# Patient Record
Sex: Female | Born: 1935 | Race: White | Hispanic: No | Smoking: Former smoker
Health system: Southern US, Community
[De-identification: ages and names within clinical notes are randomized; demographics above are authoritative.]

## PROBLEM LIST (undated history)

## (undated) DIAGNOSIS — I639 Cerebral infarction, unspecified: Secondary | ICD-10-CM

## (undated) DIAGNOSIS — I739 Peripheral vascular disease, unspecified: Secondary | ICD-10-CM

## (undated) DIAGNOSIS — I10 Essential (primary) hypertension: Secondary | ICD-10-CM

## (undated) DIAGNOSIS — E039 Hypothyroidism, unspecified: Secondary | ICD-10-CM

## (undated) DIAGNOSIS — H269 Unspecified cataract: Secondary | ICD-10-CM

## (undated) DIAGNOSIS — H547 Unspecified visual loss: Secondary | ICD-10-CM

## (undated) DIAGNOSIS — R0600 Dyspnea, unspecified: Secondary | ICD-10-CM

## (undated) DIAGNOSIS — H532 Diplopia: Secondary | ICD-10-CM

## (undated) DIAGNOSIS — J449 Chronic obstructive pulmonary disease, unspecified: Secondary | ICD-10-CM

## (undated) DIAGNOSIS — E785 Hyperlipidemia, unspecified: Secondary | ICD-10-CM

## (undated) DIAGNOSIS — R29810 Facial weakness: Secondary | ICD-10-CM

## (undated) DIAGNOSIS — Z8619 Personal history of other infectious and parasitic diseases: Secondary | ICD-10-CM

## (undated) HISTORY — DX: Unspecified visual loss: H54.7

## (undated) HISTORY — DX: Diplopia: H53.2

## (undated) HISTORY — DX: Hypothyroidism, unspecified: E03.9

## (undated) HISTORY — DX: Unspecified cataract: H26.9

## (undated) HISTORY — PX: APPENDECTOMY: SHX54

## (undated) HISTORY — DX: Facial weakness: R29.810

## (undated) HISTORY — PX: OTHER SURGICAL HISTORY: SHX169

## (undated) HISTORY — DX: Hyperlipidemia, unspecified: E78.5

## (undated) HISTORY — PX: BREAST LUMPECTOMY: SHX2

## (undated) HISTORY — DX: Essential (primary) hypertension: I10

## (undated) HISTORY — DX: Cerebral infarction, unspecified: I63.9

## (undated) HISTORY — PX: ABDOMINAL EXPLORATION SURGERY: SHX538

## (undated) HISTORY — DX: Personal history of other infectious and parasitic diseases: Z86.19

## (undated) HISTORY — PX: DILATION AND CURETTAGE OF UTERUS: SHX78

## (undated) HISTORY — PX: LAMINECTOMY AND MICRODISCECTOMY SPINE: SHX1914

## (undated) HISTORY — PX: BACK SURGERY: SHX140

---

## 2000-08-25 ENCOUNTER — Other Ambulatory Visit: Admission: RE | Admit: 2000-08-25 | Discharge: 2000-08-25 | Payer: Self-pay | Admitting: Family Medicine

## 2001-10-31 ENCOUNTER — Other Ambulatory Visit: Admission: RE | Admit: 2001-10-31 | Discharge: 2001-10-31 | Payer: Self-pay | Admitting: Family Medicine

## 2003-04-22 ENCOUNTER — Encounter: Payer: Self-pay | Admitting: Family Medicine

## 2003-04-22 LAB — CONVERTED CEMR LAB: Pap Smear: NORMAL

## 2003-05-14 ENCOUNTER — Other Ambulatory Visit: Admission: RE | Admit: 2003-05-14 | Discharge: 2003-05-14 | Payer: Self-pay | Admitting: Family Medicine

## 2003-12-09 ENCOUNTER — Ambulatory Visit: Payer: Self-pay | Admitting: Family Medicine

## 2004-05-22 ENCOUNTER — Encounter: Payer: Self-pay | Admitting: Family Medicine

## 2004-05-22 LAB — CONVERTED CEMR LAB: Pap Smear: NORMAL

## 2004-05-24 ENCOUNTER — Ambulatory Visit: Payer: Self-pay | Admitting: Family Medicine

## 2004-05-26 ENCOUNTER — Ambulatory Visit: Payer: Self-pay | Admitting: Family Medicine

## 2004-05-26 ENCOUNTER — Other Ambulatory Visit: Admission: RE | Admit: 2004-05-26 | Discharge: 2004-05-26 | Payer: Self-pay | Admitting: Family Medicine

## 2004-11-21 LAB — FECAL OCCULT BLOOD, GUAIAC: Fecal Occult Blood: NEGATIVE

## 2004-11-25 ENCOUNTER — Ambulatory Visit: Payer: Self-pay | Admitting: Family Medicine

## 2004-12-13 ENCOUNTER — Ambulatory Visit: Payer: Self-pay | Admitting: Family Medicine

## 2004-12-16 ENCOUNTER — Ambulatory Visit: Payer: Self-pay | Admitting: Family Medicine

## 2005-05-22 ENCOUNTER — Encounter: Payer: Self-pay | Admitting: Family Medicine

## 2005-05-22 LAB — CONVERTED CEMR LAB: Pap Smear: NORMAL

## 2005-05-23 ENCOUNTER — Ambulatory Visit: Payer: Self-pay | Admitting: Family Medicine

## 2005-05-25 ENCOUNTER — Other Ambulatory Visit: Admission: RE | Admit: 2005-05-25 | Discharge: 2005-05-25 | Payer: Self-pay | Admitting: Family Medicine

## 2005-05-25 ENCOUNTER — Encounter: Payer: Self-pay | Admitting: Family Medicine

## 2005-05-25 ENCOUNTER — Ambulatory Visit: Payer: Self-pay | Admitting: Family Medicine

## 2005-10-04 ENCOUNTER — Ambulatory Visit: Payer: Self-pay | Admitting: Family Medicine

## 2005-12-21 ENCOUNTER — Ambulatory Visit: Payer: Self-pay | Admitting: Family Medicine

## 2006-05-23 ENCOUNTER — Encounter: Payer: Self-pay | Admitting: Family Medicine

## 2006-05-29 ENCOUNTER — Ambulatory Visit: Payer: Self-pay | Admitting: Family Medicine

## 2006-05-29 LAB — CONVERTED CEMR LAB
Albumin: 3.8 g/dL (ref 3.5–5.2)
Alkaline Phosphatase: 80 units/L (ref 39–117)
BUN: 9 mg/dL (ref 6–23)
Basophils Absolute: 0 10*3/uL (ref 0.0–0.1)
Cholesterol: 243 mg/dL (ref 0–200)
Creatinine, Ser: 0.8 mg/dL (ref 0.4–1.2)
Direct LDL: 134.9 mg/dL
Eosinophils Absolute: 0.2 10*3/uL (ref 0.0–0.6)
Free T4: 0.5 ng/dL — ABNORMAL LOW (ref 0.6–1.6)
GFR calc Af Amer: 91 mL/min
GFR calc non Af Amer: 75 mL/min
Hemoglobin: 15.9 g/dL — ABNORMAL HIGH (ref 12.0–15.0)
MCHC: 33.8 g/dL (ref 30.0–36.0)
MCV: 97.7 fL (ref 78.0–100.0)
Monocytes Absolute: 0.5 10*3/uL (ref 0.2–0.7)
Monocytes Relative: 9.4 % (ref 3.0–11.0)
Neutrophils Relative %: 58.1 % (ref 43.0–77.0)
Potassium: 4.4 meq/L (ref 3.5–5.1)
RDW: 12.3 % (ref 11.5–14.6)
Sodium: 141 meq/L (ref 135–145)
TSH: 14.35 microintl units/mL — ABNORMAL HIGH (ref 0.35–5.50)
Total Bilirubin: 0.8 mg/dL (ref 0.3–1.2)
Total CHOL/HDL Ratio: 3.1
Triglycerides: 111 mg/dL (ref 0–149)

## 2006-05-31 ENCOUNTER — Other Ambulatory Visit: Admission: RE | Admit: 2006-05-31 | Discharge: 2006-05-31 | Payer: Self-pay | Admitting: Family Medicine

## 2006-05-31 ENCOUNTER — Encounter: Payer: Self-pay | Admitting: Family Medicine

## 2006-05-31 ENCOUNTER — Ambulatory Visit: Payer: Self-pay | Admitting: Family Medicine

## 2006-07-18 ENCOUNTER — Ambulatory Visit: Payer: Self-pay | Admitting: Family Medicine

## 2006-07-18 DIAGNOSIS — I10 Essential (primary) hypertension: Secondary | ICD-10-CM | POA: Insufficient documentation

## 2006-08-22 ENCOUNTER — Encounter: Payer: Self-pay | Admitting: Family Medicine

## 2006-08-23 DIAGNOSIS — E785 Hyperlipidemia, unspecified: Secondary | ICD-10-CM

## 2006-08-23 DIAGNOSIS — E039 Hypothyroidism, unspecified: Secondary | ICD-10-CM | POA: Insufficient documentation

## 2006-08-23 DIAGNOSIS — E782 Mixed hyperlipidemia: Secondary | ICD-10-CM | POA: Insufficient documentation

## 2006-08-23 DIAGNOSIS — F17200 Nicotine dependence, unspecified, uncomplicated: Secondary | ICD-10-CM

## 2006-08-28 ENCOUNTER — Ambulatory Visit: Payer: Self-pay | Admitting: Family Medicine

## 2006-08-28 LAB — CONVERTED CEMR LAB
ALT: 15 units/L (ref 0–35)
Bilirubin, Direct: 0.1 mg/dL (ref 0.0–0.3)
CO2: 31 meq/L (ref 19–32)
Calcium: 9 mg/dL (ref 8.4–10.5)
Free T4: 1 ng/dL (ref 0.6–1.6)
GFR calc Af Amer: 80 mL/min
GFR calc non Af Amer: 66 mL/min
Glucose, Bld: 99 mg/dL (ref 70–99)
Potassium: 4.6 meq/L (ref 3.5–5.1)
Sodium: 143 meq/L (ref 135–145)
Total Protein: 6.6 g/dL (ref 6.0–8.3)

## 2006-08-30 ENCOUNTER — Ambulatory Visit: Payer: Self-pay | Admitting: Family Medicine

## 2006-11-30 ENCOUNTER — Ambulatory Visit: Payer: Self-pay | Admitting: Family Medicine

## 2007-06-04 ENCOUNTER — Encounter: Payer: Self-pay | Admitting: Family Medicine

## 2007-06-04 LAB — CONVERTED CEMR LAB
Albumin: 3.9 g/dL (ref 3.5–5.2)
BUN: 8 mg/dL (ref 6–23)
Calcium: 9.2 mg/dL (ref 8.4–10.5)
Cholesterol: 234 mg/dL (ref 0–200)
Direct LDL: 122.2 mg/dL
Eosinophils Absolute: 0.1 10*3/uL (ref 0.0–0.7)
Eosinophils Relative: 1.9 % (ref 0.0–5.0)
Free T4: 1.1 ng/dL (ref 0.6–1.6)
GFR calc Af Amer: 91 mL/min
GFR calc non Af Amer: 75 mL/min
Glucose, Bld: 79 mg/dL (ref 70–99)
HCT: 47.7 % — ABNORMAL HIGH (ref 36.0–46.0)
Hemoglobin: 15.8 g/dL — ABNORMAL HIGH (ref 12.0–15.0)
MCV: 95.9 fL (ref 78.0–100.0)
Monocytes Absolute: 0.4 10*3/uL (ref 0.1–1.0)
Monocytes Relative: 9.5 % (ref 3.0–12.0)
Neutro Abs: 2.4 10*3/uL (ref 1.4–7.7)
RDW: 12.3 % (ref 11.5–14.6)
TSH: 2.92 microintl units/mL (ref 0.35–5.50)
Total CHOL/HDL Ratio: 2.8
Total Protein: 6.7 g/dL (ref 6.0–8.3)
Triglycerides: 80 mg/dL (ref 0–149)

## 2007-06-07 ENCOUNTER — Ambulatory Visit: Payer: Self-pay | Admitting: Family Medicine

## 2007-06-07 DIAGNOSIS — G459 Transient cerebral ischemic attack, unspecified: Secondary | ICD-10-CM | POA: Insufficient documentation

## 2007-06-14 ENCOUNTER — Encounter: Payer: Self-pay | Admitting: Family Medicine

## 2007-06-14 ENCOUNTER — Ambulatory Visit: Payer: Self-pay | Admitting: Family Medicine

## 2007-06-14 ENCOUNTER — Other Ambulatory Visit: Admission: RE | Admit: 2007-06-14 | Discharge: 2007-06-14 | Payer: Self-pay | Admitting: Family Medicine

## 2007-06-14 LAB — CONVERTED CEMR LAB: Pap Smear: NORMAL

## 2007-06-19 ENCOUNTER — Encounter: Payer: Self-pay | Admitting: Family Medicine

## 2007-06-19 ENCOUNTER — Ambulatory Visit: Payer: Self-pay

## 2007-06-19 HISTORY — PX: DOPPLER ECHOCARDIOGRAPHY: SHX263

## 2007-06-19 HISTORY — PX: OTHER SURGICAL HISTORY: SHX169

## 2007-06-20 ENCOUNTER — Encounter (INDEPENDENT_AMBULATORY_CARE_PROVIDER_SITE_OTHER): Payer: Self-pay | Admitting: *Deleted

## 2007-07-18 ENCOUNTER — Ambulatory Visit: Payer: Self-pay | Admitting: Family Medicine

## 2007-09-19 ENCOUNTER — Ambulatory Visit: Payer: Self-pay | Admitting: Family Medicine

## 2008-09-23 ENCOUNTER — Ambulatory Visit: Payer: Self-pay | Admitting: Family Medicine

## 2008-09-23 LAB — CONVERTED CEMR LAB
ALT: 15 units/L (ref 0–35)
BUN: 13 mg/dL (ref 6–23)
CO2: 30 meq/L (ref 19–32)
Chloride: 107 meq/L (ref 96–112)
Eosinophils Absolute: 0.1 10*3/uL (ref 0.0–0.7)
Eosinophils Relative: 2.4 % (ref 0.0–5.0)
Free T4: 1.1 ng/dL (ref 0.6–1.6)
Glucose, Bld: 95 mg/dL (ref 70–99)
HCT: 48.9 % — ABNORMAL HIGH (ref 36.0–46.0)
Lymphs Abs: 1.3 10*3/uL (ref 0.7–4.0)
MCHC: 34.3 g/dL (ref 30.0–36.0)
MCV: 97.3 fL (ref 78.0–100.0)
Monocytes Absolute: 0.4 10*3/uL (ref 0.1–1.0)
Platelets: 175 10*3/uL (ref 150.0–400.0)
Potassium: 4.9 meq/L (ref 3.5–5.1)
RDW: 12.5 % (ref 11.5–14.6)
TSH: 2.46 microintl units/mL (ref 0.35–5.50)
Total Bilirubin: 0.8 mg/dL (ref 0.3–1.2)
WBC: 5.1 10*3/uL (ref 4.5–10.5)

## 2008-09-30 ENCOUNTER — Encounter: Payer: Self-pay | Admitting: Family Medicine

## 2008-09-30 ENCOUNTER — Other Ambulatory Visit: Admission: RE | Admit: 2008-09-30 | Discharge: 2008-09-30 | Payer: Self-pay | Admitting: Family Medicine

## 2008-09-30 ENCOUNTER — Ambulatory Visit: Payer: Self-pay | Admitting: Family Medicine

## 2008-09-30 LAB — CONVERTED CEMR LAB: Pap Smear: NORMAL

## 2008-09-30 LAB — HM PAP SMEAR

## 2008-10-02 ENCOUNTER — Encounter (INDEPENDENT_AMBULATORY_CARE_PROVIDER_SITE_OTHER): Payer: Self-pay | Admitting: *Deleted

## 2008-10-17 ENCOUNTER — Telehealth (INDEPENDENT_AMBULATORY_CARE_PROVIDER_SITE_OTHER): Payer: Self-pay | Admitting: Internal Medicine

## 2008-10-20 ENCOUNTER — Ambulatory Visit: Payer: Self-pay | Admitting: Family Medicine

## 2008-12-09 ENCOUNTER — Encounter: Payer: Self-pay | Admitting: Family Medicine

## 2008-12-16 ENCOUNTER — Ambulatory Visit: Payer: Self-pay | Admitting: Family Medicine

## 2008-12-16 DIAGNOSIS — J020 Streptococcal pharyngitis: Secondary | ICD-10-CM | POA: Insufficient documentation

## 2009-01-26 ENCOUNTER — Ambulatory Visit: Payer: Self-pay | Admitting: Family Medicine

## 2009-02-23 ENCOUNTER — Ambulatory Visit: Payer: Self-pay | Admitting: Family Medicine

## 2009-02-23 LAB — CONVERTED CEMR LAB: BUN: 7 mg/dL (ref 6–23)

## 2009-03-17 ENCOUNTER — Ambulatory Visit: Payer: Self-pay | Admitting: Family Medicine

## 2009-04-28 ENCOUNTER — Ambulatory Visit: Payer: Self-pay | Admitting: Family Medicine

## 2009-04-28 LAB — CONVERTED CEMR LAB
BUN: 12 mg/dL (ref 6–23)
Calcium: 9.2 mg/dL (ref 8.4–10.5)
Creatinine, Ser: 0.8 mg/dL (ref 0.4–1.2)
GFR calc non Af Amer: 74.74 mL/min (ref >60)

## 2009-06-25 ENCOUNTER — Telehealth: Payer: Self-pay | Admitting: Family Medicine

## 2009-09-23 ENCOUNTER — Encounter (INDEPENDENT_AMBULATORY_CARE_PROVIDER_SITE_OTHER): Payer: Self-pay | Admitting: *Deleted

## 2009-09-24 ENCOUNTER — Encounter (INDEPENDENT_AMBULATORY_CARE_PROVIDER_SITE_OTHER): Payer: Self-pay | Admitting: *Deleted

## 2009-12-15 ENCOUNTER — Encounter: Payer: Self-pay | Admitting: Family Medicine

## 2010-02-21 DIAGNOSIS — Z8619 Personal history of other infectious and parasitic diseases: Secondary | ICD-10-CM

## 2010-02-21 HISTORY — DX: Personal history of other infectious and parasitic diseases: Z86.19

## 2010-03-19 ENCOUNTER — Ambulatory Visit
Admission: RE | Admit: 2010-03-19 | Discharge: 2010-03-19 | Payer: Self-pay | Source: Home / Self Care | Attending: Family Medicine | Admitting: Family Medicine

## 2010-03-19 DIAGNOSIS — B029 Zoster without complications: Secondary | ICD-10-CM | POA: Insufficient documentation

## 2010-03-22 ENCOUNTER — Telehealth: Payer: Self-pay | Admitting: Family Medicine

## 2010-03-25 NOTE — Letter (Signed)
Summary: Nadara Eaton letter  [place] at [facility]  [address] [place], [place] [address]   Phone: [phone]  Fax: [phone]       09/23/2009 MRN: [id number]  [facility] [address] [place], [place]  [address]  Dear Ms. Woodell,  [facility] [place], and [place] announce the retirement of Arta Silence, M.D., from full-time practice at the [facility] office effective August 20, 2009 and his plans of returning part-time.  It is important to Dr. Hetty Ely and to our practice that you understand that [facility] has seven physicians in our office for your health care needs.  We will continue to offer the same exceptional care that you have today.    Dr. Hetty Ely has spoken to many of you about his plans for retirement and returning part-time in the fall.   We will continue to work with you through the transition to schedule appointments for you in the office and meet the high standards that [place] is committed to.   Again, it is with great pleasure that we share the news that Dr. Hetty Ely will return to [facility] at [facility] in October of 2011 with a reduced schedule.    If you have any questions, or would like to request an appointment with one of our physicians, please call us at [phone] and press the option for Scheduling an appointment.  We take pleasure in providing you with excellent patient care and look forward to seeing you at your next office visit.  Our [facility] Physicians are:  Tillman Abide, M.D. Laurita Quint, M.D. Roxy Manns, M.D. Kerby Nora, M.D. Hannah Beat, M.D. Ruthe Mannan, M.D. We proudly welcomed Raechel Ache, M.D. and Eustaquio Boyden, M.D. to the practice in July/August 2011.  Sincerely,   [facility]

## 2010-03-25 NOTE — Miscellaneous (Signed)
Summary: Flu vaccine  Clinical Lists Changes  Observations: Added new observation of FLU VAX: Historical (11/27/2009 16:40)      Influenza Immunization History:    Influenza # 1:  Historical (11/27/2009) Received form from [facility], [address] [place], [place]

## 2010-03-25 NOTE — Progress Notes (Signed)
Summary: Rx Metoprolol  Phone Note Refill Request Call back at [phone] Message from:  [facility] on Jun 25, 2009 11:26 AM  Received refill request for Metoprolol tar 100mg , take one every day. Does not match med sheet. Med sheet shows 1 every am and 1/2 every pm. Office note on 04/28/09 shows 1/2 every am and 1 at night. Please advise.   Method Requested: Electronic Initial call taken by: Sydell Axon LPN,  Jun 26, 4538 11:30 AM  Follow-up for Phone Call        May refill for one year. She should take the 1/2 the same time she is taking her Enalapril and the whole the opposite time to that for two times a day. Follow-up by: Shaune Leeks MD,  Jun 25, 2009 1:31 PM  Additional Follow-up for Phone Call Additional follow up Details #1::        Pt is taking one half in the morning along with her enalapril and one whole one at night.  Med list changed. Additional Follow-up by: Lowella Petties CMA,  Jun 25, 2009 3:57 PM    New/Updated Medications: METOPROLOL TARTRATE 100 MG  TABS (METOPROLOL TARTRATE) Take 1 tab  by mouth every am and 1/2 at night METOPROLOL TARTRATE 100 MG  TABS (METOPROLOL TARTRATE) take one half by mouth in the morning and one whole tablet at night  Prior Medications: METOPROLOL TARTRATE 100 MG  TABS (METOPROLOL TARTRATE) take one half by mouth in the morning and one whole tablet at night SYNTHROID 50 MCG TABS (LEVOTHYROXINE SODIUM) 1 tablet daily by mouth ADULT ASPIRIN EC LOW STRENGTH 81 MG  TBEC (ASPIRIN) 2 daily by mouth ENALAPRIL MALEATE 20 MG TABS (ENALAPRIL MALEATE) Take one by mouth in AM HYDROCHLOROTHIAZIDE 12.5 MG CAPS (HYDROCHLOROTHIAZIDE) Take one tab by mouth in AM Current Allergies: ! CODEINE ! ADULT ASPIRIN LOW STRENGTH ! HYDROCHLOROTHIAZIDE

## 2010-03-25 NOTE — Assessment & Plan Note (Signed)
Summary: ROA  CYD   Vital Signs:  Patient profile:   76 year old female Weight:      114 pounds Temp:     97.9 degrees F oral Pulse rate:   80 / minute Pulse rhythm:   regular BP sitting:   128 / 80  (left arm) Cuff size:   regular  Vitals Entered By: Sydell Axon LPN (April 28, 7844 9:47 AM) CC: Follow-up on BP   History of Present Illness: Pt here for followup of BP, doing well and tolerating her meds w/o problem. She needs some refills.   No complaints today.  Problems Prior to Update: 1)  Strep Throat  (ICD-034.0) 2)  Transient Ischemic Attack  (ICD-435.9) 3)  Nicotine Addiction  (ICD-305.1) 4)  Hypothyroidism  (ICD-244.9) 5)  Hyperlipidemia  (ICD-272.4) 6)  Hypertension, Benign Essential  (ICD-401.1)  Medications Prior to Update: 1)  Metoprolol Tartrate 100 Mg  Tabs (Metoprolol Tartrate) .... Take 1/2 Tab  By Mouth Every Am and 1 At Night 2)  Synthroid 50 Mcg Tabs (Levothyroxine Sodium) .Marland Kitchen.. 1 Tablet Daily By Mouth 3)  Adult Aspirin Ec Low Strength 81 Mg  Tbec (Aspirin) .... 2 Daily By Mouth 4)  Enalapril Maleate 20 Mg Tabs (Enalapril Maleate) .... Take One By Mouth in Am 5)  Hydrochlorothiazide 12.5 Mg Caps (Hydrochlorothiazide) .... One Tab By Mouth in Am  Allergies: 1)  ! Codeine 2)  ! Adult Aspirin Low Strength 3)  ! Hydrochlorothiazide  Physical Exam  General:  Well-developed,well-nourished,in no acute distress; alert,appropriate and cooperative throughout examination Head:  Normocephalic and atraumatic without obvious abnormalities. No apparent alopecia or balding. Eyes:  Conjunctiva clear bilaterally.  Ears:  External ear exam shows no significant lesions or deformities.  Otoscopic examination reveals clear canals, tympanic membranes are intact bilaterally without bulging, retraction, inflammation or discharge. Hearing is grossly normal bilaterally. Nose:  External nasal examination shows no deformity or inflammation. Nasal mucosa are pink and moist without  lesions or exudates. Mouth:  Oral mucosa and oropharynx without lesions or exudates.  False teeth in place. Neck:  No deformities, masses, or tenderness noted. Lungs:  Normal respiratory effort, chest expands symmetrically. Lungs are clear to auscultation, no crackles or wheezes. Heart:  Normal rate and regular rhythm. S1 and S2 normal without gallop, murmur, click, rub or other extra sounds.   Impression & Recommendations:  Problem # 1:  HYPERTENSION, BENIGN ESSENTIAL (ICD-401.1) Assessment Improved Basck to good control. Cont meds as ordered. Bmet today. Her updated medication list for this problem includes:    Metoprolol Tartrate 100 Mg Tabs (Metoprolol tartrate) .Marland Kitchen... Take 1 tab  by mouth every am and 1/2 at night    Enalapril Maleate 20 Mg Tabs (Enalapril maleate) .Marland Kitchen... Take one by mouth in am    Hydrochlorothiazide 12.5 Mg Caps (Hydrochlorothiazide) .Marland Kitchen... Take one tab by mouth in am  Orders: TLB-BMP (Basic Metabolic Panel-BMET) (80048-METABOL)  BP today: 128/80 Prior BP: 148/88 (03/17/2009)  Labs Reviewed: K+: 4.6 (02/23/2009) Creat: : 0.9 (02/23/2009)   Chol: 205 (09/23/2008)   HDL: 73.00 (09/23/2008)   LDL: DEL (06/04/2007)   TG: 97.0 (09/23/2008)  Problem # 2:  HYPOTHYROIDISM (ICD-244.9) Assessment: Unchanged  Cont meds as prescribed. Her updated medication list for this problem includes:    Synthroid 50 Mcg Tabs (Levothyroxine sodium) .Marland Kitchen... 1 tablet daily by mouth  Labs Reviewed: TSH: 2.46 (09/23/2008)    Chol: 205 (09/23/2008)   HDL: 73.00 (09/23/2008)   LDL: DEL (06/04/2007)   TG: 97.0 (  09/23/2008)  Complete Medication List: 1)  Metoprolol Tartrate 100 Mg Tabs (Metoprolol tartrate) .... Take 1 tab  by mouth every am and 1/2 at night 2)  Synthroid 50 Mcg Tabs (Levothyroxine sodium) .Marland Kitchen.. 1 tablet daily by mouth 3)  Adult Aspirin Ec Low Strength 81 Mg Tbec (Aspirin) .... 2 daily by mouth 4)  Enalapril Maleate 20 Mg Tabs (Enalapril maleate) .... Take one by mouth in  am 5)  Hydrochlorothiazide 12.5 Mg Caps (Hydrochlorothiazide) .... Take one tab by mouth in am  Patient Instructions: 1)  RTC 10/11 for Comp Exam. 2)  Will reoprt labs via phone tree. Prescriptions: HYDROCHLOROTHIAZIDE 12.5 MG CAPS (HYDROCHLOROTHIAZIDE) Take one tab by mouth in AM  #90 x 4   Entered and Authorized by:   Shaune Leeks MD   Signed by:   Shaune Leeks MD on 04/28/2009   Method used:   Electronically to        [facility] (retail)       [address]       [place], [place]  [address]       Ph: [phone]       Fax: [phone]   RxID:   [id number] ENALAPRIL MALEATE 20 MG TABS (ENALAPRIL MALEATE) Take one by mouth in AM  #90 x 4   Entered and Authorized by:   Shaune Leeks MD   Signed by:   Shaune Leeks MD on 04/28/2009   Method used:   Electronically to        [facility] (retail)       [address]       [place], [place]  [address]       Ph: [phone]       Fax: [phone]   RxID:   [id number] SYNTHROID 50 MCG TABS (LEVOTHYROXINE SODIUM) 1 tablet daily by mouth  #90 Each x 4   Entered and Authorized by:   Shaune Leeks MD   Signed by:   Shaune Leeks MD on 04/28/2009   Method used:   Electronically to        [facility] (retail)       [address]       [place], [place]  [address]       Ph: [phone]       Fax: [phone]   RxID:   [id number]   Current Allergies (reviewed today): ! CODEINE ! ADULT ASPIRIN LOW STRENGTH ! HYDROCHLOROTHIAZIDE

## 2010-03-25 NOTE — Assessment & Plan Note (Signed)
Summary: ELBOW,WRIST PAIN/CLE   Vital Signs:  Patient profile:   76 year old female Height:      64 inches Weight:      111.50 pounds BMI:     19.21 Temp:     98.7 degrees F oral Pulse rate:   76 / minute Pulse rhythm:   regular BP sitting:   146 / 92  (left arm) Cuff size:   regular  Vitals Entered By: Delilah Shan CMA Sharron Petruska Dull) (March 19, 2010 9:42 AM) CC: Elbow,wrist pain.  Patient says she has taken Aleve for the pain and has taken one Benadryl for the past 2 nights.  Hasn't been able to sleep because of the pain and discomfort.   History of Present Illness: Rash on R arm, itching.  In dermatomal distribution.  Painful at night.  Present for a few days.  No other new symptoms.   Allergies: 1)  ! Codeine 2)  ! Adult Aspirin Low Strength 3)  ! Hydrochlorothiazide  Past History:  Past Medical History: Hyperlipidemia Hypertension Hypothyroidism Shingles 2012  Review of Systems       See HPI.  Otherwise negative.    Physical Exam  General:  no apparent distress  normocephalic atraumatic mucous membranes moist R arm with erythematous rash and vesicles in dermatomal distribution down to 4th and 5 digits on R hand.  NV intact o/w.    Impression & Recommendations:  Problem # 1:  SHINGLES (ICD-053.9) Start valtrex since she is still having new lesions erupt recently and start vicodin for pain- start wiht 1/2 tab and sedation caution given.  D/w patient about shingles and vaccination.  She understood.  Orders: Prescription Created Electronically [phone])  Complete Medication List: 1)  Metoprolol Tartrate 100 Mg Tabs (Metoprolol tartrate) .... Take one half by mouth in the morning and one whole tablet at night 2)  Synthroid 50 Mcg Tabs (Levothyroxine sodium) .Marland Kitchen.. 1 tablet daily by mouth 3)  Adult Aspirin Ec Low Strength 81 Mg Tbec (Aspirin) .... 2 daily by mouth 4)  Enalapril Maleate 20 Mg Tabs (Enalapril maleate) .... Take one by mouth in am 5)  Hydrochlorothiazide  12.5 Mg Caps (Hydrochlorothiazide) .... Take one tab by mouth in am 6)  Aleve 220 Mg Tabs (Naproxen sodium) .... As needed 7)  Benadryl 25 Mg Tabs (Diphenhydramine hcl) .... As needed 8)  Valtrex 1 Gm Tabs (Valacyclovir hcl) .Marland Kitchen.. 1 by mouth three times a day 9)  Vicodin 5-500 Mg Tabs (Hydrocodone-acetaminophen) .... 1/2 to 1 tab by mouth three times a day as needed for pain  Patient Instructions: 1)  Check with your insurance to see if they will cover the shingles shot.  Get the shot when you are feeling better. 2)  Start the valtrex today and take it three times a day.  3)  Use the vicodin for pain.  4)  Take claritin 10mg  a day for itching.  5)  Let me know if you aren't improving.  Prescriptions: VALTREX 1 GM TABS (VALACYCLOVIR HCL) 1 by mouth three times a day  #21 x 0   Entered and Authorized by:   Crawford Givens MD   Signed by:   Crawford Givens MD on 03/19/2010   Method used:   Electronically to        [facility] (retail)       [address]       [place], [place]  [address]  Ph: [phone]       Fax: [phone]   RxID:   [id number] VICODIN 5-500 MG TABS (HYDROCODONE-ACETAMINOPHEN) 1/2 to 1 tab by mouth three times a day as needed for pain  #30 x 0   Entered and Authorized by:   Crawford Givens MD   Signed by:   Crawford Givens MD on 03/19/2010   Method used:   Print then Give to Patient   RxID:   [id number]    Orders Added: 1)  Prescription Created Electronically [G8553] 2)  Est. Patient Level III [[address]]    Current Allergies (reviewed today): ! CODEINE ! ADULT ASPIRIN LOW STRENGTH ! HYDROCHLOROTHIAZIDE

## 2010-03-25 NOTE — Letter (Signed)
Summary: Jill Franco letter  [place] at [facility]  [address] [place], [place] [address]   Phone: [phone]  Fax: [phone]       09/24/2009 MRN: [id number]  [facility] [address] [place], [place]  [address]  Dear Ms. Mcnutt,  [facility] [place], and [place] announce the retirement of Arta Silence, M.D., from full-time practice at the [facility] office effective August 20, 2009 and his plans of returning part-time.  It is important to Dr. Hetty Ely and to our practice that you understand that [facility] has seven physicians in our office for your health care needs.  We will continue to offer the same exceptional care that you have today.    Dr. Hetty Ely has spoken to many of you about his plans for retirement and returning part-time in the fall.   We will continue to work with you through the transition to schedule appointments for you in the office and meet the high standards that [place] is committed to.   Again, it is with great pleasure that we share the news that Dr. Hetty Ely will return to [facility] in October of 2011 with a reduced schedule.    If you have any questions, or would like to request an appointment with one of our physicians, please call us at [phone] and press the option for Scheduling an appointment.  We take pleasure in providing you with excellent patient care and look forward to seeing you at your next office visit.  Our [facility] Physicians are:  Tillman Abide, M.D. Laurita Quint, M.D. Roxy Manns, M.D. Kerby Nora, M.D. Hannah Beat, M.D. Ruthe Mannan, M.D. We proudly welcomed Raechel Ache, M.D. and Eustaquio Boyden, M.D. to the practice in July/August 2011.  Sincerely,  [place] [facility]

## 2010-03-25 NOTE — Assessment & Plan Note (Signed)
Summary: 6 week follow-up/RESCH from 03/09/09   Vital Signs:  Patient profile:   76 year old female Weight:      113.50 pounds Temp:     98.3 degrees F oral Pulse rate:   60 / minute Pulse rhythm:   regular BP sitting:   148 / 88  (left arm) Cuff size:   regular  Vitals Entered By: Sydell Axon LPN (March 17, 2009 8:44 AM) CC: 6 Week follow-up   History of Present Illness: Here for BP check after adding Enalapril to her regimen. Pressure much better but still slightly elevated. She feels well and is tolerating the medicine well.  Problems Prior to Update: 1)  Strep Throat  (ICD-034.0) 2)  Transient Ischemic Attack  (ICD-435.9) 3)  Nicotine Addiction  (ICD-305.1) 4)  Hypothyroidism  (ICD-244.9) 5)  Hyperlipidemia  (ICD-272.4) 6)  Hypertension, Benign Essential  (ICD-401.1)  Medications Prior to Update: 1)  Metoprolol Tartrate 100 Mg  Tabs (Metoprolol Tartrate) .... Take 1 Tab  By Mouth Every Am and 1/2 At Night 2)  Synthroid 50 Mcg Tabs (Levothyroxine Sodium) .Marland Kitchen.. 1 Tablet Daily By Mouth 3)  Adult Aspirin Ec Low Strength 81 Mg  Tbec (Aspirin) .... 2 Daily By Mouth 4)  Enalapril Maleate 20 Mg Tabs (Enalapril Maleate) .... 1/2 Tab By Mouth For One Week and Then Increase To Whole Pill.  Allergies: 1)  ! Codeine 2)  ! Adult Aspirin Low Strength 3)  ! Hydrochlorothiazide  Physical Exam  General:  Well-developed,well-nourished,in no acute distress; alert,appropriate and cooperative throughout examination Head:  Normocephalic and atraumatic without obvious abnormalities. No apparent alopecia or balding. Eyes:  Conjunctiva clear bilaterally.  Ears:  External ear exam shows no significant lesions or deformities.  Otoscopic examination reveals clear canals, tympanic membranes are intact bilaterally without bulging, retraction, inflammation or discharge. Hearing is grossly normal bilaterally. Nose:  External nasal examination shows no deformity or inflammation. Nasal mucosa are pink  and moist without lesions or exudates. Mouth:  Oral mucosa and oropharynx without lesions or exudates.  False teeth in place. Neck:  No deformities, masses, or tenderness noted. Lungs:  Normal respiratory effort, chest expands symmetrically. Lungs are clear to auscultation, no crackles or wheezes. Heart:  Normal rate and regular rhythm. S1 and S2 normal without gallop, murmur, click, rub or other extra sounds.   Impression & Recommendations:  Problem # 1:  HYPERTENSION, BENIGN ESSENTIAL (ICD-401.1) Assessment Improved  But still elevated. Add HCTZ 12.5 in AM Take Enalaoptril in AM Take Metoprolol 1/2 in AM whole at night. Her updated medication list for this problem includes:    Metoprolol Tartrate 100 Mg Tabs (Metoprolol tartrate) .Marland Kitchen... Take 1/2 tab  by mouth every am and 1 at night    Enalapril Maleate 20 Mg Tabs (Enalapril maleate) .Marland Kitchen... Take one by mouth in am    Hydrochlorothiazide 12.5 Mg Caps (Hydrochlorothiazide) ..... One tab by mouth in am  BP today: 148/88 Prior BP: 146/82 (01/26/2009)  Labs Reviewed: K+: 4.6 (02/23/2009) Creat: : 0.9 (02/23/2009)   Chol: 205 (09/23/2008)   HDL: 73.00 (09/23/2008)   LDL: DEL (06/04/2007)   TG: 97.0 (09/23/2008)  Complete Medication List: 1)  Metoprolol Tartrate 100 Mg Tabs (Metoprolol tartrate) .... Take 1/2 tab  by mouth every am and 1 at night 2)  Synthroid 50 Mcg Tabs (Levothyroxine sodium) .Marland Kitchen.. 1 tablet daily by mouth 3)  Adult Aspirin Ec Low Strength 81 Mg Tbec (Aspirin) .... 2 daily by mouth 4)  Enalapril Maleate 20 Mg Tabs (  Enalapril maleate) .... Take one by mouth in am 5)  Hydrochlorothiazide 12.5 Mg Caps (Hydrochlorothiazide) .... One tab by mouth in am  Patient Instructions: 1)  RTC 6 weeks for BP check. 2)  Extend HCTZ next time if BP controlled. 3)  Will need BMET. Prescriptions: HYDROCHLOROTHIAZIDE 12.5 MG CAPS (HYDROCHLOROTHIAZIDE) one tab by mouth in AM  #30 x 1   Entered and Authorized by:   Shaune Leeks MD    Signed by:   Shaune Leeks MD on 03/17/2009   Method used:   Electronically to        [facility] (retail)       [address]       [place], [place]  [address]       Ph: [phone]       Fax: [phone]   RxID:   [id number]   Current Allergies (reviewed today): ! CODEINE ! ADULT ASPIRIN LOW STRENGTH ! HYDROCHLOROTHIAZIDE

## 2010-03-26 ENCOUNTER — Telehealth: Payer: Self-pay | Admitting: Family Medicine

## 2010-03-26 ENCOUNTER — Encounter: Payer: Self-pay | Admitting: Family Medicine

## 2010-03-26 ENCOUNTER — Ambulatory Visit (INDEPENDENT_AMBULATORY_CARE_PROVIDER_SITE_OTHER): Payer: Medicare Other | Admitting: Family Medicine

## 2010-03-26 DIAGNOSIS — B029 Zoster without complications: Secondary | ICD-10-CM

## 2010-03-31 NOTE — Progress Notes (Signed)
Summary: pt wants to change doctors  Phone Note Call from Patient Call back at Home Phone [phone]   Caller: Patient Summary of Call: Pt is asking to change from Dr. Hetty Ely to Dr. Para March.  Please advise. Initial call taken by: Lowella Petties CMA, AAMA,  March 22, 2010 11:42 AM  Follow-up for Phone Call        I am okay with it if [place] agrees.   Follow-up by: Crawford Givens MD,  March 22, 2010 1:29 PM  Additional Follow-up for Phone Call Additional follow up Details #1::        Fine with me. Additional Follow-up by: Shaune Leeks MD,  March 23, 2010 12:59 PM    Additional Follow-up for Phone Call Additional follow up Details #2::    Advised pt, appt made with Dr. Para March Follow-up by: Lowella Petties CMA, AAMA,  March 23, 2010 3:02 PM

## 2010-03-31 NOTE — Assessment & Plan Note (Signed)
Summary: Check arm.   Vital Signs:  Patient profile:   76 year old female Height:      64 inches Weight:      110.25 pounds BMI:     18.99 Temp:     97.9 degrees F oral Pulse rate:   76 / minute Pulse rhythm:   regular BP sitting:   114 / 74  (left arm) Cuff size:   regular  Vitals Entered By: Delilah Shan CMA Duncan Dull) (March 26, 2010 11:51 AM) CC: Check arm for infection.   History of Present Illness: Fu shingles:  Still getting ew lesions on R hand- 5th digit.  Some lesions on hand and arm are scabbing over. Taking 1/2 of a vicodin 6x/day with some relief.  Finished the valtrex.  No other c/o.   Allergies: 1)  ! Codeine 2)  ! Adult Aspirin Low Strength 3)  ! Hydrochlorothiazide  Review of Systems       See HPI.  Otherwise negative.    Physical Exam  General:  no apparent distress but uncomfortable due to R arm R arm with erythematous rash and vesicles in dermatomal distribution down to 4th and 5 digits on R hand.  NV intact o/w.  Smaller red lesions have erupted on the 5th digit.  Other larger lesions are crusting over, white/red/dark red crusts noted- appear to have various ages/stages of healing.  No purulent discharge.  Skin is very sensitive.    Impression & Recommendations:  Problem # 1:  SHINGLES (ICD-053.9) New lesions occuring, so will restart valtrex.  She can increase to 1 tab three times a day of the vicodin.  This may help her with pain control for a longer period.  She isn't drowsy from the medicine.  I gave her an rx to check with pharmacy about the zostavax- she'll check with them about the PA.  Call back as needed.  She may need long term pain control, hopefully w/o opiates.   Complete Medication List: 1)  Metoprolol Tartrate 100 Mg Tabs (Metoprolol tartrate) .... Take one half by mouth in the morning and one whole tablet at night 2)  Synthroid 50 Mcg Tabs (Levothyroxine sodium) .Marland Kitchen.. 1 tablet daily by mouth 3)  Adult Aspirin Ec Low Strength 81 Mg Tbec  (Aspirin) .... 2 daily by mouth 4)  Enalapril Maleate 20 Mg Tabs (Enalapril maleate) .... Take one by mouth in am 5)  Hydrochlorothiazide 12.5 Mg Caps (Hydrochlorothiazide) .... Take one tab by mouth in am 6)  Aleve 220 Mg Tabs (Naproxen sodium) .... As needed 7)  Benadryl 25 Mg Tabs (Diphenhydramine hcl) .... As needed 8)  Vicodin 5-500 Mg Tabs (Hydrocodone-acetaminophen) .... 1/2 to 1 tab by mouth three times a day as needed for pain 9)  Valtrex 1 Gm Tabs (Valacyclovir hcl) .Marland Kitchen.. 1 by mouth three times a day  Patient Instructions: 1)  Keep taking the pain medicine and restart the valtrex.  Let me know if you need more pain medicine.  You can take a whole pain pill three times a day if needed.  See if the pharmacy can help you with the shingles shot. Prescriptions: VICODIN 5-500 MG TABS (HYDROCODONE-ACETAMINOPHEN) 1/2 to 1 tab by mouth three times a day as needed for pain  #30 x 0   Entered and Authorized by:   Crawford Givens MD   Signed by:   Crawford Givens MD on 03/26/2010   Method used:   Print then Give to Patient   RxID:  [id number] VALTREX 1 GM TABS (VALACYCLOVIR HCL) 1 by mouth three times a day  #21 x 0   Entered and Authorized by:   Crawford Givens MD   Signed by:   Crawford Givens MD on 03/26/2010   Method used:   Print then Give to Patient   RxID:   [id number]    Orders Added: 1)  Est. Patient Level III [84696]    Current Allergies (reviewed today): ! CODEINE ! ADULT ASPIRIN LOW STRENGTH ! HYDROCHLOROTHIAZIDE

## 2010-03-31 NOTE — Progress Notes (Signed)
Summary: regarding valtrex  Phone Note Call from Patient Call back at Home Phone [phone]   Caller: Patient Call For: Dr. Para March Summary of Call: Pt states she has finished valtrex for her shingles and she is asking if she should get a refill.  She says this has helped.  Uses [facility]. Initial call taken by: Lowella Petties CMA, AAMA,  March 26, 2010 9:08 AM  Follow-up for Phone Call        I called the patient.  It is unclear based on her symptoms if the lesions are secondarily infected.  I need to check this.  Please call her to get her on the schedule for this AM.   Follow-up by: Crawford Givens MD,  March 26, 2010 11:00 AM  Additional Follow-up for Phone Call Additional follow up Details #1::        Done. Additional Follow-up by: Delilah Shan CMA (AAMA),  March 26, 2010 11:03 AM

## 2010-04-05 ENCOUNTER — Telehealth: Payer: Self-pay | Admitting: Family Medicine

## 2010-04-13 ENCOUNTER — Encounter: Payer: Self-pay | Admitting: Family Medicine

## 2010-04-13 DIAGNOSIS — I10 Essential (primary) hypertension: Secondary | ICD-10-CM

## 2010-04-13 DIAGNOSIS — Z8619 Personal history of other infectious and parasitic diseases: Secondary | ICD-10-CM

## 2010-04-13 DIAGNOSIS — E039 Hypothyroidism, unspecified: Secondary | ICD-10-CM

## 2010-04-13 DIAGNOSIS — E785 Hyperlipidemia, unspecified: Secondary | ICD-10-CM

## 2010-04-14 NOTE — Progress Notes (Signed)
Summary: refill request for vicodin  Phone Note Refill Request Call back at Home Phone [phone] Message from:  Patient  Refills Requested: Medication #1:  VICODIN 5-500 MG TABS 1/2 to 1 tab by mouth three times a day as needed for pain Pt is asking for refill to be sent to [facility].  She is also asking for something for the itching from the shingles she has on her right hand and arm.  She says benedryl is not helping and she is itching badly.  Initial call taken by: Lowella Petties CMA, AAMA,  April 05, 2010 10:17 AM  Follow-up for Phone Call        please call in both rxs. thanks. don't take the hydroxyzine and benadryl together.  sedation caution on the meds.  Follow-up by: Crawford Givens MD,  April 05, 2010 12:17 PM  Additional Follow-up for Phone Call Additional follow up Details #1::        Patient Advised. Medication phoned to pharmacy.  Additional Follow-up by: Delilah Shan CMA (AAMA),  April 05, 2010 12:29 PM    New/Updated Medications: HYDROXYZINE HCL 25 MG TABS (HYDROXYZINE HCL) 1 by mouth every 6 hours as needed for itching, sedation caution Prescriptions: HYDROXYZINE HCL 25 MG TABS (HYDROXYZINE HCL) 1 by mouth every 6 hours as needed for itching, sedation caution  #30 x 1   Entered and Authorized by:   Crawford Givens MD   Signed by:   Crawford Givens MD on 04/05/2010   Method used:   Telephoned to ...       [facility]       [address]       [place], [place]  [address]       Ph: [phone]       Fax: [phone]   RxID:   [id number] VICODIN 5-500 MG TABS (HYDROCODONE-ACETAMINOPHEN) 1/2 to 1 tab by mouth three times a day as needed for pain  #30 x 1   Entered and Authorized by:   Crawford Givens MD   Signed by:   Crawford Givens MD on 04/05/2010   Method used:   Telephoned to ...       [facility]       [address]       [place], [place]  [address]       Ph:  [phone]       Fax: [phone]   RxID:   [id number]

## 2010-04-28 ENCOUNTER — Telehealth: Payer: Self-pay | Admitting: Family Medicine

## 2010-05-04 NOTE — Progress Notes (Signed)
Summary: wants something for shingles pain  Phone Note Call from Patient Call back at Home Phone [phone]   Caller: Patient Summary of Call: Pt states she still has about 3 shingles spots left on her right hand.  She states this is causing a lot of pain and she is out of vicodin.  She is asking for something for pain, states vicodin made her sick on her stomach.  Uses [facility]. Initial call taken by: Lowella Petties CMA, AAMA,  April 28, 2010 10:01 AM  Follow-up for Phone Call        vicodin caused nausea.  may try tramadol for pain, sedation precautions.  if not improved could try gabapentin vs lidoderm patch vs capsacin? Follow-up by: Eustaquio Boyden  MD,  April 28, 2010 11:53 AM  Additional Follow-up for Phone Call Additional follow up Details #1::        Patient notified and will try the Tramadol and call back if that doesn't help. She verbalized understanding of sedation precautions.  Additional Follow-up by: Janee Morn CMA Duncan Dull),  April 28, 2010 12:03 PM    New/Updated Medications: TRAMADOL HCL 50 MG TABS (TRAMADOL HCL) take 1/2 to 1 pill three times a day as needed pain, sedation precautions Prescriptions: TRAMADOL HCL 50 MG TABS (TRAMADOL HCL) take 1/2 to 1 pill three times a day as needed pain, sedation precautions  #30 x 0   Entered and Authorized by:   Eustaquio Boyden  MD   Signed by:   Eustaquio Boyden  MD on 04/28/2010   Method used:   Electronically to        [facility])       [address]       [place], [place]  [address]       Ph: [phone]       Fax: [phone]   RxID:   [id number]

## 2010-05-19 ENCOUNTER — Other Ambulatory Visit: Payer: Self-pay | Admitting: Family Medicine

## 2010-05-19 DIAGNOSIS — I10 Essential (primary) hypertension: Secondary | ICD-10-CM

## 2010-05-19 DIAGNOSIS — E039 Hypothyroidism, unspecified: Secondary | ICD-10-CM

## 2010-05-22 ENCOUNTER — Other Ambulatory Visit: Payer: Self-pay | Admitting: Family Medicine

## 2010-05-24 ENCOUNTER — Other Ambulatory Visit (INDEPENDENT_AMBULATORY_CARE_PROVIDER_SITE_OTHER): Payer: Medicare Other | Admitting: Family Medicine

## 2010-05-24 DIAGNOSIS — I10 Essential (primary) hypertension: Secondary | ICD-10-CM

## 2010-05-24 DIAGNOSIS — E039 Hypothyroidism, unspecified: Secondary | ICD-10-CM

## 2010-05-24 LAB — COMPREHENSIVE METABOLIC PANEL
ALT: 20 U/L (ref 0–35)
AST: 20 U/L (ref 0–37)
BUN: 11 mg/dL (ref 6–23)
Calcium: 8.9 mg/dL (ref 8.4–10.5)
Chloride: 99 mEq/L (ref 96–112)
Creatinine, Ser: 0.7 mg/dL (ref 0.4–1.2)
Total Bilirubin: 0.6 mg/dL (ref 0.3–1.2)

## 2010-05-24 LAB — TSH: TSH: 1.52 u[IU]/mL (ref 0.35–5.50)

## 2010-05-24 LAB — LIPID PANEL
HDL: 61.4 mg/dL (ref >39.00)
Total CHOL/HDL Ratio: 3
Triglycerides: 105 mg/dL (ref 0.0–149.0)
VLDL: 21 mg/dL (ref 0.0–40.0)

## 2010-05-27 ENCOUNTER — Encounter: Payer: Self-pay | Admitting: Family Medicine

## 2010-05-31 ENCOUNTER — Ambulatory Visit (INDEPENDENT_AMBULATORY_CARE_PROVIDER_SITE_OTHER): Payer: Medicare Other | Admitting: Family Medicine

## 2010-05-31 ENCOUNTER — Encounter: Payer: Self-pay | Admitting: Family Medicine

## 2010-05-31 DIAGNOSIS — F17200 Nicotine dependence, unspecified, uncomplicated: Secondary | ICD-10-CM

## 2010-05-31 DIAGNOSIS — IMO0002 Reserved for concepts with insufficient information to code with codable children: Secondary | ICD-10-CM

## 2010-05-31 DIAGNOSIS — E039 Hypothyroidism, unspecified: Secondary | ICD-10-CM

## 2010-05-31 DIAGNOSIS — X58XXXA Exposure to other specified factors, initial encounter: Secondary | ICD-10-CM

## 2010-05-31 DIAGNOSIS — Z0000 Encounter for general adult medical examination without abnormal findings: Secondary | ICD-10-CM

## 2010-05-31 DIAGNOSIS — B029 Zoster without complications: Secondary | ICD-10-CM

## 2010-05-31 DIAGNOSIS — T148XXA Other injury of unspecified body region, initial encounter: Secondary | ICD-10-CM

## 2010-05-31 DIAGNOSIS — I10 Essential (primary) hypertension: Secondary | ICD-10-CM

## 2010-05-31 DIAGNOSIS — Z1211 Encounter for screening for malignant neoplasm of colon: Secondary | ICD-10-CM

## 2010-05-31 MED ORDER — HYDROXYZINE HCL 25 MG PO TABS: 25.0000 mg | ORAL_TABLET | Freq: Four times a day (QID) | ORAL | Status: DC | PRN

## 2010-05-31 MED ORDER — TETANUS-DIPHTH-ACELL PERTUSSIS 5-2.5-18.5 LF-MCG/0.5 IM SUSP
0.5000 mL | Freq: Once | INTRAMUSCULAR | Status: AC
  Administered 2010-05-31: 0.5 mL via INTRAMUSCULAR

## 2010-05-31 MED ORDER — METOPROLOL TARTRATE 100 MG PO TABS: ORAL_TABLET | ORAL | Status: DC

## 2010-05-31 NOTE — Assessment & Plan Note (Signed)
Not ready to quit smoking. 

## 2010-05-31 NOTE — Progress Notes (Signed)
Hypertension:    Using medication without problems or lightheadedness:yes  Chest pain with exertion:no Edema:no Short of breath:no Average home BPs: ~120/~80  Shingles- resolving but still with some sensitivity and itching on the R hand, ulnar distribution. We talked about the vaccine today.    Hypothyroid- Still on meds and labs reviewed with patient.   Laceration on L medial shin, 1 week ago.  Due for tetanus.  Not painful.  She cleaned it right after the cut.  Using neosporin in meantime.   Screening for colon cancer. D/w pt.  See plan.   Meds, vitals, and allergies reviewed.   PMH and SH reviewed  ROS: See HPI.  Otherwise negative.    GEN: nad, alert and oriented HEENT: mucous membranes moist NECK: supple w/o LA CV: rrr. PULM: ctab, no inc wob ABD: soft, +bs EXT: no edema SKIN: no acute rash but resolving chronic changes noted on R arm, ulnar distribution.  Skin in that area is sensitive.  2cm V shaped lac on L medial shin, appears to be healing w/o spreading erythema.  No purulent discharge.  Recovered with neosporin and nonstick bandage.

## 2010-05-31 NOTE — Assessment & Plan Note (Signed)
D/w patient re:options for colon cancer screening, including IFOB vs. colonoscopy.  Risks and benefits of both were discussed and patient voiced understanding.  Pt elects for:IFOB  

## 2010-05-31 NOTE — Assessment & Plan Note (Addendum)
Continue prn atarax for itching.  It doesn't sedate her.  She'll check on the vaccine.

## 2010-05-31 NOTE — Patient Instructions (Signed)
Don't change your meds.  Keep the cut covered with neosporin and let me know if it gets redder or starts to drain pus.  Check on the shingles shot.  I sent in your meds.  The atarax/hydroxyzine can help with the itching but can make you drowsy.  Take care.  I'd like to check back in 6 months to see about your blood pressure.

## 2010-05-31 NOTE — Assessment & Plan Note (Signed)
Healing.  Tetanus today and fu prn.

## 2010-05-31 NOTE — Assessment & Plan Note (Signed)
TSH wnl, no change in meds.  

## 2010-05-31 NOTE — Assessment & Plan Note (Signed)
BP controlled, no change in meds.  

## 2010-06-25 ENCOUNTER — Other Ambulatory Visit (INDEPENDENT_AMBULATORY_CARE_PROVIDER_SITE_OTHER): Payer: Medicare Other | Admitting: Family Medicine

## 2010-06-25 ENCOUNTER — Other Ambulatory Visit: Payer: Medicare Other

## 2010-06-25 DIAGNOSIS — Z1211 Encounter for screening for malignant neoplasm of colon: Secondary | ICD-10-CM

## 2010-06-30 ENCOUNTER — Encounter: Payer: Self-pay | Admitting: *Deleted

## 2010-07-21 ENCOUNTER — Other Ambulatory Visit: Payer: Self-pay | Admitting: Family Medicine

## 2010-10-21 ENCOUNTER — Other Ambulatory Visit: Payer: Self-pay | Admitting: Family Medicine

## 2010-12-06 ENCOUNTER — Ambulatory Visit (INDEPENDENT_AMBULATORY_CARE_PROVIDER_SITE_OTHER): Payer: Medicare Other | Admitting: Family Medicine

## 2010-12-06 ENCOUNTER — Encounter: Payer: Self-pay | Admitting: Family Medicine

## 2010-12-06 VITALS — BP 130/88 | HR 56 | Temp 98.1°F | Wt 108.0 lb

## 2010-12-06 DIAGNOSIS — F17200 Nicotine dependence, unspecified, uncomplicated: Secondary | ICD-10-CM

## 2010-12-06 DIAGNOSIS — B029 Zoster without complications: Secondary | ICD-10-CM

## 2010-12-06 DIAGNOSIS — Z23 Encounter for immunization: Secondary | ICD-10-CM

## 2010-12-06 DIAGNOSIS — I10 Essential (primary) hypertension: Secondary | ICD-10-CM

## 2010-12-06 NOTE — Progress Notes (Signed)
Post shingles neuralgia.  Her hand is better- some itching from the shingles- but no rash and the pain is resolved.    Hypertension:    Using medication without problems or lightheadedness: yes Chest pain with exertion:no Edema:no Short of breath:no Average home BPs: ~130s/unrecalled.    Smoking: 1 pack "every few days", this is less than prev.  We talked about cessation today.  She isn't ready to totally quit. Has tried to quit prev.    Meds, vitals, and allergies reviewed.   PMH and SH reviewed  ROS: See HPI.  She had a recent URI w/o fever and is feeling better.  Still with a little cough left over, but overall improved. She was asking about flu shot.   Otherwise negative.    GEN: nad, alert and oriented, thin, nad HEENT: mucous membranes moist NECK: supple w/o LA CV: rrr. PULM: ctab, no inc wob ABD: soft, +bs EXT: no edema SKIN: no acute rash, R hand with prev skin hypersensitivity much improved.

## 2010-12-06 NOTE — Patient Instructions (Signed)
Don't change your meds.  If you have any concerns, then let me know. Otherwise schedule a physical for April 2013.  Take care.

## 2010-12-07 ENCOUNTER — Encounter: Payer: Self-pay | Admitting: Family Medicine

## 2010-12-07 NOTE — Assessment & Plan Note (Signed)
With improved post-shingles neuralgia.  No change in meds. She has shingles vaccine.

## 2010-12-07 NOTE — Assessment & Plan Note (Signed)
Continue current meds, no change.  She'll monitor pulse in case of brady and notify the clinic.

## 2010-12-07 NOTE — Assessment & Plan Note (Signed)
Discussed cessation, she isn't ready to quit fully.  Will follow this in clinic.

## 2011-05-18 ENCOUNTER — Other Ambulatory Visit: Payer: Self-pay

## 2011-05-18 MED ORDER — LEVOTHYROXINE SODIUM 50 MCG PO TABS: 50.0000 ug | ORAL_TABLET | Freq: Every day | ORAL | Status: DC

## 2011-05-18 NOTE — Telephone Encounter (Signed)
[facility] faxed request synthroid 50 mcg #90 x 3. Pt last seen 12/06/10.

## 2011-06-07 ENCOUNTER — Ambulatory Visit (INDEPENDENT_AMBULATORY_CARE_PROVIDER_SITE_OTHER): Payer: Medicare Other | Admitting: Family Medicine

## 2011-06-07 ENCOUNTER — Encounter: Payer: Self-pay | Admitting: Family Medicine

## 2011-06-07 ENCOUNTER — Encounter: Payer: Self-pay | Admitting: *Deleted

## 2011-06-07 VITALS — BP 132/72 | Temp 97.7°F | Wt 110.8 lb

## 2011-06-07 DIAGNOSIS — I10 Essential (primary) hypertension: Secondary | ICD-10-CM

## 2011-06-07 DIAGNOSIS — E039 Hypothyroidism, unspecified: Secondary | ICD-10-CM

## 2011-06-07 DIAGNOSIS — Z0000 Encounter for general adult medical examination without abnormal findings: Secondary | ICD-10-CM | POA: Insufficient documentation

## 2011-06-07 DIAGNOSIS — E785 Hyperlipidemia, unspecified: Secondary | ICD-10-CM

## 2011-06-07 DIAGNOSIS — Z1211 Encounter for screening for malignant neoplasm of colon: Secondary | ICD-10-CM

## 2011-06-07 LAB — COMPREHENSIVE METABOLIC PANEL
ALT: 23 U/L (ref 0–35)
AST: 20 U/L (ref 0–37)
Albumin: 4 g/dL (ref 3.5–5.2)
Alkaline Phosphatase: 82 U/L (ref 39–117)
BUN: 12 mg/dL (ref 6–23)
Potassium: 4.2 mEq/L (ref 3.5–5.1)
Sodium: 139 mEq/L (ref 135–145)

## 2011-06-07 LAB — LIPID PANEL
HDL: 82.4 mg/dL (ref >39.00)
Total CHOL/HDL Ratio: 3
Triglycerides: 86 mg/dL (ref 0.0–149.0)

## 2011-06-07 LAB — LDL CHOLESTEROL, DIRECT: Direct LDL: 110.3 mg/dL

## 2011-06-07 MED ORDER — ENALAPRIL MALEATE 20 MG PO TABS: 20.0000 mg | ORAL_TABLET | Freq: Every day | ORAL | Status: DC

## 2011-06-07 MED ORDER — LEVOTHYROXINE SODIUM 50 MCG PO TABS: 50.0000 ug | ORAL_TABLET | Freq: Every day | ORAL | Status: DC

## 2011-06-07 MED ORDER — HYDROCHLOROTHIAZIDE 12.5 MG PO CAPS: 12.5000 mg | ORAL_CAPSULE | ORAL | Status: DC

## 2011-06-07 MED ORDER — METOPROLOL TARTRATE 100 MG PO TABS: ORAL_TABLET | ORAL | Status: DC

## 2011-06-07 NOTE — Assessment & Plan Note (Signed)
Continue current meds.  check TSH today.

## 2011-06-07 NOTE — Assessment & Plan Note (Signed)
H/o, check labs today.  Continue active lifestyle.

## 2011-06-07 NOTE — Patient Instructions (Addendum)
Check your schedule and see about getting a mammogram.  Let us know if you need help getting it set up.   We'll send you for the bone density if you change you mind.  Go to the lab on the way out.  We'll contact you with your lab report. Take care.  Let me know if I can help with the smoking.   I would get a flu shot each fall.

## 2011-06-07 NOTE — Assessment & Plan Note (Signed)
Controlled.  Continue current meds.  check labs today.  Continue active lifestyle.

## 2011-06-07 NOTE — Progress Notes (Signed)
I have personally reviewed the Medicare Annual Wellness questionnaire and have noted 1. The patient's medical and social history 2. Their use of alcohol, tobacco or illicit drugs 3. Their current medications and supplements 4. The patient's functional ability including ADL's, fall risks, home safety risks and hearing or visual             impairment. 5. Diet and physical activities 6. Evidence for depression or mood disorders  The patients weight, height, BMI and visual acuity have been recorded in the chart I have made referrals, counseling and provided education to the patient based review of the above and I have provided the pt with a written personalized care plan for preventive services.  Routine anticipatory guidance given to patient.  See health maintenance. Td 2012 PNA 2008 Flu 2012 Shingles 2012 Colon cancer screening. D/w patient [id number] for colon cancer screening, including IFOB vs. colonoscopy.  Risks and benefits of both were discussed and patient voiced understanding.  Pt elects for: IFOB.   Pap not indicated.  No h/o abnormal paps.  Mammogram: due. Discussed.  She misses last year though it was set up.  She wants to check her schedule.  Dxa: we discussed.  She wanted to defer.  We talked about smoking cessation and prevention.  Precontemplative about stopping smoking.   AD d/w pt.  Marylu Lund her daughter is Product manager.  She has talked with her daughter.    Hypertension:    Using medication without problems or lightheadedness: yes Chest pain with exertion:no Edema:no Short of breath:no  Hypothyroid.  No ADE.  No neck mass, dysphagia or sx or under/over replacement.    PMH and SH reviewed  Meds, vitals, and allergies reviewed.   ROS: See HPI.  Otherwise negative.    GEN: nad, alert and oriented HEENT: mucous membranes moist NECK: supple w/o LA, no TMG CV: rrr. PULM: ctab, no inc wob ABD: soft, +bs EXT: no edema SKIN: no acute rash

## 2011-11-09 ENCOUNTER — Ambulatory Visit: Payer: Medicare Other

## 2012-05-14 ENCOUNTER — Telehealth: Payer: Self-pay | Admitting: *Deleted

## 2012-05-14 NOTE — Telephone Encounter (Signed)
Okay to make the substitution and then continue with the new manufacturer.  No change in sig.  Please send 90d supply and 3 rf. Thanks.

## 2012-05-14 NOTE — Telephone Encounter (Signed)
Levothyroxine (Synthroid, Levothroid) 50 mcg tablet. May we disperse item from a different manufacturer?  This is a NTI drug and we need your permission.  Fax is in your in box.

## 2012-05-15 NOTE — Telephone Encounter (Signed)
Called info to pharmacy.

## 2012-06-19 ENCOUNTER — Encounter: Payer: Self-pay | Admitting: Family Medicine

## 2012-06-19 ENCOUNTER — Ambulatory Visit (INDEPENDENT_AMBULATORY_CARE_PROVIDER_SITE_OTHER): Payer: Medicare Other | Admitting: Family Medicine

## 2012-06-19 VITALS — BP 142/68 | HR 59 | Temp 97.4°F | Ht 65.0 in | Wt 111.2 lb

## 2012-06-19 DIAGNOSIS — Z1211 Encounter for screening for malignant neoplasm of colon: Secondary | ICD-10-CM

## 2012-06-19 DIAGNOSIS — E039 Hypothyroidism, unspecified: Secondary | ICD-10-CM

## 2012-06-19 DIAGNOSIS — Z0000 Encounter for general adult medical examination without abnormal findings: Secondary | ICD-10-CM

## 2012-06-19 DIAGNOSIS — I10 Essential (primary) hypertension: Secondary | ICD-10-CM

## 2012-06-19 LAB — COMPREHENSIVE METABOLIC PANEL
AST: 25 U/L (ref 0–37)
Albumin: 4 g/dL (ref 3.5–5.2)
Alkaline Phosphatase: 70 U/L (ref 39–117)
BUN: 17 mg/dL (ref 6–23)
Creatinine, Ser: 0.9 mg/dL (ref 0.4–1.2)
Potassium: 4.1 mEq/L (ref 3.5–5.1)

## 2012-06-19 LAB — LDL CHOLESTEROL, DIRECT: Direct LDL: 115.8 mg/dL

## 2012-06-19 LAB — LIPID PANEL
HDL: 82.2 mg/dL (ref >39.00)
VLDL: 24.2 mg/dL (ref 0.0–40.0)

## 2012-06-19 MED ORDER — ENALAPRIL MALEATE 20 MG PO TABS: 20.0000 mg | ORAL_TABLET | Freq: Every day | ORAL | Status: DC

## 2012-06-19 MED ORDER — HYDROCHLOROTHIAZIDE 12.5 MG PO CAPS: 12.5000 mg | ORAL_CAPSULE | ORAL | Status: DC

## 2012-06-19 MED ORDER — METOPROLOL TARTRATE 100 MG PO TABS: ORAL_TABLET | ORAL | Status: DC

## 2012-06-19 MED ORDER — LEVOTHYROXINE SODIUM 50 MCG PO TABS: 50.0000 ug | ORAL_TABLET | Freq: Every day | ORAL | Status: DC

## 2012-06-19 NOTE — Assessment & Plan Note (Signed)
Continue current meds.  See notes on labs.   

## 2012-06-19 NOTE — Assessment & Plan Note (Signed)
Routine anticipatory guidance given to patient. See health maintenance.  Td 2012  PNA 2008  Flu 2013  Shingles 2012  Colon cancer screening. D/w patient [name] for colon cancer screening, including IFOB vs. colonoscopy. Risks and benefits of both were discussed and patient voiced understanding. Pt elects for: IFOB.  Pap not indicated. No h/o abnormal paps.  Mammogram: due. Prev done at [facility]. She'll call about this.  Dxa: We discussed. She wanted to defer. We talked about smoking cessation and prevention.  AD d/w pt. Jill Franco her daughter is Product manager. She has talked with her daughter.  See scanned forms.  Cognitive function addressed- see scanned forms- and if abnormal then additional documentation follows.

## 2012-06-19 NOTE — Patient Instructions (Addendum)
Go to the lab on the way out.  We'll contact you with your lab report.  I would get a flu shot each fall.   Call about getting a mammogram and an appointment at the eye clinic.  Take care. Glad to see you.

## 2012-06-19 NOTE — Progress Notes (Signed)
I have personally reviewed the Medicare Annual Wellness questionnaire and have noted 1. The patient's medical and social history 2. Their use of alcohol, tobacco or illicit drugs 3. Their current medications and supplements 4. The patient's functional ability including ADL's, fall risks, home safety risks and hearing or visual             impairment. 5. Diet and physical activities 6. Evidence for depression or mood disorders  The patients weight, height, BMI have been recorded in the chart and visual acuity is per eye clinic.  I have made referrals, counseling and provided education to the patient based review of the above and I have provided the pt with a written personalized care plan for preventive services.  Routine anticipatory guidance given to patient. See health maintenance. Td 2012  PNA 2008  Flu 2013  Shingles 2012  Colon cancer screening. D/w patient [name] for colon cancer screening, including IFOB vs. colonoscopy. Risks and benefits of both were discussed and patient voiced understanding. Pt elects for: IFOB.  Pap not indicated. No h/o abnormal paps.  Mammogram: due. Prev done at [facility].  She'll call about this.  Dxa: We discussed. She wanted to defer. We talked about smoking cessation and prevention.  AD d/w pt. Marylu Lund her daughter is Product manager. She has talked with her daughter.  See scanned forms.  Cognitive function addressed- see scanned forms- and if abnormal then additional documentation follows.   Hypothyroid.  No neck mass, no dysphagia.  Energy level is good.  Due for TSH.   Hypertension:    Using medication without problems or lightheadedness: yes Chest pain with exertion:no Edema:no Short of breath:no  PMH and SH reviewed  Meds, vitals, and allergies reviewed.   ROS: See HPI.  Otherwise negative.    GEN: nad, alert and oriented HEENT: mucous membranes moist NECK: supple w/o LA, no TMG on exam CV: rrr. PULM: ctab, no inc wob ABD: soft, +bs EXT: no  edema SKIN: no acute rash

## 2012-06-20 ENCOUNTER — Encounter: Payer: Self-pay | Admitting: *Deleted

## 2012-06-26 ENCOUNTER — Other Ambulatory Visit (INDEPENDENT_AMBULATORY_CARE_PROVIDER_SITE_OTHER): Payer: Medicare Other

## 2012-06-26 DIAGNOSIS — Z1211 Encounter for screening for malignant neoplasm of colon: Secondary | ICD-10-CM

## 2012-06-26 LAB — FECAL OCCULT BLOOD, IMMUNOCHEMICAL: Fecal Occult Bld: NEGATIVE

## 2012-06-28 ENCOUNTER — Encounter: Payer: Self-pay | Admitting: *Deleted

## 2012-12-11 ENCOUNTER — Ambulatory Visit (INDEPENDENT_AMBULATORY_CARE_PROVIDER_SITE_OTHER): Payer: Medicare Other

## 2012-12-11 DIAGNOSIS — Z23 Encounter for immunization: Secondary | ICD-10-CM

## 2013-02-26 ENCOUNTER — Other Ambulatory Visit: Payer: Self-pay | Admitting: Family Medicine

## 2013-02-26 MED ORDER — LEVOTHYROXINE SODIUM 50 MCG PO TABS: 50.0000 ug | ORAL_TABLET | Freq: Every day | ORAL | Status: DC

## 2013-02-26 MED ORDER — HYDROCHLOROTHIAZIDE 12.5 MG PO CAPS: 12.5000 mg | ORAL_CAPSULE | ORAL | Status: DC

## 2013-02-26 MED ORDER — METOPROLOL TARTRATE 100 MG PO TABS: ORAL_TABLET | ORAL | Status: DC

## 2013-02-26 MED ORDER — ENALAPRIL MALEATE 20 MG PO TABS: 20.0000 mg | ORAL_TABLET | Freq: Every day | ORAL | Status: DC

## 2013-06-21 ENCOUNTER — Ambulatory Visit (INDEPENDENT_AMBULATORY_CARE_PROVIDER_SITE_OTHER): Payer: Commercial Managed Care - HMO | Admitting: Family Medicine

## 2013-06-21 ENCOUNTER — Encounter: Payer: Self-pay | Admitting: Family Medicine

## 2013-06-21 ENCOUNTER — Encounter: Payer: Self-pay | Admitting: *Deleted

## 2013-06-21 VITALS — BP 142/82 | HR 65 | Temp 97.4°F | Ht 66.0 in | Wt 113.8 lb

## 2013-06-21 DIAGNOSIS — Z1211 Encounter for screening for malignant neoplasm of colon: Secondary | ICD-10-CM

## 2013-06-21 DIAGNOSIS — Z0000 Encounter for general adult medical examination without abnormal findings: Secondary | ICD-10-CM

## 2013-06-21 DIAGNOSIS — E039 Hypothyroidism, unspecified: Secondary | ICD-10-CM

## 2013-06-21 DIAGNOSIS — I10 Essential (primary) hypertension: Secondary | ICD-10-CM

## 2013-06-21 LAB — COMPREHENSIVE METABOLIC PANEL
ALT: 20 U/L (ref 0–35)
AST: 25 U/L (ref 0–37)
Albumin: 3.8 g/dL (ref 3.5–5.2)
Alkaline Phosphatase: 64 U/L (ref 39–117)
BILIRUBIN TOTAL: 0.7 mg/dL (ref 0.3–1.2)
BUN: 16 mg/dL (ref 6–23)
CO2: 28 meq/L (ref 19–32)
Calcium: 9.4 mg/dL (ref 8.4–10.5)
Chloride: 101 mEq/L (ref 96–112)
Creatinine, Ser: 0.9 mg/dL (ref 0.4–1.2)
GFR: 61.36 mL/min (ref >60.00)
GLUCOSE: 90 mg/dL (ref 70–99)
Potassium: 4.2 mEq/L (ref 3.5–5.1)
SODIUM: 137 meq/L (ref 135–145)
TOTAL PROTEIN: 6.5 g/dL (ref 6.0–8.3)

## 2013-06-21 LAB — LIPID PANEL
CHOLESTEROL: 213 mg/dL — AB (ref 0–200)
HDL: 79.2 mg/dL (ref >39.00)
LDL Cholesterol: 116 mg/dL — ABNORMAL HIGH (ref 0–99)
Total CHOL/HDL Ratio: 3
Triglycerides: 87 mg/dL (ref 0.0–149.0)
VLDL: 17.4 mg/dL (ref 0.0–40.0)

## 2013-06-21 LAB — TSH: TSH: 1.31 u[IU]/mL (ref 0.35–5.50)

## 2013-06-21 NOTE — Assessment & Plan Note (Signed)
Reasonable control, continue as is, labs pending. She agrees with plan.

## 2013-06-21 NOTE — Progress Notes (Signed)
Pre visit review using our clinic review tool, if applicable. No additional management support is needed unless otherwise documented below in the visit note.  CPE- See plan.  Routine anticipatory guidance given to patient.  See health maintenance. Tetanus 2006 PNA 2008 Flu shot 2014 Shingles 2012 Pap smear not indicated.  D/w patient [id number] for colon cancer screening, including IFOB vs. colonoscopy.  Risks and benefits of both were discussed and patient voiced understanding.  Pt elects for: IFOB.   Mammogram due.  D/w pt.  DXA d/w pt.  She'll consider and let me know.  Living will d/w pt, encouraged.  Daughter designated if patient were incapacitated.  Diet and exercise d/w pt.  Working in the yard a lot, less in the winter.  Diet is good.   Hypertension:    Using medication without problems or lightheadedness: yes Chest pain with exertion:no Edema:no Short of breath:no  Hypothyroid.  Due for labs.  Compliant.  No neck mass.  No dysphagia.    PMH and SH reviewed  Meds, vitals, and allergies reviewed.   ROS: See HPI.  Otherwise negative.    GEN: nad, alert and oriented HEENT: mucous membranes moist NECK: supple w/o LA, no TMG.  CV: rrr. PULM: ctab, no inc wob ABD: soft, +bs EXT: no edema SKIN: no acute rash

## 2013-06-21 NOTE — Assessment & Plan Note (Signed)
Routine anticipatory guidance given to patient.  See health maintenance. Tetanus 2006 PNA 2008 Flu shot 2014 Shingles 2012 Pap smear not indicated.  D/w patient [name] for colon cancer screening, including IFOB vs. colonoscopy.  Risks and benefits of both were discussed and patient voiced understanding.  Pt elects for: IFOB.   Mammogram due.  D/w pt.  DXA d/w pt.  She'll consider and let me know.  Living will d/w pt, encouraged.  Daughter designated if patient were incapacitated.  Diet and exercise d/w pt.  Working in the yard a lot, less in the winter.  Diet is good.

## 2013-06-21 NOTE — Assessment & Plan Note (Signed)
No TMG on exam. Continue as is assuming TSH wnl.  Labs pending.  She agrees.

## 2013-06-21 NOTE — Patient Instructions (Addendum)
Go to the lab on the way out.  We'll contact you with your lab report. Take care.  Glad to see you.  Call about a mammogram.   Let me know if you want to go for a bone density test.

## 2013-06-24 ENCOUNTER — Telehealth: Payer: Self-pay | Admitting: Family Medicine

## 2013-06-24 NOTE — Telephone Encounter (Signed)
Relevant patient education assigned to patient using Emmi. ° °

## 2013-06-24 NOTE — Telephone Encounter (Signed)
Relevant patient education mailed to patient.  

## 2013-07-05 ENCOUNTER — Encounter: Payer: Self-pay | Admitting: *Deleted

## 2013-07-05 ENCOUNTER — Other Ambulatory Visit (INDEPENDENT_AMBULATORY_CARE_PROVIDER_SITE_OTHER): Payer: Commercial Managed Care - HMO

## 2013-07-05 DIAGNOSIS — Z1211 Encounter for screening for malignant neoplasm of colon: Secondary | ICD-10-CM

## 2013-07-05 LAB — FECAL OCCULT BLOOD, IMMUNOCHEMICAL: Fecal Occult Bld: NEGATIVE

## 2013-08-06 ENCOUNTER — Other Ambulatory Visit: Payer: Self-pay

## 2013-08-06 MED ORDER — HYDROCHLOROTHIAZIDE 12.5 MG PO CAPS: 12.5000 mg | ORAL_CAPSULE | ORAL | Status: DC

## 2013-08-06 MED ORDER — LEVOTHYROXINE SODIUM 50 MCG PO TABS: 50.0000 ug | ORAL_TABLET | Freq: Every day | ORAL | Status: DC

## 2013-08-06 MED ORDER — ENALAPRIL MALEATE 20 MG PO TABS: 20.0000 mg | ORAL_TABLET | Freq: Every day | ORAL | Status: DC

## 2013-08-06 MED ORDER — METOPROLOL TARTRATE 100 MG PO TABS: ORAL_TABLET | ORAL | Status: DC

## 2013-08-06 NOTE — Telephone Encounter (Signed)
Pt left note requesting refill enalapril, metoprolol,HCTZ and levothyroxine to [facility]; pt notified via v/m done.

## 2013-11-12 ENCOUNTER — Ambulatory Visit (INDEPENDENT_AMBULATORY_CARE_PROVIDER_SITE_OTHER): Payer: Commercial Managed Care - HMO

## 2013-11-12 DIAGNOSIS — Z23 Encounter for immunization: Secondary | ICD-10-CM

## 2013-12-25 ENCOUNTER — Other Ambulatory Visit: Payer: Self-pay | Admitting: *Deleted

## 2013-12-25 MED ORDER — HYDROCHLOROTHIAZIDE 12.5 MG PO CAPS: 12.5000 mg | ORAL_CAPSULE | ORAL | Status: DC

## 2013-12-25 MED ORDER — METOPROLOL TARTRATE 100 MG PO TABS: ORAL_TABLET | ORAL | Status: DC

## 2013-12-25 MED ORDER — ENALAPRIL MALEATE 20 MG PO TABS: 20.0000 mg | ORAL_TABLET | Freq: Every day | ORAL | Status: DC

## 2013-12-25 MED ORDER — LEVOTHYROXINE SODIUM 50 MCG PO TABS: 50.0000 ug | ORAL_TABLET | Freq: Every day | ORAL | Status: DC

## 2014-03-20 NOTE — Telephone Encounter (Signed)
Pt request refills of all meds to [facility]; advised pt done in 12/2013. Pt will contact [employer].

## 2014-06-30 ENCOUNTER — Encounter: Payer: Commercial Managed Care - HMO | Admitting: Family Medicine

## 2014-08-04 ENCOUNTER — Ambulatory Visit (INDEPENDENT_AMBULATORY_CARE_PROVIDER_SITE_OTHER): Payer: Commercial Managed Care - HMO | Admitting: Family Medicine

## 2014-08-04 ENCOUNTER — Encounter: Payer: Self-pay | Admitting: Family Medicine

## 2014-08-04 VITALS — BP 126/80 | HR 56 | Temp 97.3°F | Ht 66.0 in | Wt 110.0 lb

## 2014-08-04 DIAGNOSIS — I10 Essential (primary) hypertension: Secondary | ICD-10-CM

## 2014-08-04 DIAGNOSIS — Z23 Encounter for immunization: Secondary | ICD-10-CM | POA: Diagnosis not present

## 2014-08-04 DIAGNOSIS — Z0000 Encounter for general adult medical examination without abnormal findings: Secondary | ICD-10-CM

## 2014-08-04 DIAGNOSIS — Z720 Tobacco use: Secondary | ICD-10-CM | POA: Diagnosis not present

## 2014-08-04 DIAGNOSIS — Z1211 Encounter for screening for malignant neoplasm of colon: Secondary | ICD-10-CM

## 2014-08-04 DIAGNOSIS — Z7189 Other specified counseling: Secondary | ICD-10-CM | POA: Insufficient documentation

## 2014-08-04 DIAGNOSIS — E039 Hypothyroidism, unspecified: Secondary | ICD-10-CM

## 2014-08-04 DIAGNOSIS — F17200 Nicotine dependence, unspecified, uncomplicated: Secondary | ICD-10-CM

## 2014-08-04 LAB — COMPREHENSIVE METABOLIC PANEL
ALBUMIN: 4.4 g/dL (ref 3.5–5.2)
ALT: 17 U/L (ref 0–35)
AST: 21 U/L (ref 0–37)
Alkaline Phosphatase: 72 U/L (ref 39–117)
BUN: 16 mg/dL (ref 6–23)
CALCIUM: 9.8 mg/dL (ref 8.4–10.5)
CHLORIDE: 98 meq/L (ref 96–112)
CO2: 29 meq/L (ref 19–32)
Creatinine, Ser: 0.83 mg/dL (ref 0.40–1.20)
GFR: 70.63 mL/min (ref >60.00)
Glucose, Bld: 87 mg/dL (ref 70–99)
Potassium: 4.3 mEq/L (ref 3.5–5.1)
Sodium: 134 mEq/L — ABNORMAL LOW (ref 135–145)
Total Bilirubin: 0.7 mg/dL (ref 0.2–1.2)
Total Protein: 7.2 g/dL (ref 6.0–8.3)

## 2014-08-04 LAB — TSH: TSH: 2.61 u[IU]/mL (ref 0.35–4.50)

## 2014-08-04 LAB — LIPID PANEL
Cholesterol: 244 mg/dL — ABNORMAL HIGH (ref 0–200)
HDL: 78.5 mg/dL (ref >39.00)
LDL Cholesterol: 143 mg/dL — ABNORMAL HIGH (ref 0–99)
NONHDL: 165.5
Total CHOL/HDL Ratio: 3
Triglycerides: 112 mg/dL (ref 0.0–149.0)
VLDL: 22.4 mg/dL (ref 0.0–40.0)

## 2014-08-04 MED ORDER — HYDROCHLOROTHIAZIDE 12.5 MG PO CAPS: 12.5000 mg | ORAL_CAPSULE | ORAL | Status: DC

## 2014-08-04 MED ORDER — ENALAPRIL MALEATE 20 MG PO TABS: 20.0000 mg | ORAL_TABLET | Freq: Every day | ORAL | Status: DC

## 2014-08-04 MED ORDER — LEVOTHYROXINE SODIUM 50 MCG PO TABS: 50.0000 ug | ORAL_TABLET | Freq: Every day | ORAL | Status: DC

## 2014-08-04 MED ORDER — METOPROLOL TARTRATE 100 MG PO TABS: ORAL_TABLET | ORAL | Status: DC

## 2014-08-04 NOTE — Assessment & Plan Note (Signed)
Controlled, continue as is with meds.  See notes on labs.  She agrees.  D/w pt about diet and daily activity.

## 2014-08-04 NOTE — Assessment & Plan Note (Signed)
No TMG. See notes on labs.  No change in meds.  D/w pt.  She agrees.

## 2014-08-04 NOTE — Assessment & Plan Note (Signed)
Encouraged cessation, precontemplative.

## 2014-08-04 NOTE — Progress Notes (Signed)
Pre visit review using our clinic review tool, if applicable. No additional management support is needed unless otherwise documented below in the visit note.  I have personally reviewed the Medicare Annual Wellness questionnaire and have noted 1. The patient's medical and social history 2. Their use of alcohol, tobacco or illicit drugs 3. Their current medications and supplements 4. The patient's functional ability including ADL's, fall risks, home safety risks and hearing or visual             impairment. 5. Diet and physical activities 6. Evidence for depression or mood disorders  The patients weight, height, BMI have been recorded in the chart and visual acuity is per eye clinic.  I have made referrals, counseling and provided education to the patient based review of the above and I have provided the pt with a written personalized care plan for preventive services.  Provider list updated- see scanned forms.  Routine anticipatory guidance given to patient.  See health maintenance.  Flu 2015 Shingles 2012 PNA 2008 Tetanus 2012 D/w patient [name] for colon cancer screening, including IFOB vs. colonoscopy.  Risks and benefits of both were discussed and patient voiced understanding.  Pt elects WUJ:[id number].  Breast cancer screening- pt declined for now, d/w pt about options.  DXA- pt declined for now, d/w pt about options.  Living will d/w pt, encouraged. Daughter designated if patient were incapacitated.  Cognitive function addressed- see scanned forms- and if abnormal then additional documentation follows.   Hypertension:    Using medication without problems or lightheadedness: yes Chest pain with exertion:no Edema:no Short of breath:no Due for labs.   Hypothyroid.   No neck mass, no dysphagia, no ADE on med.  Due for TSH.  Compliant with med.   PMH and SH reviewed  Meds, vitals, and allergies reviewed.   ROS: See HPI.  Otherwise negative.    GEN: nad, alert and  oriented HEENT: mucous membranes moist NECK: supple w/o LA, no TMG CV: rrr. PULM: ctab, no inc wob ABD: soft, +bs EXT: no edema SKIN: no acute rash

## 2014-08-04 NOTE — Patient Instructions (Signed)
Take care. Glad to see you.  Don't change your meds for now.  Go to the lab on the way out.  We'll contact you with your lab report.

## 2014-08-04 NOTE — Assessment & Plan Note (Signed)
Flu 2015 Shingles 2012 PNA 2008 Tetanus 2012 D/w patient [id number] for colon cancer screening, including IFOB vs. colonoscopy.  Risks and benefits of both were discussed and patient voiced understanding.  Pt elects UTM:LYYT.  Breast cancer screening- pt declined for now, d/w pt about options.  DXA- pt declined for now, d/w pt about options.  Living will d/w pt, encouraged. Daughter designated if patient were incapacitated.  Cognitive function addressed- see scanned forms- and if abnormal then additional documentation follows.

## 2014-08-05 ENCOUNTER — Encounter: Payer: Self-pay | Admitting: *Deleted

## 2014-08-15 ENCOUNTER — Other Ambulatory Visit (INDEPENDENT_AMBULATORY_CARE_PROVIDER_SITE_OTHER): Payer: Commercial Managed Care - HMO

## 2014-08-15 DIAGNOSIS — Z1211 Encounter for screening for malignant neoplasm of colon: Secondary | ICD-10-CM | POA: Diagnosis not present

## 2014-08-15 LAB — FECAL OCCULT BLOOD, IMMUNOCHEMICAL: FECAL OCCULT BLD: NEGATIVE

## 2014-08-18 ENCOUNTER — Encounter: Payer: Self-pay | Admitting: *Deleted

## 2014-11-06 ENCOUNTER — Encounter: Payer: Self-pay | Admitting: Internal Medicine

## 2014-11-06 ENCOUNTER — Telehealth: Payer: Self-pay | Admitting: Family Medicine

## 2014-11-06 ENCOUNTER — Ambulatory Visit (INDEPENDENT_AMBULATORY_CARE_PROVIDER_SITE_OTHER): Payer: Commercial Managed Care - HMO | Admitting: Internal Medicine

## 2014-11-06 VITALS — BP 124/70 | HR 71 | Temp 98.1°F | Wt 112.0 lb

## 2014-11-06 DIAGNOSIS — I10 Essential (primary) hypertension: Secondary | ICD-10-CM

## 2014-11-06 DIAGNOSIS — R42 Dizziness and giddiness: Secondary | ICD-10-CM

## 2014-11-06 MED ORDER — MECLIZINE HCL 50 MG PO TABS: 25.0000 mg | ORAL_TABLET | Freq: Three times a day (TID) | ORAL | Status: DC | PRN

## 2014-11-06 NOTE — Patient Instructions (Signed)

## 2014-11-06 NOTE — Telephone Encounter (Signed)
Patient Name: Jill Franco  DOB: [birth year]    Initial Comment Caller states she has been dizzy for 3 days. For the past 2 nights, she has had noise in ear.    Nurse Assessment  Nurse: Mallie Mussel, RN, Alveta Heimlich Date/Time Eilene Ghazi Time): 11/06/2014 8:51:51 AM  Confirm and document reason for call. If symptomatic, describe symptoms. ---Caller states that she has had dizziness for the past 3 days. She states that at times, she feels like she will fall. The dizziness was something that was not constant, but it was bad yesterday evening. Denies dizziness at present. She has had a drumming noise in hers at night. The sound comes and goes. Denies the sound at this time.  Has the patient traveled out of the country within the last 30 days? ---No  Does the patient require triage? ---Yes  Related visit to physician within the last 2 weeks? ---No  Does the PT have any chronic conditions? (i.e. diabetes, asthma, etc.) ---Yes  List chronic conditions. ---HTN, Hypothyroidism     Guidelines    Guideline Title Affirmed Question Affirmed Notes  Dizziness - Lightheadedness Taking a medicine that could cause dizziness (e.g., blood pressure medications, diuretics) No change in med or dosage.   Final Disposition User   See Physician within [place], RN, Alveta Heimlich    Comments  Appointment scheduled for 2:00pm with Webb Silversmith NP.  Caller states that if she does not have the co-pay in cash will it be okay to use her debit card. Advised her that I do not work in the office, but I doubt it will be a problem. Most debit cards are used as credit cards now. She states that there is a bank across the street if they won't accept her card.   Referrals  REFERRED TO PCP OFFICE   Disagree/Comply: Comply

## 2014-11-06 NOTE — Progress Notes (Signed)
Subjective:    Patient ID: Jill Franco, female    DOB: Oct 30, 1934, 80 y.o.   MRN: [id number]  HPI  Pt presents to the clinic today with c/o dizziness and ringing in her ears. She reports this started 2 days ago. Her symptoms have been intermittent and last for a very short period of time. The dizziness can occuring at varying times and does not relate to head movements, positions changes, etc. She describes the dizziness as a "woozy feeling", not necessarily that the room is spinning. The ringing in her ears occurs mostly at night. She describes it as a "drumming sensation". She denies blurred vision, headache, URI symptoms, chest pain or shortness of breath. She does not have background ringing in her ears. She has not tried anything OTC for her symptoms.  She also feels like her BP is elevated. She has taken it at home with her cuff that is about 78 years old. She is getting readings like 156/90, etc. She does take Enalapril, HCTZ and Metoprolol as prescribed. Her BP today is 124/70.  Review of Systems      Past Medical History  Diagnosis Date  . HLD (hyperlipidemia)   . HTN (hypertension)   . Hypothyroidism   . History of shingles 2012    Current Outpatient Prescriptions  Medication Sig Dispense Refill  . aspirin 81 MG EC tablet Take 162 mg by mouth daily.      . enalapril (VASOTEC) 20 MG tablet Take 1 tablet (20 mg total) by mouth daily. 90 tablet 3  . hydrochlorothiazide (MICROZIDE) 12.5 MG capsule Take 1 capsule (12.5 mg total) by mouth every morning. 90 capsule 3  . levothyroxine (SYNTHROID, LEVOTHROID) 50 MCG tablet Take 1 tablet (50 mcg total) by mouth daily. 90 tablet 3  . metoprolol (LOPRESSOR) 100 MG tablet 1/2 in the morning and 1 in the evening 135 tablet 3   No current facility-administered medications for this visit.    Allergies  Allergen Reactions  . Aspirin     REACTION: Uncoated Stomach upset on empty stomach  . Codeine     REACTION: UNSPECIFIED     Family History  Problem Relation Age of Onset  . Stroke Father 81  . Cancer Brother   . Colon cancer Neg Hx   . Breast cancer Neg Hx     Social History   Social History  . Marital Status: Widowed    Spouse Name: N/A  . Number of Children: 1  . Years of Education: N/A   Occupational History  . 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 Smoking status: Current Every Day Smoker -- 0.33 packs/day for 58 years    Types: Cigarettes  . Smokeless tobacco: Not on file  . Alcohol Use: No  . Drug Use: No  . Sexual Activity: Not on file   Other Topics Concern  . Not on file   Social History Narrative   From [employer]   Retired 2007   Widowed 2008 after 84 years   Enjoys yard work   Her daughter is helping at home some (rarely as of 2016)     Constitutional: Denies fever, malaise, fatigue, headache or abrupt weight changes.  HEENT: Denies eye pain, eye redness, ear pain, ringing in the ears, wax buildup, runny nose, nasal congestion, bloody nose, or sore throat. Respiratory: Denies difficulty breathing, shortness of breath, cough or sputum production.   Cardiovascular: Denies chest pain, chest tightness,  palpitations or swelling in the hands or feet.  Neurological: Pt reports dizziness. Denies difficulty with memory, difficulty with speech or problems with balance and coordination.    No other specific complaints in a complete review of systems (except as listed in HPI above).  Objective:   Physical Exam  BP 124/70 mmHg  Pulse 71  Temp(Src) 98.1 F (36.7 C) (Oral)  Wt 112 lb (50.803 kg)  SpO2 98% Wt Readings from Last 3 Encounters:  11/06/14 112 lb (50.803 kg)  08/04/14 110 lb (49.896 kg)  06/21/13 113 lb 12 oz (51.597 kg)    General: Appears her stated age, in NAD. HEENT: Head: normal shape and size, no sinus tenderness noted; Eyes: sclera white, no icterus, conjunctiva pink, PERRLA and EOMs intact; Ears: Tm's gray and intact,  normal light reflex;  Neck:  Neck supple. No masses, lumps or thyromegaly present.  Cardiovascular: Normal rate and rhythm. S1,S2 noted.  No murmur, rubs or gallops noted.  Pulmonary/Chest: Normal effort and positive vesicular breath sounds. No respiratory distress. No wheezes, rales or ronchi noted.  Neurological: Alert and oriented. Coordination normal.    BMET    Component Value Date/Time   NA 134* 08/04/2014 1101   K 4.3 08/04/2014 1101   CL 98 08/04/2014 1101   CO2 29 08/04/2014 1101   GLUCOSE 87 08/04/2014 1101   BUN 16 08/04/2014 1101   CREATININE 0.83 08/04/2014 1101   CALCIUM 9.8 08/04/2014 1101   GFRNONAA 74.74 04/28/2009 1014   GFRAA 91 06/04/2007 1000    Lipid Panel     Component Value Date/Time   CHOL 244* 08/04/2014 1101   TRIG 112.0 08/04/2014 1101   HDL 78.50 08/04/2014 1101   CHOLHDL 3 08/04/2014 1101   VLDL 22.4 08/04/2014 1101   LDLCALC 143* 08/04/2014 1101    CBC    Component Value Date/Time   WBC 5.1 09/23/2008 0907   RBC 5.03 09/23/2008 0907   HGB 16.8* 09/23/2008 0907   HCT 48.9* 09/23/2008 0907   PLT 175.0 09/23/2008 0907   MCV 97.3 09/23/2008 0907   MCHC 34.3 09/23/2008 0907   RDW 12.5 09/23/2008 0907   LYMPHSABS 1.3 09/23/2008 0907   MONOABS 0.4 09/23/2008 0907   EOSABS 0.1 09/23/2008 0907   BASOSABS 0.0 09/23/2008 0907    Hgb A1C No results found for: HGBA1C       Assessment & Plan:   Dizziness:  Likely episode of vertigo Advised her to make sure she is drinking plenty of water Advised her to make position changes slowly eRx for Meclizine 25 mg TID prn for dizziness If symptoms persist, consider CBC, CMET and possibly ECG  HTN:  BP at goal with current meds Advised her to stop checking her BP at home with that machine, likely too old and inaccurate  RTC as needed or if symptoms persist or worsen

## 2014-11-06 NOTE — Telephone Encounter (Signed)
Pt has 30 min appt on 11/06/14 at 2 pm. With Avie Echevaria NP.

## 2014-11-06 NOTE — Progress Notes (Signed)
Pre visit review using our clinic review tool, if applicable. No additional management support is needed unless otherwise documented below in the visit note. 

## 2015-01-29 ENCOUNTER — Ambulatory Visit (INDEPENDENT_AMBULATORY_CARE_PROVIDER_SITE_OTHER): Payer: Commercial Managed Care - HMO

## 2015-01-29 DIAGNOSIS — Z23 Encounter for immunization: Secondary | ICD-10-CM

## 2015-03-20 ENCOUNTER — Ambulatory Visit (INDEPENDENT_AMBULATORY_CARE_PROVIDER_SITE_OTHER): Payer: PPO | Admitting: Family Medicine

## 2015-03-20 ENCOUNTER — Encounter: Payer: Self-pay | Admitting: Family Medicine

## 2015-03-20 VITALS — BP 128/62 | HR 44 | Temp 97.8°F | Ht 66.0 in | Wt 111.5 lb

## 2015-03-20 DIAGNOSIS — J309 Allergic rhinitis, unspecified: Secondary | ICD-10-CM | POA: Diagnosis not present

## 2015-03-20 NOTE — Patient Instructions (Signed)
Start zyrtec at bedtime daily.  In AM flonase 2 sprays per nostril daily. Call if new fever, or new shortness of breath or if not improving as expected.

## 2015-03-20 NOTE — Assessment & Plan Note (Signed)
Treat with oral antihistamine and start nasal steroid spray. No current sign of infection.

## 2015-03-20 NOTE — Progress Notes (Signed)
Pre visit review using our clinic review tool, if applicable. No additional management support is needed unless otherwise documented below in the visit note. 

## 2015-03-20 NOTE — Progress Notes (Signed)
   Subjective:    Patient ID: Jill Franco, female    DOB: March 22, 1934, 81 y.o.   MRN: [id number]  Cough This is a new problem. The current episode started 1 to 4 weeks ago ( 2weeks). The problem has been gradually worsening. The cough is productive of sputum (clear occ productive). Associated symptoms include nasal congestion, postnasal drip and rhinorrhea. Pertinent negatives include no chills, ear congestion, ear pain, fever, myalgias, sore throat, shortness of breath or wheezing. Associated symptoms comments: Hoarse, sore tickly throat, eyes watering. Exacerbated by: day and night. Risk factors for lung disease include smoking/tobacco exposure. Treatments tried: OTC sinus medication. There is no history of asthma, bronchiectasis, bronchitis, COPD, emphysema or pneumonia.    Social History /Family History/Past Medical History reviewed and updated if needed.   Review of Systems  Constitutional: Negative for fever and chills.  HENT: Positive for postnasal drip and rhinorrhea. Negative for ear pain and sore throat.   Respiratory: Positive for cough. Negative for shortness of breath and wheezing.   Musculoskeletal: Negative for myalgias.       Objective:   Physical Exam  Constitutional: Vital signs are normal. She appears well-developed and well-nourished. She is cooperative.  Non-toxic appearance. She does not appear ill. No distress.  HENT:  Head: Normocephalic.  Right Ear: Hearing, tympanic membrane, external ear and ear canal normal. Tympanic membrane is not erythematous, not retracted and not bulging.  Left Ear: Hearing, tympanic membrane, external ear and ear canal normal. Tympanic membrane is not erythematous, not retracted and not bulging.  Nose: Mucosal edema and rhinorrhea present. Right sinus exhibits no maxillary sinus tenderness and no frontal sinus tenderness. Left sinus exhibits no maxillary sinus tenderness and no frontal sinus tenderness.  Mouth/Throat: Uvula is midline,  oropharynx is clear and moist and mucous membranes are normal.  Eyes: Conjunctivae, EOM and lids are normal. Pupils are equal, round, and reactive to light. Lids are everted and swept, no foreign bodies found.  Neck: Trachea normal and normal range of motion. Neck supple. Carotid bruit is not present. No thyroid mass and no thyromegaly present.  Cardiovascular: Normal rate, regular rhythm, S1 normal, S2 normal, normal heart sounds, intact distal pulses and normal pulses.  Exam reveals no gallop and no friction rub.   No murmur heard. Pulmonary/Chest: Effort normal and breath sounds normal. No tachypnea. No respiratory distress. She has no decreased breath sounds. She has no wheezes. She has no rhonchi. She has no rales.  Neurological: She is alert.  Skin: Skin is warm, dry and intact. No rash noted.  Psychiatric: Her speech is normal and behavior is normal. Judgment normal. Her mood appears not anxious. Cognition and memory are normal. She does not exhibit a depressed mood.          Assessment & Plan:

## 2015-03-23 ENCOUNTER — Ambulatory Visit: Payer: Commercial Managed Care - HMO | Admitting: Family Medicine

## 2015-03-24 ENCOUNTER — Telehealth: Payer: Self-pay | Admitting: Family Medicine

## 2015-03-24 NOTE — Telephone Encounter (Signed)
[facility] Patient Name: Jill Franco DOB: 30-Nov-1934 Initial Comment caller states she was in the office last friday - told she has allergies, has not gotten better, has a sore throat and coughing. Also has watery eyes Nurse Assessment Nurse: Doyle Askew, RN, Joelene Millin Date/Time Eilene Ghazi Time): 03/24/2015 3:37:58 PM Confirm and document reason for call. If symptomatic, describe symptoms. You must click the next button to save text entered. ---caller states she was in the office last friday - told she has allergies, has not gotten better, has a sore throat and coughing. Also has watery eyes. Was told throat and lungs looked/sounded good. Pt has taken the medication that they told her to take and has not had any improvement. No fever. Has the patient traveled out of the country within the last 30 days? ---No Does the patient have any new or worsening symptoms? ---Yes Will a triage be completed? ---Yes Related visit to physician within the last 2 weeks? ---Yes Does the PT have any chronic conditions? (i.e. diabetes, asthma, etc.) ---Yes List chronic conditions. ---htn (metoprolol, HCTZ, and enalapril), thyroid (levothyroxine), ASA (baby), nasal spray (fluticasone) and hydrochloride (for allergies). Is this a behavioral health or substance abuse call? ---No Guidelines Guideline Title Affirmed Question Affirmed Notes Cough - Acute Non-Productive [1] Patient also has allergy symptoms (e.g., itchy eyes, clear nasal discharge, postnasal drip) AND [2] they are acting up Final Disposition User See PCP When Office is Open (within 3 days) Doyle Askew, RN, Joelene Millin Comments Pt is taking OTC Allergy medication and nasal spray. these are not helping her. She has been up the past 2 night with constant cough (always worse at night). Is there anything else that can be called in for pt to help with drainage and  cough? Referrals REFERRED TO PCP OFFICE Disagree/Comply: Comply

## 2015-03-24 NOTE — Telephone Encounter (Signed)
Kim with [facility] requested me to call pt; spoke with pt; pt was seen 03/20/15; pt has been taking Zyrtec and Flonase as instructed. Pt has no fever,SOB or congestion but is no better than when seen 03/20/15. Pt has runny nose, tickle in throat and non prod cough. Cough did not allow pt to sleep last night; pt has been taking OTC Delsym. Pt wants to know what to do. [facility].

## 2015-03-25 MED ORDER — BENZONATATE 200 MG PO CAPS: 200.0000 mg | ORAL_CAPSULE | Freq: Three times a day (TID) | ORAL | Status: DC | PRN

## 2015-03-25 NOTE — Telephone Encounter (Signed)
I would give the flonase a little longer.   If the post nasal gtt is causing the cough, she can use tessalon in the meantime.  Sent.  Thanks.

## 2015-03-25 NOTE — Telephone Encounter (Signed)
Pt left v/m requesting cb about what to do about allergies and cough; pt request cb.

## 2015-03-25 NOTE — Telephone Encounter (Signed)
Patient notified as instructed by telephone and verbalized understanding. 

## 2015-04-13 ENCOUNTER — Telehealth: Payer: Self-pay | Admitting: Family Medicine

## 2015-04-13 NOTE — Telephone Encounter (Signed)
Pt needs medications refilled. Pt has [employer] now as opposed to what she had last year. Pt needs all 4 medications she takes called into the [facility] on [facility].  Pt said she needs 3 months worth called in.  [phone]

## 2015-04-14 MED ORDER — METOPROLOL TARTRATE 100 MG PO TABS: ORAL_TABLET | ORAL | Status: DC

## 2015-04-14 MED ORDER — ENALAPRIL MALEATE 20 MG PO TABS: 20.0000 mg | ORAL_TABLET | Freq: Every day | ORAL | Status: DC

## 2015-04-14 MED ORDER — LEVOTHYROXINE SODIUM 50 MCG PO TABS: 50.0000 ug | ORAL_TABLET | Freq: Every day | ORAL | Status: DC

## 2015-04-14 MED ORDER — HYDROCHLOROTHIAZIDE 12.5 MG PO CAPS: 12.5000 mg | ORAL_CAPSULE | ORAL | Status: DC

## 2015-04-14 NOTE — Telephone Encounter (Signed)
I spoke with pt and pt requested 90 day refill enalapril,HCTZ,levothyroxine and metoprolol to [facility]. Pt said [facility] pharmacy had been notified not to send any more refills.Advised pt done per protocol; pt has CPX scheduled for 07/2015.

## 2015-07-27 ENCOUNTER — Ambulatory Visit (INDEPENDENT_AMBULATORY_CARE_PROVIDER_SITE_OTHER): Payer: PPO

## 2015-07-27 VITALS — BP 122/68 | HR 69 | Temp 98.3°F | Ht 64.5 in | Wt 109.2 lb

## 2015-07-27 DIAGNOSIS — Z0000 Encounter for general adult medical examination without abnormal findings: Secondary | ICD-10-CM | POA: Diagnosis not present

## 2015-07-27 NOTE — Patient Instructions (Signed)
Ms. Jill Franco , Thank you for taking time to come for your Medicare Wellness Visit. I appreciate your ongoing commitment to your health goals. Please review the following plan we discussed and let me know if I can assist you in the future.   These are the goals we discussed: Goals    . Increase physical activity     Starting 07/27/2015, I will continue to exercise for at least 30 min 6 days per week.        This is a list of the screening recommended for you and due dates:  Health Maintenance  Topic Date Due  . DEXA scan (bone density measurement)  07/27/2023*  . Flu Shot  09/22/2015  . Tetanus Vaccine  05/30/2020  . Shingles Vaccine  Addressed  . Pneumonia vaccines  Completed  *Topic was postponed. The date shown is not the original due date.    Preventive Care for Adults  A healthy lifestyle and preventive care can promote health and wellness. Preventive health guidelines for adults include the following key practices.  . A routine yearly physical is a good way to check with your health care provider about your health and preventive screening. It is a chance to share any concerns and updates on your health and to receive a thorough exam.  . Visit your dentist for a routine exam and preventive care every 6 months. Brush your teeth twice a day and floss once a day. Good oral hygiene prevents tooth decay and gum disease.  . The frequency of eye exams is based on your age, health, family medical history, use  of contact lenses, and other factors. Follow your health care provider's ecommendations for frequency of eye exams.  . Eat a healthy diet. Foods like vegetables, fruits, whole grains, low-fat dairy products, and lean protein foods contain the nutrients you need without too many calories. Decrease your intake of foods high in solid fats, added sugars, and salt. Eat the right amount of calories for you. Get information about a proper diet from your health care provider, if  necessary.  . Regular physical exercise is one of the most important things you can do for your health. Most adults should get at least 150 minutes of moderate-intensity exercise (any activity that increases your heart rate and causes you to sweat) each week. In addition, most adults need muscle-strengthening exercises on 2 or more days a week.  Silver Sneakers may be a benefit available to you. To determine eligibility, you may visit the website: www.silversneakers.com or contact program at (904)331-5810 Mon-Fri between 8AM-8PM.   . Maintain a healthy weight. The body mass index (BMI) is a screening tool to identify possible weight problems. It provides an estimate of body fat based on height and weight. Your health care provider can find your BMI and can help you achieve or maintain a healthy weight.   For adults 20 years and older: ? A BMI below 18.5 is considered underweight. ? A BMI of 18.5 to 24.9 is normal. ? A BMI of 25 to 29.9 is considered overweight. ? A BMI of 30 and above is considered obese.   . Maintain normal blood lipids and cholesterol levels by exercising and minimizing your intake of saturated fat. Eat a balanced diet with plenty of fruit and vegetables. Blood tests for lipids and cholesterol should begin at age 63 and be repeated every 5 years. If your lipid or cholesterol levels are high, you are over 50, or you are at high risk  for heart disease, you may need your cholesterol levels checked more frequently. Ongoing high lipid and cholesterol levels should be treated with medicines if diet and exercise are not working.  . If you smoke, find out from your health care provider how to quit. If you do not use tobacco, please do not start.  . If you choose to drink alcohol, please do not consume more than 2 drinks per day. One drink is considered to be 12 ounces (355 mL) of beer, 5 ounces (148 mL) of wine, or 1.5 ounces (44 mL) of liquor.  . If you are 60-18 years old, ask your  health care provider if you should take aspirin to prevent strokes.  . Use sunscreen. Apply sunscreen liberally and repeatedly throughout the day. You should seek shade when your shadow is shorter than you. Protect yourself by wearing long sleeves, pants, a wide-brimmed hat, and sunglasses year round, whenever you are outdoors.  . Once a month, do a whole body skin exam, using a mirror to look at the skin on your back. Tell your health care provider of new moles, moles that have irregular borders, moles that are larger than a pencil eraser, or moles that have changed in shape or color.

## 2015-07-27 NOTE — Progress Notes (Signed)
Subjective:   Jill Franco is a 81 y.o. female who presents for Medicare Annual/Subsequent preventive examination.  Review of Systems:  N/A Cardiac Risk Factors include: advanced age (>47men, >74 women);hypertension;smoking/ tobacco exposure     Objective:    Vitals: BP 122/68 mmHg  Pulse 69  Temp(Src) 98.3 F (36.8 C) (Oral)  Ht 5' 4.5" (1.638 m)  Wt 109 lb 4 oz (49.555 kg)  BMI 18.47 kg/m2  SpO2 98%  Body mass index is 18.47 kg/(m^2).  Tobacco History  Smoking status  . Current Every Day Smoker -- 0.33 packs/day for 58 years  . Types: Cigarettes  Smokeless tobacco  . Not on file     Ready to quit: No Counseling given: No   Past Medical History  Diagnosis Date  . HLD (hyperlipidemia)   . HTN (hypertension)   . Hypothyroidism   . History of shingles 2012   Past Surgical History  Procedure Laterality Date  . Appendectomy  childhood  . Abdominal exploration surgery    . Laminectomy and microdiscectomy spine  1960's  . Breast lumpectomy  1960's    benign  . Nsvd      x1  . Dilation and curettage of uterus  1960's    miscarriage  . Carotid ultrasound  06/19/2007    0-39% stenosis  . Doppler echocardiography  06/19/2007    Normal EF 55-70%   Family History  Problem Relation Age of Onset  . Stroke Father 2  . Cancer Brother   . Colon cancer Neg Hx   . Breast cancer Neg Hx    History  Sexual Activity  . Sexual Activity: No    Outpatient Encounter Prescriptions as of 07/27/2015  Medication Sig  . aspirin 81 MG EC tablet Take 162 mg by mouth daily.    . enalapril (VASOTEC) 20 MG tablet Take 1 tablet (20 mg total) by mouth daily.  . hydrochlorothiazide (MICROZIDE) 12.5 MG capsule Take 1 capsule (12.5 mg total) by mouth every morning.  Marland Kitchen levothyroxine (SYNTHROID, LEVOTHROID) 50 MCG tablet Take 1 tablet (50 mcg total) by mouth daily.  . metoprolol (LOPRESSOR) 100 MG tablet 1/2 in the morning and 1 in the evening  . [DISCONTINUED] benzonatate (TESSALON)  200 MG capsule Take 1 capsule (200 mg total) by mouth 3 (three) times daily as needed.  . [DISCONTINUED] meclizine (ANTIVERT) 50 MG tablet Take 0.5 tablets (25 mg total) by mouth 3 (three) times daily as needed.   No facility-administered encounter medications on file as of 07/27/2015.    Activities of Daily Living In your present state of health, do you have any difficulty performing the following activities: 07/27/2015  Hearing? N  Vision? N  Difficulty concentrating or making decisions? N  Walking or climbing stairs? N  Dressing or bathing? N  Doing errands, shopping? N  Preparing Food and eating ? N  Using the Toilet? N  In the past six months, have you accidently leaked urine? N  Do you have problems with loss of bowel control? N  Managing your Medications? N  Managing your Finances? N  Housekeeping or managing your Housekeeping? N    Patient Care Team: Tonia Ghent, MD as PCP - General (Family Medicine) Marica Otter, OD as Referring Physician (Optometry)   Assessment:     Hearing Screening   125Hz  250Hz  500Hz  1000Hz  2000Hz  4000Hz  8000Hz   Right ear:   40 0 40 0   Left ear:   40 40 40 0   Vision  Screening Comments: Last eye exam in Sept 2016   Exercise Activities and Dietary recommendations Current Exercise Habits: Home exercise routine, Type of exercise: walking, Time (Minutes): 30, Frequency (Times/Week): 6, Weekly Exercise (Minutes/Week): 180, Intensity: Mild, Exercise limited by: None identified  Goals    . Increase physical activity     Starting 07/27/2015, I will continue to exercise for at least 30 min 6 days per week.       Fall Risk Fall Risk  07/27/2015 08/04/2014 06/21/2013 06/19/2012  Falls in the past year? No No No No   Depression Screen PHQ 2/9 Scores 07/27/2015 08/04/2014 06/21/2013 06/19/2012  PHQ - 2 Score 0 0 0 0    Cognitive Testing MMSE - Mini Mental State Exam 07/27/2015  Orientation to time 5  Orientation to Place 5  Registration 3  Attention/  Calculation 0  Recall 3  Language- name 2 objects 0  Language- repeat 1  Language- follow 3 step command 3  Language- read & follow direction 0  Write a sentence 0  Copy design 0  Total score 20   PLEASE NOTE: A Mini-Cog screen was completed. Maximum score is 20. A value of 0 denotes this part of Folstein MMSE was not completed or the patient failed this part of the Mini-Cog screening.   Mini-Cog Screening Orientation to Time - Max 5 pts Orientation to Place - Max 5 pts Registration - Max 3 pts Recall - Max 3 pts Language Repeat - Max 1 pts Language Follow 3 Step Command - Max 3 pts   Immunization History  Administered Date(s) Administered  . Influenza Split 12/06/2010  . Influenza Whole 11/21/2004, 11/30/2006, 12/02/2008, 11/27/2009  . Influenza,inj,Quad PF,36+ Mos 12/11/2012, 11/12/2013, 01/29/2015  . Pneumococcal Conjugate-13 08/04/2014  . Pneumococcal Polysaccharide-23 05/23/2006  . Td 05/22/2004  . Tdap 05/31/2010  . Zoster 06/11/2010   Screening Tests Health Maintenance  Topic Date Due  . DEXA SCAN  07/27/2023 (Originally 05/21/2001)  . INFLUENZA VACCINE  09/22/2015  . TETANUS/TDAP  05/30/2020  . ZOSTAVAX  Addressed  . PNA vac Low Risk Adult  Completed      Plan:     I have personally reviewed and addressed the Medicare Annual Wellness questionnaire and have noted the following in the patient's chart:  A. Medical and social history B. Use of alcohol, tobacco or illicit drugs  C. Current medications and supplements D. Functional ability and status E.  Nutritional status F.  Physical activity G. Advance directives H. List of other physicians I.  Hospitalizations, surgeries, and ER visits in previous 12 months J.  [place] to include hearing, vision, cognitive, depression L. Referrals and appointments - none  In addition, I have reviewed and discussed with patient certain preventive protocols, quality metrics, and best practice recommendations.  A written personalized care plan for preventive services as well as general preventive health recommendations were provided to patient.  See attached scanned questionnaire for additional information.   Signed,   Lindell Noe, MHA, BS, LPN Health Advisor

## 2015-07-27 NOTE — Progress Notes (Signed)
PCP notes:  Health maintenance:  Bone density - pt declined  Abnormal screenings:  Hearing - failed  Patient concerns: Pt is still working on tobacco cessation.  Nurse concerns: None  Next PCP appt: 08/06/15 @ 0945  I reviewed health advisor's note, was available for consultation on the day of service listed in this note, and agree with documentation and plan. Joyell Stain, MD.

## 2015-07-27 NOTE — Progress Notes (Signed)
Pre visit review using our clinic review tool, if applicable. No additional management support is needed unless otherwise documented below in the visit note. 

## 2015-08-06 ENCOUNTER — Ambulatory Visit (INDEPENDENT_AMBULATORY_CARE_PROVIDER_SITE_OTHER): Payer: PPO | Admitting: Family Medicine

## 2015-08-06 ENCOUNTER — Encounter: Payer: Self-pay | Admitting: Family Medicine

## 2015-08-06 VITALS — BP 136/80 | HR 52 | Temp 97.7°F | Ht 65.0 in | Wt 108.5 lb

## 2015-08-06 DIAGNOSIS — I10 Essential (primary) hypertension: Secondary | ICD-10-CM

## 2015-08-06 DIAGNOSIS — Z1211 Encounter for screening for malignant neoplasm of colon: Secondary | ICD-10-CM | POA: Diagnosis not present

## 2015-08-06 DIAGNOSIS — Z7189 Other specified counseling: Secondary | ICD-10-CM

## 2015-08-06 DIAGNOSIS — Z720 Tobacco use: Secondary | ICD-10-CM

## 2015-08-06 DIAGNOSIS — IMO0001 Reserved for inherently not codable concepts without codable children: Secondary | ICD-10-CM

## 2015-08-06 DIAGNOSIS — E039 Hypothyroidism, unspecified: Secondary | ICD-10-CM

## 2015-08-06 DIAGNOSIS — F17200 Nicotine dependence, unspecified, uncomplicated: Secondary | ICD-10-CM

## 2015-08-06 LAB — COMPREHENSIVE METABOLIC PANEL
ALBUMIN: 4.3 g/dL (ref 3.5–5.2)
ALK PHOS: 78 U/L (ref 39–117)
ALT: 20 U/L (ref 0–35)
AST: 26 U/L (ref 0–37)
BUN: 13 mg/dL (ref 6–23)
CALCIUM: 9.7 mg/dL (ref 8.4–10.5)
CO2: 32 mEq/L (ref 19–32)
CREATININE: 0.83 mg/dL (ref 0.40–1.20)
Chloride: 99 mEq/L (ref 96–112)
GFR: 70.45 mL/min (ref >60.00)
Glucose, Bld: 95 mg/dL (ref 70–99)
POTASSIUM: 4.2 meq/L (ref 3.5–5.1)
SODIUM: 135 meq/L (ref 135–145)
TOTAL PROTEIN: 7.2 g/dL (ref 6.0–8.3)
Total Bilirubin: 0.6 mg/dL (ref 0.2–1.2)

## 2015-08-06 LAB — LIPID PANEL
CHOLESTEROL: 249 mg/dL — AB (ref 0–200)
HDL: 81.4 mg/dL (ref >39.00)
LDL Cholesterol: 147 mg/dL — ABNORMAL HIGH (ref 0–99)
NonHDL: 167.73
TRIGLYCERIDES: 106 mg/dL (ref 0.0–149.0)
Total CHOL/HDL Ratio: 3
VLDL: 21.2 mg/dL (ref 0.0–40.0)

## 2015-08-06 LAB — TSH: TSH: 2.28 u[IU]/mL (ref 0.35–4.50)

## 2015-08-06 MED ORDER — METOPROLOL TARTRATE 100 MG PO TABS: ORAL_TABLET | ORAL | Status: DC

## 2015-08-06 MED ORDER — LEVOTHYROXINE SODIUM 50 MCG PO TABS
50.0000 ug | ORAL_TABLET | Freq: Every day | ORAL | Status: DC
Start: 2015-08-06 — End: 2016-03-21

## 2015-08-06 MED ORDER — HYDROCHLOROTHIAZIDE 12.5 MG PO CAPS: 12.5000 mg | ORAL_CAPSULE | ORAL | Status: DC

## 2015-08-06 MED ORDER — ENALAPRIL MALEATE 20 MG PO TABS: 20.0000 mg | ORAL_TABLET | Freq: Every day | ORAL | Status: DC

## 2015-08-06 NOTE — Progress Notes (Signed)
Pre visit review using our clinic review tool, if applicable. No additional management support is needed unless otherwise documented below in the visit note.  Bone density - pt declined. D/w pt.  She'll consider in the meantime.  Hearing - failed screening.  Declined hearing aids.  D/w pt.  Pt is still working on tobacco cessation, encouraged.  D/w pt. Declined mammogram. D/w pt.    D/w patient [id number] for colon cancer screening, including IFOB vs. colonoscopy.  Risks and benefits of both were discussed and patient voiced understanding.  Pt elects for: IFOB.    Hypertension:    Using medication without problems or lightheadedness: yes Chest pain with exertion:no Edema: occ mild B foot edema if prolonged riding in the car but not o/w Short of breath:no Due for labs.   If incapacitated, then she would have her daughter Weston Anna to speak on her behalf.    Hypothyroid.  Due for TSH.  No neck mass.  No ADE on med.  Compliant with med.   Meds, vitals, and allergies reviewed.   PMH and SH reviewed  ROS: Per HPI unless specifically indicated in ROS section   GEN: nad, alert and oriented HEENT: mucous membranes moist NECK: supple w/o LA, no tmg.   CV: rrr. PULM: ctab, no inc wob ABD: soft, +bs EXT: no edema SKIN: no acute rash

## 2015-08-06 NOTE — Assessment & Plan Note (Signed)
D/w patient re:options for colon cancer screening, including IFOB vs. colonoscopy.  Risks and benefits of both were discussed and patient voiced understanding.  Pt elects for:IFOB  

## 2015-08-06 NOTE — Assessment & Plan Note (Signed)
Discussed cessation 

## 2015-08-06 NOTE — Patient Instructions (Addendum)
I would get a flu shot each fall.   Think about getting a bone density test.  Let me known if you want to get tested.   Go to the lab on the way out.  We'll contact you with your lab report.  You can call for a mammogram at [facility] at [facility].  [place]  Glad to see you.

## 2015-08-06 NOTE — Assessment & Plan Note (Addendum)
Controlled, mild brady, would still continue as is with no orthostatic sx noted.  She agrees.  Recheck labs today.  See notes on labs.  >25 minutes spent in face to face time with patient, >50% spent in counselling or coordination of care.

## 2015-08-06 NOTE — Assessment & Plan Note (Signed)
No tmg, continue as is.  Recheck tsh pending.  No ADE on med.

## 2015-08-06 NOTE — Addendum Note (Signed)
Addended by: Ellamae Sia on: 08/06/2015 10:30 AM   Modules accepted: SmartSet

## 2015-08-07 ENCOUNTER — Encounter: Payer: Self-pay | Admitting: *Deleted

## 2015-08-18 ENCOUNTER — Other Ambulatory Visit (INDEPENDENT_AMBULATORY_CARE_PROVIDER_SITE_OTHER): Payer: PPO

## 2015-08-18 DIAGNOSIS — Z1211 Encounter for screening for malignant neoplasm of colon: Secondary | ICD-10-CM | POA: Diagnosis not present

## 2015-08-18 LAB — FECAL OCCULT BLOOD, IMMUNOCHEMICAL: FECAL OCCULT BLD: NEGATIVE

## 2015-08-20 ENCOUNTER — Telehealth: Payer: Self-pay | Admitting: *Deleted

## 2015-08-20 NOTE — Telephone Encounter (Signed)
Does the room spin or does she feel lightheaded? What is her BP recently? Let me know.  Thanks.

## 2015-08-20 NOTE — Telephone Encounter (Signed)
Patient says that at her last OV, you asked if she had been getting dizzy and you explained that sometimes BP caused that.  Patient now says that ever since her last appt with you, she has been getting dizzy 3 or 4 times a day.  The dizziness only lasts a few seconds.  Please advise.

## 2015-08-21 NOTE — Telephone Encounter (Signed)
Patient advised.

## 2015-08-21 NOTE — Telephone Encounter (Signed)
Patient says she just momentarily feels light-headed.  She doesn't feel like she is going to fall or anything.  Patient has not gotten any BP readings since her last OV so I instructed her to try to get some readings and report them to [place].  Patient said that she had some Meclizine 25 mg that had prescribed to her a while back and she took one last night before going to bed and slept really good.  Patient does report that she has been very fatigued for about the past 2 weeks, can hardly do anything without feeling tired quickly.  Patient's pulse at last OV was 52.  Please advise.

## 2015-08-21 NOTE — Telephone Encounter (Signed)
Have her get some BP readings and update [place].  We may need to adjust her meds.  If not better at that, then we'll need to address the fatigue.  Relatively low BP can cause lightheadedness and fatigue.  I'd like to hear about her BP before we changed anything.  Thanks.

## 2015-09-02 ENCOUNTER — Telehealth: Payer: Self-pay | Admitting: Family Medicine

## 2015-09-02 NOTE — Telephone Encounter (Signed)
Pt called with bp readings as requested. Has new bp machine.  As follows  Today- 9am 138/79 @12 :30pm 105/63 @2pm  123/63 7/11- @9 :30am 153/79 @10 :15 139/73 @12 :15pm 130/68 @3pm  159/83 @4 :30 160/86 7/10- no times given- 119/63- lowest recorded and 158/82- highest 7/9- @ 6:30am 153/85 @12 :45pm 115/65   @4pm  133/74

## 2015-09-03 NOTE — Telephone Encounter (Signed)
BP variability is noted, but overall those are pretty good.  I wouldn't change anything unless she feels poorly or is lightheaded.   Thanks for the update.  Update me as needed.

## 2015-09-03 NOTE — Telephone Encounter (Signed)
Left detailed message on voicemail.  

## 2015-09-30 ENCOUNTER — Telehealth: Payer: Self-pay | Admitting: Family Medicine

## 2015-09-30 NOTE — Telephone Encounter (Signed)
[facility] Patient Name: Jill Franco DOB: September 03, 1934 Initial Comment Caller states that she has 2 spots in her head that her hair is falling out and thinks it could be her thyroid Nurse Assessment Nurse: Martyn Ehrich, RN, Felicia Date/Time ([place] Time): 09/30/2015 3:54:47 PM Confirm and document reason for call. If symptomatic, describe symptoms. You must click the next button to save text entered. ---Pt has balding spot above forehead and one on back of head. She wants to see MD about this. The rest of hair is getting thinner onset 3 weeks ago after going to the beauty shop. No fever Has the patient traveled out of the country within the last 30 days? ---No Does the patient have any new or worsening symptoms? ---Yes Will a triage be completed? ---YesRelated visit to physician within the last 2 weeks? ---No Does the PT have any chronic conditions? (i.e. diabetes, asthma, etc.) ---Yes List chronic conditions. ---hypothyroidism, HTN Is this a behavioral health or substance abuse call? ---No Guidelines Guideline Title Affirmed Question Affirmed Notes Itching - Localized [1] Cause unknown AND [2] present > 7 days Final Disposition User See PCP When Office is Open (within 3 days) Martyn Ehrich, RN, Felicia Referrals REFERRED TO PCP OFFICE Disagree/Comply: Comply Call Id: [phone]

## 2015-10-01 ENCOUNTER — Encounter: Payer: Self-pay | Admitting: Family Medicine

## 2015-10-01 ENCOUNTER — Ambulatory Visit (INDEPENDENT_AMBULATORY_CARE_PROVIDER_SITE_OTHER): Payer: PPO | Admitting: Family Medicine

## 2015-10-01 VITALS — BP 100/60 | HR 57 | Temp 98.4°F | Wt 108.0 lb

## 2015-10-01 DIAGNOSIS — L659 Nonscarring hair loss, unspecified: Secondary | ICD-10-CM | POA: Diagnosis not present

## 2015-10-01 NOTE — Progress Notes (Signed)
Hair troubles.  Thinning.  Noted two patches on the scalp with loss.  She is having to comb over to over the spots.  Minimal scalp irritation.  She has noted some white patches in the round areas of hair loss.  She has noted some occ whitish patches on the skin in addition to the scalp.  Noted more hair loss in the last 3 weeks.    She feels well o/w.  No new meds o/w.  No FNCAVD.    No change in shampoo and skin cream.    Meds, vitals, and allergies reviewed.   ROS: Per HPI unless specifically indicated in ROS section   nad ncat but 1.5x3cm area of baldness on anterior scap, smaller area <1x1cm on posterior scalp.  Flaky scalp.  No erythema.  No ulceration.

## 2015-10-01 NOTE — Patient Instructions (Addendum)
Change over to head and shoulders shampoo in the meantime.  I want you to see the dermatology clinic.  Rosaria Ferries will call about your referral. Take care.  Glad to see you.

## 2015-10-01 NOTE — Assessment & Plan Note (Signed)
Refer to derm.  Recheck tsh wnl.  Unclear if she also has seborrhea given the flaking.  D/w pt about making one change at a time.  Change to antidandruff shampoo and get derm input.  She agrees.  She isn't systemically ill.

## 2015-10-01 NOTE — Telephone Encounter (Signed)
Pt has appt with Dr Damita Dunnings on 10/01/15 at 10:30.

## 2015-11-27 ENCOUNTER — Ambulatory Visit (INDEPENDENT_AMBULATORY_CARE_PROVIDER_SITE_OTHER): Payer: PPO

## 2015-11-27 DIAGNOSIS — Z23 Encounter for immunization: Secondary | ICD-10-CM

## 2016-01-01 ENCOUNTER — Ambulatory Visit (INDEPENDENT_AMBULATORY_CARE_PROVIDER_SITE_OTHER): Payer: PPO | Admitting: Family Medicine

## 2016-01-01 ENCOUNTER — Encounter: Payer: Self-pay | Admitting: Family Medicine

## 2016-01-01 VITALS — BP 140/92 | HR 55 | Temp 98.4°F | Wt 109.0 lb

## 2016-01-01 DIAGNOSIS — R202 Paresthesia of skin: Secondary | ICD-10-CM

## 2016-01-01 DIAGNOSIS — I739 Peripheral vascular disease, unspecified: Secondary | ICD-10-CM

## 2016-01-01 DIAGNOSIS — M545 Low back pain: Secondary | ICD-10-CM

## 2016-01-01 LAB — CBC WITH DIFFERENTIAL/PLATELET
BASOS PCT: 1 %
Basophils Absolute: 77 cells/uL (ref 0–200)
EOS ABS: 154 {cells}/uL (ref 15–500)
Eosinophils Relative: 2 %
HEMATOCRIT: 48.7 % — AB (ref 35.0–45.0)
Hemoglobin: 17 g/dL — ABNORMAL HIGH (ref 11.7–15.5)
LYMPHS PCT: 19 %
Lymphs Abs: 1463 cells/uL (ref 850–3900)
MCH: 32.9 pg (ref 27.0–33.0)
MCHC: 34.9 g/dL (ref 32.0–36.0)
MCV: 94.2 fL (ref 80.0–100.0)
MONO ABS: 924 {cells}/uL (ref 200–950)
MPV: 9.7 fL (ref 7.5–12.5)
Monocytes Relative: 12 %
NEUTROS ABS: 5082 {cells}/uL (ref 1500–7800)
Neutrophils Relative %: 66 %
Platelets: 195 10*3/uL (ref 140–400)
RBC: 5.17 MIL/uL — ABNORMAL HIGH (ref 3.80–5.10)
RDW: 13.4 % (ref 11.0–15.0)
WBC: 7.7 10*3/uL (ref 3.8–10.8)

## 2016-01-01 NOTE — Progress Notes (Signed)
Pre visit review using our clinic review tool, if applicable. No additional management support is needed unless otherwise documented below in the visit note. 

## 2016-01-01 NOTE — Progress Notes (Signed)
Back and leg pain.  Also with L arm tingling.    She was having a lot of leg pain initially, circumferential from the knees down B, going on about 1 month.  Then back pain started yesterday.  H/o laminectomy and disc surgery in the 1960s.  She doesn't have typical radicular sx.  Her back pain is some better now.  Leg pain is worse walking, better resting.  No clear trigger for back pain.  Now she can walk about 150 ft and then her lower legs hurt.  If she rests the pain gets better.    She didn't have these troubles back in 09/2015 at last OV.  No CP, not SOB.  Smoker.  Tingling left arm was noted after she got a flu shot. It is slowly getting better. She has no weakness. She has no other similar symptoms on the trunk or any other extremities.  PMH and SH reviewed  ROS: Per HPI.  Unless specifically indicated otherwise in HPI, the patient denies:  General: fever. Eyes: acute vision changes ENT: sore throat Cardiovascular: chest pain Respiratory: SOB GI: vomiting GU: dysuria Musculoskeletal: acute back pain Derm: acute rash Neuro: acute motor dysfunction Psych: worsening mood Endocrine: polydipsia Heme: bleeding  Meds, vitals, and allergies reviewed.   GEN: nad, alert and oriented HEENT: mucous membranes moist NECK: supple w/o LA CV: rrr.  PULM: ctab, no inc wob ABD: soft, +bs EXT: no edema SKIN: no acute rash, no skin breakdown on the feet S/S wnl x4 but dec DP pulses B.   Back not ttp in the midline, no cva pain, no bruising.

## 2016-01-01 NOTE — Patient Instructions (Signed)
Go to the lab on the way out.  We'll contact you with your lab report. Rosaria Ferries will call about your referral. Limit walking in the meantime.  Ice your back as needed.  I think your back is not causing your leg pain.  Take care.  Glad to see you.

## 2016-01-03 DIAGNOSIS — I739 Peripheral vascular disease, unspecified: Secondary | ICD-10-CM | POA: Insufficient documentation

## 2016-01-03 DIAGNOSIS — M549 Dorsalgia, unspecified: Secondary | ICD-10-CM | POA: Insufficient documentation

## 2016-01-03 DIAGNOSIS — R202 Paresthesia of skin: Secondary | ICD-10-CM | POA: Insufficient documentation

## 2016-01-03 NOTE — Assessment & Plan Note (Signed)
This happened only in the left arm after her flu shot. She could've had a peripheral nerve irritation. Discussed with patient. Is gradually getting better. Update me as needed. This does not appear to be a contraindication to future flu vaccination.

## 2016-01-03 NOTE — Assessment & Plan Note (Signed)
Smoker. With clear claudication symptoms in the lower extremities, from the knees distally. Symmetric bilaterally. She has decreased dorsalis pedis pulses. She does not have skin ulceration or breakdown. I can still feel faint dorsalis pedis pulses bilaterally. Discussed with patient. Smoking cessation encouraged. Will set up arterial Dopplers. Will likely need vascular referral. All discussed with patient. She agrees with plan. She isn't short of breath or having chest pain. Okay for outpatient follow-up

## 2016-01-03 NOTE — Assessment & Plan Note (Signed)
History of laminectomy. It is likely that she has some arthritic changes in her back with associated muscle spasm causing her most recent back pain. No radicular symptoms. No bowel or bladder symptoms. I think this is a separate issue from her legs and her back pain is likely not causing the leg pain. It could be that the claudication symptoms she has had recently could be affecting her gait enough to cause some back discomfort. Discussed with patient. See AVS and other planning.

## 2016-01-04 ENCOUNTER — Ambulatory Visit: Payer: PPO | Admitting: Family Medicine

## 2016-01-04 ENCOUNTER — Telehealth: Payer: Self-pay | Admitting: Family Medicine

## 2016-01-04 DIAGNOSIS — I739 Peripheral vascular disease, unspecified: Secondary | ICD-10-CM

## 2016-01-04 NOTE — Telephone Encounter (Signed)
Pt now schedule for 11/20. Pt will call in middle of week to [facility] to see if there are any cancellations.   Pt aware of appt day time and location

## 2016-01-04 NOTE — Telephone Encounter (Signed)
Spoke to [facility]. They can see pt Thurs for the LE Artierial but, order needs to be changed to [facility] 2193.  Please advise

## 2016-01-04 NOTE — Telephone Encounter (Signed)
Ordered IMG 2193.  Old order cancelled. Thanks.

## 2016-01-11 ENCOUNTER — Encounter: Payer: Self-pay | Admitting: Family Medicine

## 2016-01-11 ENCOUNTER — Other Ambulatory Visit: Payer: Self-pay | Admitting: Family Medicine

## 2016-01-11 ENCOUNTER — Ambulatory Visit
Admission: RE | Admit: 2016-01-11 | Discharge: 2016-01-11 | Disposition: A | Discharge status: Home / Self Care | Payer: PPO | Source: Ambulatory Visit | Attending: Family Medicine | Admitting: Family Medicine

## 2016-01-11 DIAGNOSIS — M79606 Pain in leg, unspecified: Secondary | ICD-10-CM | POA: Insufficient documentation

## 2016-01-11 DIAGNOSIS — I739 Peripheral vascular disease, unspecified: Secondary | ICD-10-CM

## 2016-01-11 DIAGNOSIS — M79604 Pain in right leg: Secondary | ICD-10-CM | POA: Insufficient documentation

## 2016-01-13 ENCOUNTER — Other Ambulatory Visit: Payer: Self-pay | Admitting: Family Medicine

## 2016-01-13 DIAGNOSIS — E785 Hyperlipidemia, unspecified: Secondary | ICD-10-CM

## 2016-01-13 MED ORDER — ATORVASTATIN CALCIUM 10 MG PO TABS: 10.0000 mg | ORAL_TABLET | Freq: Every day | ORAL | 3 refills | Status: DC

## 2016-01-18 ENCOUNTER — Telehealth: Payer: Self-pay

## 2016-01-18 NOTE — Telephone Encounter (Signed)
Pt left v/m; pt took cholesterol med over the holidays and caused pt to have a lot of pain in her legs to the point pt could not sleep at night. Pt stopped the atorvastatin and since stopping the atorvastatin pt is not having any leg pain. Pt request cb with what to do next. [facility] [place].

## 2016-01-19 MED ORDER — PRAVASTATIN SODIUM 10 MG PO TABS: 10.0000 mg | ORAL_TABLET | Freq: Every day | ORAL | 3 refills | Status: DC

## 2016-01-19 NOTE — Telephone Encounter (Signed)
Patient advised.

## 2016-01-19 NOTE — Telephone Encounter (Signed)
Would try low dose of pravastatin to see if she can tolerate- less likely to cause aches.  rx sent.  It aches on pravastatin, then stop med.  She did the right thing by trying and then stopping lipitor.   Thanks.

## 2016-02-02 ENCOUNTER — Other Ambulatory Visit: Payer: Self-pay

## 2016-02-02 DIAGNOSIS — I739 Peripheral vascular disease, unspecified: Secondary | ICD-10-CM

## 2016-03-04 ENCOUNTER — Encounter: Payer: Self-pay | Admitting: Vascular Surgery

## 2016-03-09 ENCOUNTER — Ambulatory Visit (HOSPITAL_COMMUNITY): Payer: PPO

## 2016-03-09 ENCOUNTER — Encounter: Payer: PPO | Admitting: Vascular Surgery

## 2016-03-09 ENCOUNTER — Encounter (HOSPITAL_COMMUNITY): Payer: PPO

## 2016-03-16 ENCOUNTER — Ambulatory Visit (INDEPENDENT_AMBULATORY_CARE_PROVIDER_SITE_OTHER): Payer: Medicare Other | Admitting: Family Medicine

## 2016-03-16 ENCOUNTER — Encounter: Payer: Self-pay | Admitting: Family Medicine

## 2016-03-16 VITALS — BP 122/80 | HR 70 | Temp 99.1°F | Wt 110.5 lb

## 2016-03-16 DIAGNOSIS — F17200 Nicotine dependence, unspecified, uncomplicated: Secondary | ICD-10-CM | POA: Diagnosis not present

## 2016-03-16 DIAGNOSIS — L03119 Cellulitis of unspecified part of limb: Secondary | ICD-10-CM | POA: Insufficient documentation

## 2016-03-16 DIAGNOSIS — I739 Peripheral vascular disease, unspecified: Secondary | ICD-10-CM | POA: Diagnosis not present

## 2016-03-16 LAB — CBC WITH DIFFERENTIAL/PLATELET
BASOS PCT: 0.4 % (ref 0.0–3.0)
Basophils Absolute: 0 10*3/uL (ref 0.0–0.1)
EOS ABS: 0.1 10*3/uL (ref 0.0–0.7)
Eosinophils Relative: 0.9 % (ref 0.0–5.0)
HCT: 42 % (ref 36.0–46.0)
Hemoglobin: 14.2 g/dL (ref 12.0–15.0)
Lymphocytes Relative: 12.4 % (ref 12.0–46.0)
Lymphs Abs: 1.4 10*3/uL (ref 0.7–4.0)
MCHC: 33.7 g/dL (ref 30.0–36.0)
MCV: 92.8 fl (ref 78.0–100.0)
MONO ABS: 1.2 10*3/uL — AB (ref 0.1–1.0)
Monocytes Relative: 10.7 % (ref 3.0–12.0)
NEUTROS ABS: 8.8 10*3/uL — AB (ref 1.4–7.7)
NEUTROS PCT: 75.6 % (ref 43.0–77.0)
PLATELETS: 294 10*3/uL (ref 150.0–400.0)
RBC: 4.53 Mil/uL (ref 3.87–5.11)
RDW: 12.7 % (ref 11.5–15.5)
WBC: 11.6 10*3/uL — ABNORMAL HIGH (ref 4.0–10.5)

## 2016-03-16 MED ORDER — CEFTRIAXONE SODIUM 1 G IJ SOLR
1.0000 g | Freq: Once | INTRAMUSCULAR | Status: AC
  Administered 2016-03-16: 1 g via INTRAMUSCULAR

## 2016-03-16 MED ORDER — DOXYCYCLINE HYCLATE 100 MG PO TABS
100.0000 mg | ORAL_TABLET | Freq: Two times a day (BID) | ORAL | 0 refills | Status: DC
Start: 2016-03-16 — End: 2016-06-17

## 2016-03-16 NOTE — Assessment & Plan Note (Signed)
Complicated by PVD.  D/w pt.  Rocephin today, start doxy.  Check CBC, update me in a few days.  If worse to ER.  See claudication discussion.

## 2016-03-16 NOTE — Patient Instructions (Signed)
Start doxycycline today.  Update me in about 1-2 days.  If worse, go to the ER.  Go to the lab on the way out.  We'll contact you with your lab report. Jill Franco will call about your vascular surgery appointment- see her on the way out and see if she can get your appointment moved up (foot cellulitis, claudication, discolored toes).  Try flavored OTC nicotine gum, instead of a cigarette.  Take care.  Glad to see you.

## 2016-03-16 NOTE — Assessment & Plan Note (Signed)
See above and see AVS.  dw pt about using gum as replacement, since she is down to a few cigs a day.  I thanked her for her effort.

## 2016-03-16 NOTE — Progress Notes (Signed)
Pre visit review using our clinic review tool, if applicable. No additional management support is needed unless otherwise documented below in the visit note. 
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
## 2016-03-16 NOTE — Assessment & Plan Note (Signed)
She has sluggish refill on the toes and h/o dec DP pulse; similar on B feet.  Her exercise tolerance is increased with smoking less and she doesn't appear to have critical ischemia at this point, ie doesn't appear to need ER eval at this point, but needs her vascular appointment moved up.  D/w pt and asked for staff to help move the appointment sooner.  App help of all involved.  She is on statin and smoking less. D/w pt. >25 minutes spent in face to face time with patient, >50% spent in counselling or coordination of care.

## 2016-03-16 NOTE — Addendum Note (Signed)
Addended by: Emelia Salisbury C on: 03/16/2016 03:28 PM   Modules accepted: Orders

## 2016-03-17 ENCOUNTER — Ambulatory Visit (HOSPITAL_COMMUNITY)
Admission: RE | Admit: 2016-03-17 | Discharge: 2016-03-17 | Disposition: A | Discharge status: Home / Self Care | Payer: Medicare Other | Source: Ambulatory Visit | Attending: Vascular Surgery | Admitting: Vascular Surgery

## 2016-03-17 ENCOUNTER — Other Ambulatory Visit: Payer: Self-pay | Admitting: *Deleted

## 2016-03-17 ENCOUNTER — Encounter: Payer: Self-pay | Admitting: *Deleted

## 2016-03-17 ENCOUNTER — Encounter: Payer: Self-pay | Admitting: Vascular Surgery

## 2016-03-17 ENCOUNTER — Ambulatory Visit (INDEPENDENT_AMBULATORY_CARE_PROVIDER_SITE_OTHER): Payer: Medicare Other | Admitting: Vascular Surgery

## 2016-03-17 VITALS — BP 139/78 | HR 71 | Temp 98.4°F | Resp 18 | Ht 65.0 in | Wt 110.3 lb

## 2016-03-17 DIAGNOSIS — R9439 Abnormal result of other cardiovascular function study: Secondary | ICD-10-CM | POA: Insufficient documentation

## 2016-03-17 DIAGNOSIS — I743 Embolism and thrombosis of arteries of the lower extremities: Secondary | ICD-10-CM | POA: Insufficient documentation

## 2016-03-17 DIAGNOSIS — I739 Peripheral vascular disease, unspecified: Secondary | ICD-10-CM

## 2016-03-17 NOTE — Progress Notes (Signed)
Referring Physician: Dr Damita Dunnings  Patient name: Jill Franco MRN: [id number] DOB: 03-08-1934 Sex: female  REASON FOR CONSULT: Bilateral leg pain with nonhealing ulcer right ankle  HPI: Jill Franco is a 82 y.o. female with a several month history of pain and tightness in her calves when walking about one block. Several weeks ago she developed swelling in the right ankle and an ulcer. This is currently being treated with doxycycline. The patient occasionally also has symptoms which suggestive of rest pain with pain in both feet at nighttime on occasion. Right foot is worse than the left but she thinks this is due to the infection in her ankle. She is a current smoker. Greater than 3 minutes regarding smoking cessation counseling. Other medical problems include hypertension hyperlipidemia both of which are currently stable. She is on aspirin and a statin. She denies prior history of stroke or TIA or myocardial infarction. She denies chest pain. She has shortness of breath with less than one flight of stairs.  Past Medical History:  Diagnosis Date  . History of shingles 2012  . HLD (hyperlipidemia)   . HTN (hypertension)   . Hypothyroidism    Past Surgical History:  Procedure Laterality Date  . ABDOMINAL EXPLORATION SURGERY    . APPENDECTOMY  childhood  . BREAST LUMPECTOMY  1960's   benign  . carotid ultrasound  06/19/2007   0-39% stenosis  . DILATION AND CURETTAGE OF UTERUS  1960's   miscarriage  . DOPPLER ECHOCARDIOGRAPHY  06/19/2007   Normal EF 55-70%  . LAMINECTOMY AND MICRODISCECTOMY SPINE  1960's  . NSVD     x1    Family History  Problem Relation Age of Onset  . Stroke Father 55  . Cancer Brother   . Colon cancer Neg Hx   . Breast cancer Neg Hx     SOCIAL HISTORY: Social History   Social History  . Marital status: Widowed    Spouse name: N/A  . Number of children: 1  . Years of education: N/A   Occupational History  . 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 Smoking status: Current Every Day Smoker    Packs/day: 0.33    Years: 58.00    Types: Cigarettes  . Smokeless tobacco: Never Used  . Alcohol use No  . Drug use: No  . Sexual activity: No   Other Topics Concern  . Not on file   Social History Narrative   From [employer]   Retired 2007   Widowed 2008 after 27 years   Enjoys yard work   Her daughter is helping at home some (some as of 2016)    Allergies  Allergen Reactions  . Aspirin     REACTION: Uncoated Stomach upset on empty stomach  . Codeine     REACTION: UNSPECIFIED  . Lipitor [Atorvastatin] Other (See Comments)    myalgia    Current Outpatient Prescriptions  Medication Sig Dispense Refill  . aspirin 81 MG EC tablet Take 162 mg by mouth daily.      . enalapril (VASOTEC) 20 MG tablet Take 1 tablet (20 mg total) by mouth daily. 90 tablet 3  . hydrochlorothiazide (MICROZIDE) 12.5 MG capsule Take 1 capsule (12.5 mg total) by mouth every morning. 90 capsule 3  . levothyroxine (SYNTHROID, LEVOTHROID) 50 MCG tablet Take 1 tablet (50 mcg total) by mouth daily. 90 tablet 3  . metoprolol (LOPRESSOR) 100 MG tablet 1/2 in  the morning and 1 in the evening 135 tablet 3  . pravastatin (PRAVACHOL) 10 MG tablet Take 1 tablet (10 mg total) by mouth daily. 90 tablet 3  . doxycycline (VIBRA-TABS) 100 MG tablet Take 1 tablet (100 mg total) by mouth 2 (two) times daily. (Patient not taking: Reported on 03/17/2016) 20 tablet 0   No current facility-administered medications for this visit.     ROS:   General:  No weight loss, Fever, chills  HEENT: No recent headaches, no nasal bleeding, no visual changes, no sore throat  Neurologic: No dizziness, blackouts, seizures. No recent symptoms of stroke or mini- stroke. No recent episodes of slurred speech, or temporary blindness.  Cardiac: No recent episodes of chest pain/pressure, no shortness of breath at rest.  + shortness of breath with exertion.   Denies history of atrial fibrillation or irregular heartbeat  Vascular: + history of rest pain in feet.  + history of claudication.  + history of non-healing ulcer, No history of DVT   Pulmonary: No home oxygen, + productive cough, no hemoptysis,  No asthma or wheezing  Musculoskeletal:  [X]  Arthritis, [X]  Low back pain,  [X]  Joint pain  Hematologic:No history of hypercoagulable state.  No history of easy bleeding.  No history of anemia  Gastrointestinal: No hematochezia or melena,  No gastroesophageal reflux, no trouble swallowing  Urinary: [ ]  chronic Kidney disease, [ ]  on HD - [ ]  MWF or [ ]  TTHS, [ ]  Burning with urination, [ ]  Frequent urination, [ ]  Difficulty urinating;   Skin: No rashes  Psychological: No history of anxiety,  No history of depression   Physical Examination  Vitals:   03/17/16 1459  BP: 139/78  Pulse: 71  Resp: 18  Temp: 98.4 F (36.9 C)  TempSrc: Oral  SpO2: 99%  Weight: 110 lb 4.8 oz (50 kg)  Height: 5\' 5"  (1.651 m)    Body mass index is 18.35 kg/m.  General:  Alert and oriented, no acute distress HEENT: Normal Neck: harsh left carotid bruit no JVD Pulmonary: Clear to auscultation bilaterally, distant breath sounds Cardiac: Regular Rate and Rhythm without murmur, distant heart sounds  Abdomen: Soft, non-tender, non-distended, no mass Skin: No rash, 2 mm ulceration with some surrounding erythema right lateral malleolus mild right ankle edema compared to left Extremity Pulses:  2+ radial, brachial,1+  femoral, absent popliteal dorsalis pedis, posterior tibial pulses bilaterally Musculoskeletal: No deformity   Neurologic: Upper and lower extremity motor 5/5 and symmetric  DATA:  Patient had bilateral ABIs performed previously [facility] imaging November 20. Right side was 0.5 left 0.59. She had a lower extremity arterial duplex exam today which I reviewed and interpreted. 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 Right superficial femoral artery appeared occluded. Left superficial femoral artery had a 75% stenosis. Minimal flow noted in the tibial vessels left leg. Monophasic flow on the right leg.  ASSESSMENT:  Bilateral lower extremity ischemia with claudication and early rest pain and now nonhealing wound right ankle. The patient he is at risk of limb loss. She was counseled today for greater than 3 minutes regarding smoking cessation and a she continues to smoke her risk of limb loss may be significant.  Patient also has a left carotid bruit that when he carotid duplex examination. She will also need this especially if she ends up needing operation.   Right ankle swelling most likely infection continue doxycycline  PLAN:  Aortogram bilateral  lower extremity runoff possible intervention tomorrow. Risks benefits possible complications  and procedure details were discussed the patient today including but not limited to bleeding infection vessel injury. She wishes to proceed.   Ruta Hinds, MD [facility] Office: [phone] Pager: [phone]

## 2016-03-18 ENCOUNTER — Inpatient Hospital Stay (HOSPITAL_COMMUNITY)
Admission: RE | Admit: 2016-03-18 | Discharge: 2016-03-18 | DRG: 253 | Disposition: A | Discharge status: Home / Self Care | Payer: Medicare Other | Source: Ambulatory Visit | Attending: Vascular Surgery | Admitting: Vascular Surgery

## 2016-03-18 ENCOUNTER — Encounter (HOSPITAL_COMMUNITY)
Admission: RE | Disposition: A | Discharge status: Home / Self Care | Payer: Self-pay | Source: Ambulatory Visit | Attending: Vascular Surgery

## 2016-03-18 ENCOUNTER — Encounter (HOSPITAL_COMMUNITY): Payer: Self-pay | Admitting: General Practice

## 2016-03-18 DIAGNOSIS — E785 Hyperlipidemia, unspecified: Secondary | ICD-10-CM | POA: Diagnosis not present

## 2016-03-18 DIAGNOSIS — L97319 Non-pressure chronic ulcer of right ankle with unspecified severity: Secondary | ICD-10-CM | POA: Diagnosis not present

## 2016-03-18 DIAGNOSIS — Z823 Family history of stroke: Secondary | ICD-10-CM | POA: Diagnosis not present

## 2016-03-18 DIAGNOSIS — I739 Peripheral vascular disease, unspecified: Secondary | ICD-10-CM | POA: Diagnosis not present

## 2016-03-18 DIAGNOSIS — F17210 Nicotine dependence, cigarettes, uncomplicated: Secondary | ICD-10-CM | POA: Diagnosis not present

## 2016-03-18 DIAGNOSIS — F17200 Nicotine dependence, unspecified, uncomplicated: Secondary | ICD-10-CM | POA: Diagnosis not present

## 2016-03-18 DIAGNOSIS — I10 Essential (primary) hypertension: Secondary | ICD-10-CM | POA: Diagnosis present

## 2016-03-18 DIAGNOSIS — I70213 Atherosclerosis of native arteries of extremities with intermittent claudication, bilateral legs: Secondary | ICD-10-CM | POA: Diagnosis not present

## 2016-03-18 DIAGNOSIS — E039 Hypothyroidism, unspecified: Secondary | ICD-10-CM | POA: Diagnosis not present

## 2016-03-18 DIAGNOSIS — Z981 Arthrodesis status: Secondary | ICD-10-CM | POA: Diagnosis not present

## 2016-03-18 DIAGNOSIS — Z7982 Long term (current) use of aspirin: Secondary | ICD-10-CM

## 2016-03-18 HISTORY — PX: ILIAC ARTERY STENT: SHX1786

## 2016-03-18 HISTORY — PX: PERIPHERAL VASCULAR CATHETERIZATION: SHX172C

## 2016-03-18 HISTORY — DX: Peripheral vascular disease, unspecified: I73.9

## 2016-03-18 LAB — POCT I-STAT, CHEM 8
BUN: 22 mg/dL — AB (ref 6–20)
CREATININE: 0.7 mg/dL (ref 0.44–1.00)
Calcium, Ion: 1.13 mmol/L — ABNORMAL LOW (ref 1.15–1.40)
Chloride: 98 mmol/L — ABNORMAL LOW (ref 101–111)
Glucose, Bld: 94 mg/dL (ref 65–99)
HEMATOCRIT: 44 % (ref 36.0–46.0)
Hemoglobin: 15 g/dL (ref 12.0–15.0)
POTASSIUM: 4 mmol/L (ref 3.5–5.1)
SODIUM: 136 mmol/L (ref 135–145)
TCO2: 29 mmol/L (ref 0–100)

## 2016-03-18 LAB — POCT ACTIVATED CLOTTING TIME
ACTIVATED CLOTTING TIME: 235 s
ACTIVATED CLOTTING TIME: 197 s
ACTIVATED CLOTTING TIME: 175 s

## 2016-03-18 SURGERY — ABDOMINAL AORTOGRAM W/LOWER EXTREMITY
Anesthesia: LOCAL

## 2016-03-18 MED ORDER — METOPROLOL TARTRATE 5 MG/5ML IV SOLN: 2.0000 mg | INTRAVENOUS | Status: DC | PRN

## 2016-03-18 MED ORDER — CLOPIDOGREL BISULFATE 300 MG PO TABS
ORAL_TABLET | ORAL | Status: DC | PRN
  Administered 2016-03-18: 300 mg via ORAL

## 2016-03-18 MED ORDER — LIDOCAINE HCL (PF) 1 % IJ SOLN
INTRAMUSCULAR | Status: AC
  Filled 2016-03-18: qty 30

## 2016-03-18 MED ORDER — HYDRALAZINE HCL 20 MG/ML IJ SOLN
INTRAMUSCULAR | Status: AC
  Filled 2016-03-18: qty 1

## 2016-03-18 MED ORDER — OXYCODONE HCL 5 MG PO TABS
ORAL_TABLET | ORAL | Status: AC
  Filled 2016-03-18: qty 1

## 2016-03-18 MED ORDER — CLOPIDOGREL BISULFATE 75 MG PO TABS: 75.0000 mg | ORAL_TABLET | Freq: Every day | ORAL | 11 refills | Status: DC

## 2016-03-18 MED ORDER — CLOPIDOGREL BISULFATE 75 MG PO TABS: 75.0000 mg | ORAL_TABLET | Freq: Every day | ORAL | Status: DC

## 2016-03-18 MED ORDER — HEPARIN SODIUM (PORCINE) 1000 UNIT/ML IJ SOLN
INTRAMUSCULAR | Status: AC
  Filled 2016-03-18: qty 1

## 2016-03-18 MED ORDER — HEPARIN (PORCINE) IN NACL 2-0.9 UNIT/ML-% IJ SOLN
INTRAMUSCULAR | Status: AC
  Filled 2016-03-18: qty 1000

## 2016-03-18 MED ORDER — SODIUM CHLORIDE 0.45 % IV SOLN
INTRAVENOUS | Status: DC
  Administered 2016-03-18: 16:00:00 via INTRAVENOUS

## 2016-03-18 MED ORDER — CLOPIDOGREL BISULFATE 75 MG PO TABS
300.0000 mg | ORAL_TABLET | Freq: Once | ORAL | Status: AC
  Administered 2016-03-18: 300 mg via ORAL
  Filled 2016-03-18: qty 4

## 2016-03-18 MED ORDER — IODIXANOL 320 MG/ML IV SOLN
INTRAVENOUS | Status: DC | PRN
  Administered 2016-03-18: 125 mL via INTRA_ARTERIAL

## 2016-03-18 MED ORDER — ANGIOPLASTY BOOK
Freq: Once | Status: AC
  Administered 2016-03-18: 23:00:00
  Filled 2016-03-18: qty 1

## 2016-03-18 MED ORDER — OXYCODONE HCL 5 MG PO TABS
5.0000 mg | ORAL_TABLET | ORAL | Status: DC | PRN
  Administered 2016-03-18 (×2): 5 mg via ORAL

## 2016-03-18 MED ORDER — LABETALOL HCL 5 MG/ML IV SOLN: 10.0000 mg | INTRAVENOUS | Status: DC | PRN

## 2016-03-18 MED ORDER — MORPHINE SULFATE (PF) 2 MG/ML IV SOLN: 2.0000 mg | INTRAVENOUS | Status: DC | PRN

## 2016-03-18 MED ORDER — ONDANSETRON HCL 4 MG/2ML IJ SOLN: 4.0000 mg | Freq: Four times a day (QID) | INTRAMUSCULAR | Status: DC | PRN

## 2016-03-18 MED ORDER — ACETAMINOPHEN 325 MG PO TABS
325.0000 mg | ORAL_TABLET | ORAL | Status: DC | PRN
Start: 2016-03-18 — End: 2016-03-19

## 2016-03-18 MED ORDER — HEPARIN SODIUM (PORCINE) 1000 UNIT/ML IJ SOLN
INTRAMUSCULAR | Status: DC | PRN
  Administered 2016-03-18: 5000 [IU] via INTRAVENOUS

## 2016-03-18 MED ORDER — SODIUM CHLORIDE 0.9 % IV SOLN: INTRAVENOUS | Status: DC

## 2016-03-18 MED ORDER — SODIUM CHLORIDE 0.9 % IV SOLN: 500.0000 mL | Freq: Once | INTRAVENOUS | Status: DC | PRN

## 2016-03-18 MED ORDER — ACETAMINOPHEN 650 MG RE SUPP: 325.0000 mg | RECTAL | Status: DC | PRN

## 2016-03-18 MED ORDER — HEPARIN (PORCINE) IN NACL 2-0.9 UNIT/ML-% IJ SOLN
INTRAMUSCULAR | Status: DC | PRN
  Administered 2016-03-18: 1000 mL

## 2016-03-18 MED ORDER — LIDOCAINE HCL (PF) 1 % IJ SOLN
INTRAMUSCULAR | Status: DC | PRN
  Administered 2016-03-18: 20 mL via INTRADERMAL

## 2016-03-18 MED ORDER — HYDRALAZINE HCL 20 MG/ML IJ SOLN
5.0000 mg | INTRAMUSCULAR | Status: DC | PRN
  Administered 2016-03-18: 5 mg via INTRAVENOUS

## 2016-03-18 SURGICAL SUPPLY — 17 items
BALLN MUSTANG 6.0X40 75 (BALLOONS) ×3
BALLOON MUSTANG 6.0X40 75 (BALLOONS) IMPLANT
CATH ANGIO 5F BER2 65CM (CATHETERS) ×1 IMPLANT
CATH OMNI FLUSH 5F 65CM (CATHETERS) ×1 IMPLANT
DEVICE CONTINUOUS FLUSH (MISCELLANEOUS) ×1 IMPLANT
GUIDEWIRE ANGLED .035X150CM (WIRE) ×2 IMPLANT
KIT ENCORE 26 ADVANTAGE (KITS) ×1 IMPLANT
KIT PV (KITS) ×3 IMPLANT
SHEATH BRITE TIP 7FR 35CM (SHEATH) ×1 IMPLANT
SHEATH PINNACLE 5F 10CM (SHEATH) ×1 IMPLANT
STENT INNOVA 6X40X130 (Permanent Stent) ×1 IMPLANT
STENT OMNILINK ELITE 10X19X80 (Permanent Stent) ×1 IMPLANT
SYR MEDRAD MARK V 150ML (SYRINGE) ×3 IMPLANT
TRANSDUCER W/STOPCOCK (MISCELLANEOUS) ×3 IMPLANT
TRAY PV CATH (CUSTOM PROCEDURE TRAY) ×3 IMPLANT
WIRE HITORQ VERSACORE ST 145CM (WIRE) ×1 IMPLANT
WIRE VERSACORE LOC 115CM (WIRE) ×1 IMPLANT

## 2016-03-18 NOTE — Progress Notes (Signed)
Site area: left groin Site Prior to Removal:  Level 0 Pressure Applied For:  25 minutes Manual:   yes Patient Status During Pull:  stable Post Pull Site:  Level  0 Post Pull Instructions Given:  yes Post Pull Pulses Present: yes Dressing Applied:  tegaderm Bedrest begins @  1720 Comments:

## 2016-03-18 NOTE — Interval H&P Note (Signed)
History and Physical Interval Note:  03/18/2016 12:43 PM  [name]  has presented today for surgery, with the diagnosis of pvd with ulcer  The various methods of treatment have been discussed with the patient and family. After consideration of risks, benefits and other options for treatment, the patient has consented to  Procedure(s): Abdominal Aortogram w/Lower Extremity (N/A) as a surgical intervention .  The patient's history has been reviewed, patient examined, no change in status, stable for surgery.  I have reviewed the patient's chart and labs.  Questions were answered to the patient's satisfaction.     Ruta Hinds

## 2016-03-18 NOTE — H&P (View-Only) (Signed)
Referring Physician: Dr Damita Dunnings  Patient name: Jill Franco MRN: [id number] DOB: 04/26/1934 Sex: female  REASON FOR CONSULT: Bilateral leg pain with nonhealing ulcer right ankle  HPI: Jill Franco is a 82 y.o. female with a several month history of pain and tightness in her calves when walking about one block. Several weeks ago she developed swelling in the right ankle and an ulcer. This is currently being treated with doxycycline. The patient occasionally also has symptoms which suggestive of rest pain with pain in both feet at nighttime on occasion. Right foot is worse than the left but she thinks this is due to the infection in her ankle. She is a current smoker. Greater than 3 minutes regarding smoking cessation counseling. Other medical problems include hypertension hyperlipidemia both of which are currently stable. She is on aspirin and a statin. She denies prior history of stroke or TIA or myocardial infarction. She denies chest pain. She has shortness of breath with less than one flight of stairs.  Past Medical History:  Diagnosis Date  . History of shingles 2012  . HLD (hyperlipidemia)   . HTN (hypertension)   . Hypothyroidism    Past Surgical History:  Procedure Laterality Date  . ABDOMINAL EXPLORATION SURGERY    . APPENDECTOMY  childhood  . BREAST LUMPECTOMY  1960's   benign  . carotid ultrasound  06/19/2007   0-39% stenosis  . DILATION AND CURETTAGE OF UTERUS  1960's   miscarriage  . DOPPLER ECHOCARDIOGRAPHY  06/19/2007   Normal EF 55-70%  . LAMINECTOMY AND MICRODISCECTOMY SPINE  1960's  . NSVD     x1    Family History  Problem Relation Age of Onset  . Stroke Father 66  . Cancer Brother   . Colon cancer Neg Hx   . Breast cancer Neg Hx     SOCIAL HISTORY: Social History   Social History  . Marital status: Widowed    Spouse name: N/A  . Number of children: 1  . Years of education: N/A   Occupational History  . 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 Smoking status: Current Every Day Smoker    Packs/day: 0.33    Years: 58.00    Types: Cigarettes  . Smokeless tobacco: Never Used  . Alcohol use No  . Drug use: No  . Sexual activity: No   Other Topics Concern  . Not on file   Social History Narrative   From [employer]   Retired 2007   Widowed 2008 after 70 years   Enjoys yard work   Her daughter is helping at home some (some as of 2016)    Allergies  Allergen Reactions  . Aspirin     REACTION: Uncoated Stomach upset on empty stomach  . Codeine     REACTION: UNSPECIFIED  . Lipitor [Atorvastatin] Other (See Comments)    myalgia    Current Outpatient Prescriptions  Medication Sig Dispense Refill  . aspirin 81 MG EC tablet Take 162 mg by mouth daily.      . enalapril (VASOTEC) 20 MG tablet Take 1 tablet (20 mg total) by mouth daily. 90 tablet 3  . hydrochlorothiazide (MICROZIDE) 12.5 MG capsule Take 1 capsule (12.5 mg total) by mouth every morning. 90 capsule 3  . levothyroxine (SYNTHROID, LEVOTHROID) 50 MCG tablet Take 1 tablet (50 mcg total) by mouth daily. 90 tablet 3  . metoprolol (LOPRESSOR) 100 MG tablet 1/2 in  the morning and 1 in the evening 135 tablet 3  . pravastatin (PRAVACHOL) 10 MG tablet Take 1 tablet (10 mg total) by mouth daily. 90 tablet 3  . doxycycline (VIBRA-TABS) 100 MG tablet Take 1 tablet (100 mg total) by mouth 2 (two) times daily. (Patient not taking: Reported on 03/17/2016) 20 tablet 0   No current facility-administered medications for this visit.     ROS:   General:  No weight loss, Fever, chills  HEENT: No recent headaches, no nasal bleeding, no visual changes, no sore throat  Neurologic: No dizziness, blackouts, seizures. No recent symptoms of stroke or mini- stroke. No recent episodes of slurred speech, or temporary blindness.  Cardiac: No recent episodes of chest pain/pressure, no shortness of breath at rest.  + shortness of breath with exertion.   Denies history of atrial fibrillation or irregular heartbeat  Vascular: + history of rest pain in feet.  + history of claudication.  + history of non-healing ulcer, No history of DVT   Pulmonary: No home oxygen, + productive cough, no hemoptysis,  No asthma or wheezing  Musculoskeletal:  [X]  Arthritis, [X]  Low back pain,  [X]  Joint pain  Hematologic:No history of hypercoagulable state.  No history of easy bleeding.  No history of anemia  Gastrointestinal: No hematochezia or melena,  No gastroesophageal reflux, no trouble swallowing  Urinary: [ ]  chronic Kidney disease, [ ]  on HD - [ ]  MWF or [ ]  TTHS, [ ]  Burning with urination, [ ]  Frequent urination, [ ]  Difficulty urinating;   Skin: No rashes  Psychological: No history of anxiety,  No history of depression   Physical Examination  Vitals:   03/17/16 1459  BP: 139/78  Pulse: 71  Resp: 18  Temp: 98.4 F (36.9 C)  TempSrc: Oral  SpO2: 99%  Weight: 110 lb 4.8 oz (50 kg)  Height: 5\' 5"  (1.651 m)    Body mass index is 18.35 kg/m.  General:  Alert and oriented, no acute distress HEENT: Normal Neck: harsh left carotid bruit no JVD Pulmonary: Clear to auscultation bilaterally, distant breath sounds Cardiac: Regular Rate and Rhythm without murmur, distant heart sounds  Abdomen: Soft, non-tender, non-distended, no mass Skin: No rash, 2 mm ulceration with some surrounding erythema right lateral malleolus mild right ankle edema compared to left Extremity Pulses:  2+ radial, brachial,1+  femoral, absent popliteal dorsalis pedis, posterior tibial pulses bilaterally Musculoskeletal: No deformity   Neurologic: Upper and lower extremity motor 5/5 and symmetric  DATA:  Patient had bilateral ABIs performed previously [facility] imaging November 20. Right side was 0.5 left 0.59. She had a lower extremity arterial duplex exam today which I reviewed and interpreted. 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 Right superficial femoral artery appeared occluded. Left superficial femoral artery had a 75% stenosis. Minimal flow noted in the tibial vessels left leg. Monophasic flow on the right leg.  ASSESSMENT:  Bilateral lower extremity ischemia with claudication and early rest pain and now nonhealing wound right ankle. The patient he is at risk of limb loss. She was counseled today for greater than 3 minutes regarding smoking cessation and a she continues to smoke her risk of limb loss may be significant.  Patient also has a left carotid bruit that when he carotid duplex examination. She will also need this especially if she ends up needing operation.   Right ankle swelling most likely infection continue doxycycline  PLAN:  Aortogram bilateral  lower extremity runoff possible intervention tomorrow. Risks benefits possible complications  and procedure details were discussed the patient today including but not limited to bleeding infection vessel injury. She wishes to proceed.   Ruta Hinds, MD [facility] Office: [phone] Pager: [phone]

## 2016-03-18 NOTE — Op Note (Addendum)
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 The patient was placed supine position the Angio table. Both groins were prepped and draped in usual sterile fashion. Local anesthesia was infiltrated of the left common femoral artery. Ultrasound was used to identify the left common femoral artery. There was some posterior plaque but there was a good window for cannulation. Ultrasound was used for assistance in cannulating the left common femoral artery and an 035 versacore wire threaded up in the left pelvis under fluoroscopic guidance. It would not additionally advance into the aorta. A 5 French sheath was placed over the guidewire and left common femoral artery and this was thoroughly flushed with heparinized saline. A 5 [place] Berenstein catheter was advanced over the guidewire into the proximal left common iliac artery and a contrast angiogram which performed which suggested there might be an aortic occlusion. An 035 angled Glidewire was then brought up in the operative field and I was able to easily advance this up into the descending thoracic aorta. A 5 French Omni Flush catheter was then placed  over this and abdominal aortogram obtained in AP projection. Left and right renal arteries are patent. The infrarenal abdominal aorta is a subtotal occlusion about 2 cm above the iliac bifurcation. The left and right common iliac arteries are patent. There is a 70% stenosis of the mid left common iliac artery. The internal/external iliac arteries are patent but small. There is about a 50% narrowing of the left common femoral artery. At this point bilateral extremity runoff views were obtained.  In the right lower extremity, the right common femoral artery is patent. The right profunda femoris artery is patent. The right superficial femoral artery has a flush occlusion and there is reconstitution of the above-knee popliteal artery via SFA and profunda collaterals. There is three-vessel runoff to the right foot.  In the left lower extremity, the left common femoral artery has a 50% stenosis with some posterior and medial plaque. The profunda is patent. The superficial femoral artery is patent but with multiple segments of calcified subtotal occlusions in the midsegment. The above-knee popliteal artery is patent and there is three-vessel runoff to the left foot.   At this point it was decided to intervene on the subtotal occlusion of the infrarenal abdominal aorta. The 5 French sheath was exchanged out for a 7 French bright tip sheath over the wire after exchanging the Omni Flush catheter over the wire. The patient was given 5000 units of intravenous heparin. After 2 minutes circulation time and confirmed ACT was greater than 200, a 10 x 19 balloon-expandable stent was brought up and centered on the lesion this was then inflated to 11 atm for 1 minute. Completion angiogram showed wide patency of the aorta without dissection.  On this completion Angiogram showed that the 7 French sheath was essentially  occlusive in the left common iliac artery suggesting that this was definitely a high-grade stenosis. The sheath  was pulled back down into the distal left external iliac artery. A 6 x 40 self-expanding stent was brought in operative field and then centered on the left common iliac artery at the area of stenosis. This was then deployed. It was then postdilated with a 6 x 40 balloon to nominal pressure for 45 seconds. Completion arteriogram showed wide patency the aortic stent as well as the left common iliac stent and brisk flow down both iliac systems. At this point the Omni Flush catheter was again removed over the guidewire 7 French sheath was thoroughly flushed with heparin saline. The patient taken the holding area in stable condition for the sheath be pulled in the holding area. The patient tolerated procedure well and there were no complications. The patient was taken to the holding area in stable condition.  Operative management: The patient will be observed for a few weeks to see if this is enough perfusion to heal the wound on her right foot. If not she would need to be considered for a right femoral to above-knee popliteal bypasses endovascular option would not be plausible due to the flush occlusion. If she has continued problems in the left lower extremity, this may be amenable to percutaneous balloon dilation but the vessels are too small to consider stenting.  Ruta Hinds, MD [facility] [place] Office: [phone] Pager: [phone]

## 2016-03-18 NOTE — Progress Notes (Signed)
Pt ambulated with assist approx 200 feet; vitals stable; no problems ambulating, family to drive patient home.

## 2016-03-21 ENCOUNTER — Encounter (HOSPITAL_COMMUNITY): Payer: Self-pay | Admitting: Vascular Surgery

## 2016-03-21 ENCOUNTER — Other Ambulatory Visit: Payer: Self-pay

## 2016-03-21 DIAGNOSIS — I739 Peripheral vascular disease, unspecified: Secondary | ICD-10-CM

## 2016-03-21 MED ORDER — HYDROCHLOROTHIAZIDE 12.5 MG PO CAPS: 12.5000 mg | ORAL_CAPSULE | ORAL | 1 refills | Status: DC

## 2016-03-21 MED ORDER — METOPROLOL TARTRATE 100 MG PO TABS: ORAL_TABLET | ORAL | 1 refills | Status: DC

## 2016-03-21 MED ORDER — ENALAPRIL MALEATE 20 MG PO TABS: 20.0000 mg | ORAL_TABLET | Freq: Every day | ORAL | 1 refills | Status: DC

## 2016-03-21 MED ORDER — LEVOTHYROXINE SODIUM 50 MCG PO TABS: 50.0000 ug | ORAL_TABLET | Freq: Every day | ORAL | 1 refills | Status: DC

## 2016-03-21 MED ORDER — CLOPIDOGREL BISULFATE 75 MG PO TABS: 75.0000 mg | ORAL_TABLET | Freq: Every day | ORAL | 11 refills | Status: DC

## 2016-03-21 NOTE — Telephone Encounter (Signed)
Pt request enalapril,HCTZ, levothyroxine and metoprolol sent to [facility]. Advised done as requested; pt has acct [facility] at [facility]. Spoke with Marya Amsler at [facility] and cancelled refills there on these meds.

## 2016-04-22 ENCOUNTER — Encounter: Payer: Self-pay | Admitting: Vascular Surgery

## 2016-04-27 ENCOUNTER — Encounter (HOSPITAL_COMMUNITY): Payer: PPO

## 2016-04-27 ENCOUNTER — Encounter: Payer: PPO | Admitting: Vascular Surgery

## 2016-05-05 ENCOUNTER — Encounter: Payer: Self-pay | Admitting: Vascular Surgery

## 2016-05-05 ENCOUNTER — Ambulatory Visit (HOSPITAL_COMMUNITY)
Admission: RE | Admit: 2016-05-05 | Discharge: 2016-05-05 | Disposition: A | Discharge status: Home / Self Care | Payer: Medicare Other | Source: Ambulatory Visit | Attending: Vascular Surgery | Admitting: Vascular Surgery

## 2016-05-05 ENCOUNTER — Ambulatory Visit (INDEPENDENT_AMBULATORY_CARE_PROVIDER_SITE_OTHER): Payer: Medicare Other | Admitting: Vascular Surgery

## 2016-05-05 VITALS — BP 148/74 | HR 57 | Temp 97.9°F | Resp 18 | Ht 65.0 in | Wt 115.9 lb

## 2016-05-05 DIAGNOSIS — R9439 Abnormal result of other cardiovascular function study: Secondary | ICD-10-CM | POA: Insufficient documentation

## 2016-05-05 DIAGNOSIS — I739 Peripheral vascular disease, unspecified: Secondary | ICD-10-CM

## 2016-05-05 NOTE — Progress Notes (Signed)
Referring Physician: Dr Damita Dunnings   Patient name: Jill Franco           MRN: [id number]        DOB: [birth year]          Sex: female   REASON FOR CONSULT: Bilateral leg pain with nonhealing ulcer right ankle   HPI: Jill Franco is a 82 y.o. female with a several month history of pain and tightness in her calves when walking about one block. She had also developed an ulceration on the right lateral malleolus. She returns today for follow-up after recent bilateral common iliac stenting. She previously was unable to walk more than a few steps without experiencing calf pain. She states she is now able to walk as far she wants pain-free. She also had some early elements of rest pain in the right foot and this has also resolved. She admits she is still smoking a few cigarettes per day but is trying to quit completely. She states she has gained about 5 pounds with trying to quit smoking. Other medical problems include hypertension hyperlipidemia both of which are currently stable. She is on aspirin and a statin. She denies prior history of stroke or TIA or myocardial infarction. She denies chest pain. She has shortness of breath with less than one flight of stairs.  She is now on aspirin and Plavix and a statin. She states she has no longer experienced pain in the lateral malleolus.       Past Medical History:  Diagnosis Date  . History of shingles 2012  . HLD (hyperlipidemia)    . HTN (hypertension)    . Hypothyroidism           Past Surgical History:  Procedure Laterality Date  . ABDOMINAL EXPLORATION SURGERY      . APPENDECTOMY   childhood  . BREAST LUMPECTOMY   1960's    benign  . carotid ultrasound   06/19/2007    0-39% stenosis  . DILATION AND CURETTAGE OF UTERUS   1960's    miscarriage  . DOPPLER ECHOCARDIOGRAPHY   06/19/2007    Normal EF 55-70%  . LAMINECTOMY AND MICRODISCECTOMY SPINE   1960's  . NSVD        x1           Family History  Problem Relation Age of Onset  .  Stroke Father 72  . Cancer Brother    . Colon cancer Neg Hx    . Breast cancer Neg Hx        SOCIAL HISTORY: Social History         Social History  . Marital status: Widowed      Spouse name: N/A  . Number of children: 1  . Years of education: N/A         Occupational History  . 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 Smoking status: Current Every Day Smoker      Packs/day: 0.33      Years: 58.00      Types: Cigarettes  . Smokeless tobacco: Never Used  . Alcohol use No  . Drug use: No  . Sexual activity: No        Other Topics Concern  . Not on file       Social History Narrative    From [employer]    Retired 2007  Widowed 2008 after 30 years    Enjoys yard work    Her daughter is helping at home some (some as of 2016)           Allergies  Allergen Reactions  . Aspirin        REACTION: Uncoated Stomach upset on empty stomach  . Codeine        REACTION: UNSPECIFIED  . Lipitor [Atorvastatin] Other (See Comments)      myalgia      Current Outpatient Prescriptions on File Prior to Visit  Medication Sig Dispense Refill  . aspirin 81 MG EC tablet Take 81 mg by mouth 2 (two) times daily.     . clopidogrel (PLAVIX) 75 MG tablet Take 1 tablet (75 mg total) by mouth daily. 30 tablet 11  . doxycycline (VIBRA-TABS) 100 MG tablet Take 1 tablet (100 mg total) by mouth 2 (two) times daily. 20 tablet 0  . enalapril (VASOTEC) 20 MG tablet Take 1 tablet (20 mg total) by mouth daily. 90 tablet 1  . hydrochlorothiazide (MICROZIDE) 12.5 MG capsule Take 1 capsule (12.5 mg total) by mouth every morning. 90 capsule 1  . levothyroxine (SYNTHROID, LEVOTHROID) 50 MCG tablet Take 1 tablet (50 mcg total) by mouth daily. 90 tablet 1  . metoprolol (LOPRESSOR) 100 MG tablet 1/2 in the morning and 1 in the evening 135 tablet 1  . pravastatin (PRAVACHOL) 10 MG tablet Take 1 tablet (10 mg total) by mouth daily. 90 tablet 3   No current  facility-administered medications on file prior to visit.       ROS:    Cardiac: No recent episodes of chest pain/pressure, no shortness of breath at rest.  + shortness of breath with exertion.  Denies history of atrial fibrillation or irregular heartbeat   Vascular: + history of rest pain in feet.  + history of claudication.  + history of non-healing ulcer, No history of DVT    Pulmonary: No home oxygen, + productive cough, no hemoptysis,  No asthma or wheezing       Physical Examination    Vitals:   05/05/16 1426 05/05/16 1427  BP: (!) 150/76 (!) 148/74  Pulse: (!) 57   Resp: 18   Temp: 97.9 F (36.6 C)   TempSrc: Oral   SpO2: 99%   Weight: 115 lb 14.4 oz (52.6 kg)   Height: 5\' 5"  (1.651 m)      Skin: No rash, 2 mm ulceration with dry eschar right lateral malleolus, plantar surface of the feet dusky bilaterally and symmetrically suggesting underlying pulmonary or cardiac dysfunction Extremity Pulses:  2+ radial, brachial, femoral, absent popliteal dorsalis pedis, posterior tibial pulses bilaterally Musculoskeletal: No deformity          Neurologic: Upper and lower extremity motor 5/5 and symmetric   DATA:  Patient had bilateral ABIs performed  today which were 0.6 on the right 0.9 on the left, previously [facility] imaging November 20 right side was 0.5 left 0.59.    ASSESSMENT:  Bilateral lower extremity ischemia with claudication and early rest pain and now nonhealing wound right ankle.   all symptoms have now resolved with the exception of the small ulcer on her right lateral malleolus. The patient thinks this is currently healing.  Patient also has a left carotid bruit and will need a carotid duplex exam     PLAN:    patient will follow-up with me in 6 weeks. If the wound on her ankle has not completely healed  she'll need to be considered for right femoropopliteal bypass grafting and she also has bilateral superficial femoral artery occlusions that were not amenable  to percutaneous intervention. She has 3 vessel runoff on both feet and require right femoral to above-knee popliteal bypass if her wound does not heal. Otherwise she is satisfied with her walking distance and I do not think any other intervention would be necessary  Patient will need to be scheduled for carotid duplex exam in the future   Ruta Hinds, MD [facility] [place]: [phone] Pager: [phone]

## 2016-06-15 ENCOUNTER — Encounter: Payer: Self-pay | Admitting: Vascular Surgery

## 2016-06-17 ENCOUNTER — Encounter: Payer: Self-pay | Admitting: Family Medicine

## 2016-06-17 ENCOUNTER — Ambulatory Visit (INDEPENDENT_AMBULATORY_CARE_PROVIDER_SITE_OTHER): Payer: Medicare Other | Admitting: Family Medicine

## 2016-06-17 DIAGNOSIS — F17200 Nicotine dependence, unspecified, uncomplicated: Secondary | ICD-10-CM | POA: Diagnosis not present

## 2016-06-17 DIAGNOSIS — B029 Zoster without complications: Secondary | ICD-10-CM | POA: Diagnosis not present

## 2016-06-17 DIAGNOSIS — IMO0001 Reserved for inherently not codable concepts without codable children: Secondary | ICD-10-CM

## 2016-06-17 MED ORDER — NICOTINE 7 MG/24HR TD PT24: 7.0000 mg | MEDICATED_PATCH | Freq: Every day | TRANSDERMAL | 1 refills | Status: DC

## 2016-06-17 MED ORDER — VALACYCLOVIR HCL 1 G PO TABS: 1000.0000 mg | ORAL_TABLET | Freq: Three times a day (TID) | ORAL | 0 refills | Status: DC

## 2016-06-17 NOTE — Progress Notes (Signed)
She was asking about the new shingles shot.  D/w pt.  See AVS.   Red spots noted on the R lower leg.  Burning and itching.  Started a few days ago.  No L leg sx.    She has cut back on cigarettes, down to a few cigs a day.  D/w pt.  She has tried gum replacement.  Her prev leg pain is better since cutting back on smoking.    Meds, vitals, and allergies reviewed.   ROS: Per HPI unless specifically indicated in ROS section   nad R lower leg with likely shingles rash on the posterior side, dermatomal maculopapular lesions w/o ulceration.  She has some anterior irritation that seems be from a different issue, likely from bumping the leg prev. No edema in the BLE.  No lower ext ulceration.

## 2016-06-17 NOTE — Assessment & Plan Note (Signed)
Reasonable to try 7mg  nicotine patch and update [place] as needed.  She agrees.

## 2016-06-17 NOTE — Progress Notes (Signed)
Pre visit review using our clinic review tool, if applicable. No additional management support is needed unless otherwise documented below in the visit note. 

## 2016-06-17 NOTE — Patient Instructions (Addendum)
Check with your insurance to see if they will cover the shingles shot when you feel better.   Presumed shingles.  Start valtrex.  Check on the 7mg  nicotine patch.  Take care.  Glad to see you.  Update me as needed.

## 2016-06-17 NOTE — Assessment & Plan Note (Signed)
Likely, d/w pt.  See AVS.  Start valtrex and update [place] as needed.  It may be the case that she has milder sx with prev vaccination noted.  She agrees.

## 2016-06-22 ENCOUNTER — Telehealth: Payer: Self-pay

## 2016-06-22 NOTE — Telephone Encounter (Signed)
error 

## 2016-06-22 NOTE — Telephone Encounter (Signed)
PLEASE NOTE: All timestamps contained within this report are represented as [place] Standard Time. CONFIDENTIALTY NOTICE: This fax transmission is intended only for the addressee. It contains information that is legally privileged, confidential or otherwise protected from use or disclosure. If you are not the intended recipient, you are strictly prohibited from reviewing, disclosing, copying using or disseminating any of this information or taking any action in reliance on or regarding this information. If you have received this fax in error, please notify us immediately by telephone so that we can arrange for its return to [place]. Phone: [phone], Toll-Free: [phone], Fax: [phone] Page: 1 of 2 Call Id: [id number] [place] Patient Name: Jill Franco Gender: Female DOB: 05-Jul-1934 Age: 82 Y 1 M 2 D Return Phone Number: [phone] (Primary) City/State/Zip: [place] Client [place] [facility] [place] - Client Client Site [place] Physician Renford Dills - MD Who Is Calling Patient / Member / Family / Caregiver Call Type Triage / Clinical Relationship To Patient Self Return Phone Number [phone] (Primary) Chief Complaint Blood Pressure High Reason for Call Symptomatic / Request for [place] states she is on BP medication and she got blurred vision and she got real dizzy today. Caller states she feels fine now but is wondering if she should take her BP medication today that she usually takes at 5 113/55 Nurse Assessment Nurse: Jimmey Ralph, RN, Epifanio Lesches Date/Time (Eastern Time): 06/22/2016 4:27:06 PM Confirm and document reason for call. If symptomatic, describe symptoms. ---Caller states she is on BP medication and she got blurred vision and she got real dizzy today. Caller states she feels fine now but is wondering  if she should take her BP medication today that she usually takes at 5. My BP is 113/55 and waiting and retook 113/58. Does the PT have any chronic conditions? (i.e. diabetes, asthma, etc.) ---Yes List chronic conditions. ---High blood pressure poor blood flow in legs Guidelines Guideline Title Affirmed Question Low Blood Pressure [5] Systolic BP 85-277 AND [8] taking blood pressure medications AND [3] dizzy, lightheaded or weak Disp. Time Eilene Ghazi Time) Disposition Final User 06/22/2016 4:34:35 PM See Physician within 4 Hours (or PCP triage) Yes Hammonds, RN, Lissa Referrals REFERRED TO PCP OFFICE Care Advice Given Per Guideline CARE ADVICE given per Low Blood Pressure (Adult) guideline. SEE PHYSICIAN WITHIN 4 HOURS (or PCP triage): CALL BACK IF: * You become worse. Comments User: Moses Manners, RN Date/Time Eilene Ghazi Time): 06/22/2016 4:29:28 PM PLEASE NOTE: All timestamps contained within this report are represented as [place] Standard Time. CONFIDENTIALTY NOTICE: This fax transmission is intended only for the addressee. It contains information that is legally privileged, confidential or otherwise protected from use or disclosure. If you are not the intended recipient, you are strictly prohibited from reviewing, disclosing, copying using or disseminating any of this information or taking any action in reliance on or regarding this information. If you have received this fax in error, please notify us immediately by telephone so that we can arrange for its return to [place]. Phone: [phone], Toll-Free: [phone], Fax: [phone] Page: 2 of 2 Call Id: [id number] Comments I am on plavix too and thyroid pill. User: Moses Manners, RN Date/Time Eilene Ghazi Time): 06/22/2016 4:34:30 PM Caller will contact office tomorrow to schedule an appointment.

## 2016-06-22 NOTE — Telephone Encounter (Signed)
I spoke with pt and she took her BP few mins ago and was 110/59; no dizziness or blurred vision now; pt scheduled 30 ' appt with Dr Damita Dunnings on 06/23/16. If pt worsens tonight will go to ED. FYI to Dr Damita Dunnings.

## 2016-06-22 NOTE — Telephone Encounter (Deleted)
PLEASE NOTE: All timestamps contained within this report are represented as [place] Standard Time. CONFIDENTIALTY NOTICE: This fax transmission is intended only for the addressee. It contains information that is legally privileged, confidential or otherwise protected from use or disclosure. If you are not the intended recipient, you are strictly prohibited from reviewing, disclosing, copying using or disseminating any of this information or taking any action in reliance on or regarding this information. If you have received this fax in error, please notify us immediately by telephone so that we can arrange for its return to [place]. Phone: [phone], Toll-Free: [phone], Fax: [phone] Page: 1 of 1 Call Id: [id number] [facility] - Client Nonclinical Telephone Record [facility] [facility] - Client Client Site [place] Physician Ria Bush - MD Contact Type Call Who Is Calling Patient / Member / Family / Caregiver Caller Name Taylors Phone Number [phone] Patient Name Jill Franco Call Type Message Only Information Provided Reason for Call Request for General Office Information Initial Comment Caller states husband had a prostate exam, she is needing to schedule an appt for him for today, or she is wanting to take him on to the hospital. Symptoms: exam, and not been the same since. Additional Comment Provided information for the office, and connected caller to the office for further assistance. Call Closed By: Rosana Fret Transaction Date/Time: 06/22/2016 8:42:04 AM (ET)

## 2016-06-23 ENCOUNTER — Ambulatory Visit (INDEPENDENT_AMBULATORY_CARE_PROVIDER_SITE_OTHER): Payer: Medicare Other | Admitting: Family Medicine

## 2016-06-23 ENCOUNTER — Ambulatory Visit (INDEPENDENT_AMBULATORY_CARE_PROVIDER_SITE_OTHER): Payer: Medicare Other | Admitting: Vascular Surgery

## 2016-06-23 ENCOUNTER — Encounter: Payer: Self-pay | Admitting: Family Medicine

## 2016-06-23 ENCOUNTER — Encounter: Payer: Self-pay | Admitting: Vascular Surgery

## 2016-06-23 ENCOUNTER — Other Ambulatory Visit: Payer: Self-pay | Admitting: *Deleted

## 2016-06-23 VITALS — BP 152/80 | HR 70 | Temp 98.5°F | Wt 115.5 lb

## 2016-06-23 VITALS — BP 143/77 | HR 62 | Temp 98.2°F | Ht 65.0 in | Wt 115.0 lb

## 2016-06-23 DIAGNOSIS — I739 Peripheral vascular disease, unspecified: Secondary | ICD-10-CM | POA: Diagnosis not present

## 2016-06-23 DIAGNOSIS — R0989 Other specified symptoms and signs involving the circulatory and respiratory systems: Secondary | ICD-10-CM

## 2016-06-23 DIAGNOSIS — I10 Essential (primary) hypertension: Secondary | ICD-10-CM | POA: Diagnosis not present

## 2016-06-23 LAB — BASIC METABOLIC PANEL
BUN: 17 mg/dL (ref 6–23)
CO2: 30 meq/L (ref 19–32)
Calcium: 9.5 mg/dL (ref 8.4–10.5)
Chloride: 100 mEq/L (ref 96–112)
Creatinine, Ser: 0.74 mg/dL (ref 0.40–1.20)
GFR: 80.24 mL/min (ref >60.00)
GLUCOSE: 93 mg/dL (ref 70–99)
POTASSIUM: 5 meq/L (ref 3.5–5.1)
SODIUM: 134 meq/L — AB (ref 135–145)

## 2016-06-23 MED ORDER — HYDROCHLOROTHIAZIDE 12.5 MG PO CAPS: 12.5000 mg | ORAL_CAPSULE | Freq: Every day | ORAL | Status: DC | PRN

## 2016-06-23 MED ORDER — ENALAPRIL MALEATE 20 MG PO TABS: 10.0000 mg | ORAL_TABLET | Freq: Every day | ORAL | Status: DC

## 2016-06-23 NOTE — Patient Instructions (Signed)
Go to the lab on the way out.  We'll contact you with your lab report. Keep taking metoprolol as is.  Cut the enalapril back to 1/2 tab a day starting tomorrow.  Take hydrochlorothiazide daily if you have swelling or BP >140/>90.  Update me in a few days.  Take care.  Glad to see you.

## 2016-06-23 NOTE — Progress Notes (Signed)
She has had some R sided neck pain and HA, occasional, fluctuating. Her headache was improved today.  No neck pain now  She saw Dr. Oneida Alar this AM and has planned carotid u/s per patient report.    She was "dizzy" yesterday.  Worse standing, but better sitting.  idn't recall sx when laying down.  Didn't happen last night laying down.  Didn't happen with rolling over in bed.   She had lower BP noted at home.  Initially 103/59 at home.  She took a dose of metoprolol today, skipped HCTZ and ACE dose, and her BP is some better today.  No sx in the meantime.    No focal neuro changes o/w.    Likely shingles better on her legs.  Not painful now.  Still on valtrex.    Meds, vitals, and allergies reviewed.   ROS: Per HPI unless specifically indicated in ROS section   GEN: nad, alert and oriented HEENT: mucous membranes moist NECK: supple w/o LA CV: rrr.  PULM: ctab, no inc wob ABD: soft, +bs EXT: no edema Shingles rash on lower right leg is improved compared to previous.

## 2016-06-23 NOTE — Progress Notes (Signed)
Pre visit review using our clinic review tool, if applicable. No additional management support is needed unless otherwise documented below in the visit note. 

## 2016-06-23 NOTE — Progress Notes (Signed)
Patient is an 82 year old female who underwent left common iliac and aortic stenting 03/18/2016. This was done for claudication symptoms as well as a small ulceration on the right lateral malleolus. She reports her claudication symptoms have completely resolved. The ulcer on her right leg is also healed. She has known bilateral superficial femoral artery occlusions but currently does not really have symptoms from these. She is able to walk as much as she wishes without problems in her legs at this point. She denies rest pain. She is on aspirin and Plavix.  Unfortunately she continues to smoke.  Current Outpatient Prescriptions on File Prior to Visit  Medication Sig Dispense Refill  . aspirin 81 MG EC tablet Take 81 mg by mouth 2 (two) times daily.     . clopidogrel (PLAVIX) 75 MG tablet Take 1 tablet (75 mg total) by mouth daily. 30 tablet 11  . levothyroxine (SYNTHROID, LEVOTHROID) 50 MCG tablet Take 1 tablet (50 mcg total) by mouth daily. 90 tablet 1  . metoprolol (LOPRESSOR) 100 MG tablet 1/2 in the morning and 1 in the evening 135 tablet 1  . nicotine (NICODERM CQ) 7 mg/24hr patch Place 1 patch (7 mg total) onto the skin daily. 28 patch 1  . valACYclovir (VALTREX) 1000 MG tablet Take 1 tablet (1,000 mg total) by mouth 3 (three) times daily. 21 tablet 0   No current facility-administered medications on file prior to visit.     Review of systems: She denies shortness of breath. She denies chest pain.   Physical exam:  Vitals:   06/23/16 0954  BP: (!) 143/77  Pulse: 62  Temp: 98.2 F (36.8 C)  TempSrc: Oral  SpO2: 99%  Weight: 115 lb (52.2 kg)  Height: 5\' 5"  (1.651 m)    Neck: Bilateral carotid bruits or chest: Clear to auscultation bilaterally  Cardiac: Regular rate and rhythm  Extremities: No palpable popliteal or pedal pulses but she does have 2+ femoral pulses  Data: ABIs from March 15 were 0.86 on the left 0.5 on the right  Assessment: Peripheral arterial disease  currently asymptomatic.  Plan: The patient will follow-up in 6 months time with our nurse practitioners with repeat ABIs. She also needs a carotid duplex at that time for evaluation of carotid bruit. She currently has no neurologic symptoms.  Ruta Hinds, MD [facility] Office: [phone] Pager: [phone]

## 2016-06-24 ENCOUNTER — Encounter: Payer: Self-pay | Admitting: *Deleted

## 2016-06-24 NOTE — Assessment & Plan Note (Signed)
Likely overtreated. Change hydrochlorothiazide to QD PRN edema or elevated BP. Cut enalapril back to half tablet a day, now 10 milligrams per day. Continue metoprolol as is. Update me as needed. See notes on labs.  She agrees.

## 2016-07-06 ENCOUNTER — Telehealth: Payer: Self-pay

## 2016-07-06 NOTE — Telephone Encounter (Signed)
Please clarify.  If she means lightheaded, then I would stop the enalapril and update me in about 1 week, sooner if needed.  If she means room spinning (or both lightheaded and room spinning), then let me know.   Thanks.

## 2016-07-06 NOTE — Telephone Encounter (Signed)
Noted. Thanks.

## 2016-07-06 NOTE — Telephone Encounter (Signed)
Patient notified as instructed by telephone and verbalized understanding. Patient stated that she is lightheaded at times when she is up moving around and the room is not spinning. Patient stated that she will update Dr. Damita Dunnings in a week or sooner if needed.

## 2016-07-06 NOTE — Telephone Encounter (Signed)
Pt has been having dizziness on and off; pt has been taking meclizine which has helped dizziness some but dizziness has not gone away. No H/A.last week BP was from 151/89 to 108/66. 07/01/16 BP 111/60. Today BP early this morning 135/74; pt got dizzy and pt took BP and it was 94/59. After resting BP 119/62 and at 1 PM today BP 114/67. Pt taking enalapril 20 mg tab taking 1/2 tab daily. Metoprolol 100 mg taking 1/2 tab in AM and 1 tab in evening. Pt has been taking the HCTZ 12.5 mg one daily due to feet swelling. Pt request cb with what Dr Damita Dunnings wants pt to do now. [facility].

## 2016-07-10 HISTORY — PX: MOLE REMOVAL: SHX2046

## 2016-07-13 ENCOUNTER — Other Ambulatory Visit: Payer: Self-pay | Admitting: Family Medicine

## 2016-07-14 ENCOUNTER — Other Ambulatory Visit: Payer: Self-pay | Admitting: *Deleted

## 2016-07-14 ENCOUNTER — Telehealth: Payer: Self-pay

## 2016-07-14 DIAGNOSIS — I739 Peripheral vascular disease, unspecified: Secondary | ICD-10-CM

## 2016-07-14 MED ORDER — CLOPIDOGREL BISULFATE 75 MG PO TABS: 75.0000 mg | ORAL_TABLET | Freq: Every day | ORAL | 3 refills | Status: DC

## 2016-07-14 NOTE — Telephone Encounter (Signed)
Sent. Thanks.   

## 2016-07-14 NOTE — Telephone Encounter (Signed)
Patient advised.   Patient will use compression stockings.

## 2016-07-14 NOTE — Telephone Encounter (Signed)
Pt calling back about BP readings since stopped Enalapril. 07/13/16 BP 151/94 and no reason for elevated BP.Today BP reading was 142/78; usually 110's /60-70. Pt not having any h/a, dizziness, CP or any other symptoms except feet swelling. Pt also wants to know if Dr Damita Dunnings wants pt to continue taking plavix; Dr Oneida Alar started pt on med. Pt request med to help stop feet from swelling. Pt puts feet up when sitting; swelling in feet does go down somewhat during the night; but the swelling never completely goes away. [facility].

## 2016-07-14 NOTE — Telephone Encounter (Signed)
Would continue plavix.  Can she use compression stocking for the edema?  Can she tolerate that?  That would likely be more effective than another fluid pill that may drop her BP too much.  Thanks.

## 2016-07-22 DIAGNOSIS — H2513 Age-related nuclear cataract, bilateral: Secondary | ICD-10-CM | POA: Diagnosis not present

## 2016-07-28 DIAGNOSIS — H532 Diplopia: Secondary | ICD-10-CM | POA: Diagnosis not present

## 2016-08-04 ENCOUNTER — Ambulatory Visit (INDEPENDENT_AMBULATORY_CARE_PROVIDER_SITE_OTHER): Payer: Medicare Other

## 2016-08-04 ENCOUNTER — Other Ambulatory Visit: Payer: Self-pay | Admitting: Family Medicine

## 2016-08-04 VITALS — BP 120/82 | HR 52 | Temp 98.0°F | Ht 64.0 in | Wt 114.5 lb

## 2016-08-04 DIAGNOSIS — Z0000 Encounter for general adult medical examination without abnormal findings: Secondary | ICD-10-CM | POA: Diagnosis not present

## 2016-08-04 DIAGNOSIS — E785 Hyperlipidemia, unspecified: Secondary | ICD-10-CM

## 2016-08-04 DIAGNOSIS — E039 Hypothyroidism, unspecified: Secondary | ICD-10-CM

## 2016-08-04 LAB — HEPATIC FUNCTION PANEL
ALBUMIN: 4 g/dL (ref 3.5–5.2)
ALT: 13 U/L (ref 0–35)
AST: 19 U/L (ref 0–37)
Alkaline Phosphatase: 87 U/L (ref 39–117)
Bilirubin, Direct: 0 mg/dL (ref 0.0–0.3)
Total Bilirubin: 0.3 mg/dL (ref 0.2–1.2)
Total Protein: 7 g/dL (ref 6.0–8.3)

## 2016-08-04 LAB — TSH: TSH: 2.75 u[IU]/mL (ref 0.35–4.50)

## 2016-08-04 LAB — LIPID PANEL
CHOLESTEROL: 196 mg/dL (ref 0–200)
HDL: 71 mg/dL (ref >39.00)
LDL Cholesterol: 108 mg/dL — ABNORMAL HIGH (ref 0–99)
NONHDL: 124.58
Total CHOL/HDL Ratio: 3
Triglycerides: 81 mg/dL (ref 0.0–149.0)
VLDL: 16.2 mg/dL (ref 0.0–40.0)

## 2016-08-04 NOTE — Progress Notes (Signed)
Pre visit review using our clinic review tool, if applicable. No additional management support is needed unless otherwise documented below in the visit note. 

## 2016-08-04 NOTE — Progress Notes (Signed)
Subjective:   Jill Franco is a 82 y.o. female who presents for Medicare Annual (Subsequent) preventive examination.  Review of Systems:  N/A Cardiac Risk Factors include: advanced age (>48men, >1 women);smoking/ tobacco exposure;dyslipidemia;hypertension     Objective:     Vitals: BP 120/82 (BP Location: Right Arm, Patient Position: Sitting, Cuff Size: Normal)   Pulse (!) 52   Temp 98 F (36.7 C) (Oral)   Ht 5\' 4"  (1.626 m) Comment: no shoes  Wt 114 lb 8 oz (51.9 kg)   SpO2 98%   BMI 19.65 kg/m   Body mass index is 19.65 kg/m.   Tobacco History  Smoking Status  . Current Every Day Smoker  . Packs/day: 0.33  . Years: 62.00  . Types: Cigarettes  Smokeless Tobacco  . Never Used    Comment: 4-5 cigarettes per day     Ready to quit: Yes Counseling given: No   Past Medical History:  Diagnosis Date  . History of shingles 2012  . HLD (hyperlipidemia)   . HTN (hypertension)   . Hypothyroidism   . PVD (peripheral vascular disease) ([place])    Past Surgical History:  Procedure Laterality Date  . ABDOMINAL EXPLORATION SURGERY  1950s   "they thought I was pregnant; went in to explore"  . APPENDECTOMY  childhood  . BACK SURGERY    . BREAST LUMPECTOMY Left 1960's   benign  . carotid ultrasound  06/19/2007   0-39% stenosis  . DILATION AND CURETTAGE OF UTERUS  1960's X 3   miscarriages  . DOPPLER ECHOCARDIOGRAPHY  06/19/2007   Normal EF 55-70%  . ILIAC ARTERY STENT Left 03/18/2016   common iliac stent (6 x 40), aortic stent (10 x 19)/notes 03/18/2016  . LAMINECTOMY AND MICRODISCECTOMY SPINE  1960's X 2  . NSVD     x1  . PERIPHERAL VASCULAR CATHETERIZATION N/A 03/18/2016   Procedure: Abdominal Aortogram w/Lower Extremity;  Surgeon: Elam Dutch, MD;  Location: [facility];  Service: Cardiovascular;  Laterality: N/A;  . PERIPHERAL VASCULAR CATHETERIZATION  03/18/2016   Procedure: Peripheral Vascular Intervention;  Surgeon: Elam Dutch, MD;  Location:  [facility];  Service: Cardiovascular;;   Family History  Problem Relation Age of Onset  . Stroke Father 21  . Cancer Brother   . Colon cancer Neg Hx   . Breast cancer Neg Hx    History  Sexual Activity  . Sexual activity: No    Outpatient Encounter Prescriptions as of 08/04/2016  Medication Sig  . aspirin 81 MG EC tablet Take 81 mg by mouth 2 (two) times daily.   . clopidogrel (PLAVIX) 75 MG tablet Take 1 tablet (75 mg total) by mouth daily.  . hydrochlorothiazide (MICROZIDE) 12.5 MG capsule TAKE 1 CAPSULE BY MOUTH  EVERY MORNING  . levothyroxine (SYNTHROID, LEVOTHROID) 50 MCG tablet TAKE 1 TABLET BY MOUTH  DAILY  . metoprolol tartrate (LOPRESSOR) 100 MG tablet TAKE 1/2 TABLET BY MOUTH IN THE MORNING AND 1 TABLET IN THE EVENING  . nicotine (NICODERM CQ) 7 mg/24hr patch Place 1 patch (7 mg total) onto the skin daily.  . [DISCONTINUED] valACYclovir (VALTREX) 1000 MG tablet Take 1 tablet (1,000 mg total) by mouth 3 (three) times daily.   No facility-administered encounter medications on file as of 08/04/2016.     Activities of Daily Living In your present state of health, do you have any difficulty performing the following activities: 08/04/2016 03/18/2016  Hearing? N -  Vision? Y -  Difficulty concentrating or making decisions? N -  Walking or climbing stairs? N -  Dressing or bathing? N -  Doing errands, shopping? N Y  Conservation officer, nature and eating ? N -  Using the Toilet? N -  In the past six months, have you accidently leaked urine? N -  Do you have problems with loss of bowel control? N -  Managing your Medications? N -  Managing your Finances? N -  Some recent data might be hidden    Patient Care Team: Tonia Ghent, MD as PCP - General (Family Medicine) Marica Otter, OD as Referring Physician (Optometry)    Assessment:     Hearing Screening   125Hz  250Hz  500Hz  1000Hz  2000Hz  3000Hz  4000Hz  6000Hz  8000Hz   Right ear:   40 40 40  0    Left ear:   40 40 40  0      Vision Screening Comments: Last vision exam on 07/29/16   Exercise Activities and Dietary recommendations Current Exercise Habits: Home exercise routine, Type of exercise: walking, Time (Minutes): 45, Frequency (Times/Week): 7, Weekly Exercise (Minutes/Week): 315, Intensity: Mild, Exercise limited by: None identified  Goals    . Increase physical activity          Starting 08/04/2016, I will continue to walk at least 30-45 min daily.       Fall Risk Fall Risk  08/04/2016 07/27/2015 08/04/2014 06/21/2013 06/19/2012  Falls in the past year? No No No No No   Depression Screen PHQ 2/9 Scores 08/04/2016 07/27/2015 08/04/2014 06/21/2013  PHQ - 2 Score 0 0 0 0     Cognitive Function MMSE - Mini Mental State Exam 08/04/2016 07/27/2015  Orientation to time 5 5  Orientation to Place 5 5  Registration 3 3  Attention/ Calculation 0 0  Recall 3 3  Language- name 2 objects 0 0  Language- repeat 1 1  Language- follow 3 step command 3 3  Language- read & follow direction 0 0  Write a sentence 0 0  Copy design 0 0  Total score 20 20       PLEASE NOTE: A Mini-Cog screen was completed. Maximum score is 20. A value of 0 denotes this part of [place] MMSE was not completed or the patient failed this part of the Mini-Cog screening.   Mini-Cog Screening Orientation to Time - Max 5 pts Orientation to Place - Max 5 pts Registration - Max 3 pts Recall - Max 3 pts Language Repeat - Max 1 pts Language Follow 3 Step Command - Max 3 pts   Immunization History  Administered Date(s) Administered  . Influenza Split 12/06/2010  . Influenza Whole 11/21/2004, 11/30/2006, 12/02/2008, 11/27/2009  . Influenza,inj,Quad PF,36+ Mos 12/11/2012, 11/12/2013, 01/29/2015, 11/27/2015  . Pneumococcal Conjugate-13 08/04/2014  . Pneumococcal Polysaccharide-23 05/23/2006  . Td 05/22/2004  . Tdap 05/31/2010  . Zoster 06/11/2010   Screening Tests Health Maintenance  Topic Date Due  . DEXA SCAN  07/27/2023 (Originally  05/21/2001)  . INFLUENZA VACCINE  09/21/2016  . TETANUS/TDAP  05/30/2020  . PNA vac Low Risk Adult  Completed      Plan:     I have personally reviewed and addressed the Medicare Annual Wellness questionnaire and have noted the following in the patient's chart:  A. Medical and social history B. Use of alcohol, tobacco or illicit drugs  C. Current medications and supplements D. Functional ability and status E.  Nutritional status F.  Physical activity G. Advance directives H. List of  other physicians I.  Hospitalizations, surgeries, and ER visits in previous 12 months J.  [place] to include hearing, vision, cognitive, depression L. Referrals and appointments - none  In addition, I have reviewed and discussed with patient certain preventive protocols, quality metrics, and best practice recommendations. A written personalized care plan for preventive services as well as general preventive health recommendations were provided to patient.  See attached scanned questionnaire for additional information.   Signed,   Lindell Noe, MHA, BS, LPN Health Coach

## 2016-08-04 NOTE — Progress Notes (Signed)
PCP notes:   Health maintenance:  No gaps identified.  Abnormal screenings:   Hearing - failed  Patient concerns:   Double vision - recent onset approx. 10 days ago. Consulted with eye professional. Advised she has cataracts.   Swollen feet - pt reports concerns with swollen feet. Feet are visibly red and swollen. Pt has been using circulation stockings to reduce swelling. Pt states this has been minimally effective. Pt encouraged to continue elevating feet.   Nurse concerns:  None  Next PCP appt:   08/15/16 @ 0845  I reviewed health advisor's note, was available for consultation on the day of service listed in this note, and agree with documentation and plan. Donetta Stain, MD.

## 2016-08-04 NOTE — Patient Instructions (Signed)
Ms. Mohiuddin , Thank you for taking time to come for your Medicare Wellness Visit. I appreciate your ongoing commitment to your health goals. Please review the following plan we discussed and let me know if I can assist you in the future.   These are the goals we discussed: Goals    . Increase physical activity          Starting 08/04/2016, I will continue to walk at least 30-45 min daily.        This is a list of the screening recommended for you and due dates:  Health Maintenance  Topic Date Due  . DEXA scan (bone density measurement)  07/27/2023*  . Flu Shot  09/21/2016  . Tetanus Vaccine  05/30/2020  . Pneumonia vaccines  Completed  *Topic was postponed. The date shown is not the original due date.   Preventive Care for Adults  A healthy lifestyle and preventive care can promote health and wellness. Preventive health guidelines for adults include the following key practices.  . A routine yearly physical is a good way to check with your health care provider about your health and preventive screening. It is a chance to share any concerns and updates on your health and to receive a thorough exam.  . Visit your dentist for a routine exam and preventive care every 6 months. Brush your teeth twice a day and floss once a day. Good oral hygiene prevents tooth decay and gum disease.  . The frequency of eye exams is based on your age, health, family medical history, use  of contact lenses, and other factors. Follow your health care provider's ecommendations for frequency of eye exams.  . Eat a healthy diet. Foods like vegetables, fruits, whole grains, low-fat dairy products, and lean protein foods contain the nutrients you need without too many calories. Decrease your intake of foods high in solid fats, added sugars, and salt. Eat the right amount of calories for you. Get information about a proper diet from your health care provider, if necessary.  . Regular physical exercise is one of  the most important things you can do for your health. Most adults should get at least 150 minutes of moderate-intensity exercise (any activity that increases your heart rate and causes you to sweat) each week. In addition, most adults need muscle-strengthening exercises on 2 or more days a week.  Silver Sneakers may be a benefit available to you. To determine eligibility, you may visit the website: www.silversneakers.com or contact program at 9311613318 Mon-Fri between 8AM-8PM.   . Maintain a healthy weight. The body mass index (BMI) is a screening tool to identify possible weight problems. It provides an estimate of body fat based on height and weight. Your health care provider can find your BMI and can help you achieve or maintain a healthy weight.   For adults 20 years and older: ? A BMI below 18.5 is considered underweight. ? A BMI of 18.5 to 24.9 is normal. ? A BMI of 25 to 29.9 is considered overweight. ? A BMI of 30 and above is considered obese.   . Maintain normal blood lipids and cholesterol levels by exercising and minimizing your intake of saturated fat. Eat a balanced diet with plenty of fruit and vegetables. Blood tests for lipids and cholesterol should begin at age 55 and be repeated every 5 years. If your lipid or cholesterol levels are high, you are over 50, or you are at high risk for heart disease, you may need  your cholesterol levels checked more frequently. Ongoing high lipid and cholesterol levels should be treated with medicines if diet and exercise are not working.  . If you smoke, find out from your health care provider how to quit. If you do not use tobacco, please do not start.  . If you choose to drink alcohol, please do not consume more than 2 drinks per day. One drink is considered to be 12 ounces (355 mL) of beer, 5 ounces (148 mL) of wine, or 1.5 ounces (44 mL) of liquor.  . If you are 77-61 years old, ask your health care provider if you should take aspirin to  prevent strokes.  . Use sunscreen. Apply sunscreen liberally and repeatedly throughout the day. You should seek shade when your shadow is shorter than you. Protect yourself by wearing long sleeves, pants, a wide-brimmed hat, and sunglasses year round, whenever you are outdoors.  . Once a month, do a whole body skin exam, using a mirror to look at the skin on your back. Tell your health care provider of new moles, moles that have irregular borders, moles that are larger than a pencil eraser, or moles that have changed in shape or color.

## 2016-08-08 ENCOUNTER — Ambulatory Visit: Payer: Medicare Other

## 2016-08-15 ENCOUNTER — Encounter: Payer: Self-pay | Admitting: Family Medicine

## 2016-08-15 ENCOUNTER — Ambulatory Visit (INDEPENDENT_AMBULATORY_CARE_PROVIDER_SITE_OTHER): Payer: Medicare Other | Admitting: Family Medicine

## 2016-08-15 VITALS — BP 110/68 | HR 65 | Temp 97.9°F | Ht 64.0 in | Wt 114.0 lb

## 2016-08-15 DIAGNOSIS — I10 Essential (primary) hypertension: Secondary | ICD-10-CM

## 2016-08-15 DIAGNOSIS — E039 Hypothyroidism, unspecified: Secondary | ICD-10-CM

## 2016-08-15 DIAGNOSIS — I739 Peripheral vascular disease, unspecified: Secondary | ICD-10-CM

## 2016-08-15 DIAGNOSIS — H532 Diplopia: Secondary | ICD-10-CM

## 2016-08-15 DIAGNOSIS — E785 Hyperlipidemia, unspecified: Secondary | ICD-10-CM

## 2016-08-15 DIAGNOSIS — Z0000 Encounter for general adult medical examination without abnormal findings: Secondary | ICD-10-CM

## 2016-08-15 MED ORDER — HYDROCHLOROTHIAZIDE 12.5 MG PO CAPS: 12.5000 mg | ORAL_CAPSULE | Freq: Every morning | ORAL | 3 refills | Status: DC

## 2016-08-15 MED ORDER — LEVOTHYROXINE SODIUM 50 MCG PO TABS
50.0000 ug | ORAL_TABLET | Freq: Every day | ORAL | 3 refills | Status: DC
Start: 2016-08-15 — End: 2017-07-11

## 2016-08-15 MED ORDER — CLOPIDOGREL BISULFATE 75 MG PO TABS: 75.0000 mg | ORAL_TABLET | Freq: Every day | ORAL | 3 refills | Status: DC

## 2016-08-15 MED ORDER — METOPROLOL TARTRATE 100 MG PO TABS: ORAL_TABLET | ORAL | 3 refills | Status: DC

## 2016-08-15 MED ORDER — NICOTINE 7 MG/24HR TD PT24: 7.0000 mg | MEDICATED_PATCH | Freq: Every day | TRANSDERMAL | 1 refills | Status: DC

## 2016-08-15 NOTE — Progress Notes (Signed)
Hearing - failed.  D/w pt.  Declined hearing aids. Mammogram declined.  D/w pt.   DXA declined.  D/w pt.   Living will d/w pt.  Daughter Thayer Headings designated if patient were incapacitated.  Colonoscopy declined.    Double vision laterally for 1 week prev but resolved now.  She went for eye exam. Advised she has cataracts, she is considering removal.  The event was about 1 month ago.  No other neuro sx in the meantime.  D/w pt to notify MD immediately if any new neuro sx.  No h/o palpitations.   PVD noted.  Pt reports concerns with swollen feet. Pt has been using circulation stockings to reduce swelling, with some relief while on.  Elevation helps but sx return in the AM each day.  Zero cramping in her legs now.    She is cutting back on tobacco.  She is smoking a few cigs a day, not while using the patches.  D/w pt, encouraged to quit.    Hypertension:    Using medication without problems or lightheadedness: yes Chest pain with exertion:no Edema:at baseline, see above Short of breath:no Labs d/w pt.   Statin intolerant.  Lipids improved.    Hypothyroidism.  No ADE on med.  No neck mass. TSH wnl.  Compliant with med.   Meds, vitals, and allergies reviewed.   PMH and SH reviewed  ROS: Per HPI unless specifically indicated in ROS section   GEN: nad, alert and oriented HEENT: mucous membranes moist NECK: supple w/o LA CV: rrr. PULM: ctab, no inc wob ABD: soft, +bs EXT: trace edema with normal cap refill but no DP or PT pulses noted- this is at baseline.  SKIN: no acute rash but 3x4 mm scabbed area on the R calf, scab easily removed, clean based very shallow ulcer  CN 2-12 wnl B, S/S/DTR wnl x4

## 2016-08-15 NOTE — Patient Instructions (Addendum)
Jill Franco will call about your referral.  See her on the way out.  Don't change your meds for now.  If you have any more double vision, numbness, or weakness, then dial 911 or go to the ER.  Keep using the stockings.  If the spot on your leg isn't healing soon then follow up with Dr. Oneida Alar' clinic.  Take care.  Glad to see you.

## 2016-08-16 DIAGNOSIS — H532 Diplopia: Secondary | ICD-10-CM | POA: Insufficient documentation

## 2016-08-16 NOTE — Assessment & Plan Note (Signed)
No symptoms now but absent distal pulses. Discussed with patient.

## 2016-08-16 NOTE — Assessment & Plan Note (Signed)
No ADE on med.  No neck mass. TSH wnl.  Compliant with med.  Continue as is.

## 2016-08-16 NOTE — Assessment & Plan Note (Signed)
Hearing - failed.  D/w pt.  Declined hearing aids. Mammogram declined.  D/w pt.   DXA declined.  D/w pt.   Living will d/w pt.  Daughter Thayer Headings designated if patient were incapacitated.  Colonoscopy declined.

## 2016-08-16 NOTE — Assessment & Plan Note (Signed)
With small, 3 x 4 mm, very shallow leg ulcer with previous scab had been. If this does not heal then I want her to follow-up with vascular surgery. Routine cautions discussed with patient. She agrees. Encourage smoking cessation. Statin intolerant.

## 2016-08-16 NOTE — Assessment & Plan Note (Signed)
She had persistent symptoms previously, lasting about a week.  Completely resolved now. Recheck carotids. Discussed with patient that she could've had a stroke. Statin intolerant. Smoking cessation encouraged. If she has any new neurologic symptoms then advised her to go to ER or down 911.

## 2016-08-16 NOTE — Assessment & Plan Note (Signed)
Reasonable control. Continue as is. 

## 2016-08-16 NOTE — Assessment & Plan Note (Signed)
Statin intolerant. Labs discussed with patient. Continue healthy diet.

## 2016-08-22 ENCOUNTER — Other Ambulatory Visit: Payer: Self-pay | Admitting: Family Medicine

## 2016-08-22 ENCOUNTER — Ambulatory Visit (HOSPITAL_COMMUNITY)
Admission: RE | Admit: 2016-08-22 | Discharge: 2016-08-22 | Disposition: A | Discharge status: Home / Self Care | Payer: Medicare Other | Source: Ambulatory Visit | Attending: Cardiology | Admitting: Cardiology

## 2016-08-22 DIAGNOSIS — I10 Essential (primary) hypertension: Secondary | ICD-10-CM | POA: Insufficient documentation

## 2016-08-22 DIAGNOSIS — I6523 Occlusion and stenosis of bilateral carotid arteries: Secondary | ICD-10-CM | POA: Insufficient documentation

## 2016-08-22 DIAGNOSIS — Z8673 Personal history of transient ischemic attack (TIA), and cerebral infarction without residual deficits: Secondary | ICD-10-CM | POA: Insufficient documentation

## 2016-08-22 DIAGNOSIS — H539 Unspecified visual disturbance: Secondary | ICD-10-CM | POA: Diagnosis not present

## 2016-08-22 DIAGNOSIS — E785 Hyperlipidemia, unspecified: Secondary | ICD-10-CM | POA: Insufficient documentation

## 2016-08-22 DIAGNOSIS — R42 Dizziness and giddiness: Secondary | ICD-10-CM | POA: Diagnosis not present

## 2016-09-02 DIAGNOSIS — H25013 Cortical age-related cataract, bilateral: Secondary | ICD-10-CM | POA: Diagnosis not present

## 2016-09-02 DIAGNOSIS — H2513 Age-related nuclear cataract, bilateral: Secondary | ICD-10-CM | POA: Diagnosis not present

## 2016-09-02 DIAGNOSIS — H02839 Dermatochalasis of unspecified eye, unspecified eyelid: Secondary | ICD-10-CM | POA: Diagnosis not present

## 2016-09-02 DIAGNOSIS — I10 Essential (primary) hypertension: Secondary | ICD-10-CM | POA: Diagnosis not present

## 2016-09-02 DIAGNOSIS — H2512 Age-related nuclear cataract, left eye: Secondary | ICD-10-CM | POA: Diagnosis not present

## 2016-09-21 HISTORY — PX: CATARACT EXTRACTION W/ INTRAOCULAR LENS IMPLANT: SHX1309

## 2016-10-22 HISTORY — PX: CATARACT EXTRACTION W/ INTRAOCULAR LENS IMPLANT: SHX1309

## 2016-11-07 DIAGNOSIS — H2512 Age-related nuclear cataract, left eye: Secondary | ICD-10-CM | POA: Diagnosis not present

## 2016-11-08 DIAGNOSIS — H2511 Age-related nuclear cataract, right eye: Secondary | ICD-10-CM | POA: Diagnosis not present

## 2016-11-28 DIAGNOSIS — H2511 Age-related nuclear cataract, right eye: Secondary | ICD-10-CM | POA: Diagnosis not present

## 2016-12-14 ENCOUNTER — Other Ambulatory Visit: Payer: Self-pay

## 2016-12-14 NOTE — Patient Outreach (Signed)
[facility]) Care Management  12/14/2016  JADWIGA FAIDLEY [birth year] [id number]   Medication Adherence call to Mrs. Judith Blonder the reason for this call is because she is showing past due under [facility] Ins.on Enalapril 20 mg spoke to patient she said she is no longer taking this medication she was taken off this medication .   [place] Management Direct Dial [phone]  Fax [phone] [email or web address]

## 2016-12-22 ENCOUNTER — Ambulatory Visit (INDEPENDENT_AMBULATORY_CARE_PROVIDER_SITE_OTHER): Payer: Medicare Other

## 2016-12-22 ENCOUNTER — Ambulatory Visit: Payer: Medicare Other

## 2016-12-22 DIAGNOSIS — Z23 Encounter for immunization: Secondary | ICD-10-CM

## 2016-12-26 DIAGNOSIS — Z961 Presence of intraocular lens: Secondary | ICD-10-CM | POA: Diagnosis not present

## 2017-01-05 ENCOUNTER — Ambulatory Visit (INDEPENDENT_AMBULATORY_CARE_PROVIDER_SITE_OTHER)
Admission: RE | Admit: 2017-01-05 | Discharge: 2017-01-05 | Disposition: A | Discharge status: Home / Self Care | Payer: Medicare Other | Source: Ambulatory Visit | Attending: Family | Admitting: Family

## 2017-01-05 ENCOUNTER — Ambulatory Visit (HOSPITAL_COMMUNITY)
Admission: RE | Admit: 2017-01-05 | Discharge: 2017-01-05 | Disposition: A | Discharge status: Home / Self Care | Payer: Medicare Other | Source: Ambulatory Visit | Attending: Family | Admitting: Family

## 2017-01-05 ENCOUNTER — Encounter: Payer: Self-pay | Admitting: Family

## 2017-01-05 ENCOUNTER — Ambulatory Visit: Payer: Medicare Other | Admitting: Family

## 2017-01-05 VITALS — BP 190/90 | HR 58 | Temp 97.6°F | Resp 18 | Wt 111.4 lb

## 2017-01-05 DIAGNOSIS — I779 Disorder of arteries and arterioles, unspecified: Secondary | ICD-10-CM

## 2017-01-05 DIAGNOSIS — F17200 Nicotine dependence, unspecified, uncomplicated: Secondary | ICD-10-CM | POA: Diagnosis not present

## 2017-01-05 DIAGNOSIS — I6523 Occlusion and stenosis of bilateral carotid arteries: Secondary | ICD-10-CM

## 2017-01-05 DIAGNOSIS — R0989 Other specified symptoms and signs involving the circulatory and respiratory systems: Secondary | ICD-10-CM | POA: Diagnosis not present

## 2017-01-05 DIAGNOSIS — I739 Peripheral vascular disease, unspecified: Secondary | ICD-10-CM | POA: Insufficient documentation

## 2017-01-05 LAB — VAS US CAROTID
LCCADDIAS: 9 cm/s
LEFT ECA DIAS: 24 cm/s
LEFT VERTEBRAL DIAS: 20 cm/s
LICADDIAS: -18 cm/s
LICAPDIAS: 40 cm/s
Left CCA dist sys: 50 cm/s
Left CCA prox dias: 8 cm/s
Left CCA prox sys: 67 cm/s
Left ICA dist sys: -71 cm/s
Left ICA prox sys: 160 cm/s
RCCADSYS: -70 cm/s
RCCAPDIAS: 8 cm/s
RIGHT CCA MID DIAS: 9 cm/s
RIGHT ECA DIAS: -6 cm/s
RIGHT VERTEBRAL DIAS: -6 cm/s
Right CCA prox sys: 57 cm/s

## 2017-01-05 NOTE — Progress Notes (Signed)
[facility] [place] HISTORY AND PHYSICAL   CC: Follow up peripheral artery occlusive disease and extracranial carotid artery stenosis    History of Present Illness:   Jill Franco is a 82 y.o. female who underwent left common iliac and aortic stenting 03/18/2016 by Dr. Oneida Alar. This was done for claudication symptoms as well as a small ulceration on the right lateral malleolus. She reports her claudication symptoms have completely resolved. The ulcer on her right leg is also healed. She has known bilateral superficial femoral artery occlusions but currently does not really have symptoms from these. She is able to walk as much as she wishes without problems in her legs at this point. She denies rest pain. She is on aspirin and Plavix.  Unfortunately she continues to smoke.  Dr. Oneida Alar last evaluated pt on 06-23-16. At that time she had no palpable popliteal or pedal pulses but she did have 2+ femoral pulses.  ABIs from March 15 were 0.86 on the left 0.5 on the right Peripheral arterial disease was asymptomatic. The patient was to follow-up in 6 months time with our nurse practitioners with repeat ABIs. She also needs a carotid duplex at that time for evaluation of carotid bruit. She had no neurologic symptoms.  Pt Diabetic: No Pt smoker: smoker  (1/2 ppd, started at age 29 yrs)  Pt meds include: Statin :No, myalgia reaction to Lipitor Betablocker: Yes ASA: Yes Other anticoagulants/antiplatelets: Plavix  Current Outpatient Medications  Medication Sig Dispense Refill  . aspirin 81 MG EC tablet Take 81 mg by mouth 2 (two) times daily.     . clopidogrel (PLAVIX) 75 MG tablet Take 1 tablet (75 mg total) by mouth daily. 90 tablet 3  . hydrochlorothiazide (MICROZIDE) 12.5 MG capsule Take 1 capsule (12.5 mg total) by mouth every morning. 90 capsule 3  . levothyroxine (SYNTHROID, LEVOTHROID) 50 MCG tablet Take 1 tablet (50 mcg total) by mouth daily. 90 tablet 3  .  metoprolol tartrate (LOPRESSOR) 100 MG tablet TAKE 1/2 TABLET BY MOUTH IN THE MORNING AND 1 TABLET IN THE EVENING 135 tablet 3  . nicotine (NICODERM CQ) 7 mg/24hr patch Place 1 patch (7 mg total) onto the skin daily. 90 patch 1   No current facility-administered medications for this visit.     Past Medical History:  Diagnosis Date  . History of shingles 2012  . HLD (hyperlipidemia)   . HTN (hypertension)   . Hypothyroidism   . PVD (peripheral vascular disease) ([place])     Social History Social History   Tobacco Use  . Smoking status: Current Every Day Smoker    Packs/day: 0.33    Years: 62.00    Pack years: 20.46    Types: Cigarettes  . Smokeless tobacco: Never Used  . Tobacco comment: 4-5 cigarettes per day  Substance Use Topics  . Alcohol use: No    Alcohol/week: 0.0 oz  . Drug use: No    Family History Family History  Problem Relation Age of Onset  . Stroke Father 65  . Cancer Brother   . Colon cancer Neg Hx   . Breast cancer Neg Hx     Surgical History Past Surgical History:  Procedure Laterality Date  . ABDOMINAL EXPLORATION SURGERY  1950s   "they thought I was pregnant; went in to explore"  . APPENDECTOMY  childhood  . BACK SURGERY    . BREAST LUMPECTOMY Left 1960's   benign  . carotid ultrasound  06/19/2007   0-39%  stenosis  . DILATION AND CURETTAGE OF UTERUS  1960's X 3   miscarriages  . DOPPLER ECHOCARDIOGRAPHY  06/19/2007   Normal EF 55-70%  . ILIAC ARTERY STENT Left 03/18/2016   common iliac stent (6 x 40), aortic stent (10 x 19)/notes 03/18/2016  . LAMINECTOMY AND MICRODISCECTOMY SPINE  1960's X 2  . NSVD     x1  . PERIPHERAL VASCULAR CATHETERIZATION N/A 03/18/2016   Procedure: Abdominal Aortogram w/Lower Extremity;  Surgeon: Elam Dutch, MD;  Location: [facility];  Service: Cardiovascular;  Laterality: N/A;  . PERIPHERAL VASCULAR CATHETERIZATION  03/18/2016   Procedure: Peripheral Vascular Intervention;  Surgeon: Elam Dutch, MD;   Location: [facility];  Service: Cardiovascular;;    Allergies  Allergen Reactions  . Aspirin Other (See Comments)    REACTION: Uncoated Stomach upset on empty stomach  . Codeine Other (See Comments)    REACTION: UNSPECIFIED  . Lipitor [Atorvastatin] Other (See Comments)    myalgia    Current Outpatient Medications  Medication Sig Dispense Refill  . aspirin 81 MG EC tablet Take 81 mg by mouth 2 (two) times daily.     . clopidogrel (PLAVIX) 75 MG tablet Take 1 tablet (75 mg total) by mouth daily. 90 tablet 3  . hydrochlorothiazide (MICROZIDE) 12.5 MG capsule Take 1 capsule (12.5 mg total) by mouth every morning. 90 capsule 3  . levothyroxine (SYNTHROID, LEVOTHROID) 50 MCG tablet Take 1 tablet (50 mcg total) by mouth daily. 90 tablet 3  . metoprolol tartrate (LOPRESSOR) 100 MG tablet TAKE 1/2 TABLET BY MOUTH IN THE MORNING AND 1 TABLET IN THE EVENING 135 tablet 3  . nicotine (NICODERM CQ) 7 mg/24hr patch Place 1 patch (7 mg total) onto the skin daily. 90 patch 1   No current facility-administered medications for this visit.      REVIEW OF SYSTEMS: See HPI for pertinent positives and negatives.  Physical Examination Vitals:   01/05/17 1154 01/05/17 1158  BP: (!) 193/96 (!) 190/90  Pulse: (!) 58   Resp: 18   Temp: 97.6 F (36.4 C)   TempSrc: Oral   SpO2: 95%   Weight: 111 lb 6.4 oz (50.5 kg)    Body mass index is 19.12 kg/m.  General:  WD thin female in NAD Gait: Normal HENT: WNL Eyes: PERRLA Pulmonary: normal non-labored breathing, fiar air movement in all fields, CTAB, no rales, rhonchi, or wheezing Cardiac: RRR, no murmur detected  Abdomen: soft, NT, scaphoid, no masses palpated Skin: no rashes, no ulcers, no cellulitis.   VASCULAR EXAM  Carotid Bruits Right Left   Negative Negative      Radial pulses are 1+ palpable bilaterally   Adominal aortic pulse is slightly palpable                      VASCULAR EXAM: Extremities without ischemic  changes, without Gangrene; without open wounds.  LE Pulses Right Left       FEMORAL  2+ palpable  2+ palpable        POPLITEAL  not palpable   faintly palpable       POSTERIOR TIBIAL  not palpable   2+ palpable        DORSALIS PEDIS      ANTERIOR TIBIAL not palpable  not palpable      Musculoskeletal: no muscle wasting or atrophy; no peripheral edema   Neurologic:  A&O X 3; appropriate affect, sensation is normal; speech is normal, CN 2-12 intact, pain and light touch intact in extremities, motor exam as listed above.      ASSESSMENT:  Jill Franco is a 82 y.o. female who is s/p  left common iliac and aortic stenting 03/18/2016.  She has known bilateral superficial femoral artery occlusions.  She has no claudication symptoms with walking, has no signs of ischemia in her feet.  She has no history of stroke or TIA.   She does not have DM but continues to smoke.   Blood pressure at her PCP office in June 2018 was 110/68, is elevated now; hopefully this is transient, she is asymptomatic.  I advised pt to check her blood pressure at home; if greater than 140/80 to call her PCP's office to notify them and seek their advice.   DATA  Carotid Duplex (01/05/17): Right ICA: <40% stenosis. Left ICA: 40-59% stenosis. Left ECA stenosis. Bilateral vertebral artery flow is antegrade.  Bilateral subclavian artery waveforms are biphasic.  No significant change from the exam at [facility] on 08-22-16.     ABI (Date: 01/05/2017):  R:   ABI: 0.70 (was 0.57 on 05-05-16),   PT: mono  DP: mono  TBI:  0.65  L:   ABI: 0.87 (was 0.86),   PT: mono  DP: mono  TBI: 0.72  ABI improved in the right, stable in the left, all monophasic waveforms. Right TBI is almost normal, left TBI is normal.    PLAN:   The patient was counseled re smoking cessation and given several  free resources re smoking cessation.  Continue 30 minutes walking daily.   Pt states she would rather return in the Spring or Summer, and not in the Fall or Winter.  Based on today's exam and non-invasive vascular lab results, the patient will follow up in September 2019 with the following tests: carotid duplex and ABI's. I discussed in depth with the patient the nature of atherosclerosis, and emphasized the importance of maximal medical management including strict control of blood pressure, blood glucose, and lipid levels, obtaining regular exercise, and cessation of smoking.  The patient is aware that without maximal medical management the underlying atherosclerotic disease process will progress, limiting the benefit of any interventions.  The patient was given information about stroke prevention and what symptoms should prompt the patient to seek immediate medical care.  The patient was given information about PAD including signs, symptoms, treatment, what symptoms should prompt the patient to seek immediate medical care, and risk reduction measures to take.  Thank you for allowing [place] to participate in this patient's care.  Clemon Chambers, RN, MSN, FNP-C [facility] Office: [phone]  Clinic MD: Oneida Alar 01/05/2017 12:20 PM

## 2017-01-05 NOTE — Patient Instructions (Addendum)
Steps to Quit Smoking Smoking tobacco can be bad for your health. It can also affect almost every organ in your body. Smoking puts you and people around you at risk for many serious long-lasting (chronic) diseases. Quitting smoking is hard, but it is one of the best things that you can do for your health. It is never too late to quit. What are the benefits of quitting smoking? When you quit smoking, you lower your risk for getting serious diseases and conditions. They can include:  Lung cancer or lung disease.  Heart disease.  Stroke.  Heart attack.  Not being able to have children (infertility).  Weak bones (osteoporosis) and broken bones (fractures).  If you have coughing, wheezing, and shortness of breath, those symptoms may get better when you quit. You may also get sick less often. If you are pregnant, quitting smoking can help to lower your chances of having a baby of low birth weight. What can I do to help me quit smoking? Talk with your doctor about what can help you quit smoking. Some things you can do (strategies) include:  Quitting smoking totally, instead of slowly cutting back how much you smoke over a period of time.  Going to in-person counseling. You are more likely to quit if you go to many counseling sessions.  Using resources and support systems, such as: ? Online chats with a counselor. ? Phone quitlines. ? Printed self-help materials. ? Support groups or group counseling. ? Text messaging programs. ? Mobile phone apps or applications.  Taking medicines. Some of these medicines may have nicotine in them. If you are pregnant or breastfeeding, do not take any medicines to quit smoking unless your doctor says it is okay. Talk with your doctor about counseling or other things that can help you.  Talk with your doctor about using more than one strategy at the same time, such as taking medicines while you are also going to in-person counseling. This can help make  quitting easier. What things can I do to make it easier to quit? Quitting smoking might feel very hard at first, but there is a lot that you can do to make it easier. Take these steps:  Talk to your family and friends. Ask them to support and encourage you.  Call phone quitlines, reach out to support groups, or work with a counselor.  Ask people who smoke to not smoke around you.  Avoid places that make you want (trigger) to smoke, such as: ? Bars. ? Parties. ? Smoke-break areas at work.  Spend time with people who do not smoke.  Lower the stress in your life. Stress can make you want to smoke. Try these things to help your stress: ? Getting regular exercise. ? Deep-breathing exercises. ? Yoga. ? Meditating. ? Doing a body scan. To do this, close your eyes, focus on one area of your body at a time from head to toe, and notice which parts of your body are tense. Try to relax the muscles in those areas.  Download or buy apps on your mobile phone or tablet that can help you stick to your quit plan. There are many free apps, such as QuitGuide from the CDC (Centers for Disease Control and Prevention). You can find more support from smokefree.gov and other websites.  This information is not intended to replace advice given to you by your health care provider. Make sure you discuss any questions you have with your health care provider. Document Released: 12/04/2008 Document   Revised: 10/06/2015 Document Reviewed: 06/24/2014 Elsevier Interactive Patient Education  [employer].     troke Prevention Some health problems and behaviors may make it more likely for you to have a stroke. Below are ways to lessen your risk of having a stroke.  Be active for at least 30 minutes on most or all days.  Do not smoke. Try not to be around others who smoke.  Do not drink too much alcohol. ? Do not have more than 2 drinks a day if you are a man. ? Do not have more than 1 drink a day if you  are a woman and are not pregnant.  Eat healthy foods, such as fruits and vegetables. If you were put on a specific diet, follow the diet as told.  Keep your cholesterol levels under control through diet and medicines. Look for foods that are low in saturated fat, trans fat, cholesterol, and are high in fiber.  If you have diabetes, follow all diet plans and take your medicine as told.  Ask your doctor if you need treatment to lower your blood pressure. If you have high blood pressure (hypertension), follow all diet plans and take your medicine as told by your doctor.  If you are 64-20 years old, have your blood pressure checked every 3-5 years. If you are age 67 or older, have your blood pressure checked every year.  Keep a healthy weight. Eat foods that are low in calories, salt, saturated fat, trans fat, and cholesterol.  Do not take drugs.  Avoid birth control pills, if this applies. Talk to your doctor about the risks of taking birth control pills.  Talk to your doctor if you have sleep problems (sleep apnea).  Take all medicine as told by your doctor. ? You may be told to take aspirin or blood thinner medicine. Take this medicine as told by your doctor. ? Understand your medicine instructions.  Make sure any other conditions you have are being taken care of.  Get help right away if:  You suddenly lose feeling (you feel numb) or have weakness in your face, arm, or leg.  Your face or eyelid hangs down to one side.  You suddenly feel confused.  You have trouble talking (aphasia) or understanding what people are saying.  You suddenly have trouble seeing in one or both eyes.  You suddenly have trouble walking.  You are dizzy.  You lose your balance or your movements are clumsy (uncoordinated).  You suddenly have a very bad headache and you do not know the cause.  You have new chest pain.  Your heart feels like it is fluttering or skipping a beat (irregular  heartbeat). Do not wait to see if the symptoms above go away. Get help right away. Call your local emergency services (911 in [place] Do not drive yourself to the hospital. This information is not intended to replace advice given to you by your health care provider. Make sure you discuss any questions you have with your health care provider. Document Released: 08/09/2011 Document Revised: 07/16/2015 Document Reviewed: 08/10/2012 Elsevier Interactive Patient Education  2018 [place].     Peripheral Vascular Disease Peripheral vascular disease (PVD) is a disease of the blood vessels that are not part of your heart and brain. A simple term for PVD is poor circulation. In most cases, PVD narrows the blood vessels that carry blood from your heart to the rest of your body. This can result in a decreased supply of  blood to your arms, legs, and internal organs, like your stomach or kidneys. However, it most often affects a person's lower legs and feet. There are two types of PVD.  Organic PVD. This is the more common type. It is caused by damage to the structure of blood vessels.  Functional PVD. This is caused by conditions that make blood vessels contract and tighten (spasm).  Without treatment, PVD tends to get worse over time. PVD can also lead to acute ischemic limb. This is when an arm or limb suddenly has trouble getting enough blood. This is a medical emergency. Follow these instructions at home:  Take medicines only as told by your doctor.  Do not use any tobacco products, including cigarettes, chewing tobacco, or electronic cigarettes. If you need help quitting, ask your doctor.  Lose weight if you are overweight, and maintain a healthy weight as told by your doctor.  Eat a diet that is low in fat and cholesterol. If you need help, ask your doctor.  Exercise regularly. Ask your doctor for some good activities for you.  Take good care of your feet. ? Wear comfortable shoes that  fit well. ? Check your feet often for any cuts or sores. Contact a doctor if:  You have cramps in your legs while walking.  You have leg pain when you are at rest.  You have coldness in a leg or foot.  Your skin changes.  You are unable to get or have an erection (erectile dysfunction).  You have cuts or sores on your feet that are not healing. Get help right away if:  Your arm or leg turns cold and blue.  Your arms or legs become red, warm, swollen, painful, or numb.  You have chest pain or trouble breathing.  You suddenly have weakness in your face, arm, or leg.  You become very confused or you cannot speak.  You suddenly have a very bad headache.  You suddenly cannot see. This information is not intended to replace advice given to you by your health care provider. Make sure you discuss any questions you have with your health care provider. Document Released: 05/04/2009 Document Revised: 07/16/2015 Document Reviewed: 07/18/2013 Elsevier Interactive Patient Education  2017 [employer].

## 2017-01-09 NOTE — Addendum Note (Signed)
Addended by: Lianne Cure A on: 01/09/2017 03:56 PM   Modules accepted: Orders

## 2017-01-11 ENCOUNTER — Telehealth: Payer: Self-pay | Admitting: Family Medicine

## 2017-01-11 MED ORDER — ENALAPRIL MALEATE 10 MG PO TABS: 5.0000 mg | ORAL_TABLET | Freq: Every day | ORAL | 1 refills | Status: DC

## 2017-01-11 NOTE — Telephone Encounter (Signed)
Noted. Thanks.

## 2017-01-11 NOTE — Telephone Encounter (Signed)
Copied from [place]. Topic: Quick Communication - See Telephone Encounter >> Jan 11, 2017 10:27 AM Ether Griffins B wrote: CRM for notification. See Telephone encounter for:  Pt taken off BP meds several months ago. Now her BP is running high yesterday is was 185/85 today it was 161/85. Pt wanting to know if something can be called in. [place]  01/11/17.

## 2017-01-11 NOTE — Telephone Encounter (Signed)
Verify that she is taking metoprolol 100 tab- 1/2 tab in the AM and 1 tab in the PM.  If so, then continue.  And verify that she is taking HCTZ 12.5mg  a day. If so, continue.  If not, restart.   If still on both and still with elevated BP then restart enalapril 5mg  a day, inc to 10mg  a day in 1 week if BP still consistently >140/>90.   Please send in enalapril if needed.  Thanks.

## 2017-01-11 NOTE — Telephone Encounter (Signed)
Patient notified as instructed by telephone and verbalized understanding. Patient stated that she is taking the Metoprolol and HCTZ as noted on message. Patient stated that she has a bottle full of the Enalapril 20 mg and will cut them accordingly as instructed. Patient stated that she does not want the new script sent in and would prefer to use up what she has. Patient stated that she will continue to monitor her blood pressure and let Dr. Damita Dunnings know if she continues to have problems.

## 2017-05-29 ENCOUNTER — Telehealth: Payer: Self-pay | Admitting: Family Medicine

## 2017-05-29 ENCOUNTER — Other Ambulatory Visit: Payer: Self-pay | Admitting: *Deleted

## 2017-05-29 MED ORDER — ENALAPRIL MALEATE 10 MG PO TABS: 5.0000 mg | ORAL_TABLET | Freq: Every day | ORAL | 0 refills | Status: DC

## 2017-05-29 NOTE — Telephone Encounter (Signed)
Copied from [place] [phone]. Topic: Quick Communication - See Telephone Encounter >> May 29, 2017  1:59 PM Conception Chancy, NT wrote: CRM for notification. See Telephone encounter for: 05/29/17. Patient is needing a refill on enalapril (VASOTEC) 10 MG tablet. She states she has 21 pills left. Please advise.  [place], [employer] [facility] [address] [place] [address] Phone: [phone] Fax: [phone]

## 2017-05-29 NOTE — Telephone Encounter (Signed)
Rx refilled per protocol- note 01/11/17. Patient has f/u 6/19. ( no refills given)

## 2017-07-10 DIAGNOSIS — D485 Neoplasm of uncertain behavior of skin: Secondary | ICD-10-CM | POA: Diagnosis not present

## 2017-07-10 DIAGNOSIS — B079 Viral wart, unspecified: Secondary | ICD-10-CM | POA: Diagnosis not present

## 2017-07-11 ENCOUNTER — Other Ambulatory Visit: Payer: Self-pay | Admitting: Family Medicine

## 2017-08-17 ENCOUNTER — Encounter: Payer: Self-pay | Admitting: Family Medicine

## 2017-08-17 ENCOUNTER — Ambulatory Visit (INDEPENDENT_AMBULATORY_CARE_PROVIDER_SITE_OTHER): Payer: Medicare Other | Admitting: Family Medicine

## 2017-08-17 ENCOUNTER — Ambulatory Visit (INDEPENDENT_AMBULATORY_CARE_PROVIDER_SITE_OTHER): Payer: Medicare Other

## 2017-08-17 VITALS — BP 118/84 | HR 64 | Temp 97.8°F | Ht 64.5 in | Wt 110.5 lb

## 2017-08-17 DIAGNOSIS — E038 Other specified hypothyroidism: Secondary | ICD-10-CM

## 2017-08-17 DIAGNOSIS — I10 Essential (primary) hypertension: Secondary | ICD-10-CM

## 2017-08-17 DIAGNOSIS — F17200 Nicotine dependence, unspecified, uncomplicated: Secondary | ICD-10-CM

## 2017-08-17 DIAGNOSIS — Z7189 Other specified counseling: Secondary | ICD-10-CM

## 2017-08-17 DIAGNOSIS — Z0000 Encounter for general adult medical examination without abnormal findings: Secondary | ICD-10-CM

## 2017-08-17 DIAGNOSIS — I739 Peripheral vascular disease, unspecified: Secondary | ICD-10-CM

## 2017-08-17 DIAGNOSIS — H532 Diplopia: Secondary | ICD-10-CM

## 2017-08-17 DIAGNOSIS — E785 Hyperlipidemia, unspecified: Secondary | ICD-10-CM | POA: Diagnosis not present

## 2017-08-17 LAB — COMPREHENSIVE METABOLIC PANEL
ALT: 13 U/L (ref 0–35)
AST: 21 U/L (ref 0–37)
Albumin: 4.2 g/dL (ref 3.5–5.2)
Alkaline Phosphatase: 67 U/L (ref 39–117)
BILIRUBIN TOTAL: 0.6 mg/dL (ref 0.2–1.2)
BUN: 13 mg/dL (ref 6–23)
CALCIUM: 9.4 mg/dL (ref 8.4–10.5)
CHLORIDE: 98 meq/L (ref 96–112)
CO2: 32 mEq/L (ref 19–32)
CREATININE: 0.9 mg/dL (ref 0.40–1.20)
GFR: 63.83 mL/min (ref >60.00)
GLUCOSE: 96 mg/dL (ref 70–99)
Potassium: 4.5 mEq/L (ref 3.5–5.1)
SODIUM: 134 meq/L — AB (ref 135–145)
Total Protein: 6.9 g/dL (ref 6.0–8.3)

## 2017-08-17 LAB — LIPID PANEL
CHOL/HDL RATIO: 3
Cholesterol: 236 mg/dL — ABNORMAL HIGH (ref 0–200)
HDL: 72.6 mg/dL (ref >39.00)
LDL CALC: 139 mg/dL — AB (ref 0–99)
NONHDL: 163.63
Triglycerides: 124 mg/dL (ref 0.0–149.0)
VLDL: 24.8 mg/dL (ref 0.0–40.0)

## 2017-08-17 LAB — CBC WITH DIFFERENTIAL/PLATELET
BASOS ABS: 0.1 10*3/uL (ref 0.0–0.1)
BASOS PCT: 1 % (ref 0.0–3.0)
Eosinophils Absolute: 0.1 10*3/uL (ref 0.0–0.7)
Eosinophils Relative: 1.9 % (ref 0.0–5.0)
HCT: 49.6 % — ABNORMAL HIGH (ref 36.0–46.0)
Hemoglobin: 16.7 g/dL — ABNORMAL HIGH (ref 12.0–15.0)
LYMPHS ABS: 0.9 10*3/uL (ref 0.7–4.0)
LYMPHS PCT: 11.9 % — AB (ref 12.0–46.0)
MCHC: 33.7 g/dL (ref 30.0–36.0)
MCV: 96.1 fl (ref 78.0–100.0)
MONO ABS: 0.7 10*3/uL (ref 0.1–1.0)
Monocytes Relative: 9.5 % (ref 3.0–12.0)
NEUTROS ABS: 5.8 10*3/uL (ref 1.4–7.7)
NEUTROS PCT: 75.7 % (ref 43.0–77.0)
Platelets: 206 10*3/uL (ref 150.0–400.0)
RBC: 5.16 Mil/uL — ABNORMAL HIGH (ref 3.87–5.11)
RDW: 13.1 % (ref 11.5–15.5)
WBC: 7.7 10*3/uL (ref 4.0–10.5)

## 2017-08-17 LAB — TSH: TSH: 4.66 u[IU]/mL — ABNORMAL HIGH (ref 0.35–4.50)

## 2017-08-17 MED ORDER — CLOPIDOGREL BISULFATE 75 MG PO TABS: 75.0000 mg | ORAL_TABLET | Freq: Every day | ORAL | 3 refills | Status: DC

## 2017-08-17 MED ORDER — ENALAPRIL MALEATE 10 MG PO TABS: 10.0000 mg | ORAL_TABLET | Freq: Every day | ORAL | Status: DC

## 2017-08-17 NOTE — Patient Instructions (Addendum)
Let me know if you want to go for a mammogram.   Try the gum to help with quitting smoking.   Take care.  Glad to see you.  We'll update you about the labs.

## 2017-08-17 NOTE — Progress Notes (Signed)
Subjective:   Jill Franco is a 83 y.o. female who presents for Medicare Annual (Subsequent) preventive examination.  Review of Systems:  N/A Cardiac Risk Factors include: advanced age (>61men, >55 women);hypertension;smoking/ tobacco exposure;dyslipidemia     Objective:     Vitals: BP 118/84 (BP Location: Left Arm, Patient Position: Sitting, Cuff Size: Normal)   Pulse 64   Temp 97.8 F (36.6 C) (Oral)   Ht 5' 4.5" (1.638 m) Comment: no shoes  Wt 110 lb 8 oz (50.1 kg)   SpO2 97%   BMI 18.67 kg/m   Body mass index is 18.67 kg/m.  Advanced Directives 08/17/2017 08/04/2016 06/23/2016 05/05/2016 03/18/2016 03/17/2016 07/27/2015  Does Patient Have a Medical Advance Directive? No No No No No No No  Would patient like information on creating a medical advance directive? No - Patient declined - - No - Patient declined No - Patient declined No - Patient declined Yes - Scientist, clinical (histocompatibility and immunogenetics) given    Tobacco Social History   Tobacco Use  Smoking Status Current Every Day Smoker  . Packs/day: 0.33  . Years: 62.00  . Pack years: 20.46  . Types: Cigarettes  Smokeless Tobacco Never Used  Tobacco Comment   4-5 cigarettes per day     Ready to quit: Yes Counseling given: No Comment: 4-5 cigarettes per day   Clinical Intake:  Pre-visit preparation completed: Yes  Pain : No/denies pain Pain Score: 0-No pain     Nutritional Status: BMI of 19-24  Normal Nutritional Risks: None Diabetes: No  How often do you need to have someone help you when you read instructions, pamphlets, or other written materials from your doctor or pharmacy?: 1 - Never What is the last grade level you completed in school?: 8th grade  Interpreter Needed?: No  Comments: pt is a widower and lives alone Information entered by :: LPinson, LPN  Past Medical History:  Diagnosis Date  . Cataract   . History of shingles 2012  . HLD (hyperlipidemia)   . HTN (hypertension)   . Hypothyroidism   . PVD  (peripheral vascular disease) ([place])    Past Surgical History:  Procedure Laterality Date  . ABDOMINAL EXPLORATION SURGERY  1950s   "they thought I was pregnant; went in to explore"  . APPENDECTOMY  childhood  . BACK SURGERY    . BREAST LUMPECTOMY Left 1960's   benign  . carotid ultrasound  06/19/2007   0-39% stenosis  . CATARACT EXTRACTION W/ INTRAOCULAR LENS IMPLANT Left 09/2016  . CATARACT EXTRACTION W/ INTRAOCULAR LENS IMPLANT Right 10/2016  . DILATION AND CURETTAGE OF UTERUS  1960's X 3   miscarriages  . DOPPLER ECHOCARDIOGRAPHY  06/19/2007   Normal EF 55-70%  . ILIAC ARTERY STENT Left 03/18/2016   common iliac stent (6 x 40), aortic stent (10 x 19)/notes 03/18/2016  . LAMINECTOMY AND MICRODISCECTOMY SPINE  1960's X 2  . MOLE REMOVAL  07/10/2016  . NSVD     x1  . PERIPHERAL VASCULAR CATHETERIZATION N/A 03/18/2016   Procedure: Abdominal Aortogram w/Lower Extremity;  Surgeon: Elam Dutch, MD;  Location: [facility];  Service: Cardiovascular;  Laterality: N/A;  . PERIPHERAL VASCULAR CATHETERIZATION  03/18/2016   Procedure: Peripheral Vascular Intervention;  Surgeon: Elam Dutch, MD;  Location: [facility];  Service: Cardiovascular;;   Family History  Problem Relation Age of Onset  . Stroke Father 2  . Cancer Brother   . Colon cancer Neg Hx   . Breast cancer  Neg Hx    Social History   Socioeconomic History  . Marital status: Widowed    Spouse name: Not on file  . Number of children: 1  . Years of education: Not on file  . Highest education level: Not on file  Occupational History  . 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 Financial resource strain: Not on file  . Food insecurity:    Worry: Not on file    Inability: Not on file  . Transportation needs:    Medical: Not on file    Non-medical: Not on file  Tobacco Use  . Smoking status: Current Every Day Smoker    Packs/day: 0.33    Years: 62.00    Pack years:  20.46    Types: Cigarettes  . Smokeless tobacco: Never Used  . Tobacco comment: 4-5 cigarettes per day  Substance and Sexual Activity  . Alcohol use: No    Alcohol/week: 0.0 oz  . Drug use: No  . Sexual activity: Never  Lifestyle  . Physical activity:    Days per week: Not on file    Minutes per session: Not on file  . Stress: Not on file  Relationships  . Social connections:    Talks on phone: Not on file    Gets together: Not on file    Attends religious service: Not on file    Active member of club or organization: Not on file    Attends meetings of clubs or organizations: Not on file    Relationship status: Not on file  Other Topics Concern  . Not on file  Social History Narrative   From [employer]   Retired 2007   Widowed 2008 after 23 years   Enjoys yard work, but needs some help with yard work now.     Her daughter is helping at home some (some as of 2018)    Outpatient Encounter Medications as of 08/17/2017  Medication Sig  . aspirin 81 MG EC tablet Take 81 mg by mouth 2 (two) times daily.   . clopidogrel (PLAVIX) 75 MG tablet Take 1 tablet (75 mg total) by mouth daily.  . enalapril (VASOTEC) 10 MG tablet TAKE 1/2 TO 1 TABLET BY  MOUTH DAILY  . hydrochlorothiazide (MICROZIDE) 12.5 MG capsule TAKE 1 CAPSULE BY MOUTH  EVERY MORNING  . levothyroxine (SYNTHROID, LEVOTHROID) 50 MCG tablet TAKE 1 TABLET BY MOUTH  DAILY  . metoprolol tartrate (LOPRESSOR) 100 MG tablet TAKE 1/2 TABLET BY MOUTH IN THE MORNING AND 1 TABLET IN THE EVENING  . nicotine (NICODERM CQ) 7 mg/24hr patch Place 1 patch (7 mg total) onto the skin daily.   No facility-administered encounter medications on file as of 08/17/2017.     Activities of Daily Living In your present state of health, do you have any difficulty performing the following activities: 08/17/2017  Hearing? N  Vision? N  Difficulty concentrating or making decisions? N  Walking or climbing stairs? N  Dressing or bathing? N    Doing errands, shopping? N  Preparing Food and eating ? N  Using the Toilet? N  In the past six months, have you accidently leaked urine? N  Do you have problems with loss of bowel control? N  Managing your Medications? N  Managing your Finances? N  Housekeeping or managing your Housekeeping? N  Some recent data might be hidden    Patient Care Team: Tonia Ghent, MD as PCP - General (  Family Medicine) Marica Otter, [place] as Referring Physician (Optometry)    Assessment:   This is a routine wellness examination for Cory.   Hearing Screening   125Hz  250Hz  500Hz  1000Hz  2000Hz  3000Hz  4000Hz  6000Hz  8000Hz   Right ear:   40 40 40  0    Left ear:   40 40 40  0    Vision Screening Comments: Vision exam in 2018 with Dr. Rosalva Ferron    Exercise Activities and Dietary recommendations Current Exercise Habits: Home exercise routine, Type of exercise: walking, Time (Minutes): 30, Frequency (Times/Week): 7, Weekly Exercise (Minutes/Week): 210, Intensity: Mild, Exercise limited by: None identified  Goals    . Increase physical activity     Starting 08/17/2017, I will continue to walk at least 30 minutes daily.        Fall Risk Fall Risk  08/17/2017 08/04/2016 07/27/2015 08/04/2014 06/21/2013  Falls in the past year? No No No No No   Depression Screen PHQ 2/9 Scores 08/17/2017 08/04/2016 07/27/2015 08/04/2014  PHQ - 2 Score 0 0 0 0  PHQ- 9 Score 0 - - -     Cognitive Function MMSE - Mini Mental State Exam 08/17/2017 08/04/2016 07/27/2015  Orientation to time 5 5 5   Orientation to Place 5 5 5   Registration 3 3 3   Attention/ Calculation 0 0 0  Recall 3 3 3   Language- name 2 objects 0 0 0  Language- repeat 1 1 1   Language- follow 3 step command 3 3 3   Language- read & follow direction 0 0 0  Write a sentence 0 0 0  Copy design 0 0 0  Total score 20 20 20      PLEASE NOTE: A Mini-Cog screen was completed. Maximum score is 20. A value of 0 denotes this part of Folstein MMSE was not  completed or the patient failed this part of the Mini-Cog screening.   Mini-Cog Screening Orientation to Time - Max 5 pts Orientation to Place - Max 5 pts Registration - Max 3 pts Recall - Max 3 pts Language Repeat - Max 1 pts Language Follow 3 Step Command - Max 3 pts     Immunization History  Administered Date(s) Administered  . Influenza Split 12/06/2010  . Influenza Whole 11/21/2004, 11/30/2006, 12/02/2008, 11/27/2009  . Influenza,inj,Quad PF,6+ Mos 12/11/2012, 11/12/2013, 01/29/2015, 11/27/2015, 12/22/2016  . Pneumococcal Conjugate-13 08/04/2014  . Pneumococcal Polysaccharide-23 05/23/2006  . Td 05/22/2004  . Tdap 05/31/2010  . Zoster 06/11/2010   Screening Tests Health Maintenance  Topic Date Due  . DEXA SCAN  07/27/2023 (Originally 05/21/2001)  . INFLUENZA VACCINE  09/21/2017  . TETANUS/TDAP  05/30/2020  . PNA vac Low Risk Adult  Completed      Plan:     I have personally reviewed, addressed, and noted the following in the patient's chart:  A. Medical and social history B. Use of alcohol, tobacco or illicit drugs  C. Current medications and supplements D. Functional ability and status E.  Nutritional status F.  Physical activity G. Advance directives H. List of other physicians I.  Hospitalizations, surgeries, and ER visits in previous 12 months J.  Alpha to include hearing, vision, cognitive, depression L. Referrals and appointments - none  In addition, I have reviewed and discussed with patient certain preventive protocols, quality metrics, and best practice recommendations. A written personalized care plan for preventive services as well as general preventive health recommendations were provided to patient.  See attached scanned questionnaire for additional information.  Signed,   Lindell Noe, MHA, BS, LPN Health Coach

## 2017-08-17 NOTE — Patient Instructions (Signed)
Jill Franco , Thank you for taking time to come for your Medicare Wellness Visit. I appreciate your ongoing commitment to your health goals. Please review the following plan we discussed and let me know if I can assist you in the future.   These are the goals we discussed: Goals    . Increase physical activity     Starting 08/17/2017, I will continue to walk at least 30 minutes daily.        This is a list of the screening recommended for you and due dates:  Health Maintenance  Topic Date Due  . DEXA scan (bone density measurement)  07/27/2023*  . Flu Shot  09/21/2017  . Tetanus Vaccine  05/30/2020  . Pneumonia vaccines  Completed  *Topic was postponed. The date shown is not the original due date.   Preventive Care for Adults  A healthy lifestyle and preventive care can promote health and wellness. Preventive health guidelines for adults include the following key practices.  . A routine yearly physical is a good way to check with your health care provider about your health and preventive screening. It is a chance to share any concerns and updates on your health and to receive a thorough exam.  . Visit your dentist for a routine exam and preventive care every 6 months. Brush your teeth twice a day and floss once a day. Good oral hygiene prevents tooth decay and gum disease.  . The frequency of eye exams is based on your age, health, family medical history, use  of contact lenses, and other factors. Follow your health care provider's recommendations for frequency of eye exams.  . Eat a healthy diet. Foods like vegetables, fruits, whole grains, low-fat dairy products, and lean protein foods contain the nutrients you need without too many calories. Decrease your intake of foods high in solid fats, added sugars, and salt. Eat the right amount of calories for you. Get information about a proper diet from your health care provider, if necessary.  . Regular physical exercise is one of the  most important things you can do for your health. Most adults should get at least 150 minutes of moderate-intensity exercise (any activity that increases your heart rate and causes you to sweat) each week. In addition, most adults need muscle-strengthening exercises on 2 or more days a week.  Silver Sneakers may be a benefit available to you. To determine eligibility, you may visit the website: www.silversneakers.com or contact program at 979 509 5180 Mon-Fri between 8AM-8PM.   . Maintain a healthy weight. The body mass index (BMI) is a screening tool to identify possible weight problems. It provides an estimate of body fat based on height and weight. Your health care provider can find your BMI and can help you achieve or maintain a healthy weight.   For adults 20 years and older: ? A BMI below 18.5 is considered underweight. ? A BMI of 18.5 to 24.9 is normal. ? A BMI of 25 to 29.9 is considered overweight. ? A BMI of 30 and above is considered obese.   . Maintain normal blood lipids and cholesterol levels by exercising and minimizing your intake of saturated fat. Eat a balanced diet with plenty of fruit and vegetables. Blood tests for lipids and cholesterol should begin at age 7 and be repeated every 5 years. If your lipid or cholesterol levels are high, you are over 50, or you are at high risk for heart disease, you may need your cholesterol levels checked more  frequently. Ongoing high lipid and cholesterol levels should be treated with medicines if diet and exercise are not working.  . If you smoke, find out from your health care provider how to quit. If you do not use tobacco, please do not start.  . If you choose to drink alcohol, please do not consume more than 2 drinks per day. One drink is considered to be 12 ounces (355 mL) of beer, 5 ounces (148 mL) of wine, or 1.5 ounces (44 mL) of liquor.  . If you are 52-45 years old, ask your health care provider if you should take aspirin to  prevent strokes.  . Use sunscreen. Apply sunscreen liberally and repeatedly throughout the day. You should seek shade when your shadow is shorter than you. Protect yourself by wearing long sleeves, pants, a wide-brimmed hat, and sunglasses year round, whenever you are outdoors.  . Once a month, do a whole body skin exam, using a mirror to look at the skin on your back. Tell your health care provider of new moles, moles that have irregular borders, moles that are larger than a pencil eraser, or moles that have changed in shape or color.

## 2017-08-17 NOTE — Progress Notes (Signed)
Hearing - failed.  D/w pt.  Declined hearing aids. Mammogram declined for now.  She'll consider.  Discussed rationale and she'll update me as needed.   DXA declined.  D/w pt.  smoking cessation is likely the most important step.   Living will d/w pt.  Daughter Thayer Headings designated if patient were incapacitated.  Colonoscopy declined.   See avs.   Double vision hasn't happened in the last year.  None since prev cataract procedure.  No other neuro sx.  No new changes.  D/w pt about routine cautions.  Still on ASA and plavix and statin intolerant.  Smoking cessation d/w pt.    PVD hx noted.  Pt has swollen feet in the summer. Pt has been using stockings episodically w/o much relief but she has a new pair to use and she'll see how that goes.  Elevation helps but sx return in the AM each day.  Zero cramping in her legs now.  no skin ulcers.  Routine cautions d/w pt.    She is cutting back on tobacco.  She is smoking a few cigs a day, she tried the patches, gum, etc.  D/w pt, encouraged to quit.  She wants to try to quit.  We talked about routine cautions on chantix and she wasn't enthused about that.  Given her use, 3-4 cigs per day, that may not be indicated.  We talked about retrial of the gum.    Hypertension:               Using medication without problems or lightheadedness: yes Chest pain with exertion:no Edema:at baseline, see above Short of breath:no Labs d/w pt.   Statin intolerant.  labs pending.    Hypothyroidism.  No ADE on med.  No neck mass. TSH pending.  Compliant with med.  All d/w pt.    PMH and SH reviewed  ROS: Per HPI unless specifically indicated in ROS section   Meds, vitals, and allergies reviewed.   GEN: nad, alert and oriented HEENT: mucous membranes moist NECK: supple w/o LA CV: rrr. PULM: ctab, no inc wob ABD: soft, +bs EXT: no edema SKIN: no acute rash Faint DP pulses B

## 2017-08-17 NOTE — Progress Notes (Signed)
PCP notes:   Health maintenance:  No gaps identified.  Abnormal screenings:   Hearing - failed  Hearing Screening   125Hz  250Hz  500Hz  1000Hz  2000Hz  3000Hz  4000Hz  6000Hz  8000Hz   Right ear:   40 40 40  0    Left ear:   40 40 40  0     Patient concerns:   None  Nurse concerns:  None  Next PCP appt:   08/17/2017 @ 0945  I reviewed health advisor's note, was available for consultation on the day of service listed in this note, and agree with documentation and plan. Dejanee Stain, MD.

## 2017-08-18 NOTE — Assessment & Plan Note (Signed)
Living will d/w pt.Daughter Janice designated if patient were incapacitated.  

## 2017-08-18 NOTE — Assessment & Plan Note (Signed)
Hearing - failed.  D/w pt.  Declined hearing aids. Mammogram declined for now.  She'll consider.  Discussed rationale and she'll update me as needed.   DXA declined.  D/w pt.  smoking cessation is likely the most important step.   Living will d/w pt.  Daughter Thayer Headings designated if patient were incapacitated.  Colonoscopy declined.

## 2017-08-18 NOTE — Assessment & Plan Note (Signed)
Zero cramping in her legs now.  no skin ulcers.  Routine cautions d/w pt.  D/w pt about smoking cessation.  No change in meds o/w.  See notes on labs.

## 2017-08-18 NOTE — Assessment & Plan Note (Signed)
Double vision hasn't happened in the last year.  None since prev cataract procedure.  No other neuro sx.  No new changes.  D/w pt about routine cautions.  Still on ASA and plavix and statin intolerant.  Smoking cessation d/w pt.

## 2017-08-18 NOTE — Assessment & Plan Note (Signed)
She is cutting back on tobacco.  She is smoking a few cigs a day, she tried the patches, gum, etc.  D/w pt, encouraged to quit.  She wants to try to quit.  We talked about routine cautions on chantix and she wasn't enthused about that.  Given her use, 3-4 cigs per day, that may not be indicated.  We talked about retrial of the gum.

## 2017-08-18 NOTE — Assessment & Plan Note (Signed)
Statin intolerant.  labs pending.  See notes on labs.  No change in meds today.   >25 minutes spent in face to face time with patient, >50% spent in counselling or coordination of care, discussing HTN, thyroid disease, PAD, etc.

## 2017-08-18 NOTE — Assessment & Plan Note (Signed)
No ADE on med.  No neck mass. TSH pending.  Compliant with med.  All d/w pt.  See notes on labs.

## 2017-08-20 ENCOUNTER — Other Ambulatory Visit: Payer: Self-pay | Admitting: Family Medicine

## 2017-08-20 DIAGNOSIS — E038 Other specified hypothyroidism: Secondary | ICD-10-CM

## 2017-09-18 ENCOUNTER — Other Ambulatory Visit: Payer: Self-pay | Admitting: Family Medicine

## 2017-10-18 ENCOUNTER — Ambulatory Visit: Payer: Self-pay | Admitting: *Deleted

## 2017-10-18 DIAGNOSIS — S61401A Unspecified open wound of right hand, initial encounter: Secondary | ICD-10-CM | POA: Diagnosis not present

## 2017-10-18 NOTE — Telephone Encounter (Signed)
Pt reports daughter's puppy jumped on her and "It's toenail tore my left hand up." Occurred Monday. Reports 2 puncture wounds and a skin tear, size of a nickel; all on top of hand.  States this am redness and swelling present. Redness "From knuckles to 1/2 way to elbow", swelling moderate, warm to touch.  States wound "Bled really bad Monday, and one area bled a lot this AM but stopped now" Pt is on Plavix.  Pt states when injury occurred she "Put the skin that was torn back in place and put Vaseline on it." Denies fever. Tetanus up to date. Pt directed to UC as no availability at practice this afternoon. Pt reluctant, asking for appt tomorrow. Reiterated need to be seen this afternoon.  States will follow UC disposition. Care advise given per protocol. Reason for Disposition . [1] Looks infected AND [2] large red area (>2 inches or 5 cm) or streak  Answer Assessment - Initial Assessment Questions 1. APPEARANCE of INJURY: "What does the injury look like?"      2 puncture wounds, 1 skin tear 2. SIZE: "How large is the cut?"      Tear is nickel size 3. BLEEDING: "Is it bleeding now?" If so, ask: "Is it difficult to stop?"      Bled "Bad" Monday, dried now; One puncture wound bled this am, "Hard to stop but stopped now." On Plavix 4. LOCATION: "Where is the injury located?"      Left hand, top of hand. 5. ONSET: "How long ago did the injury occur?"      Monday 6. MECHANISM: "Tell me how it happened."      Daughter's puppy jumped up on her, toenail "Tore hand" 7. TETANUS: "When was the last tetanus booster?"     Due 2022  Protocols used: [place]

## 2017-10-18 NOTE — Telephone Encounter (Signed)
Agree with urgent care disposition as I am not even at clinic this morning.  Thanks.

## 2017-10-22 DIAGNOSIS — R29810 Facial weakness: Secondary | ICD-10-CM

## 2017-10-22 HISTORY — DX: Facial weakness: R29.810

## 2017-10-31 ENCOUNTER — Other Ambulatory Visit: Payer: Self-pay | Admitting: Family Medicine

## 2017-11-09 ENCOUNTER — Other Ambulatory Visit: Payer: Self-pay

## 2017-11-09 ENCOUNTER — Encounter: Payer: Self-pay | Admitting: Family

## 2017-11-09 ENCOUNTER — Ambulatory Visit: Payer: Medicare Other | Admitting: Family

## 2017-11-09 ENCOUNTER — Ambulatory Visit (INDEPENDENT_AMBULATORY_CARE_PROVIDER_SITE_OTHER)
Admission: RE | Admit: 2017-11-09 | Discharge: 2017-11-09 | Disposition: A | Discharge status: Home / Self Care | Payer: Medicare Other | Source: Ambulatory Visit | Attending: Vascular Surgery | Admitting: Vascular Surgery

## 2017-11-09 ENCOUNTER — Ambulatory Visit (HOSPITAL_COMMUNITY)
Admission: RE | Admit: 2017-11-09 | Discharge: 2017-11-09 | Disposition: A | Discharge status: Home / Self Care | Payer: Medicare Other | Source: Ambulatory Visit | Attending: Vascular Surgery | Admitting: Vascular Surgery

## 2017-11-09 VITALS — BP 215/98 | HR 58 | Temp 98.0°F | Resp 18 | Ht 64.5 in | Wt 109.0 lb

## 2017-11-09 DIAGNOSIS — F17200 Nicotine dependence, unspecified, uncomplicated: Secondary | ICD-10-CM | POA: Diagnosis present

## 2017-11-09 DIAGNOSIS — I779 Disorder of arteries and arterioles, unspecified: Secondary | ICD-10-CM

## 2017-11-09 DIAGNOSIS — I6523 Occlusion and stenosis of bilateral carotid arteries: Secondary | ICD-10-CM | POA: Insufficient documentation

## 2017-11-09 NOTE — Progress Notes (Signed)
[facility] [place] HISTORY AND PHYSICAL   CC: Follow up peripheral artery occlusive disease and extracranial carotid artery stenosis     History of Present Illness:   Jill Franco is a 83 y.o. female who underwent left common iliac and aortic stenting 03/18/2016 by Dr. Oneida Alar. This was done for claudication symptoms as well as a small ulceration on the right lateral malleolus. She reports her claudication symptoms have completely resolved. The ulcer on her right leg is also healed. She has known bilateral superficial femoral artery occlusions but currently does not really have symptoms from these. She is able to walk as much as she wishes without problems in her legs at this point. She denies rest pain. She is on aspirin and Plavix. Unfortunately she continues to smoke.  Dr. Oneida Alar last evaluated pt on 06-23-16. At that time she had no palpable popliteal or pedal pulses but she did have 2+ femoral pulses.  ABIs from March 15 were 0.86 on the left 0.5 on the right Peripheral arterial disease was asymptomatic. The patient was to follow-up in 6 months time with our nurse practitioners with repeat ABIs. She also needs a carotid duplex at that time for evaluation of carotid bruit. She had no neurologic symptoms.  Her blood pressure at her PCP's office on 08-17-17 was 118/84 (review of records), is elevated now.   Pt Diabetic: No Pt smoker: smoker  (1/3 ppd, started at age 67 yrs)  Pt meds include: Statin :No, myalgia reaction to Lipitor Betablocker: Yes ASA: Yes Other anticoagulants/antiplatelets: Plavix     Current Outpatient Medications  Medication Sig Dispense Refill  . aspirin 81 MG EC tablet Take 81 mg by mouth 2 (two) times daily.     . clopidogrel (PLAVIX) 75 MG tablet Take 1 tablet (75 mg total) by mouth daily. 90 tablet 3  . enalapril (VASOTEC) 10 MG tablet TAKE 1/2 TO 1 TABLET BY  MOUTH DAILY 90 tablet 1  . hydrochlorothiazide (MICROZIDE) 12.5 MG  capsule TAKE 1 CAPSULE BY MOUTH  EVERY MORNING 90 capsule 1  . levothyroxine (SYNTHROID, LEVOTHROID) 50 MCG tablet TAKE 1 TABLET BY MOUTH  DAILY 90 tablet 1  . metoprolol tartrate (LOPRESSOR) 100 MG tablet TAKE 1/2 TABLET BY MOUTH IN THE MORNING AND 1 TABLET IN THE EVENING 135 tablet 1   No current facility-administered medications for this visit.     Past Medical History:  Diagnosis Date  . Cataract   . History of shingles 2012  . HLD (hyperlipidemia)   . HTN (hypertension)   . Hypothyroidism   . PVD (peripheral vascular disease) ([place])     Social History Social History   Tobacco Use  . Smoking status: Current Every Day Smoker    Packs/day: 0.33    Years: 62.00    Pack years: 20.46    Types: Cigarettes  . Smokeless tobacco: Never Used  . Tobacco comment: 4-5 cigarettes per day  Substance Use Topics  . Alcohol use: No    Alcohol/week: 0.0 standard drinks  . Drug use: No    Family History Family History  Problem Relation Age of Onset  . Stroke Father 69  . Cancer Brother   . Colon cancer Neg Hx   . Breast cancer Neg Hx     Surgical History Past Surgical History:  Procedure Laterality Date  . ABDOMINAL EXPLORATION SURGERY  1950s   "they thought I was pregnant; went in to explore"  . APPENDECTOMY  childhood  . BACK  SURGERY    . BREAST LUMPECTOMY Left 1960's   benign  . carotid ultrasound  06/19/2007   0-39% stenosis  . CATARACT EXTRACTION W/ INTRAOCULAR LENS IMPLANT Left 09/2016  . CATARACT EXTRACTION W/ INTRAOCULAR LENS IMPLANT Right 10/2016  . DILATION AND CURETTAGE OF UTERUS  1960's X 3   miscarriages  . DOPPLER ECHOCARDIOGRAPHY  06/19/2007   Normal EF 55-70%  . ILIAC ARTERY STENT Left 03/18/2016   common iliac stent (6 x 40), aortic stent (10 x 19)/notes 03/18/2016  . LAMINECTOMY AND MICRODISCECTOMY SPINE  1960's X 2  . MOLE REMOVAL  07/10/2016  . NSVD     x1  . PERIPHERAL VASCULAR CATHETERIZATION N/A 03/18/2016   Procedure: Abdominal Aortogram w/Lower  Extremity;  Surgeon: Elam Dutch, MD;  Location: [facility];  Service: Cardiovascular;  Laterality: N/A;  . PERIPHERAL VASCULAR CATHETERIZATION  03/18/2016   Procedure: Peripheral Vascular Intervention;  Surgeon: Elam Dutch, MD;  Location: [facility];  Service: Cardiovascular;;    Allergies  Allergen Reactions  . Aspirin Other (See Comments)    REACTION: Uncoated Stomach upset on empty stomach  . Codeine Other (See Comments)    REACTION: UNSPECIFIED  . Lipitor [Atorvastatin] Other (See Comments)    myalgia    Current Outpatient Medications  Medication Sig Dispense Refill  . aspirin 81 MG EC tablet Take 81 mg by mouth 2 (two) times daily.     . clopidogrel (PLAVIX) 75 MG tablet Take 1 tablet (75 mg total) by mouth daily. 90 tablet 3  . enalapril (VASOTEC) 10 MG tablet TAKE 1/2 TO 1 TABLET BY  MOUTH DAILY 90 tablet 1  . hydrochlorothiazide (MICROZIDE) 12.5 MG capsule TAKE 1 CAPSULE BY MOUTH  EVERY MORNING 90 capsule 1  . levothyroxine (SYNTHROID, LEVOTHROID) 50 MCG tablet TAKE 1 TABLET BY MOUTH  DAILY 90 tablet 1  . metoprolol tartrate (LOPRESSOR) 100 MG tablet TAKE 1/2 TABLET BY MOUTH IN THE MORNING AND 1 TABLET IN THE EVENING 135 tablet 1   No current facility-administered medications for this visit.      REVIEW OF SYSTEMS: See HPI for pertinent positives and negatives.  Physical Examination Vitals:   11/09/17 1401 11/09/17 1403 11/09/17 1404  BP: (!) 203/99 (!) 193/93 (!) 215/98  Pulse: (!) 58    Resp: 18    Temp: 98 F (36.7 C)    TempSrc: Oral    SpO2: 100%    Weight: 109 lb (49.4 kg)    Height: 5' 4.5" (1.638 m)     Body mass index is 18.42 kg/m.  General:  WD thin female in NAD Gait: Normal HENT: no gross abnormalities  Eyes: PERRLA Pulmonary: normal non-labored breathing, fair air movement in all fields, CTAB, no rales, rhonchi, or wheezing Cardiac: Regular rhythm, bradycardic (on a beta blocker), no murmur detected Abdomen: soft,  NT, scaphoid, no masses palpated Skin: no rashes, no ulcers, no cellulitis.   VASCULAR EXAM  Carotid Bruits Right Left   Negative Negative      Radial pulses are 1+ palpable bilaterally   Adominal aortic pulse is not palpable                      VASCULAR EXAM: Extremities without ischemic changes, without Gangrene; without open wounds.Feet are cool to touch.  LE Pulses Right Left       FEMORAL  2+ palpable  2+ palpable        POPLITEAL  not palpable   faintly palpable       POSTERIOR TIBIAL  not palpable   2+ palpable        DORSALIS PEDIS      ANTERIOR TIBIAL not palpable  not palpable     Musculoskeletal: no muscle wasting or atrophy; no peripheral edema        Neurologic:  A&O X 3; appropriate affect, sensation is normal; speech is normal, CN 2-12 intact, pain and light touch intact in extremities, motor exam as listed above Psychiatric: Normal thought content, mood appropriate to clinical situation.    ASSESSMENT:  Jill Franco is a 83 y.o. female who is s/p  left common iliac and aortic stenting 03/18/2016.  She has known bilateral superficial femoral artery occlusions.  She has no claudication symptoms with walking, has no signs of ischemia in her feet.  She has no history of stroke or TIA.   She does not have DM but continues to smoke.   Blood pressure at her PCP office in June 2019 was 118/84, is elevated now; hopefully this is transient, she is asymptomatic.  She was also hypertensive at her visit on 01-05-17 with a normal blood pressure at her PCP's office a couple of months prior.   DATA  Carotid Duplex (11-09-17): Right ICA: 1-39% stenosis. Left ICA: 40-59% stenosis. Left ECA stenosis. Bilateral vertebral artery flow is antegrade.  Bilateral subclavian artery waveforms are biphasic.   No significant change from the exams on 08-22-16 and 01-05-17.   ABI (Date: 11/09/2017):  R:   ABI: 0.71 (was 0.70 on 01-05-17),   PT: mono dampened (was mono)  DP: mono dampened (was mono)  TBI:  0.46, tOe pressure 91 (was 0.65)  L:   ABI: 0.82 (was 0.87),   PT: bi (was mono)  DP: bi (was mono)  TBI: 0.53, toe pressure 106 (was 0.72) Stable bilateral ABI with moderate disease in the right, mild in the left. Dampened monophasic waveforms in the right, biphasic waveforms in the left, improved from mono.   PLAN:   The patient was again counseled re smoking cessation and given several free resources re smoking cessation.  Walk at least 30 minutes daily in a safe environment.   Based on today's exam and non-invasive vascular lab results, the patient will follow up in 1 year carotid duplex and ABI's.   I discussed in depth with the patient the nature of atherosclerosis, and emphasized the importance of maximal medical management including strict control of blood pressure, blood glucose, and lipid levels, obtaining regular exercise, and cessation of smoking.  The patient is aware that without maximal medical management the underlying atherosclerotic disease process will progress, limiting the benefit of any interventions.  The patient was given information about stroke prevention and what symptoms should prompt the patient to seek immediate medical care.  The patient was given information about PAD including signs, symptoms, treatment, what symptoms should prompt the patient to seek immediate medical care, and risk reduction measures to take.  Thank you for allowing [place] to participate in this patient's care.  Clemon Chambers, RN, MSN, FNP-C [facility] Office: [phone]  Clinic MD: [facility] 11/09/2017 2:47 PM

## 2017-11-09 NOTE — Patient Instructions (Signed)
Steps to Quit Smoking Smoking tobacco can be bad for your health. It can also affect almost every organ in your body. Smoking puts you and people around you at risk for many serious long-lasting (chronic) diseases. Quitting smoking is hard, but it is one of the best things that you can do for your health. It is never too late to quit. What are the benefits of quitting smoking? When you quit smoking, you lower your risk for getting serious diseases and conditions. They can include:  Lung cancer or lung disease.  Heart disease.  Stroke.  Heart attack.  Not being able to have children (infertility).  Weak bones (osteoporosis) and broken bones (fractures).  If you have coughing, wheezing, and shortness of breath, those symptoms may get better when you quit. You may also get sick less often. If you are pregnant, quitting smoking can help to lower your chances of having a baby of low birth weight. What can I do to help me quit smoking? Talk with your doctor about what can help you quit smoking. Some things you can do (strategies) include:  Quitting smoking totally, instead of slowly cutting back how much you smoke over a period of time.  Going to in-person counseling. You are more likely to quit if you go to many counseling sessions.  Using resources and support systems, such as: ? Online chats with a counselor. ? Phone quitlines. ? Printed self-help materials. ? Support groups or group counseling. ? Text messaging programs. ? Mobile phone apps or applications.  Taking medicines. Some of these medicines may have nicotine in them. If you are pregnant or breastfeeding, do not take any medicines to quit smoking unless your doctor says it is okay. Talk with your doctor about counseling or other things that can help you.  Talk with your doctor about using more than one strategy at the same time, such as taking medicines while you are also going to in-person counseling. This can help make  quitting easier. What things can I do to make it easier to quit? Quitting smoking might feel very hard at first, but there is a lot that you can do to make it easier. Take these steps:  Talk to your family and friends. Ask them to support and encourage you.  Call phone quitlines, reach out to support groups, or work with a counselor.  Ask people who smoke to not smoke around you.  Avoid places that make you want (trigger) to smoke, such as: ? Bars. ? Parties. ? Smoke-break areas at work.  Spend time with people who do not smoke.  Lower the stress in your life. Stress can make you want to smoke. Try these things to help your stress: ? Getting regular exercise. ? Deep-breathing exercises. ? Yoga. ? Meditating. ? Doing a body scan. To do this, close your eyes, focus on one area of your body at a time from head to toe, and notice which parts of your body are tense. Try to relax the muscles in those areas.  Download or buy apps on your mobile phone or tablet that can help you stick to your quit plan. There are many free apps, such as QuitGuide from the CDC (Centers for Disease Control and Prevention). You can find more support from smokefree.gov and other websites.  This information is not intended to replace advice given to you by your health care provider. Make sure you discuss any questions you have with your health care provider. Document Released: 12/04/2008 Document   Revised: 10/06/2015 Document Reviewed: 06/24/2014 Elsevier Interactive Patient Education  2018 Elsevier Inc.   Stroke Prevention Some health problems and behaviors may make it more likely for you to have a stroke. Below are ways to lessen your risk of having a stroke.  Be active for at least 30 minutes on most or all days.  Do not smoke. Try not to be around others who smoke.  Do not drink too much alcohol. ? Do not have more than 2 drinks a day if you are a man. ? Do not have more than 1 drink a day if you are a  woman and are not pregnant.  Eat healthy foods, such as fruits and vegetables. If you were put on a specific diet, follow the diet as told.  Keep your cholesterol levels under control through diet and medicines. Look for foods that are low in saturated fat, trans fat, cholesterol, and are high in fiber.  If you have diabetes, follow all diet plans and take your medicine as told.  Ask your doctor if you need treatment to lower your blood pressure. If you have high blood pressure (hypertension), follow all diet plans and take your medicine as told by your doctor.  If you are 18-39 years old, have your blood pressure checked every 3-5 years. If you are age 40 or older, have your blood pressure checked every year.  Keep a healthy weight. Eat foods that are low in calories, salt, saturated fat, trans fat, and cholesterol.  Do not take drugs.  Avoid birth control pills, if this applies. Talk to your doctor about the risks of taking birth control pills.  Talk to your doctor if you have sleep problems (sleep apnea).  Take all medicine as told by your doctor. ? You may be told to take aspirin or blood thinner medicine. Take this medicine as told by your doctor. ? Understand your medicine instructions.  Make sure any other conditions you have are being taken care of.  Get help right away if:  You suddenly lose feeling (you feel numb) or have weakness in your face, arm, or leg.  Your face or eyelid hangs down to one side.  You suddenly feel confused.  You have trouble talking (aphasia) or understanding what people are saying.  You suddenly have trouble seeing in one or both eyes.  You suddenly have trouble walking.  You are dizzy.  You lose your balance or your movements are clumsy (uncoordinated).  You suddenly have a very bad headache and you do not know the cause.  You have new chest pain.  Your heart feels like it is fluttering or skipping a beat (irregular heartbeat). Do  not wait to see if the symptoms above go away. Get help right away. Call your local emergency services (911 in [place] Do not drive yourself to the hospital. This information is not intended to replace advice given to you by your health care provider. Make sure you discuss any questions you have with your health care provider. Document Released: 08/09/2011 Document Revised: 07/16/2015 Document Reviewed: 08/10/2012 Elsevier Interactive Patient Education  2018 Elsevier Inc.     Peripheral Vascular Disease Peripheral vascular disease (PVD) is a disease of the blood vessels that are not part of your heart and brain. A simple term for PVD is poor circulation. In most cases, PVD narrows the blood vessels that carry blood from your heart to the rest of your body. This can result in a decreased supply of blood to   your arms, legs, and internal organs, like your stomach or kidneys. However, it most often affects a person's lower legs and feet. There are two types of PVD.  Organic PVD. This is the more common type. It is caused by damage to the structure of blood vessels.  Functional PVD. This is caused by conditions that make blood vessels contract and tighten (spasm).  Without treatment, PVD tends to get worse over time. PVD can also lead to acute ischemic limb. This is when an arm or limb suddenly has trouble getting enough blood. This is a medical emergency. Follow these instructions at home:  Take medicines only as told by your doctor.  Do not use any tobacco products, including cigarettes, chewing tobacco, or electronic cigarettes. If you need help quitting, ask your doctor.  Lose weight if you are overweight, and maintain a healthy weight as told by your doctor.  Eat a diet that is low in fat and cholesterol. If you need help, ask your doctor.  Exercise regularly. Ask your doctor for some good activities for you.  Take good care of your feet. ? Wear comfortable shoes that fit  well. ? Check your feet often for any cuts or sores. Contact a doctor if:  You have cramps in your legs while walking.  You have leg pain when you are at rest.  You have coldness in a leg or foot.  Your skin changes.  You are unable to get or have an erection (erectile dysfunction).  You have cuts or sores on your feet that are not healing. Get help right away if:  Your arm or leg turns cold and blue.  Your arms or legs become red, warm, swollen, painful, or numb.  You have chest pain or trouble breathing.  You suddenly have weakness in your face, arm, or leg.  You become very confused or you cannot speak.  You suddenly have a very bad headache.  You suddenly cannot see. This information is not intended to replace advice given to you by your health care provider. Make sure you discuss any questions you have with your health care provider. Document Released: 05/04/2009 Document Revised: 07/16/2015 Document Reviewed: 07/18/2013 Elsevier Interactive Patient Education  2017 Elsevier Inc.  

## 2017-11-12 ENCOUNTER — Telehealth: Payer: Self-pay | Admitting: Family Medicine

## 2017-11-12 NOTE — Telephone Encounter (Signed)
Call pt.  BP was elevated at vascular clinic.  Have her check BP at home and update me is consistently >140/>90.  Thanks.

## 2017-11-13 NOTE — Telephone Encounter (Signed)
Patient notified as instructed by telephone and verbalized understanding. Patient checked her blood pressure while on the phone with me and it was 174/98.

## 2017-11-14 ENCOUNTER — Other Ambulatory Visit: Payer: Self-pay

## 2017-11-14 ENCOUNTER — Encounter (HOSPITAL_COMMUNITY): Payer: Self-pay

## 2017-11-14 ENCOUNTER — Emergency Department (HOSPITAL_COMMUNITY)
Admission: EM | Admit: 2017-11-14 | Discharge: 2017-11-14 | Disposition: A | Discharge status: Home / Self Care | Payer: Medicare Other | Attending: Emergency Medicine | Admitting: Emergency Medicine

## 2017-11-14 DIAGNOSIS — H5462 Unqualified visual loss, left eye, normal vision right eye: Secondary | ICD-10-CM

## 2017-11-14 DIAGNOSIS — H547 Unspecified visual loss: Secondary | ICD-10-CM | POA: Insufficient documentation

## 2017-11-14 DIAGNOSIS — I10 Essential (primary) hypertension: Secondary | ICD-10-CM | POA: Diagnosis not present

## 2017-11-14 DIAGNOSIS — E039 Hypothyroidism, unspecified: Secondary | ICD-10-CM | POA: Insufficient documentation

## 2017-11-14 DIAGNOSIS — F17210 Nicotine dependence, cigarettes, uncomplicated: Secondary | ICD-10-CM | POA: Diagnosis not present

## 2017-11-14 DIAGNOSIS — H5460 Unqualified visual loss, one eye, unspecified: Secondary | ICD-10-CM | POA: Diagnosis not present

## 2017-11-14 DIAGNOSIS — Z79899 Other long term (current) drug therapy: Secondary | ICD-10-CM | POA: Insufficient documentation

## 2017-11-14 DIAGNOSIS — Z7982 Long term (current) use of aspirin: Secondary | ICD-10-CM | POA: Insufficient documentation

## 2017-11-14 MED ORDER — FLUORESCEIN SODIUM 1 MG OP STRP
1.0000 | ORAL_STRIP | Freq: Once | OPHTHALMIC | Status: AC
  Administered 2017-11-14: 1 via OPHTHALMIC
  Filled 2017-11-14: qty 1

## 2017-11-14 MED ORDER — TETRACAINE HCL 0.5 % OP SOLN
2.0000 [drp] | Freq: Once | OPHTHALMIC | Status: AC
  Administered 2017-11-14: 2 [drp] via OPHTHALMIC
  Filled 2017-11-14: qty 4

## 2017-11-14 NOTE — ED Triage Notes (Signed)
Pt states she woke up this morning with vision loss in her left eye. Pt states she can see as long as she is seated, when standing she sees a brown spot and blocks. Pt is alert and oriented.

## 2017-11-14 NOTE — Telephone Encounter (Signed)
Called and gave information to triage nurse at [facility] ER and advised her that patient is on her way there.

## 2017-11-14 NOTE — ED Notes (Signed)
Patient verbalizes understanding of discharge instructions. Opportunity for questioning and answers were provided. Ambulatory at discharge in NAD.  

## 2017-11-14 NOTE — Telephone Encounter (Signed)
Patient notified as instructed by telephone and verbalized understanding. Patient stated that she just got off of the phone with her friend and she told her she would come over and take her wherever she needs to go. Patient stated that she will call her now and have her come over and take her to [facility].   I told patient I would call her back shortly and make sure she has made arrangements to go to the ER.

## 2017-11-14 NOTE — Telephone Encounter (Signed)
Patient notified as instructed and verbalized understanding. Patient stated that the directions on the Enalapril shows 1/2-1 daily. Patient stated that she has been taking one a day, but when she takes one her blood pressure goes wild, up and down and her feet swell. Patient stated that she is having a problem with the vision in her left eye. Patient stated that when she gets up walking around she can not see out of her left eye, but when she sits down for a while she can see out of it. Patient stated that she had a headache yesterday morning and this morning, but it is gone now. Patient stated that her blood pressure this morning was 184/77. Patient stated that she sees an eye doctor and she was told that she has spasms behind her left eye that causes her to see squiggly lines, but has not had this problem before. Patient stated that she just got a reminder letter that is is time for her annual checkup with her eye doctor and plans on calling to schedule an appointment.

## 2017-11-14 NOTE — ED Provider Notes (Signed)
[place] EMERGENCY DEPARTMENT Provider Note   CSN: [id number] Arrival date & time: 11/14/17  1047     History   Chief Complaint Chief Complaint  Patient presents with  . Loss of Vision    HPI Jill Franco is a 83 y.o. female.  83 yo F with a cc of unilateral loss of vision.  This only occurs in the patient stands and she is able to see but feels that it is obscured by floating black wavy lines and dots.  When she sits this clears and she is able to see as she normally can.  She denies trauma.  Has had a right-sided headache that seems to come and go.  She noticed this when she woke up this morning.  She denies unilateral numbness or weakness denies difficulty with speech or swallowing.  She denies chest pain or shortness of breath.  The history is provided by the patient and a friend.  Illness  This is a new problem. The current episode started yesterday. The problem occurs constantly. The problem has not changed since onset.Pertinent negatives include no chest pain, no headaches and no shortness of breath. Nothing aggravates the symptoms. Nothing relieves the symptoms. She has tried nothing for the symptoms. The treatment provided no relief.    Past Medical History:  Diagnosis Date  . Cataract   . History of shingles 2012  . HLD (hyperlipidemia)   . HTN (hypertension)   . Hypothyroidism   . PVD (peripheral vascular disease) [facility])     Patient Active Problem List   Diagnosis Date Noted  . Double vision 08/16/2016  . PAD (peripheral artery disease) ([place]) 03/18/2016  . Peripheral vascular disease ([place]) 01/11/2016  . Back pain 01/03/2016  . Claudication ([place]) 01/03/2016  . Paresthesia 01/03/2016  . Hair loss 10/01/2015  . Colon cancer screening 08/06/2015  . Allergic rhinitis 03/20/2015  . Advance care planning 08/04/2014  . Smoking 08/04/2014  . Healthcare maintenance 06/07/2011  . Herpes zoster 03/19/2010  . Hypothyroidism 08/23/2006  . HLD  (hyperlipidemia) 08/23/2006  . HYPERTENSION, BENIGN ESSENTIAL 07/18/2006    Past Surgical History:  Procedure Laterality Date  . ABDOMINAL EXPLORATION SURGERY  1950s   "they thought I was pregnant; went in to explore"  . APPENDECTOMY  childhood  . BACK SURGERY    . BREAST LUMPECTOMY Left 1960's   benign  . carotid ultrasound  06/19/2007   0-39% stenosis  . CATARACT EXTRACTION W/ INTRAOCULAR LENS IMPLANT Left 09/2016  . CATARACT EXTRACTION W/ INTRAOCULAR LENS IMPLANT Right 10/2016  . DILATION AND CURETTAGE OF UTERUS  1960's X 3   miscarriages  . DOPPLER ECHOCARDIOGRAPHY  06/19/2007   Normal EF 55-70%  . ILIAC ARTERY STENT Left 03/18/2016   common iliac stent (6 x 40), aortic stent (10 x 19)/notes 03/18/2016  . LAMINECTOMY AND MICRODISCECTOMY SPINE  1960's X 2  . MOLE REMOVAL  07/10/2016  . NSVD     x1  . PERIPHERAL VASCULAR CATHETERIZATION N/A 03/18/2016   Procedure: Abdominal Aortogram w/Lower Extremity;  Surgeon: Elam Dutch, MD;  Location: [place] CV LAB;  Service: Cardiovascular;  Laterality: N/A;  . PERIPHERAL VASCULAR CATHETERIZATION  03/18/2016   Procedure: Peripheral Vascular Intervention;  Surgeon: Elam Dutch, MD;  Location: [place] CV LAB;  Service: Cardiovascular;;     OB History   None      Home Medications    Prior to Admission medications   Medication Sig Start Date End Date Taking?  Authorizing Provider  aspirin 81 MG EC tablet Take 81 mg by mouth 2 (two) times daily.    Yes [provider]  cetirizine (ZYRTEC) 10 MG tablet Take 10 mg by mouth as needed for allergies.   Yes [provider]  clopidogrel (PLAVIX) 75 MG tablet Take 1 tablet (75 mg total) by mouth daily. 08/17/17  Yes Tonia Ghent, MD  enalapril (VASOTEC) 10 MG tablet TAKE 1/2 TO 1 TABLET BY  MOUTH DAILY 09/18/17  Yes Tonia Ghent, MD  hydrochlorothiazide (MICROZIDE) 12.5 MG capsule TAKE 1 CAPSULE BY MOUTH  EVERY MORNING 10/31/17  Yes Tonia Ghent, MD    levothyroxine (SYNTHROID, LEVOTHROID) 50 MCG tablet TAKE 1 TABLET BY MOUTH  DAILY 10/31/17  Yes Tonia Ghent, MD  Liniments (BLUE-EMU SUPER STRENGTH EX) Apply 1 application topically as needed (knee pain).   Yes [provider]  metoprolol tartrate (LOPRESSOR) 100 MG tablet TAKE 1/2 TABLET BY MOUTH IN THE MORNING AND 1 TABLET IN THE EVENING Patient taking differently: Take 50-100 mg by mouth See admin instructions. TAKE 1/2 TABLET BY MOUTH IN THE MORNING AND 1 TABLET IN THE EVENING 10/31/17  Yes Tonia Ghent, MD    Family History Family History  Problem Relation Age of Onset  . Stroke Father 43  . Cancer Brother   . Colon cancer Neg Hx   . Breast cancer Neg Hx     Social History Social History   Tobacco Use  . Smoking status: Current Every Day Smoker    Packs/day: 0.33    Years: 62.00    Pack years: 20.46    Types: Cigarettes  . Smokeless tobacco: Never Used  . Tobacco comment: 4-5 cigarettes per day  Substance Use Topics  . Alcohol use: No    Alcohol/week: 0.0 standard drinks  . Drug use: No     Allergies   Aspirin; Codeine; and Lipitor [atorvastatin]   Review of Systems Review of Systems  Constitutional: Negative for chills and fever.  HENT: Negative for congestion and rhinorrhea.   Eyes: Positive for visual disturbance. Negative for redness.  Respiratory: Negative for shortness of breath and wheezing.   Cardiovascular: Negative for chest pain and palpitations.  Gastrointestinal: Negative for nausea and vomiting.  Genitourinary: Negative for dysuria and urgency.  Musculoskeletal: Negative for arthralgias and myalgias.  Skin: Negative for pallor and wound.  Neurological: Negative for dizziness and headaches.     Physical Exam Updated Vital Signs BP (!) 158/89   Pulse (!) 54   Temp 97.7 F (36.5 C) (Oral)   Resp 16   Ht 5\' 4"  (1.626 m)   Wt 49 kg   SpO2 93%   BMI 18.54 kg/m   Physical Exam  Constitutional: She is oriented to person,  place, and time. She appears well-developed and well-nourished. No distress.  HENT:  Head: Normocephalic and atraumatic.  Eyes: Pupils are equal, round, and reactive to light. EOM are normal. Left conjunctiva is not injected. Left conjunctiva has no hemorrhage. Left eye exhibits normal extraocular motion and no nystagmus.  Fundoscopic exam:      The left eye shows no AV nicking and no papilledema.  Left eye pressure 13  Neck: Normal range of motion. Neck supple.  Cardiovascular: Normal rate and regular rhythm. Exam reveals no gallop and no friction rub.  No murmur heard. Pulmonary/Chest: Effort normal. She has no wheezes. She has no rales.  Abdominal: Soft. She exhibits no distension and no mass. There is no  tenderness. There is no guarding.  Musculoskeletal: She exhibits no edema or tenderness.  Neurological: She is alert and oriented to person, place, and time. She has normal strength. A cranial nerve deficit is present. No sensory deficit. She displays a negative Romberg sign. Coordination and gait normal. GCS eye subscore is 4. GCS verbal subscore is 5. GCS motor subscore is 6. She displays no Babinski's sign on the right side. She displays no Babinski's sign on the left side.  Left-sided facial droop.  Chronic per patient and friend.  Skin: Skin is warm and dry. She is not diaphoretic.  Psychiatric: She has a normal mood and affect. Her behavior is normal.  Nursing note and vitals reviewed.    ED Treatments / Results  Labs (all labs ordered are listed, but only abnormal results are displayed) Labs Reviewed - No data to display  EKG None  Radiology No results found.  Procedures Procedures (including critical care time)  Medications Ordered in ED Medications  fluorescein ophthalmic strip 1 strip (has no administration in time range)  tetracaine (PONTOCAINE) 0.5 % ophthalmic solution 2 drop (has no administration in time range)     Initial Impression / Assessment and Plan  / ED Course  I have reviewed the triage vital signs and the nursing notes.  Pertinent labs & imaging results that were available during my care of the patient were reviewed by me and considered in my medical decision making (see chart for details).     83 yo F with a cc of left sided visual loss.  This seems to be localized just to the eye.  I do not feel that she is had a stroke.  Her neuro exam is otherwise benign with the exception of left-sided facial droop which a friend thinks is likely old.  The pressure in that eye is normal.  Pupils are equal round and reactive.  Extraocular motor is intact.  She has an eye doctor and will call him as soon as she leaves here.  2:15 PM:  I have discussed the diagnosis/risks/treatment options with the patient and family and believe the pt to be eligible for discharge home to follow-up with Ophtho. We also discussed returning to the ED immediately if new or worsening sx occur. We discussed the sx which are most concerning (e.g., stroke s/sx) that necessitate immediate return. Medications administered to the patient during their visit and any new prescriptions provided to the patient are listed below.  Medications given during this visit Medications  fluorescein ophthalmic strip 1 strip (has no administration in time range)  tetracaine (PONTOCAINE) 0.5 % ophthalmic solution 2 drop (has no administration in time range)      The patient appears reasonably screen and/or stabilized for discharge and I doubt any other medical condition or other [facility] requiring further screening, evaluation, or treatment in the ED at this time prior to discharge.    Final Clinical Impressions(s) / ED Diagnoses   Final diagnoses:  Vision loss of left eye    ED Discharge Orders    None       Deno Etienne, DO 11/14/17 1415

## 2017-11-14 NOTE — Discharge Instructions (Signed)
Please call your ophthalmologist this afternoon.  See if they want to see you in the office today or if they are okay with seeing you in the morning tomorrow.

## 2017-11-14 NOTE — Telephone Encounter (Signed)
Please verify her current enalapril dose.  Would increase total daily dose by 5 mg.  If she is taking 5 mg, increase to 10 mg a day.  If she is taking 10 mg, increase to 15 mg a day.  Update me about her blood pressure in about 1 week.  Please let me know about her new dose so I can update the EMR.  Thanks.

## 2017-11-14 NOTE — Telephone Encounter (Signed)
If she has transient unilateral vision loss we are obligated to tell her to go to ER for eval, especially given her BP changes.  I would encourage that with f/u here thereafter.  Thanks.

## 2017-11-15 DIAGNOSIS — G43B0 Ophthalmoplegic migraine, not intractable: Secondary | ICD-10-CM | POA: Diagnosis not present

## 2017-11-15 DIAGNOSIS — H524 Presbyopia: Secondary | ICD-10-CM | POA: Diagnosis not present

## 2017-11-15 DIAGNOSIS — Z961 Presence of intraocular lens: Secondary | ICD-10-CM | POA: Diagnosis not present

## 2017-11-21 ENCOUNTER — Ambulatory Visit (INDEPENDENT_AMBULATORY_CARE_PROVIDER_SITE_OTHER): Payer: Medicare Other | Admitting: Family Medicine

## 2017-11-21 ENCOUNTER — Encounter: Payer: Self-pay | Admitting: Family Medicine

## 2017-11-21 VITALS — BP 162/88 | HR 68 | Temp 97.7°F | Ht 64.0 in | Wt 108.2 lb

## 2017-11-21 DIAGNOSIS — Z23 Encounter for immunization: Secondary | ICD-10-CM | POA: Diagnosis not present

## 2017-11-21 DIAGNOSIS — R29810 Facial weakness: Secondary | ICD-10-CM

## 2017-11-21 DIAGNOSIS — H547 Unspecified visual loss: Secondary | ICD-10-CM

## 2017-11-21 MED ORDER — METOPROLOL TARTRATE 100 MG PO TABS: ORAL_TABLET | ORAL | Status: DC

## 2017-11-21 MED ORDER — ENALAPRIL MALEATE 10 MG PO TABS: 15.0000 mg | ORAL_TABLET | Freq: Every day | ORAL | Status: DC

## 2017-11-21 NOTE — Patient Instructions (Signed)
See Rosaria Ferries or Anastasiya on the way out to set up your MRI.  If you have any new symptoms, then go back to the ER.  I'll update the vascular clinic.  Please work on stopping smoking.  Take care.  Glad to see you.

## 2017-11-21 NOTE — Progress Notes (Signed)
H/o episodic vision changes with squiggly lines for years.  She has been seen about eye clinic prev and was told by eye clinic "it was due to eye spasms, behind my eye."   That was in L eye only, no vision loss.  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    The other sx are new.  She had new episodic vision loss only in the L eye.  No vision loss in R eye.  It would last for about 15-20 minutes and then gradually get better.  She noted it would get better with sitting.  Worse with standing.  Likely more than 5 episodes.  She had no double vision with these episodes.  She was seen at the eye clinic last week- [facility], she was told recent sx were from migraines.    ER notes d/w pt.    Here today for eval.  BP on her cuff and our cuff are similar.  She is taking 15mg  enalapril a day.  Still on baseline meds o/w.    She has occ R sided HA for 2-3 minutes but that can happen w/o eye sx.  HA isn't severe.    Smoking, not yet quit.  ~1/2 PPD.  She is considering quitting.  She has tried gum prev.  D/w pt.    Statin intolerant.   She was able to do all her housework recently w/o any troubles.  Last episode was last week.  No sx currently or since.   ========================================  Recent carotid study d/w pt.   Final Interpretation: Right Carotid: Velocities in the right ICA are consistent with a 1-39% stenosis. The ECA appears <50% stenosed. Calcification may obscure higher velocities.  Left Carotid: Velocities in the left ICA are consistent with a 40-59% stenosis.Non-hemodynamically significant plaque noted in the CCA. The ECA appears >50% stenosed. Calcification may obscure higher velocities.  Vertebrals: Bilateral vertebral arteries demonstrate antegrade flow. Subclavians: Normal flow hemodynamics were seen in bilateral subclavian arteries. ====================================  PMH and SH reviewed  ROS: Per HPI unless specifically indicated in ROS section   Meds, vitals, and  allergies reviewed.   GEN: nad, alert and oriented HEENT: mucous membranes moist NECK: supple w/o LA CV: rrr. PULM: ctab, no inc wob ABD: soft, +bs EXT: no edema SKIN: no acute rash CN 2-12 wnl B, S/S/DTR wnl x4 except for mild L sided facial droop that was noted at ER.

## 2017-11-21 NOTE — Assessment & Plan Note (Signed)
Episodic, L sided, no sx currently.  Multiple events.  No focal weakness other than mild L facial droop.  Prev carotid study noted.  Will check MRI brain.  Would continue plavix and ASA for now.  I don't want to induce hypoperfusion, so I didn't change her meds.    L facial droop was noted at ER- not an acute finding today.    Statin intolerant prev.    I will update vascular clinic.  Rationale for tx d/w pt.  Routine/ER cautions d/w pt.  If return of sx or other new sx, then to ER.   >25 minutes spent in face to face time with patient, >50% spent in counselling or coordination of care.

## 2017-11-26 ENCOUNTER — Ambulatory Visit
Admission: RE | Admit: 2017-11-26 | Discharge: 2017-11-26 | Disposition: A | Discharge status: Home / Self Care | Payer: Medicare Other | Source: Ambulatory Visit | Attending: Family Medicine | Admitting: Family Medicine

## 2017-11-26 DIAGNOSIS — R29810 Facial weakness: Secondary | ICD-10-CM | POA: Diagnosis not present

## 2017-11-27 ENCOUNTER — Encounter: Payer: Self-pay | Admitting: Family Medicine

## 2017-11-27 DIAGNOSIS — I639 Cerebral infarction, unspecified: Secondary | ICD-10-CM | POA: Insufficient documentation

## 2017-11-29 ENCOUNTER — Ambulatory Visit (INDEPENDENT_AMBULATORY_CARE_PROVIDER_SITE_OTHER): Payer: Medicare Other | Admitting: Family Medicine

## 2017-11-29 ENCOUNTER — Encounter: Payer: Self-pay | Admitting: Family Medicine

## 2017-11-29 VITALS — BP 140/78 | HR 64 | Temp 98.5°F | Ht 64.0 in | Wt 107.5 lb

## 2017-11-29 DIAGNOSIS — E785 Hyperlipidemia, unspecified: Secondary | ICD-10-CM | POA: Diagnosis not present

## 2017-11-29 MED ORDER — PRAVASTATIN SODIUM 10 MG PO TABS: 10.0000 mg | ORAL_TABLET | Freq: Every day | ORAL | 3 refills | Status: DC

## 2017-11-29 NOTE — Progress Notes (Signed)
She is cutting back on smoking.  D/w pt.   She wasn't sure she had tried pravastatin in the past.  D/w pt about rationale for use.  We talked about statin use in general.  No more vision loss, no dizzy spells, and "my legs are doing great."    We talked about her previous MRI.  We talked about rationale for follow-up with the vascular clinic.  I got a staff message from the vascular clinic stating they would contact her about follow-up.  We talked about primary versus secondary prevention of stroke.  Either way, her plan would not change with the following goals-smoking cessation, blood pressure control, statin use, etc.  She is tolerating aspirin and Plavix.  ROS: Per HPI unless specifically indicated in ROS section   Meds, vitals, and allergies reviewed.   GEN: nad, alert and oriented HEENT: mucous membranes moist NECK: supple w/o LA CV: rrr.  PULM: ctab, no inc wob ABD: soft, +bs EXT: no edema SKIN: Well-perfused

## 2017-11-29 NOTE — Patient Instructions (Addendum)
You should get a call from the vascular clinic.  Let me know if you don't hear from them by next week.   If you have any new troubles, then go to the ER.    Try taking pravastatin.  If you have aches, then cut back to 1-2 tabs a week.  If you can't tolerate it at all, then let me know.  If you can tolerate it then recheck labs in early 2020.  Take care.  Glad to see you.

## 2017-11-30 ENCOUNTER — Other Ambulatory Visit: Payer: Self-pay

## 2017-11-30 DIAGNOSIS — I6523 Occlusion and stenosis of bilateral carotid arteries: Secondary | ICD-10-CM

## 2017-11-30 NOTE — Assessment & Plan Note (Signed)
We talked about her previous MRI.  We talked about rationale for follow-up with the vascular clinic.  I got a staff message from the vascular clinic stating they would contact her about follow-up.  See after visit summary.  We talked about primary versus secondary prevention of stroke.  Either way, her plan would not change with the following goals-smoking cessation, blood pressure control, statin use, etc.  She is tolerating aspirin and Plavix.  Reasonable to try pravastatin 10 mg a day.  If she has aches on that she can cut back on the frequency of dosing.  She may be able to tolerate 1 or 2 doses per week.  We will check labs later on assuming she is able to take any of the medication.  If she cannot tolerate it at all she will stop and let me know.  I appreciate the help of all involved.  All questions answered.  Routine cautions given.  If she has any new neurologic symptoms I want her to go to the emergency room immediately.

## 2017-12-01 ENCOUNTER — Telehealth: Payer: Self-pay | Admitting: Vascular Surgery

## 2017-12-01 NOTE — Telephone Encounter (Signed)
sch appt spk to pt mld ltr 12/19/17 12pm CTA Head and neck 12/28/17 345pm f/u MD

## 2017-12-19 ENCOUNTER — Ambulatory Visit
Admission: RE | Admit: 2017-12-19 | Discharge: 2017-12-19 | Disposition: A | Discharge status: Home / Self Care | Payer: Medicare Other | Source: Ambulatory Visit | Attending: Vascular Surgery | Admitting: Vascular Surgery

## 2017-12-19 DIAGNOSIS — I6523 Occlusion and stenosis of bilateral carotid arteries: Secondary | ICD-10-CM

## 2017-12-19 DIAGNOSIS — R42 Dizziness and giddiness: Secondary | ICD-10-CM | POA: Diagnosis not present

## 2017-12-19 DIAGNOSIS — H538 Other visual disturbances: Secondary | ICD-10-CM | POA: Diagnosis not present

## 2017-12-19 MED ORDER — IOPAMIDOL (ISOVUE-370) INJECTION 76%
75.0000 mL | Freq: Once | INTRAVENOUS | Status: AC | PRN
  Administered 2017-12-19: 75 mL via INTRAVENOUS

## 2017-12-25 ENCOUNTER — Other Ambulatory Visit: Payer: Self-pay | Admitting: *Deleted

## 2017-12-25 DIAGNOSIS — E785 Hyperlipidemia, unspecified: Secondary | ICD-10-CM

## 2017-12-25 MED ORDER — ENALAPRIL MALEATE 10 MG PO TABS: 15.0000 mg | ORAL_TABLET | Freq: Every day | ORAL | 3 refills | Status: DC

## 2017-12-25 MED ORDER — PRAVASTATIN SODIUM 10 MG PO TABS: 10.0000 mg | ORAL_TABLET | Freq: Every day | ORAL | 3 refills | Status: DC

## 2017-12-28 ENCOUNTER — Encounter: Payer: Self-pay | Admitting: Vascular Surgery

## 2017-12-28 ENCOUNTER — Other Ambulatory Visit: Payer: Self-pay

## 2017-12-28 ENCOUNTER — Ambulatory Visit: Payer: Medicare Other | Admitting: Vascular Surgery

## 2017-12-28 VITALS — BP 160/84 | HR 66 | Temp 98.0°F | Resp 16 | Ht 64.0 in | Wt 110.0 lb

## 2017-12-28 DIAGNOSIS — I6522 Occlusion and stenosis of left carotid artery: Secondary | ICD-10-CM

## 2017-12-28 NOTE — Progress Notes (Signed)
History of Present Illness:  Patient is a 83 y.o. year old female who presents for evaluation of symptomatic carotid stenosis.  She is regularly followed by Dr. Oneida Alar for PAD and carotid stenosis.  On Nov 21 2017 she had episode of seeing multiple colors in the left occular field and at one point she had loss of vision for a few seconds.  She was sent for a CTA of the head and neck.  This revealed 55 % irregular stenosis at the carotid bifurcation and the ICA.  The patient denies previous symptoms of TIA, amaurosis, or stroke.  The patient is currently on Plavix and aspirin antiplatelet therapy.    Other medical problems include hyperlipidemia, PAD and hypertention.  She has history of chronic tobacco abuse  These are currently stable and followed by Dr. Kamisha Stain.   Past Medical History:  Diagnosis Date  . Cataract   . CVA (cerebral vascular accident) ([place])   . Double vision   . Facial droop 10/2017   left  . History of shingles 2012  . HLD (hyperlipidemia)   . HTN (hypertension)   . Hypothyroidism   . PVD (peripheral vascular disease) ([place])   . Vision loss    temporary    Past Surgical History:  Procedure Laterality Date  . ABDOMINAL EXPLORATION SURGERY  1950s   "they thought I was pregnant; went in to explore"  . APPENDECTOMY  childhood  . BACK SURGERY    . BREAST LUMPECTOMY Left 1960's   benign  . carotid ultrasound  06/19/2007   0-39% stenosis  . CATARACT EXTRACTION W/ INTRAOCULAR LENS IMPLANT Left 09/2016  . CATARACT EXTRACTION W/ INTRAOCULAR LENS IMPLANT Right 10/2016  . DILATION AND CURETTAGE OF UTERUS  1960's X 3   miscarriages  . DOPPLER ECHOCARDIOGRAPHY  06/19/2007   Normal EF 55-70%  . ILIAC ARTERY STENT Left 03/18/2016   common iliac stent (6 x 40), aortic stent (10 x 19)/notes 03/18/2016  . LAMINECTOMY AND MICRODISCECTOMY SPINE  1960's X 2  . MOLE REMOVAL  07/10/2016  . NSVD     x1  . PERIPHERAL VASCULAR CATHETERIZATION N/A 03/18/2016   Procedure:  Abdominal Aortogram w/Lower Extremity;  Surgeon: Elam Dutch, MD;  Location: [place] [facility];  Service: Cardiovascular;  Laterality: N/A;  . PERIPHERAL VASCULAR CATHETERIZATION  03/18/2016   Procedure: Peripheral Vascular Intervention;  Surgeon: Elam Dutch, MD;  Location: [place] [facility];  Service: Cardiovascular;;     Social History Social History   Tobacco Use  . Smoking status: Current Every Day Smoker    Packs/day: 0.33    Years: 62.00    Pack years: 20.46    Types: Cigarettes  . Smokeless tobacco: Never Used  . Tobacco comment: 4-5 cigarettes per day  Substance Use Topics  . Alcohol use: No    Alcohol/week: 0.0 standard drinks  . Drug use: No    Family History Family History  Problem Relation Age of Onset  . Stroke Father 49  . Cancer Brother   . Colon cancer Neg Hx   . Breast cancer Neg Hx     Allergies  Allergies  Allergen Reactions  . Aspirin Other (See Comments)    REACTION: Uncoated Stomach upset on empty stomach  . Codeine Nausea Only and Other (See Comments)    Pill needs to be E.coated  . Lipitor [Atorvastatin] Other (See Comments)    myalgia     Current Outpatient Medications  Medication Sig Dispense  Refill  . aspirin 81 MG EC tablet Take 81 mg by mouth 2 (two) times daily.     . cetirizine (ZYRTEC) 10 MG tablet Take 10 mg by mouth as needed for allergies.    Marland Kitchen clopidogrel (PLAVIX) 75 MG tablet Take 1 tablet (75 mg total) by mouth daily. 90 tablet 3  . enalapril (VASOTEC) 10 MG tablet Take 1.5 tablets (15 mg total) by mouth daily. 135 tablet 3  . hydrochlorothiazide (MICROZIDE) 12.5 MG capsule TAKE 1 CAPSULE BY MOUTH  EVERY MORNING 90 capsule 1  . levothyroxine (SYNTHROID, LEVOTHROID) 50 MCG tablet TAKE 1 TABLET BY MOUTH  DAILY 90 tablet 1  . Liniments (BLUE-EMU SUPER STRENGTH EX) Apply 1 application topically as needed (knee pain).    . metoprolol tartrate (LOPRESSOR) 100 MG tablet TAKE 1/2 TABLET BY MOUTH IN THE MORNING AND 1  TABLET IN THE EVENING    . pravastatin (PRAVACHOL) 10 MG tablet Take 1 tablet (10 mg total) by mouth daily. 90 tablet 3   No current facility-administered medications for this visit.     ROS:   General:  No weight loss, Fever, chills  HEENT: No recent headaches, no nasal bleeding, positive visual changes, no sore throat  Neurologic: No dizziness, blackouts, seizures. No recent symptoms of stroke or mini- stroke. No recent episodes of slurred speech, or temporary blindness.  Cardiac: No recent episodes of chest pain/pressure, no shortness of breath at rest.  No shortness of breath with exertion.  Denies history of atrial fibrillation or irregular heartbeat  Vascular: No history of rest pain in feet.  positive history of claudication.  No history of non-healing ulcer, No history of DVT   Pulmonary: No home oxygen, no productive cough, no hemoptysis,  No asthma or wheezing  Musculoskeletal:  [ ]  Arthritis, [ ]  Low back pain,  [ ]  Joint pain  Hematologic:No history of hypercoagulable state.  No history of easy bleeding.  No history of anemia  Gastrointestinal: No hematochezia or melena,  No gastroesophageal reflux, no trouble swallowing  Urinary: [ ]  chronic Kidney disease, [ ]  on HD - [ ]  MWF or [ ]  TTHS, [ ]  Burning with urination, [ ]  Frequent urination, [ ]  Difficulty urinating;   Skin: No rashes  Psychological: No history of anxiety,  No history of depression   Physical Examination  Vitals:   12/28/17 1528 12/28/17 1547  BP: (!) 191/87 (!) 160/84  Pulse: 66   Resp: 16   Temp: 98 F (36.7 C)   TempSrc: Oral   SpO2: 96%   Weight: 110 lb (49.9 kg)   Height: 5\' 4"  (1.626 m)     Body mass index is 18.88 kg/m.  General:  Alert and oriented, no acute distress HEENT: Normal, normocephalic Neck: No bruit or JVD Pulmonary: Clear to auscultation bilaterally Cardiac: Regular Rate and Rhythm without murmur Gastrointestinal: Soft, non-tender, non-distended, no mass, no  scars Skin: No rash Extremity Pulses:  2+ radial, brachial, femoral, non palpable dorsalis pedis, posterior tibial pulses bilaterally Musculoskeletal: No deformity or edema  Neurologic: Upper and lower extremity motor 5/5 and symmetric  DATA: Carotid duplex 11/09/2017 Left ICA 40-59% stenosis Right 1-39% stenosis   ASSESSMENT:  Symptomatic left carotid stenosis with recent history of left eye vision loss for a few seconds.  She had symptoms suggestive of ocular migraine which was also suggested by her ophthalmologist.  However on review of the CT angiogram it is a very irregular lesion in the left internal carotid artery with  ulceration and friable appearing plaque which certainly could have accounted for her symptoms as well.  I discussed with the patient today the possibility of left carotid endarterectomy for stroke prophylaxis.  Also discussed the risk of stroke from this lesion of about 5 %/year.  She is reluctant to proceed with carotid endarterectomy at this point.  She did say that she may consent if she has further symptoms.  Otherwise she would like to see if just placing her on a statin for now will prevent her from having any further events.   PLAN: Patient will continue her Plavix aspirin and statin.  She will call me if she has any new neurologic events and we would most likely proceed with left carotid endarterectomy.  Otherwise she will have a follow-up carotid duplex scan in 6 months.    Ruta Hinds, MD [facility] Office: [phone] Pager: [phone]

## 2018-02-22 ENCOUNTER — Other Ambulatory Visit (INDEPENDENT_AMBULATORY_CARE_PROVIDER_SITE_OTHER): Payer: Medicare Other

## 2018-02-22 DIAGNOSIS — E038 Other specified hypothyroidism: Secondary | ICD-10-CM | POA: Diagnosis not present

## 2018-02-22 LAB — TSH: TSH: 5.04 u[IU]/mL — ABNORMAL HIGH (ref 0.35–4.50)

## 2018-02-25 ENCOUNTER — Other Ambulatory Visit: Payer: Self-pay | Admitting: Family Medicine

## 2018-02-25 DIAGNOSIS — E039 Hypothyroidism, unspecified: Secondary | ICD-10-CM

## 2018-02-25 MED ORDER — LEVOTHYROXINE SODIUM 50 MCG PO TABS: ORAL_TABLET | ORAL | Status: DC

## 2018-02-26 ENCOUNTER — Other Ambulatory Visit: Payer: Self-pay | Admitting: *Deleted

## 2018-02-26 MED ORDER — LEVOTHYROXINE SODIUM 50 MCG PO TABS: ORAL_TABLET | ORAL | 0 refills | Status: DC

## 2018-04-25 ENCOUNTER — Other Ambulatory Visit: Payer: Self-pay | Admitting: Family Medicine

## 2018-04-25 NOTE — Telephone Encounter (Signed)
Patient is scheduled to have her thyroid level rechecked on Friday 04/27/2018.  Will wait for lab results prior to sending in refills.

## 2018-04-27 ENCOUNTER — Other Ambulatory Visit (INDEPENDENT_AMBULATORY_CARE_PROVIDER_SITE_OTHER): Payer: Medicare Other

## 2018-04-27 DIAGNOSIS — E039 Hypothyroidism, unspecified: Secondary | ICD-10-CM | POA: Diagnosis not present

## 2018-04-27 DIAGNOSIS — E785 Hyperlipidemia, unspecified: Secondary | ICD-10-CM | POA: Diagnosis not present

## 2018-04-27 LAB — LIPID PANEL
Cholesterol: 204 mg/dL — ABNORMAL HIGH (ref 0–200)
HDL: 86.7 mg/dL (ref >39.00)
LDL CALC: 84 mg/dL (ref 0–99)
NonHDL: 116.84
Total CHOL/HDL Ratio: 2
Triglycerides: 162 mg/dL — ABNORMAL HIGH (ref 0.0–149.0)
VLDL: 32.4 mg/dL (ref 0.0–40.0)

## 2018-04-27 LAB — HEPATIC FUNCTION PANEL
ALT: 19 U/L (ref 0–35)
AST: 20 U/L (ref 0–37)
Albumin: 4.2 g/dL (ref 3.5–5.2)
Alkaline Phosphatase: 81 U/L (ref 39–117)
Bilirubin, Direct: 0.1 mg/dL (ref 0.0–0.3)
Total Bilirubin: 0.4 mg/dL (ref 0.2–1.2)
Total Protein: 6.9 g/dL (ref 6.0–8.3)

## 2018-04-27 LAB — TSH: TSH: 4.69 u[IU]/mL — ABNORMAL HIGH (ref 0.35–4.50)

## 2018-04-30 ENCOUNTER — Other Ambulatory Visit: Payer: Self-pay | Admitting: Family Medicine

## 2018-04-30 DIAGNOSIS — E039 Hypothyroidism, unspecified: Secondary | ICD-10-CM

## 2018-04-30 MED ORDER — LEVOTHYROXINE SODIUM 50 MCG PO TABS: ORAL_TABLET | ORAL | 3 refills | Status: DC

## 2018-05-24 ENCOUNTER — Other Ambulatory Visit: Payer: Self-pay | Admitting: Family Medicine

## 2018-06-18 ENCOUNTER — Other Ambulatory Visit: Payer: Self-pay

## 2018-06-18 DIAGNOSIS — I6523 Occlusion and stenosis of bilateral carotid arteries: Secondary | ICD-10-CM

## 2018-06-27 ENCOUNTER — Other Ambulatory Visit: Payer: Self-pay

## 2018-06-27 NOTE — Patient Outreach (Signed)
[facility]) Care Management  06/27/2018  Jill Franco 07/08/1934 [id number]   Medication Adherence call to [name] Voice message left with a call back number. [name] is showing past due on Enalapril 10 mg under [facility] ins.   [place] Management Direct Dial [phone]  Fax [phone] [name]

## 2018-06-28 ENCOUNTER — Ambulatory Visit: Payer: Medicare Other | Admitting: Vascular Surgery

## 2018-06-28 ENCOUNTER — Encounter: Payer: Self-pay | Admitting: Family

## 2018-06-28 ENCOUNTER — Ambulatory Visit (HOSPITAL_COMMUNITY): Payer: Medicare Other | Attending: Vascular Surgery

## 2018-07-02 ENCOUNTER — Other Ambulatory Visit: Payer: Self-pay | Admitting: Family Medicine

## 2018-07-02 NOTE — Telephone Encounter (Signed)
Pt does not want levothyroxine to go to [facility] but wants 3 month supply to [facility]; I verified with pt taking 1 tab daily except take 2 tabs on Sundays and Wednesdays. I called [facility] spoke with  Meisha and cancelled levothyroxine there and sent electronically to [facility]. Pt voiced understanding and appreciative.

## 2018-08-17 DIAGNOSIS — H5213 Myopia, bilateral: Secondary | ICD-10-CM | POA: Diagnosis not present

## 2018-08-17 DIAGNOSIS — H26492 Other secondary cataract, left eye: Secondary | ICD-10-CM | POA: Diagnosis not present

## 2018-08-17 DIAGNOSIS — H35033 Hypertensive retinopathy, bilateral: Secondary | ICD-10-CM | POA: Diagnosis not present

## 2018-09-02 ENCOUNTER — Other Ambulatory Visit: Payer: Self-pay | Admitting: Family Medicine

## 2018-09-02 DIAGNOSIS — I739 Peripheral vascular disease, unspecified: Secondary | ICD-10-CM

## 2018-09-10 ENCOUNTER — Other Ambulatory Visit: Payer: Self-pay

## 2018-09-10 ENCOUNTER — Ambulatory Visit: Payer: Medicare Other

## 2018-09-10 ENCOUNTER — Encounter: Payer: Self-pay | Admitting: Family Medicine

## 2018-09-10 ENCOUNTER — Ambulatory Visit (INDEPENDENT_AMBULATORY_CARE_PROVIDER_SITE_OTHER): Payer: Medicare Other | Admitting: Family Medicine

## 2018-09-10 VITALS — BP 98/60 | HR 75 | Temp 97.3°F | Ht 64.0 in | Wt 109.4 lb

## 2018-09-10 DIAGNOSIS — I739 Peripheral vascular disease, unspecified: Secondary | ICD-10-CM

## 2018-09-10 DIAGNOSIS — E039 Hypothyroidism, unspecified: Secondary | ICD-10-CM | POA: Diagnosis not present

## 2018-09-10 DIAGNOSIS — E785 Hyperlipidemia, unspecified: Secondary | ICD-10-CM

## 2018-09-10 DIAGNOSIS — F17200 Nicotine dependence, unspecified, uncomplicated: Secondary | ICD-10-CM | POA: Diagnosis not present

## 2018-09-10 DIAGNOSIS — I10 Essential (primary) hypertension: Secondary | ICD-10-CM

## 2018-09-10 DIAGNOSIS — Z7189 Other specified counseling: Secondary | ICD-10-CM

## 2018-09-10 DIAGNOSIS — Z0000 Encounter for general adult medical examination without abnormal findings: Secondary | ICD-10-CM | POA: Diagnosis not present

## 2018-09-10 LAB — BASIC METABOLIC PANEL
BUN: 18 mg/dL (ref 6–23)
CO2: 30 mEq/L (ref 19–32)
Calcium: 9.4 mg/dL (ref 8.4–10.5)
Chloride: 95 mEq/L — ABNORMAL LOW (ref 96–112)
Creatinine, Ser: 0.79 mg/dL (ref 0.40–1.20)
GFR: 69.62 mL/min (ref >60.00)
Glucose, Bld: 89 mg/dL (ref 70–99)
Potassium: 4.1 mEq/L (ref 3.5–5.1)
Sodium: 133 mEq/L — ABNORMAL LOW (ref 135–145)

## 2018-09-10 LAB — TSH: TSH: 1.29 u[IU]/mL (ref 0.35–4.50)

## 2018-09-10 MED ORDER — FLUTICASONE PROPIONATE 50 MCG/ACT NA SUSP: 2.0000 | Freq: Every day | NASAL | Status: DC

## 2018-09-10 NOTE — Progress Notes (Signed)
I have personally reviewed the Medicare Annual Wellness questionnaire and have noted 1. The patient's medical and social history 2. Their use of alcohol, tobacco or illicit drugs 3. Their current medications and supplements 4. The patient's functional ability including ADL's, fall risks, home safety risks and hearing or visual             impairment. 5. Diet and physical activities 6. Evidence for depression or mood disorders  The patients weight, height, BMI have been recorded in the chart and visual acuity is per eye clinic.  I have made referrals, counseling and provided education to the patient based review of the above and I have provided the pt with a written personalized care plan for preventive services.  Provider list updated- see scanned forms.  Routine anticipatory guidance given to patient.  See health maintenance. The possibility exists that previously documented standard health maintenance information may have been brought forward from a previous encounter into this note.  If needed, that same information has been updated to reflect the current situation based on today's encounter.    Flu yearly  Shingles d/w pt about Shingrix vaccine PNA UTD Tetanus 2012 Hearing - passed. D/w pt. Declined hearing aids. Mammogram declined for now.  She'll consider.  Discussed rationale and she'll update me as needed.   DXA declined. D/w pt. smoking cessation is likely the most important step regarding her bone density. Living will d/w pt.Daughter Thayer Headings designated if patient were incapacitated.  Colonoscopy declined. Cognitive function addressed- see scanned forms- and if abnormal then additional documentation follows.   She tried mult ways to quit smoking, down to about ~6 cigs a day.  Encouraged gradual cessation.  Her knee pain is better now.    Pandemic considerations d/w pt.    Elevated Cholesterol: Using medications without problems: yes Muscle aches: no Diet  compliance:yes Exercise:yes Prev labs d/w pt.   Hypertension:    Using medication without problems or lightheadedness: yes Chest pain with exertion:no Edema:trace L>R BLE edema.   Short of breath:no Recheck BP 122/76, similar to her readings at home.    She had some chest congestion that improved with OTC cough meds. No discolored sputum.  No fevers.  She has some throat irritation, not improved with zyrtec and salt water gargles.  She isn't stuffy.  She has some rhinorrhea.    Hypothyroidism.  No neck mass, no dysphagia.  Compliant.  Due for f/u labs.   PAD.  D/w pt about possible carotid u/s and she'll call vascular when she is comfortable given the pandemic.  No new neuro sx.    PMH and SH reviewed  Meds, vitals, and allergies reviewed.   ROS: Per HPI.  Unless specifically indicated otherwise in HPI, the patient denies:  General: fever. Eyes: acute vision changes ENT: sore throat Cardiovascular: chest pain Respiratory: SOB GI: vomiting GU: dysuria Musculoskeletal: acute back pain Derm: acute rash Neuro: acute motor dysfunction Psych: worsening mood Endocrine: polydipsia Heme: bleeding Allergy: hayfever  GEN: nad, alert and oriented HEENT: mucous membranes moist NECK: supple w/o LA CV: rrr. PULM: ctab, no inc wob ABD: soft, +bs EXT: no edema SKIN: no acute rash

## 2018-09-10 NOTE — Patient Instructions (Addendum)
Check with your insurance to see if they will cover the shingrix shot. Go to the lab on the way out.  We'll contact you with your lab report. Try using flonase.   Don't change your other meds for now.  Take care.  Glad to see you.

## 2018-09-12 DIAGNOSIS — Z0000 Encounter for general adult medical examination without abnormal findings: Secondary | ICD-10-CM | POA: Insufficient documentation

## 2018-09-12 NOTE — Assessment & Plan Note (Signed)
No neck mass, no dysphagia.  Compliant.  Due for f/u labs.  See notes on labs.

## 2018-09-12 NOTE — Assessment & Plan Note (Signed)
Flu yearly  Shingles d/w pt about Shingrix vaccine PNA UTD Tetanus 2012 Hearing - passed. D/w pt. Declined hearing aids. Mammogram declined for now.  She'll consider.  Discussed rationale and she'll update me as needed.   DXA declined. D/w pt. smoking cessation is likely the most important step regarding her bone density. Living will d/w pt.Daughter Jill Franco designated if patient were incapacitated.  Colonoscopy declined. Cognitive function addressed- see scanned forms- and if abnormal then additional documentation follows.

## 2018-09-12 NOTE — Assessment & Plan Note (Signed)
Lipids were previously improved on last check.  Continue statin.  She agrees.

## 2018-09-12 NOTE — Assessment & Plan Note (Signed)
Recheck BP 122/76, similar to her readings at home.  Continue current medications.  She agrees.

## 2018-09-12 NOTE — Assessment & Plan Note (Signed)
D/w pt about possible carotid u/s and she'll call vascular when she is comfortable given the pandemic.  No new neuro sx.

## 2018-09-12 NOTE — Assessment & Plan Note (Signed)
Living will d/w pt.Daughter Janice designated if patient were incapacitated.  

## 2018-09-12 NOTE — Assessment & Plan Note (Signed)
Encourage gradual cessation.

## 2018-09-19 ENCOUNTER — Telehealth: Payer: Self-pay | Admitting: Family Medicine

## 2018-09-19 DIAGNOSIS — E785 Hyperlipidemia, unspecified: Secondary | ICD-10-CM

## 2018-09-19 DIAGNOSIS — I739 Peripheral vascular disease, unspecified: Secondary | ICD-10-CM

## 2018-09-19 NOTE — Telephone Encounter (Signed)
Patient stated that she is needing all of her prescriptions [facility]ed [facility]. She has tried to contact them 3 different times and they tell patient they are working from home, someone will get this taken care of and she gets cut off. Patient stated she is not sure what else she should do in order to get her medications   [facility] mail service.    Patient's C/B # [phone]

## 2018-09-20 MED ORDER — HYDROCHLOROTHIAZIDE 12.5 MG PO CAPS: 12.5000 mg | ORAL_CAPSULE | Freq: Every morning | ORAL | 1 refills | Status: DC

## 2018-09-20 MED ORDER — LEVOTHYROXINE SODIUM 50 MCG PO TABS: ORAL_TABLET | ORAL | 3 refills | Status: DC

## 2018-09-20 MED ORDER — ENALAPRIL MALEATE 10 MG PO TABS: 15.0000 mg | ORAL_TABLET | Freq: Every day | ORAL | 3 refills | Status: DC

## 2018-09-20 MED ORDER — METOPROLOL TARTRATE 100 MG PO TABS: ORAL_TABLET | ORAL | 1 refills | Status: DC

## 2018-09-20 MED ORDER — PRAVASTATIN SODIUM 10 MG PO TABS: 10.0000 mg | ORAL_TABLET | Freq: Every day | ORAL | 2 refills | Status: DC

## 2018-09-20 NOTE — Telephone Encounter (Signed)
Refills sent to [facility].  [name] notified by telephone.

## 2018-11-23 ENCOUNTER — Other Ambulatory Visit: Payer: Self-pay

## 2018-11-23 ENCOUNTER — Ambulatory Visit (INDEPENDENT_AMBULATORY_CARE_PROVIDER_SITE_OTHER): Payer: Medicare Other

## 2018-11-23 DIAGNOSIS — Z23 Encounter for immunization: Secondary | ICD-10-CM

## 2018-12-05 ENCOUNTER — Telehealth: Payer: Self-pay

## 2018-12-05 MED ORDER — ENALAPRIL MALEATE 10 MG PO TABS: 15.0000 mg | ORAL_TABLET | Freq: Every day | ORAL | 0 refills | Status: DC

## 2018-12-05 NOTE — Telephone Encounter (Signed)
Received fax from [facility] stating that Enalapril is out of stock with their provider I guess and to send this to the local pharmacy for the patient and change medication to something else. I spoke with the patient. Patient wanted to try local pharmacy but if the cost will be too much she would let us know. I resent RX to [facility] and patient asked to do this for 30 day supply for now. FYI to PCP for the record.

## 2019-01-24 ENCOUNTER — Other Ambulatory Visit: Payer: Self-pay | Admitting: Family Medicine

## 2019-01-24 DIAGNOSIS — I739 Peripheral vascular disease, unspecified: Secondary | ICD-10-CM

## 2019-03-14 DIAGNOSIS — H524 Presbyopia: Secondary | ICD-10-CM | POA: Diagnosis not present

## 2019-03-14 DIAGNOSIS — H35033 Hypertensive retinopathy, bilateral: Secondary | ICD-10-CM | POA: Diagnosis not present

## 2019-03-29 ENCOUNTER — Telehealth: Payer: Self-pay

## 2019-03-29 NOTE — Telephone Encounter (Signed)
I don't know who is in her network.  She'll have to check on that.   We can start with getting her seen here and then send her for imaging/ultrasound if needed.  Please offer OV here or have her check on in network options.  Thanks.

## 2019-03-29 NOTE — Telephone Encounter (Signed)
Patient states she is having leg pain again due to PAD. She does not want to go back to the vascular and vein doctor she was seen-last time in 2019. She wants to see a different one and for them to be in her network. Leg pain is worse in the right than the left. Feels the same as before when she went to see the specialist. Pain started about 1 month ago and is getting worse. No chest pain or tightness or numbness sensation present. Does patient need to here first?  Also patient wanted to let Dr Sharlene Motts know that she is still smoking but is trying to quit, she has tried gum and patches and quitting cold [place] with no success at this time.

## 2019-03-29 NOTE — Telephone Encounter (Signed)
Appointment scheduled.

## 2019-04-02 ENCOUNTER — Ambulatory Visit (INDEPENDENT_AMBULATORY_CARE_PROVIDER_SITE_OTHER): Payer: Medicare Other | Admitting: Family Medicine

## 2019-04-02 ENCOUNTER — Ambulatory Visit (INDEPENDENT_AMBULATORY_CARE_PROVIDER_SITE_OTHER)
Admission: RE | Admit: 2019-04-02 | Discharge: 2019-04-02 | Disposition: A | Discharge status: Home / Self Care | Payer: Medicare Other | Source: Ambulatory Visit | Attending: Family Medicine | Admitting: Family Medicine

## 2019-04-02 ENCOUNTER — Encounter: Payer: Self-pay | Admitting: Family Medicine

## 2019-04-02 ENCOUNTER — Other Ambulatory Visit: Payer: Self-pay

## 2019-04-02 VITALS — BP 142/76 | HR 61 | Temp 95.8°F | Ht 64.0 in | Wt 111.5 lb

## 2019-04-02 DIAGNOSIS — M79604 Pain in right leg: Secondary | ICD-10-CM

## 2019-04-02 DIAGNOSIS — I739 Peripheral vascular disease, unspecified: Secondary | ICD-10-CM

## 2019-04-02 DIAGNOSIS — R0989 Other specified symptoms and signs involving the circulatory and respiratory systems: Secondary | ICD-10-CM

## 2019-04-02 DIAGNOSIS — I6529 Occlusion and stenosis of unspecified carotid artery: Secondary | ICD-10-CM

## 2019-04-02 DIAGNOSIS — M25561 Pain in right knee: Secondary | ICD-10-CM | POA: Diagnosis not present

## 2019-04-02 NOTE — Patient Instructions (Signed)
Go to the lab on the way out.  We'll update you about the xray and we'll work on getting the ultrasound set up. We'll go from there.  Try using nicotine gum in the meantime.  Take care.  Glad to see you.

## 2019-04-02 NOTE — Progress Notes (Signed)
This visit occurred during the SARS-CoV-2 public health emergency.  Safety protocols were in place, including screening questions prior to the visit, additional usage of staff PPE, and extensive cleaning of exam room while observing appropriate contact time as indicated for disinfecting solutions.  She wanted to get a second opinion from vascular.   She is checking on a 2nd opinion within the vascular clinic on 04/26/19.  She is waiting to hear about that.  I'll await update from patient She agrees with that.    Leg pain.  Started in the R hip, then moved down the R leg.  Now with pain in R shin with walking.  She doesn't get calf cramping.    She has R knee pain at baseline with walking.  She has some BLE edema at baseline, better with elevation.    No CP.  Smoking 7-8 cigs a day.  Discussed cessation, encouraged.  Discussed using nicotine gum.  Still on statin and beta blocker/ACE/aspirin/plavix at baseline.  H/o carotid narrowing prev noted.    Prev arterial disease noted, discussed with patient at office visit.  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   GEN: nad, alert and oriented HEENT: ncat NECK: supple w/o LA CV: rrr. PULM: ctab, no inc wob ABD: soft, +bs EXT: Trace bilateral lower extremity edema SKIN: no acute rash Right knee without crepitus. Decreased dorsalis pedis pulses bilaterally.  No ulceration on the feet.  She  does not have delayed capillary refill in the extremities.

## 2019-04-05 ENCOUNTER — Other Ambulatory Visit: Payer: Self-pay

## 2019-04-05 ENCOUNTER — Inpatient Hospital Stay (HOSPITAL_BASED_OUTPATIENT_CLINIC_OR_DEPARTMENT_OTHER)
Admission: EM | Admit: 2019-04-05 | Discharge: 2019-04-08 | DRG: 378 | Disposition: A | Discharge status: Home / Self Care | Payer: Medicare Other | Attending: Internal Medicine | Admitting: Internal Medicine

## 2019-04-05 ENCOUNTER — Telehealth: Payer: Self-pay

## 2019-04-05 ENCOUNTER — Encounter (HOSPITAL_BASED_OUTPATIENT_CLINIC_OR_DEPARTMENT_OTHER): Payer: Self-pay

## 2019-04-05 ENCOUNTER — Emergency Department (HOSPITAL_BASED_OUTPATIENT_CLINIC_OR_DEPARTMENT_OTHER): Payer: Medicare Other

## 2019-04-05 DIAGNOSIS — R1084 Generalized abdominal pain: Secondary | ICD-10-CM | POA: Diagnosis not present

## 2019-04-05 DIAGNOSIS — Z7902 Long term (current) use of antithrombotics/antiplatelets: Secondary | ICD-10-CM | POA: Diagnosis not present

## 2019-04-05 DIAGNOSIS — Z809 Family history of malignant neoplasm, unspecified: Secondary | ICD-10-CM

## 2019-04-05 DIAGNOSIS — Z7982 Long term (current) use of aspirin: Secondary | ICD-10-CM

## 2019-04-05 DIAGNOSIS — Z823 Family history of stroke: Secondary | ICD-10-CM | POA: Diagnosis not present

## 2019-04-05 DIAGNOSIS — F17210 Nicotine dependence, cigarettes, uncomplicated: Secondary | ICD-10-CM | POA: Diagnosis present

## 2019-04-05 DIAGNOSIS — I700 Atherosclerosis of aorta: Secondary | ICD-10-CM | POA: Diagnosis not present

## 2019-04-05 DIAGNOSIS — Z20822 Contact with and (suspected) exposure to covid-19: Secondary | ICD-10-CM | POA: Diagnosis not present

## 2019-04-05 DIAGNOSIS — K621 Rectal polyp: Secondary | ICD-10-CM | POA: Diagnosis not present

## 2019-04-05 DIAGNOSIS — Z66 Do not resuscitate: Secondary | ICD-10-CM | POA: Diagnosis present

## 2019-04-05 DIAGNOSIS — E039 Hypothyroidism, unspecified: Secondary | ICD-10-CM | POA: Diagnosis not present

## 2019-04-05 DIAGNOSIS — F17200 Nicotine dependence, unspecified, uncomplicated: Secondary | ICD-10-CM | POA: Diagnosis not present

## 2019-04-05 DIAGNOSIS — I10 Essential (primary) hypertension: Secondary | ICD-10-CM | POA: Diagnosis not present

## 2019-04-05 DIAGNOSIS — D128 Benign neoplasm of rectum: Secondary | ICD-10-CM | POA: Diagnosis not present

## 2019-04-05 DIAGNOSIS — Z8673 Personal history of transient ischemic attack (TIA), and cerebral infarction without residual deficits: Secondary | ICD-10-CM

## 2019-04-05 DIAGNOSIS — E038 Other specified hypothyroidism: Secondary | ICD-10-CM | POA: Diagnosis not present

## 2019-04-05 DIAGNOSIS — E871 Hypo-osmolality and hyponatremia: Secondary | ICD-10-CM | POA: Diagnosis not present

## 2019-04-05 DIAGNOSIS — IMO0001 Reserved for inherently not codable concepts without codable children: Secondary | ICD-10-CM | POA: Diagnosis present

## 2019-04-05 DIAGNOSIS — R1030 Lower abdominal pain, unspecified: Secondary | ICD-10-CM | POA: Diagnosis not present

## 2019-04-05 DIAGNOSIS — D62 Acute posthemorrhagic anemia: Secondary | ICD-10-CM | POA: Diagnosis not present

## 2019-04-05 DIAGNOSIS — I739 Peripheral vascular disease, unspecified: Secondary | ICD-10-CM | POA: Diagnosis not present

## 2019-04-05 DIAGNOSIS — K635 Polyp of colon: Secondary | ICD-10-CM | POA: Diagnosis not present

## 2019-04-05 DIAGNOSIS — D123 Benign neoplasm of transverse colon: Secondary | ICD-10-CM | POA: Diagnosis not present

## 2019-04-05 DIAGNOSIS — K5520 Angiodysplasia of colon without hemorrhage: Secondary | ICD-10-CM | POA: Diagnosis present

## 2019-04-05 DIAGNOSIS — R109 Unspecified abdominal pain: Secondary | ICD-10-CM | POA: Diagnosis not present

## 2019-04-05 DIAGNOSIS — Z79890 Hormone replacement therapy (postmenopausal): Secondary | ICD-10-CM

## 2019-04-05 DIAGNOSIS — E782 Mixed hyperlipidemia: Secondary | ICD-10-CM | POA: Diagnosis present

## 2019-04-05 DIAGNOSIS — K921 Melena: Principal | ICD-10-CM | POA: Diagnosis present

## 2019-04-05 DIAGNOSIS — Z03818 Encounter for observation for suspected exposure to other biological agents ruled out: Secondary | ICD-10-CM | POA: Diagnosis not present

## 2019-04-05 DIAGNOSIS — R911 Solitary pulmonary nodule: Secondary | ICD-10-CM

## 2019-04-05 DIAGNOSIS — J309 Allergic rhinitis, unspecified: Secondary | ICD-10-CM | POA: Diagnosis not present

## 2019-04-05 DIAGNOSIS — K922 Gastrointestinal hemorrhage, unspecified: Secondary | ICD-10-CM

## 2019-04-05 DIAGNOSIS — E785 Hyperlipidemia, unspecified: Secondary | ICD-10-CM | POA: Diagnosis not present

## 2019-04-05 LAB — COMPREHENSIVE METABOLIC PANEL
ALT: 17 U/L (ref 0–44)
AST: 23 U/L (ref 15–41)
Albumin: 3.8 g/dL (ref 3.5–5.0)
Alkaline Phosphatase: 82 U/L (ref 38–126)
Anion gap: 5 (ref 5–15)
BUN: 15 mg/dL (ref 8–23)
CO2: 26 mmol/L (ref 22–32)
Calcium: 8.9 mg/dL (ref 8.9–10.3)
Chloride: 98 mmol/L (ref 98–111)
Creatinine, Ser: 0.63 mg/dL (ref 0.44–1.00)
GFR calc Af Amer: >60 mL/min (ref >60)
GFR calc non Af Amer: >60 mL/min (ref >60)
Glucose, Bld: 98 mg/dL (ref 70–99)
Potassium: 4 mmol/L (ref 3.5–5.1)
Sodium: 129 mmol/L — ABNORMAL LOW (ref 135–145)
Total Bilirubin: 0.7 mg/dL (ref 0.3–1.2)
Total Protein: 6.9 g/dL (ref 6.5–8.1)

## 2019-04-05 LAB — APTT: aPTT: 28 seconds (ref 24–36)

## 2019-04-05 LAB — OCCULT BLOOD X 1 CARD TO LAB, STOOL: Fecal Occult Bld: POSITIVE — AB

## 2019-04-05 LAB — CBC WITH DIFFERENTIAL/PLATELET
Abs Immature Granulocytes: 0.01 10*3/uL (ref 0.00–0.07)
Basophils Absolute: 0.1 10*3/uL (ref 0.0–0.1)
Basophils Relative: 1 %
Eosinophils Absolute: 0.1 10*3/uL (ref 0.0–0.5)
Eosinophils Relative: 2 %
HCT: 40.4 % (ref 36.0–46.0)
Hemoglobin: 12.8 g/dL (ref 12.0–15.0)
Immature Granulocytes: 0 %
Lymphocytes Relative: 22 %
Lymphs Abs: 1.4 10*3/uL (ref 0.7–4.0)
MCH: 29 pg (ref 26.0–34.0)
MCHC: 31.7 g/dL (ref 30.0–36.0)
MCV: 91.4 fL (ref 80.0–100.0)
Monocytes Absolute: 0.9 10*3/uL (ref 0.1–1.0)
Monocytes Relative: 14 %
Neutro Abs: 3.8 10*3/uL (ref 1.7–7.7)
Neutrophils Relative %: 61 %
Platelets: 274 10*3/uL (ref 150–400)
RBC: 4.42 MIL/uL (ref 3.87–5.11)
RDW: 13 % (ref 11.5–15.5)
WBC: 6.3 10*3/uL (ref 4.0–10.5)
nRBC: 0 % (ref 0.0–0.2)

## 2019-04-05 LAB — PROTIME-INR
INR: 1 (ref 0.8–1.2)
Prothrombin Time: 13.1 seconds (ref 11.4–15.2)

## 2019-04-05 MED ORDER — HYDRALAZINE HCL 20 MG/ML IJ SOLN
10.0000 mg | Freq: Once | INTRAMUSCULAR | Status: AC
  Administered 2019-04-05: 10 mg via INTRAVENOUS
  Filled 2019-04-05: qty 1

## 2019-04-05 MED ORDER — PANTOPRAZOLE SODIUM 40 MG IV SOLR
40.0000 mg | Freq: Two times a day (BID) | INTRAVENOUS | Status: DC
  Administered 2019-04-05 – 2019-04-08 (×6): 40 mg via INTRAVENOUS
  Filled 2019-04-05 (×6): qty 40

## 2019-04-05 MED ORDER — SODIUM CHLORIDE 0.9 % IV BOLUS
500.0000 mL | Freq: Once | INTRAVENOUS | Status: AC
  Administered 2019-04-05: 500 mL via INTRAVENOUS

## 2019-04-05 MED ORDER — IOHEXOL 300 MG/ML  SOLN
100.0000 mL | Freq: Once | INTRAMUSCULAR | Status: AC | PRN
  Administered 2019-04-05: 100 mL via INTRAVENOUS

## 2019-04-05 NOTE — Telephone Encounter (Signed)
Jill Franco - Client TELEPHONE ADVICE RECORD [facility] Patient Name: Jill Franco Gender: Female DOB: 01/01/1935 Age: 85 Y 10 M 12 D Return Phone Number: [phone] (Primary) Address: City/State/Zip: [place] [address] Client Jill Franco - Client Client Site [facility] - Franco Physician Renford Dills - MD Contact Type Call Who Is Calling Patient / Member / Family / Caregiver Call Type Triage / Clinical Relationship To Patient Self Return Phone Number [phone] (Primary) Chief Complaint Abdominal Pain Reason for Call Symptomatic / Request for [name], bright red blood. Plus she has abdominal pain [name] Nurse: Ronnald Ramp, RN, Miranda Date/Time (Eastern Time): 04/05/2019 11:04:21 AM Confirm and document reason for call. If symptomatic, describe symptoms. ---Caller states she has had bright blood in her stool earlier this week and again this morning. She has pain after she has a BM and in the evenings for the past week. Also her feet are swollen and her legs are hurting. Her right leg is hurting more than her left. Has the patient had close contact with a person known or suspected to have the novel coronavirus illness OR traveled / lives in area with major community spread (including international travel) in the last 14 days from the onset of symptoms? * If Asymptomatic, screen for exposure and travel within the last 14 days. ---No Does the patient have any new or worsening symptoms? ---Yes Will a triage be completed? ---Yes Related visit to physician within the last 2 weeks? ---No Does the PT have any chronic conditions? (i.e. diabetes, asthma, this includes High risk factors for pregnancy, etc.) ---Yes List chronic conditions. ---Thyroid, Blood thinner, HTN Is this a  behavioral health or substance abuse call? ---No Guidelines Guideline Title Affirmed Question Affirmed Notes Nurse Date/Time (Eastern Time) Rectal Bleeding Black or tarry bowel movements (Exception: chronic-unchanged blackgrey bowel movements Ronnald Ramp, RN, Miranda 04/05/2019 11:07:37 AM PLEASE NOTE: All timestamps contained within this report are represented as [place] Standard Time. CONFIDENTIALTY NOTICE: This fax transmission is intended only for the addressee. It contains information that is legally privileged, confidential or otherwise protected from use or disclosure. If you are not the intended recipient, you are strictly prohibited from reviewing, disclosing, copying using or disseminating any of this information or taking any action in reliance on or regarding this information. If you have received this fax in error, please notify us immediately by telephone so that we can arrange for its return to [place]. Phone: [phone], Toll-Free: [phone], Fax: [phone] Page: 2 of 2 Call Id: [id number] Guidelines Guideline Title Affirmed Question Affirmed Notes Nurse Date/Time Eilene Ghazi Time) AND is taking iron pills or Pepto-Bismol) Disp. Time Eilene Ghazi Time) Disposition Final User 04/05/2019 11:01:16 AM Attempt made - no message left Burney Gauze, Miranda 04/05/2019 11:12:34 AM Go to ED Now Yes Ronnald Ramp, RN, Miranda Caller Disagree/Comply Comply Caller Understands Yes PreDisposition Did not know what to do Care Advice Given Per Guideline GO TO ED NOW: * You need to be seen in the Emergency Department. * Go to the ED at ___________ [facility] now. Drive carefully. CARE ADVICE given per Rectal Bleeding (Adult) guideline. Comments User: Leverne Humbles, RN Date/Time Eilene Ghazi Time): 04/05/2019 11:01:56 AM No answer after phone rang 6-7 times, line disconnected Referrals GO TO FACILITY OTHER - SPECIFY

## 2019-04-05 NOTE — ED Provider Notes (Signed)
Medical screening examination/treatment/procedure(s) were conducted as a shared visit with non-physician practitioner(s) and myself.  I personally evaluated the patient during the encounter.    Patient saw red blood yesterday morning with bowel movement.  She reports it occurred again today.  She reports today she did have episode of lightheadedness.  Reports she does get some abdominal pain after bowel movement.  Patient takes Plavix and aspirin.  Alert and appropriate.  No respiratory distress.  Heart regular with occasional ectopy.  Abdomen soft without guarding.  Skin warm and dry.  Mental status clear movements coordinated purposeful symmetric.  Patient presents with lower GI bleed on aspirin and Plavix.  She has had several recurrent episodes.  Hemoglobin is lower than documented baseline.  Plan for admission for GI bleed.   Charlesetta Shanks, MD 04/05/19 [phone]

## 2019-04-05 NOTE — Telephone Encounter (Signed)
Received fax from team health/access nurse; pt has bright red blood in stool, abd pain and both legs hurt and swollen but rt leg hurts worse. Have not received the [facility] nurse access note in portal will send fax for scanning and copy put in Dr Darnell Level and Dr Josefine Class in box. Dr Damita Dunnings out of office today and not on computer. I spoke with pt and her daughter will be there in about 1 hr and pt is going to [facility] ED. ED precautions given prior to daughter getting there and pt voiced understanding.

## 2019-04-05 NOTE — Telephone Encounter (Signed)
Noted.  Agree with ER evaluation

## 2019-04-05 NOTE — Plan of Care (Signed)

## 2019-04-05 NOTE — ED Triage Notes (Signed)
Pt c/o blood in stool x 1 yesterday and x 1 today-NAD-steady gait

## 2019-04-05 NOTE — Assessment & Plan Note (Signed)
She is checking on getting follow-up with vascular.  Discussed smoking cessation.  Discussed using nicotine gum.  She is still on statin and beta-blocker/ACE/aspirin/Plavix at baseline.  Would continue baseline medications for now.  Rationale discussed with patient.  Unclear if her knee pain is coming from a separate issue with arthritis.  See notes on plain films.  We talked about getting follow-up imaging of her carotids done.  Ordered.  I think it makes sense to get all of the follow-up testing done first and go from there.  She does not have calf cramping with walking.  No chest pain. At least 30 minutes were devoted to patient care in this encounter (this can potentially include time spent reviewing the patient's file/history, interviewing and examining the patient, counseling/reviewing plan with patient, ordering referrals, ordering tests, reviewing relevant laboratory or x-ray data, and documenting the encounter).

## 2019-04-05 NOTE — ED Provider Notes (Signed)
[facility] EMERGENCY DEPARTMENT Provider Note   CSN: [id number] Arrival date & time: 04/05/19  1449     History Chief Complaint  Patient presents with   Rectal Bleeding    Jill Franco is a 85 y.o. female.  HPI 85 year old Caucasian female past medical history significant for hyperlipidemia, hypertension, CVA, peripheral vascular disease who presents to the emergency department today for evaluation of lower GI bleed.  Patient reports 2 episodes of bright red blood since yesterday.  Reports generalized abdominal pain with the bowel movements.  Denies any nausea or vomiting.  No fevers or chills.  No urinary symptoms.  Patient reports taking aspirin and Plavix.  No history of GI bleed.  She has never had a colonoscopy.  No family history of colon cancer.  No history of diverticulitis.  Patient denies any current abdominal pain at this time.  Has taken no medications for pain.  Denies any constipation or rectal pain with bowel movements.  Denies chest pain, shortness of breath, lightheadedness or dizziness.    Past Medical History:  Diagnosis Date   Cataract    CVA (cerebral vascular accident) ([place])    Double vision    Facial droop 10/2017   left   History of shingles 2012   HLD (hyperlipidemia)    HTN (hypertension)    Hypothyroidism    PVD (peripheral vascular disease) ([place])    Vision loss    temporary    Patient Active Problem List   Diagnosis Date Noted   Medicare annual wellness visit, subsequent 09/12/2018   CVA (cerebral vascular accident) ([place]) 11/27/2017   Vision loss 11/21/2017   Double vision 08/16/2016   PAD (peripheral artery disease) ([place]) 03/18/2016   Peripheral vascular disease ([place]) 01/11/2016   Back pain 01/03/2016   Claudication ([place]) 01/03/2016   Paresthesia 01/03/2016   Hair loss 10/01/2015   Colon cancer screening 08/06/2015   Allergic rhinitis 03/20/2015   Advance care planning 08/04/2014   Smoking  08/04/2014   Healthcare maintenance 06/07/2011   Herpes zoster 03/19/2010   Hypothyroidism 08/23/2006   HLD (hyperlipidemia) 08/23/2006   HYPERTENSION, BENIGN ESSENTIAL 07/18/2006    Past Surgical History:  Procedure Laterality Date   ABDOMINAL EXPLORATION SURGERY  1950s   "they thought I was pregnant; went in to explore"   APPENDECTOMY  childhood   BACK SURGERY     BREAST LUMPECTOMY Left 1960's   benign   carotid ultrasound  06/19/2007   0-39% stenosis   CATARACT EXTRACTION W/ INTRAOCULAR LENS IMPLANT Left 09/2016   CATARACT EXTRACTION W/ INTRAOCULAR LENS IMPLANT Right 10/2016   DILATION AND CURETTAGE OF UTERUS  1960's X 3   miscarriages   DOPPLER ECHOCARDIOGRAPHY  06/19/2007   Normal EF 55-70%   ILIAC ARTERY STENT Left 03/18/2016   common iliac stent (6 x 40), aortic stent (10 x 19)/notes 03/18/2016   LAMINECTOMY AND MICRODISCECTOMY SPINE  1960's X 2   MOLE REMOVAL  07/10/2016   NSVD     x1   PERIPHERAL VASCULAR CATHETERIZATION N/A 03/18/2016   Procedure: Abdominal Aortogram w/Lower Extremity;  Surgeon: Elam Dutch, MD;  Location: [facility];  Service: Cardiovascular;  Laterality: N/A;   PERIPHERAL VASCULAR CATHETERIZATION  03/18/2016   Procedure: Peripheral Vascular Intervention;  Surgeon: Elam Dutch, MD;  Location: [facility];  Service: Cardiovascular;;     OB History   No obstetric history on file.     Family History  Problem Relation Age of Onset  Stroke Father 43   Cancer Brother    Colon cancer Neg Hx    Breast cancer Neg Hx     Social History   Tobacco Use   Smoking status: Current Every Day Smoker    Packs/day: 0.33    Years: 62.00    Pack years: 20.46    Types: Cigarettes   Smokeless tobacco: Never Used  Substance Use Topics   Alcohol use: No    Alcohol/week: 0.0 standard drinks   Drug use: No    Home Medications Prior to Admission medications   Medication Sig Start Date End Date Taking?  Authorizing Provider  aspirin 81 MG EC tablet Take 81 mg by mouth 2 (two) times daily.     [provider]  cetirizine (ZYRTEC) 10 MG tablet Take 10 mg by mouth as needed for allergies.    [provider]  clopidogrel (PLAVIX) 75 MG tablet TAKE 1 TABLET BY MOUTH  DAILY 01/24/19   Tonia Ghent, MD  enalapril (VASOTEC) 10 MG tablet Take 1.5 tablets (15 mg total) by mouth daily. 12/05/18   Tonia Ghent, MD  fluticasone (FLONASE) 50 MCG/ACT nasal spray Place 2 sprays into both nostrils daily. 09/10/18   Tonia Ghent, MD  hydrochlorothiazide (MICROZIDE) 12.5 MG capsule Take 1 capsule (12.5 mg total) by mouth every morning. 09/20/18   Tonia Ghent, MD  levothyroxine (SYNTHROID) 50 MCG tablet TAKE 1 TABLET BY MOUTH  DAILY EXCEPT TAKE 2 TABLETS ON SUNDAYS AND WEDNESDAYS. (TOTAL 9  TABLETS PER WEEK) 09/20/18   Tonia Ghent, MD  Liniments (BLUE-EMU SUPER STRENGTH EX) Apply 1 application topically as needed (knee pain).    [provider]  metoprolol tartrate (LOPRESSOR) 100 MG tablet Take 1/2 tablet by mouth in the morning and 1 tablet in the evening. 09/20/18   Tonia Ghent, MD  pravastatin (PRAVACHOL) 10 MG tablet Take 1 tablet (10 mg total) by mouth daily. 09/20/18   Tonia Ghent, MD    Allergies    Aspirin, Codeine, and Lipitor [atorvastatin]  Review of Systems   Review of Systems  Constitutional: Negative for chills and fever.  HENT: Negative for congestion.   Eyes: Negative for discharge.  Respiratory: Negative for shortness of breath.   Cardiovascular: Negative for chest pain.  Gastrointestinal: Positive for abdominal pain and blood in stool. Negative for constipation, diarrhea, nausea and vomiting.  Musculoskeletal: Negative for myalgias.  Skin: Negative for color change.  Neurological: Negative for dizziness, syncope, weakness, light-headedness and headaches.  Psychiatric/Behavioral: Negative for confusion.    Physical Exam Updated Vital  Signs BP (!) 196/82    Pulse 66    Temp 98.1 F (36.7 C) (Oral)    Resp 20    Ht 5\' 4"  (1.626 m)    Wt 49.4 kg    SpO2 98%    BMI 18.71 kg/m   Physical Exam Vitals and nursing note reviewed.  Constitutional:      General: She is not in acute distress.    Appearance: She is well-developed. She is not toxic-appearing.  HENT:     Head: Normocephalic and atraumatic.     Nose: Nose normal.  Eyes:     General:        Right eye: No discharge.        Left eye: No discharge.     Conjunctiva/sclera: Conjunctivae normal.     Pupils: Pupils are equal, round, and reactive to light.  Cardiovascular:     Rate  and Rhythm: Normal rate and regular rhythm.     Heart sounds: Normal heart sounds.  Pulmonary:     Effort: Pulmonary effort is normal. No respiratory distress.     Breath sounds: Normal breath sounds.  Chest:     Chest wall: No tenderness.  Abdominal:     General: Abdomen is flat. Bowel sounds are normal. There is no distension.     Palpations: Abdomen is soft.     Tenderness: There is no abdominal tenderness. There is no guarding or rebound.  Genitourinary:    Comments: Chaperone present for exam. Pt tolerated without difficulty. No external hemorrhoids or fissures noted. No pain with palpation of the rectal vault. No internal hemorrhoids noted. Soft brown stool noted in the rectal vault. gross hematochezia. Hemoccult positive.  Musculoskeletal:        General: No tenderness. Normal range of motion.     Cervical back: Normal range of motion and neck supple.  Lymphadenopathy:     Cervical: No cervical adenopathy.  Skin:    General: Skin is warm and dry.     Capillary Refill: Capillary refill takes less than 2 seconds.  Neurological:     Mental Status: She is alert and oriented to person, place, and time.  Psychiatric:        Behavior: Behavior normal.        Thought Content: Thought content normal.        Judgment: Judgment normal.     ED Results / Procedures / Treatments     Labs (all labs ordered are listed, but only abnormal results are displayed) Labs Reviewed  COMPREHENSIVE METABOLIC PANEL - Abnormal; Notable for the following components:      Result Value   Sodium 129 (*)    All other components within normal limits  OCCULT BLOOD X 1 CARD TO LAB, STOOL - Abnormal; Notable for the following components:   Fecal Occult Bld POSITIVE (*)    All other components within normal limits  CBC WITH DIFFERENTIAL/PLATELET  PROTIME-INR  APTT    EKG None  Radiology CT ABDOMEN PELVIS W CONTRAST  Result Date: 04/05/2019 CLINICAL DATA:  Non localized abdominal pain and hematochezia. EXAM: CT ABDOMEN AND PELVIS WITH CONTRAST TECHNIQUE: Multidetector CT imaging of the abdomen and pelvis was performed using the standard protocol following bolus administration of intravenous contrast. CONTRAST:  131mL OMNIPAQUE IOHEXOL 300 MG/ML  SOLN COMPARISON:  None. FINDINGS: Lower chest: 8.5 mm left lower lobe pulmonary nodule is indeterminate. No other pulmonary lesions are identified. No infiltrates or effusions. The heart is normal in size. No pericardial effusion. Moderate pectus deformity with mass effect on the right ventricle. Coronary artery and aortic calcifications are noted. Hepatobiliary: No focal hepatic lesions or intrahepatic biliary dilatation. The gallbladder appears normal. No common bile duct dilatation. Pancreas: No mass, inflammation or ductal dilatation. Spleen: Normal size. No focal lesions. Adrenals/Urinary Tract: Mild enlargement of the left adrenal gland but no discrete nodule. The kidneys are unremarkable. No renal, ureteral or bladder calculi or mass. Stomach/Bowel: The stomach, duodenum, small bowel and colon are grossly normal. No acute inflammatory changes, mass lesions or obstructive findings. The terminal ileum is normal. The appendix is surgically absent. Vascular/Lymphatic: Severe atherosclerotic calcifications involving the aorta and iliac arteries and  branch vessels. Aortoiliac stents are noted. The major venous structures are patent. No abdominal or pelvic lymphadenopathy. Reproductive: The uterus and ovaries are unremarkable. Other: No pelvic mass or pelvic adenopathy. No free pelvic fluid collections. No inguinal mass  or adenopathy. No abdominal wall hernia or subcutaneous lesions. Musculoskeletal: Osteoporosis and lumbar degenerative changes but no acute bony findings or worrisome bone lesions. Advanced bilateral hip joint degenerative changes but no AVN. There is a moderate-sized right hip joint effusion noted. IMPRESSION: 1. No acute abdominal/pelvic findings, mass lesions or lymphadenopathy. 2. 8.5 mm left lower lobe pulmonary nodule is indeterminate. Recommend followup noncontrast chest CT in 3 months. 3. Advanced atherosclerotic calcifications involving the aorta and branch vessels. 4. Advanced bilateral hip joint degenerative changes with a moderate-sized right hip joint effusion. Electronically Signed   By: Marijo Sanes M.D.   On: 04/05/2019 16:46    Procedures Procedures (including critical care time)  Medications Ordered in ED Medications  iohexol (OMNIPAQUE) 300 MG/ML solution 100 mL (100 mLs Intravenous Contrast Given 04/05/19 1620)  sodium chloride 0.9 % bolus 500 mL (500 mLs Intravenous New Bag/Given 04/05/19 1707)    ED Course  I have reviewed the triage vital signs and the nursing notes.  Pertinent labs & imaging results that were available during my care of the patient were reviewed by me and considered in my medical decision making (see chart for details).    MDM Rules/Calculators/A&P                      85 year old female presents the ER for evaluation of lower GI bleed with generalized abdominal pain.  Patient very well-appearing on exam.  No tachycardia noted.  No hypotension appreciated.  Rectal exam reveals gross hematochezia and is Hemoccult positive.  No focal abdominal tenderness.  Bowel sounds are present.  Labs  reveal a hemoglobin of 12.8 baseline appears to be 14-16.  Normal coags.  Patient does have a mild hyponatremia of 129.  She seems to be asymptomatic with this.  CT scan shows no acute intra-abdominal pathology.  Patient does have a left lower lobe pulmonary nodule that can be followed up with a noncontrast CT scan in 3 months.  Patient aware of this finding.  Case was discussed with Dr. Loletha Carrow with GI.  Agrees with admission for observation.  Can be consulted by hospital team.  Patient updated on plan of care.  She does remain hemodynamically at this time.  Patient given Protonix.  Case discussed with Dr. Tamala Julian who accepts patient for admission to the hospital service.  Patient to be transferred by EMS.  Patient updated on plan of care.   Final Clinical Impression(s) / ED Diagnoses Final diagnoses:  Lower GI bleed  Lung nodule    Rx / DC Orders ED Discharge Orders    None       Aaron Edelman 04/05/19 1744    Charlesetta Shanks, MD 04/05/19 [phone]

## 2019-04-05 NOTE — Progress Notes (Signed)
85 yo hx HTN, HLD, PVD on DAPT presenting with BRBPR x2.  Bright red blood on rectal exam.  Hemodynamically stable with hypertension.  Hb down from priors.  GI aware, please consult on arrival.  Requested PPI IV BID.  Will admit to [facility] for obs.  Labs notable for hyponatremia.  CT abdomen without acute findings.  8.5 mm LLL pulm nodule that should be followed.  Today's Vitals   04/05/19 1458 04/05/19 1533 04/05/19 1707 04/05/19 1708  BP: (!) 191/85  (!) 196/82   Pulse: 63  66 66  Resp: 20  20   Temp: 98.1 F (36.7 C)     TempSrc: Oral     SpO2: 100%  100% 98%  Weight: 49.4 kg     Height: 5\' 4"  (1.626 m)     PainSc:  0-No pain     Body mass index is 18.71 kg/m.

## 2019-04-05 NOTE — ED Notes (Signed)
Attempted to call report; this RN contact info provided for callback 

## 2019-04-06 DIAGNOSIS — I10 Essential (primary) hypertension: Secondary | ICD-10-CM

## 2019-04-06 DIAGNOSIS — E039 Hypothyroidism, unspecified: Secondary | ICD-10-CM | POA: Diagnosis not present

## 2019-04-06 DIAGNOSIS — R1030 Lower abdominal pain, unspecified: Secondary | ICD-10-CM

## 2019-04-06 DIAGNOSIS — R1084 Generalized abdominal pain: Secondary | ICD-10-CM | POA: Diagnosis not present

## 2019-04-06 DIAGNOSIS — K621 Rectal polyp: Secondary | ICD-10-CM | POA: Diagnosis not present

## 2019-04-06 DIAGNOSIS — D123 Benign neoplasm of transverse colon: Secondary | ICD-10-CM | POA: Diagnosis not present

## 2019-04-06 DIAGNOSIS — I739 Peripheral vascular disease, unspecified: Secondary | ICD-10-CM | POA: Diagnosis present

## 2019-04-06 DIAGNOSIS — E871 Hypo-osmolality and hyponatremia: Secondary | ICD-10-CM | POA: Diagnosis not present

## 2019-04-06 DIAGNOSIS — E785 Hyperlipidemia, unspecified: Secondary | ICD-10-CM

## 2019-04-06 DIAGNOSIS — Z7902 Long term (current) use of antithrombotics/antiplatelets: Secondary | ICD-10-CM | POA: Diagnosis not present

## 2019-04-06 DIAGNOSIS — Z20822 Contact with and (suspected) exposure to covid-19: Secondary | ICD-10-CM | POA: Diagnosis not present

## 2019-04-06 DIAGNOSIS — K921 Melena: Principal | ICD-10-CM

## 2019-04-06 DIAGNOSIS — Z8673 Personal history of transient ischemic attack (TIA), and cerebral infarction without residual deficits: Secondary | ICD-10-CM | POA: Diagnosis not present

## 2019-04-06 DIAGNOSIS — K5520 Angiodysplasia of colon without hemorrhage: Secondary | ICD-10-CM | POA: Diagnosis not present

## 2019-04-06 DIAGNOSIS — Z66 Do not resuscitate: Secondary | ICD-10-CM | POA: Diagnosis not present

## 2019-04-06 DIAGNOSIS — E038 Other specified hypothyroidism: Secondary | ICD-10-CM | POA: Diagnosis not present

## 2019-04-06 DIAGNOSIS — D62 Acute posthemorrhagic anemia: Secondary | ICD-10-CM

## 2019-04-06 DIAGNOSIS — Z79890 Hormone replacement therapy (postmenopausal): Secondary | ICD-10-CM | POA: Diagnosis not present

## 2019-04-06 DIAGNOSIS — Z823 Family history of stroke: Secondary | ICD-10-CM | POA: Diagnosis not present

## 2019-04-06 DIAGNOSIS — F17210 Nicotine dependence, cigarettes, uncomplicated: Secondary | ICD-10-CM | POA: Diagnosis not present

## 2019-04-06 DIAGNOSIS — K922 Gastrointestinal hemorrhage, unspecified: Secondary | ICD-10-CM | POA: Diagnosis present

## 2019-04-06 DIAGNOSIS — Z809 Family history of malignant neoplasm, unspecified: Secondary | ICD-10-CM | POA: Diagnosis not present

## 2019-04-06 DIAGNOSIS — F17200 Nicotine dependence, unspecified, uncomplicated: Secondary | ICD-10-CM

## 2019-04-06 DIAGNOSIS — Z7982 Long term (current) use of aspirin: Secondary | ICD-10-CM | POA: Diagnosis not present

## 2019-04-06 DIAGNOSIS — R911 Solitary pulmonary nodule: Secondary | ICD-10-CM | POA: Diagnosis present

## 2019-04-06 DIAGNOSIS — I700 Atherosclerosis of aorta: Secondary | ICD-10-CM | POA: Diagnosis present

## 2019-04-06 DIAGNOSIS — K635 Polyp of colon: Secondary | ICD-10-CM | POA: Diagnosis not present

## 2019-04-06 LAB — ABO/RH: ABO/RH(D): O NEG

## 2019-04-06 LAB — OSMOLALITY: Osmolality: 280 mOsm/kg (ref 275–295)

## 2019-04-06 LAB — CBC
HCT: 36.9 % (ref 36.0–46.0)
Hemoglobin: 11.8 g/dL — ABNORMAL LOW (ref 12.0–15.0)
MCH: 28.7 pg (ref 26.0–34.0)
MCHC: 32 g/dL (ref 30.0–36.0)
MCV: 89.8 fL (ref 80.0–100.0)
Platelets: 238 10*3/uL (ref 150–400)
RBC: 4.11 MIL/uL (ref 3.87–5.11)
RDW: 12.9 % (ref 11.5–15.5)
WBC: 6.4 10*3/uL (ref 4.0–10.5)
nRBC: 0 % (ref 0.0–0.2)

## 2019-04-06 LAB — TYPE AND SCREEN
ABO/RH(D): O NEG
Antibody Screen: NEGATIVE

## 2019-04-06 LAB — HEMOGLOBIN AND HEMATOCRIT, BLOOD
HCT: 36.5 % (ref 36.0–46.0)
Hemoglobin: 11.4 g/dL — ABNORMAL LOW (ref 12.0–15.0)

## 2019-04-06 LAB — SARS CORONAVIRUS 2 (TAT 6-24 HRS): SARS Coronavirus 2: NEGATIVE

## 2019-04-06 MED ORDER — PRAVASTATIN SODIUM 20 MG PO TABS
10.0000 mg | ORAL_TABLET | Freq: Every day | ORAL | Status: DC
  Administered 2019-04-06 – 2019-04-08 (×3): 10 mg via ORAL
  Filled 2019-04-06 (×3): qty 1

## 2019-04-06 MED ORDER — METOPROLOL TARTRATE 50 MG PO TABS: 100.0000 mg | ORAL_TABLET | Freq: Every day | ORAL | Status: DC

## 2019-04-06 MED ORDER — NICOTINE 14 MG/24HR TD PT24
14.0000 mg | MEDICATED_PATCH | Freq: Every day | TRANSDERMAL | Status: DC
  Administered 2019-04-06 – 2019-04-08 (×3): 14 mg via TRANSDERMAL
  Filled 2019-04-06 (×3): qty 1

## 2019-04-06 MED ORDER — LEVOTHYROXINE SODIUM 50 MCG PO TABS
50.0000 ug | ORAL_TABLET | ORAL | Status: DC
  Administered 2019-04-06 – 2019-04-08 (×2): 50 ug via ORAL
  Filled 2019-04-06 (×2): qty 1

## 2019-04-06 MED ORDER — ACETAMINOPHEN 325 MG PO TABS
650.0000 mg | ORAL_TABLET | Freq: Four times a day (QID) | ORAL | Status: DC | PRN
  Administered 2019-04-07: 650 mg via ORAL
  Filled 2019-04-06: qty 2

## 2019-04-06 MED ORDER — LEVOTHYROXINE SODIUM 100 MCG PO TABS
100.0000 ug | ORAL_TABLET | ORAL | Status: DC
  Administered 2019-04-07: 100 ug via ORAL
  Filled 2019-04-06: qty 1

## 2019-04-06 MED ORDER — SODIUM CHLORIDE 0.9 % IV SOLN: INTRAVENOUS | Status: DC

## 2019-04-06 MED ORDER — ACETAMINOPHEN 650 MG RE SUPP: 650.0000 mg | Freq: Four times a day (QID) | RECTAL | Status: DC | PRN

## 2019-04-06 MED ORDER — METOPROLOL TARTRATE 50 MG PO TABS
50.0000 mg | ORAL_TABLET | Freq: Every day | ORAL | Status: DC
  Administered 2019-04-06 – 2019-04-08 (×3): 50 mg via ORAL
  Filled 2019-04-06 (×3): qty 1

## 2019-04-06 NOTE — Consult Note (Addendum)
Referring Provider:  [facility]         Primary Care Physician:  Tonia Ghent, MD Primary Gastroenterologist:    unassigned          We were asked to see this patient for:   GI bleed               ASSESSMENT /  PLAN    85 yo female with pmh significant for HTN, hyperlipidemia, hypothyroidism, PVD on DAPT.   #  Hematochezia with mild abdominal pain. On asa + plavix. Hemodynamically stable. Hgb 11.8. No bleeding in 24 hours.  CTAP w/ contrast negative for acute findings and no mention of diverticular disease on CT scan.  -Patient will need colonoscopy, especially given need for DAPT. Last dose of plavix was yesterday so will need to figure out timing ( typically requires 5 day washout).   #  ABL anemia.  Baseline hgb 14-16 >>> 11.8 today. Hgb should remain stable since bleeding has stopped  #  Indeterminate pulmonary nodule on CT scan  #  Advanced atherosclerosis or aorta and branch vessels on CT scan   HPI:    Chief Complaint:  Rectal bleeding  Jill Franco is a 85 y.o. female admitted with two episodes of rectal bleeding, overall considered painless. She had an episode with an otherwise normal BM on Thursday and another on Friday around 9 am. No history of GI bleeding. No history of PUD. No chronic GI complaints. Patient has never had a colonoscopy. No [place] of colon cancer.    Data reviewed:  Hgb 11.8, MVV 90, platelets 238 CMET normal INR 1.0  Past Medical History:  Diagnosis Date  . Cataract   . CVA (cerebral vascular accident) ([place])   . Double vision   . Facial droop 10/2017   left  . History of shingles 2012  . HLD (hyperlipidemia)   . HTN (hypertension)   . Hypothyroidism   . PVD (peripheral vascular disease) ([place])   . Vision loss    temporary    Past Surgical History:  Procedure Laterality Date  . ABDOMINAL EXPLORATION SURGERY  1950s   "they thought I was pregnant; went in to explore"  . APPENDECTOMY  childhood  . BACK SURGERY    .  BREAST LUMPECTOMY Left 1960's   benign  . carotid ultrasound  06/19/2007   0-39% stenosis  . CATARACT EXTRACTION W/ INTRAOCULAR LENS IMPLANT Left 09/2016  . CATARACT EXTRACTION W/ INTRAOCULAR LENS IMPLANT Right 10/2016  . DILATION AND CURETTAGE OF UTERUS  1960's X 3   miscarriages  . DOPPLER ECHOCARDIOGRAPHY  06/19/2007   Normal EF 55-70%  . ILIAC ARTERY STENT Left 03/18/2016   common iliac stent (6 x 40), aortic stent (10 x 19)/notes 03/18/2016  . LAMINECTOMY AND MICRODISCECTOMY SPINE  1960's X 2  . MOLE REMOVAL  07/10/2016  . NSVD     x1  . PERIPHERAL VASCULAR CATHETERIZATION N/A 03/18/2016   Procedure: Abdominal Aortogram w/Lower Extremity;  Surgeon: Elam Dutch, MD;  Location: [place];  Service: Cardiovascular;  Laterality: N/A;  . PERIPHERAL VASCULAR CATHETERIZATION  03/18/2016   Procedure: Peripheral Vascular Intervention;  Surgeon: Elam Dutch, MD;  Location: [place];  Service: Cardiovascular;;    Prior to Admission medications   Medication Sig Start Date End Date Taking? Authorizing Provider  aspirin 81 MG EC tablet Take 81 mg by mouth 2 (two) times daily.    Yes [provider]  cetirizine (ZYRTEC) 10 MG tablet Take 10 mg by mouth as needed for allergies.   Yes [provider]  clopidogrel (PLAVIX) 75 MG tablet TAKE 1 TABLET BY MOUTH  DAILY Patient taking differently: Take 75 mg by mouth daily.  01/24/19  Yes Tonia Ghent, MD  enalapril (VASOTEC) 10 MG tablet Take 1.5 tablets (15 mg total) by mouth daily. 12/05/18  Yes Tonia Ghent, MD  fluticasone [facility]) 50 MCG/ACT nasal spray Place 2 sprays into both nostrils daily. 09/10/18  Yes Tonia Ghent, MD  hydrochlorothiazide (MICROZIDE) 12.5 MG capsule Take 1 capsule (12.5 mg total) by mouth every morning. 09/20/18  Yes Tonia Ghent, MD  levothyroxine (SYNTHROID) 50 MCG tablet TAKE 1 TABLET BY MOUTH  DAILY EXCEPT TAKE 2 TABLETS ON SUNDAYS AND WEDNESDAYS. (TOTAL 9  TABLETS  PER WEEK) Patient taking differently: Take 50 mcg by mouth See admin instructions. TAKE 45mcg BY MOUTH  DAILY EXCEPT TAKE 122mcg  ON SUNDAYS AND WEDNESDAYS. (TOTAL 9  TABLETS PER WEEK) 09/20/18  Yes Tonia Ghent, MD  Liniments (BLUE-EMU SUPER STRENGTH EX) Apply 1 application topically as needed (knee pain).   Yes [provider]  metoprolol tartrate (LOPRESSOR) 100 MG tablet Take 1/2 tablet by mouth in the morning and 1 tablet in the evening. Patient taking differently: Take 50-100 mg by mouth See admin instructions. Take 50mg  in AM and 100 mg in evening. 09/20/18  Yes Tonia Ghent, MD  pravastatin (PRAVACHOL) 10 MG tablet Take 1 tablet (10 mg total) by mouth daily. 09/20/18  Yes Tonia Ghent, MD    Current Facility-Administered Medications  Medication Dose Route Frequency Provider Last Rate Last Admin  . 0.9 %  sodium chloride infusion   Intravenous Continuous Shela Leff, MD 125 mL/hr at 04/06/19 0930 New Bag at 04/06/19 0930  . acetaminophen (TYLENOL) tablet 650 mg  650 mg Oral Q6H PRN Shela Leff, MD       Or  . acetaminophen (TYLENOL) suppository 650 mg  650 mg Rectal Q6H PRN Shela Leff, MD      . Derrill Memo ON 04/07/2019] levothyroxine (SYNTHROID) tablet 100 mcg  100 mcg Oral Once per day on Sun Wed Powell, Kendra Opitz., MD      . levothyroxine (SYNTHROID) tablet 50 mcg  50 mcg Oral Once per day on Mon Tue Thu Fri Sat Shela Leff, MD   50 mcg at 04/06/19 0604  . metoprolol tartrate (LOPRESSOR) tablet 50 mg  50 mg Oral Daily Shela Leff, MD      . nicotine (NICODERM CQ - dosed in mg/24 hours) patch 14 mg  14 mg Transdermal Daily Shela Leff, MD   14 mg at 04/06/19 0736  . pantoprazole (PROTONIX) injection 40 mg  40 mg Intravenous Q12H Shela Leff, MD   40 mg at 04/06/19 0924  . pravastatin (PRAVACHOL) tablet 10 mg  10 mg Oral Daily Shela Leff, MD        Allergies as of 04/05/2019 - Review Complete 04/05/2019   Allergen Reaction Noted  . Aspirin Other (See Comments) 06/14/2007  . Codeine Nausea Only and Other (See Comments) 07/18/2006  . Lipitor [atorvastatin] Other (See Comments) 01/19/2016    Family History  Problem Relation Age of Onset  . Stroke Father 35  . Cancer Brother   . Colon cancer Neg Hx   . Breast cancer Neg Hx     Social History   Socioeconomic History  . Marital status: Widowed    Spouse name: Not  on file  . Number of children: 1  . Years of education: Not on file  . Highest education level: Not on file  Occupational History  . 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 Smoking status: Current Every Day Smoker    Packs/day: 0.33    Years: 62.00    Pack years: 20.46    Types: Cigarettes  . Smokeless tobacco: Never Used  Substance and Sexual Activity  . Alcohol use: No    Alcohol/week: 0.0 standard drinks  . Drug use: No  . Sexual activity: Not on file  Other Topics Concern  . Not on file  Social History Narrative   From [employer]   Retired 2007   Widowed 2008 after 49 years, lives alone.     Enjoys yard work, but needs some help with yard work now.     Her daughter is helping at home some (some as of 2019)   Social Determinants of Health   Financial Resource Strain:   . Difficulty of Paying Living Expenses: Not on file  Food Insecurity:   . Worried About Charity fundraiser in the Last Year: Not on file  . Ran Out of Food in the Last Year: Not on file  Transportation Needs:   . Lack of Transportation (Medical): Not on file  . Lack of Transportation (Non-Medical): Not on file  Physical Activity:   . Days of Exercise per Week: Not on file  . Minutes of Exercise per Session: Not on file  Stress:   . Feeling of Stress : Not on file  Social Connections:   . Frequency of Communication with Friends and Family: Not on file  . Frequency of Social Gatherings with Friends and Family: Not on file  . Attends Religious  Services: Not on file  . Active Member of Clubs or Organizations: Not on file  . Attends Archivist Meetings: Not on file  . Marital Status: Not on file  Intimate Partner Violence:   . Fear of Current or Ex-Partner: Not on file  . Emotionally Abused: Not on file  . Physically Abused: Not on file  . Sexually Abused: Not on file    Review of Systems: All systems reviewed and negative except where noted in HPI.  Physical Exam: Vital signs in last 24 hours: Temp:  [97.8 F (36.6 C)-98.5 F (36.9 C)] 97.8 F (36.6 C) (02/13 0704) Pulse Rate:  [63-79] 70 (02/13 0704) Resp:  [16-22] 20 (02/13 0704) BP: (145-207)/(60-97) 145/67 (02/13 0704) SpO2:  [95 %-100 %] 99 % (02/13 0704) Weight:  [49.4 kg] 49.4 kg (02/12 1458) Last BM Date: 04/05/19 General:   Alert, well-developed,  female in NAD Psych:  Pleasant, cooperative. Normal mood and affect. Eyes:  Pupils equal, sclera clear, no icterus.   Conjunctiva pink. Ears:  Normal auditory acuity. Nose:  No deformity, discharge,  or lesions. Neck:  Supple; no masses Lungs:  Clear throughout to auscultation.   No wheezes, crackles, or rhonchi.  Heart:  Regular rate and rhythm; no murmurs, no lower extremity edema Abdomen:  Soft, non-distended, nontender, BS active, no palp mass   Rectal:  Deferred  Msk:  Symmetrical without gross deformities. . Neurologic:  Alert and  oriented x4;  grossly normal neurologically. Skin:  Intact without significant lesions or rashes.   Intake/Output from previous day: 02/12 0701 - 02/13 0700 In: 1066.7 [P.O.:30; I.V.:536.4; IV Piggyback:500.3] Out: -  Intake/Output this shift: No intake/output data recorded.  Lab Results: Recent Labs    04/05/19 1511 04/06/19 0136  WBC 6.3 6.4  HGB 12.8 11.8*  HCT 40.4 36.9  PLT 274 238   BMET Recent Labs    04/05/19 1511  NA 129*  K 4.0  CL 98  CO2 26  GLUCOSE 98  BUN 15  CREATININE 0.63  CALCIUM 8.9   LFT Recent Labs    04/05/19 1511   PROT 6.9  ALBUMIN 3.8  AST 23  ALT 17  ALKPHOS 82  BILITOT 0.7   PT/INR Recent Labs    04/05/19 1511  LABPROT 13.1  INR 1.0   Hepatitis Panel No results for input(s): HEPBSAG, HCVAB, HEPAIGM, HEPBIGM in the last 72 hours.   . CBC Latest Ref Rng & Units 04/06/2019 04/05/2019 08/17/2017  WBC 4.0 - 10.5 K/uL 6.4 6.3 7.7  Hemoglobin 12.0 - 15.0 g/dL 11.8(L) 12.8 16.7(H)  Hematocrit 36.0 - 46.0 % 36.9 40.4 49.6(H)  Platelets 150 - 400 K/uL 238 274 206.0    . CMP Latest Ref Rng & Units 04/05/2019 09/10/2018 04/27/2018  Glucose 70 - 99 mg/dL 98 89 -  BUN 8 - 23 mg/dL 15 18 -  Creatinine 0.44 - 1.00 mg/dL 0.63 0.79 -  Sodium 135 - 145 mmol/L 129(L) 133(L) -  Potassium 3.5 - 5.1 mmol/L 4.0 4.1 -  Chloride 98 - 111 mmol/L 98 95(L) -  CO2 22 - 32 mmol/L 26 30 -  Calcium 8.9 - 10.3 mg/dL 8.9 9.4 -  Total Protein 6.5 - 8.1 g/dL 6.9 - 6.9  Total Bilirubin 0.3 - 1.2 mg/dL 0.7 - 0.4  Alkaline Phos 38 - 126 U/L 82 - 81  AST 15 - 41 U/L 23 - 20  ALT 0 - 44 U/L 17 - 19   Studies/Results: CT ABDOMEN PELVIS W CONTRAST  Result Date: 04/05/2019 CLINICAL DATA:  Non localized abdominal pain and hematochezia. EXAM: CT ABDOMEN AND PELVIS WITH CONTRAST TECHNIQUE: Multidetector CT imaging of the abdomen and pelvis was performed using the standard protocol following bolus administration of intravenous contrast. CONTRAST:  134mL OMNIPAQUE IOHEXOL 300 MG/ML  SOLN COMPARISON:  None. FINDINGS: Lower chest: 8.5 mm left lower lobe pulmonary nodule is indeterminate. No other pulmonary lesions are identified. No infiltrates or effusions. The heart is normal in size. No pericardial effusion. Moderate pectus deformity with mass effect on the right ventricle. Coronary artery and aortic calcifications are noted. Hepatobiliary: No focal hepatic lesions or intrahepatic biliary dilatation. The gallbladder appears normal. No common bile duct dilatation. Pancreas: No mass, inflammation or ductal dilatation. Spleen: Normal  size. No focal lesions. Adrenals/Urinary Tract: Mild enlargement of the left adrenal gland but no discrete nodule. The kidneys are unremarkable. No renal, ureteral or bladder calculi or mass. Stomach/Bowel: The stomach, duodenum, small bowel and colon are grossly normal. No acute inflammatory changes, mass lesions or obstructive findings. The terminal ileum is normal. The appendix is surgically absent. Vascular/Lymphatic: Severe atherosclerotic calcifications involving the aorta and iliac arteries and branch vessels. Aortoiliac stents are noted. The major venous structures are patent. No abdominal or pelvic lymphadenopathy. Reproductive: The uterus and ovaries are unremarkable. Other: No pelvic mass or pelvic adenopathy. No free pelvic fluid collections. No inguinal mass or adenopathy. No abdominal wall hernia or subcutaneous lesions. Musculoskeletal: Osteoporosis and lumbar degenerative changes but no acute bony findings or worrisome bone lesions. Advanced bilateral hip joint degenerative changes but no AVN. There is a moderate-sized right hip joint effusion noted. IMPRESSION: 1. No acute abdominal/pelvic findings, mass lesions  or lymphadenopathy. 2. 8.5 mm left lower lobe pulmonary nodule is indeterminate. Recommend followup noncontrast chest CT in 3 months. 3. Advanced atherosclerotic calcifications involving the aorta and branch vessels. 4. Advanced bilateral hip joint degenerative changes with a moderate-sized right hip joint effusion. Electronically Signed   By: Marijo Sanes M.D.   On: 04/05/2019 16:46    Principal Problem:   GI bleed Active Problems:   Hypothyroidism   HLD (hyperlipidemia)   HYPERTENSION, BENIGN ESSENTIAL   Smoking    Tye Savoy, NP-C @  04/06/2019, 12:05 PM  I have reviewed the entire case in detail with the above APP and discussed the plan in detail.  Therefore, I agree with the diagnoses recorded above. In addition,  I have personally interviewed and examined the patient  and have personally reviewed any abdominal/pelvic CT scan images.  My additional thoughts are as follows:  She looks comfortable, says abdominal pain resolved.  No further bleeding at this point.  Suspect possible diverticular bleeding (though no obvious diverticuli on CT scan), AVM, or ischemic colitis in patient with smoking history, atherosclerotic disease, pain and bleeding.  Colonoscopy the day after tomorrow.  She was agreeable after discussion of procedure and risks.  The benefits and risks of the planned procedure were described in detail with the patient or (when appropriate) their health care proxy.  Risks were outlined as including, but not limited to, bleeding, infection, perforation, adverse medication reaction leading to cardiac or pulmonary decompensation, pancreatitis (if ERCP).  The limitation of incomplete mucosal visualization was also discussed.  No guarantees or warranties were given.  Patient at increased risk for cardiopulmonary complications of procedure due to medical comorbidities.  She will only been off Plavix 3 3 days by then, but I think the risk is acceptable, even if biopsies are necessary. We will place orders tomorrow, procedure will be done with either Dr. Ardis Hughs or Cirigliano. Jill Franco Office:[phone]

## 2019-04-06 NOTE — Progress Notes (Addendum)
Same day note  Patient seen and examined at bedside.  Patient was admitted to the hospital for hematochezia  At the time of my evaluation, patient complains of no further hematochezia since yesterday.  Denies abdominal pain.  Physical examination reveals: Awake and oriented female.  No abdominal tenderness.  Laboratory data and imaging was reviewed  Assessment and Plan.  Hematochezia.  FOBT positive.  Off dual antiplatelets.  No hematochezia since yesterday.  GI has been consulted.  Continue Protonix.  Follow GI recommendations.  Currently n.p.o.  Mild hyponatremia.  Hold thiazide diuretic.  On normal saline.  Will decrease the fluid rate..  Check BMP in a.m.  Blood osmolality of 280.  Sodium of 129.  Incidental pulmonary nodule.  8.5 mm left lower lobe.  She did have history of tobacco abuse.  To follow-up with noncontrast CT in 3 months as outpatient.  Nicotine use.  Continue nicotine patch.  Essential hypertension.  On metoprolol.  Hypothyroidism.  Continue Synthroid.  Hyperlipidemia.  Continue pravastatin.  Peripheral vascular disease on dual antiplatelets as outpatient.  Currently on hold.  Will change to inpatient at this time due to GIB and need for further workup and monitoring.   No Charge  Signed,  Delila Pereyra, MD [facility]

## 2019-04-06 NOTE — H&P (View-Only) (Signed)
Referring Provider:  [facility]         Primary Care Physician:  Tonia Ghent, MD Primary Gastroenterologist:    unassigned          We were asked to see this patient for:   GI bleed               ASSESSMENT /  PLAN    85 yo female with pmh significant for HTN, hyperlipidemia, hypothyroidism, PVD on DAPT.   #  Hematochezia with mild abdominal pain. On asa + plavix. Hemodynamically stable. Hgb 11.8. No bleeding in 24 hours.  CTAP w/ contrast negative for acute findings and no mention of diverticular disease on CT scan.  -Patient will need colonoscopy, especially given need for DAPT. Last dose of plavix was yesterday so will need to figure out timing ( typically requires 5 day washout).   #  ABL anemia.  Baseline hgb 14-16 >>> 11.8 today. Hgb should remain stable since bleeding has stopped  #  Indeterminate pulmonary nodule on CT scan  #  Advanced atherosclerosis or aorta and branch vessels on CT scan   HPI:    Chief Complaint:  Rectal bleeding  Jill Franco is a 85 y.o. female admitted with two episodes of rectal bleeding, overall considered painless. She had an episode with an otherwise normal BM on Thursday and another on Friday around 9 am. No history of GI bleeding. No history of PUD. No chronic GI complaints. Patient has never had a colonoscopy. No [place] of colon cancer.    Data reviewed:  Hgb 11.8, MVV 90, platelets 238 CMET normal INR 1.0  Past Medical History:  Diagnosis Date  . Cataract   . CVA (cerebral vascular accident) ([place])   . Double vision   . Facial droop 10/2017   left  . History of shingles 2012  . HLD (hyperlipidemia)   . HTN (hypertension)   . Hypothyroidism   . PVD (peripheral vascular disease) ([place])   . Vision loss    temporary    Past Surgical History:  Procedure Laterality Date  . ABDOMINAL EXPLORATION SURGERY  1950s   "they thought I was pregnant; went in to explore"  . APPENDECTOMY  childhood  . BACK SURGERY    .  BREAST LUMPECTOMY Left 1960's   benign  . carotid ultrasound  06/19/2007   0-39% stenosis  . CATARACT EXTRACTION W/ INTRAOCULAR LENS IMPLANT Left 09/2016  . CATARACT EXTRACTION W/ INTRAOCULAR LENS IMPLANT Right 10/2016  . DILATION AND CURETTAGE OF UTERUS  1960's X 3   miscarriages  . DOPPLER ECHOCARDIOGRAPHY  06/19/2007   Normal EF 55-70%  . ILIAC ARTERY STENT Left 03/18/2016   common iliac stent (6 x 40), aortic stent (10 x 19)/notes 03/18/2016  . LAMINECTOMY AND MICRODISCECTOMY SPINE  1960's X 2  . MOLE REMOVAL  07/10/2016  . NSVD     x1  . PERIPHERAL VASCULAR CATHETERIZATION N/A 03/18/2016   Procedure: Abdominal Aortogram w/Lower Extremity;  Surgeon: Elam Dutch, MD;  Location: [facility];  Service: Cardiovascular;  Laterality: N/A;  . PERIPHERAL VASCULAR CATHETERIZATION  03/18/2016   Procedure: Peripheral Vascular Intervention;  Surgeon: Elam Dutch, MD;  Location: [place];  Service: Cardiovascular;;    Prior to Admission medications   Medication Sig Start Date End Date Taking? Authorizing Provider  aspirin 81 MG EC tablet Take 81 mg by mouth 2 (two) times daily.    Yes [provider]  cetirizine (ZYRTEC) 10 MG tablet Take 10 mg by mouth as needed for allergies.   Yes [provider]  clopidogrel (PLAVIX) 75 MG tablet TAKE 1 TABLET BY MOUTH  DAILY Patient taking differently: Take 75 mg by mouth daily.  01/24/19  Yes Tonia Ghent, MD  enalapril (VASOTEC) 10 MG tablet Take 1.5 tablets (15 mg total) by mouth daily. 12/05/18  Yes Tonia Ghent, MD  fluticasone [facility]) 50 MCG/ACT nasal spray Place 2 sprays into both nostrils daily. 09/10/18  Yes Tonia Ghent, MD  hydrochlorothiazide (MICROZIDE) 12.5 MG capsule Take 1 capsule (12.5 mg total) by mouth every morning. 09/20/18  Yes Tonia Ghent, MD  levothyroxine (SYNTHROID) 50 MCG tablet TAKE 1 TABLET BY MOUTH  DAILY EXCEPT TAKE 2 TABLETS ON SUNDAYS AND WEDNESDAYS. (TOTAL 9  TABLETS  PER WEEK) Patient taking differently: Take 50 mcg by mouth See admin instructions. TAKE 73mcg BY MOUTH  DAILY EXCEPT TAKE 153mcg  ON SUNDAYS AND WEDNESDAYS. (TOTAL 9  TABLETS PER WEEK) 09/20/18  Yes Tonia Ghent, MD  Liniments (BLUE-EMU SUPER STRENGTH EX) Apply 1 application topically as needed (knee pain).   Yes [provider]  metoprolol tartrate (LOPRESSOR) 100 MG tablet Take 1/2 tablet by mouth in the morning and 1 tablet in the evening. Patient taking differently: Take 50-100 mg by mouth See admin instructions. Take 50mg  in AM and 100 mg in evening. 09/20/18  Yes Tonia Ghent, MD  pravastatin (PRAVACHOL) 10 MG tablet Take 1 tablet (10 mg total) by mouth daily. 09/20/18  Yes Tonia Ghent, MD    Current Facility-Administered Medications  Medication Dose Route Frequency Provider Last Rate Last Admin  . 0.9 %  sodium chloride infusion   Intravenous Continuous Shela Leff, MD 125 mL/hr at 04/06/19 0930 New Bag at 04/06/19 0930  . acetaminophen (TYLENOL) tablet 650 mg  650 mg Oral Q6H PRN Shela Leff, MD       Or  . acetaminophen (TYLENOL) suppository 650 mg  650 mg Rectal Q6H PRN Shela Leff, MD      . Derrill Memo ON 04/07/2019] levothyroxine (SYNTHROID) tablet 100 mcg  100 mcg Oral Once per day on Sun Wed Powell, Kendra Opitz., MD      . levothyroxine (SYNTHROID) tablet 50 mcg  50 mcg Oral Once per day on Mon Tue Thu Fri Sat Shela Leff, MD   50 mcg at 04/06/19 0604  . metoprolol tartrate (LOPRESSOR) tablet 50 mg  50 mg Oral Daily Shela Leff, MD      . nicotine (NICODERM CQ - dosed in mg/24 hours) patch 14 mg  14 mg Transdermal Daily Shela Leff, MD   14 mg at 04/06/19 0736  . pantoprazole (PROTONIX) injection 40 mg  40 mg Intravenous Q12H Shela Leff, MD   40 mg at 04/06/19 0924  . pravastatin (PRAVACHOL) tablet 10 mg  10 mg Oral Daily Shela Leff, MD        Allergies as of 04/05/2019 - Review Complete 04/05/2019   Allergen Reaction Noted  . Aspirin Other (See Comments) 06/14/2007  . Codeine Nausea Only and Other (See Comments) 07/18/2006  . Lipitor [atorvastatin] Other (See Comments) 01/19/2016    Family History  Problem Relation Age of Onset  . Stroke Father 52  . Cancer Brother   . Colon cancer Neg Hx   . Breast cancer Neg Hx     Social History   Socioeconomic History  . Marital status: Widowed    Spouse name: Not  on file  . Number of children: 1  . Years of education: Not on file  . Highest education level: Not on file  Occupational History  . 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 Smoking status: Current Every Day Smoker    Packs/day: 0.33    Years: 62.00    Pack years: 20.46    Types: Cigarettes  . Smokeless tobacco: Never Used  Substance and Sexual Activity  . Alcohol use: No    Alcohol/week: 0.0 standard drinks  . Drug use: No  . Sexual activity: Not on file  Other Topics Concern  . Not on file  Social History Narrative   From [employer]   Retired 2007   Widowed 2008 after 49 years, lives alone.     Enjoys yard work, but needs some help with yard work now.     Her daughter is helping at home some (some as of 2019)   Social Determinants of Health   Financial Resource Strain:   . Difficulty of Paying Living Expenses: Not on file  Food Insecurity:   . Worried About Charity fundraiser in the Last Year: Not on file  . Ran Out of Food in the Last Year: Not on file  Transportation Needs:   . Lack of Transportation (Medical): Not on file  . Lack of Transportation (Non-Medical): Not on file  Physical Activity:   . Days of Exercise per Week: Not on file  . Minutes of Exercise per Session: Not on file  Stress:   . Feeling of Stress : Not on file  Social Connections:   . Frequency of Communication with Friends and Family: Not on file  . Frequency of Social Gatherings with Friends and Family: Not on file  . Attends Religious  Services: Not on file  . Active Member of Clubs or Organizations: Not on file  . Attends Archivist Meetings: Not on file  . Marital Status: Not on file  Intimate Partner Violence:   . Fear of Current or Ex-Partner: Not on file  . Emotionally Abused: Not on file  . Physically Abused: Not on file  . Sexually Abused: Not on file    Review of Systems: All systems reviewed and negative except where noted in HPI.  Physical Exam: Vital signs in last 24 hours: Temp:  [97.8 F (36.6 C)-98.5 F (36.9 C)] 97.8 F (36.6 C) (02/13 0704) Pulse Rate:  [63-79] 70 (02/13 0704) Resp:  [16-22] 20 (02/13 0704) BP: (145-207)/(60-97) 145/67 (02/13 0704) SpO2:  [95 %-100 %] 99 % (02/13 0704) Weight:  [49.4 kg] 49.4 kg (02/12 1458) Last BM Date: 04/05/19 General:   Alert, well-developed,  female in NAD Psych:  Pleasant, cooperative. Normal mood and affect. Eyes:  Pupils equal, sclera clear, no icterus.   Conjunctiva pink. Ears:  Normal auditory acuity. Nose:  No deformity, discharge,  or lesions. Neck:  Supple; no masses Lungs:  Clear throughout to auscultation.   No wheezes, crackles, or rhonchi.  Heart:  Regular rate and rhythm; no murmurs, no lower extremity edema Abdomen:  Soft, non-distended, nontender, BS active, no palp mass   Rectal:  Deferred  Msk:  Symmetrical without gross deformities. . Neurologic:  Alert and  oriented x4;  grossly normal neurologically. Skin:  Intact without significant lesions or rashes.   Intake/Output from previous day: 02/12 0701 - 02/13 0700 In: 1066.7 [P.O.:30; I.V.:536.4; IV Piggyback:500.3] Out: -  Intake/Output this shift: No intake/output data recorded.  Lab Results: Recent Labs    04/05/19 1511 04/06/19 0136  WBC 6.3 6.4  HGB 12.8 11.8*  HCT 40.4 36.9  PLT 274 238   BMET Recent Labs    04/05/19 1511  NA 129*  K 4.0  CL 98  CO2 26  GLUCOSE 98  BUN 15  CREATININE 0.63  CALCIUM 8.9   LFT Recent Labs    04/05/19 1511   PROT 6.9  ALBUMIN 3.8  AST 23  ALT 17  ALKPHOS 82  BILITOT 0.7   PT/INR Recent Labs    04/05/19 1511  LABPROT 13.1  INR 1.0   Hepatitis Panel No results for input(s): HEPBSAG, HCVAB, HEPAIGM, HEPBIGM in the last 72 hours.   . CBC Latest Ref Rng & Units 04/06/2019 04/05/2019 08/17/2017  WBC 4.0 - 10.5 K/uL 6.4 6.3 7.7  Hemoglobin 12.0 - 15.0 g/dL 11.8(L) 12.8 16.7(H)  Hematocrit 36.0 - 46.0 % 36.9 40.4 49.6(H)  Platelets 150 - 400 K/uL 238 274 206.0    . CMP Latest Ref Rng & Units 04/05/2019 09/10/2018 04/27/2018  Glucose 70 - 99 mg/dL 98 89 -  BUN 8 - 23 mg/dL 15 18 -  Creatinine 0.44 - 1.00 mg/dL 0.63 0.79 -  Sodium 135 - 145 mmol/L 129(L) 133(L) -  Potassium 3.5 - 5.1 mmol/L 4.0 4.1 -  Chloride 98 - 111 mmol/L 98 95(L) -  CO2 22 - 32 mmol/L 26 30 -  Calcium 8.9 - 10.3 mg/dL 8.9 9.4 -  Total Protein 6.5 - 8.1 g/dL 6.9 - 6.9  Total Bilirubin 0.3 - 1.2 mg/dL 0.7 - 0.4  Alkaline Phos 38 - 126 U/L 82 - 81  AST 15 - 41 U/L 23 - 20  ALT 0 - 44 U/L 17 - 19   Studies/Results: CT ABDOMEN PELVIS W CONTRAST  Result Date: 04/05/2019 CLINICAL DATA:  Non localized abdominal pain and hematochezia. EXAM: CT ABDOMEN AND PELVIS WITH CONTRAST TECHNIQUE: Multidetector CT imaging of the abdomen and pelvis was performed using the standard protocol following bolus administration of intravenous contrast. CONTRAST:  133mL OMNIPAQUE IOHEXOL 300 MG/ML  SOLN COMPARISON:  None. FINDINGS: Lower chest: 8.5 mm left lower lobe pulmonary nodule is indeterminate. No other pulmonary lesions are identified. No infiltrates or effusions. The heart is normal in size. No pericardial effusion. Moderate pectus deformity with mass effect on the right ventricle. Coronary artery and aortic calcifications are noted. Hepatobiliary: No focal hepatic lesions or intrahepatic biliary dilatation. The gallbladder appears normal. No common bile duct dilatation. Pancreas: No mass, inflammation or ductal dilatation. Spleen: Normal  size. No focal lesions. Adrenals/Urinary Tract: Mild enlargement of the left adrenal gland but no discrete nodule. The kidneys are unremarkable. No renal, ureteral or bladder calculi or mass. Stomach/Bowel: The stomach, duodenum, small bowel and colon are grossly normal. No acute inflammatory changes, mass lesions or obstructive findings. The terminal ileum is normal. The appendix is surgically absent. Vascular/Lymphatic: Severe atherosclerotic calcifications involving the aorta and iliac arteries and branch vessels. Aortoiliac stents are noted. The major venous structures are patent. No abdominal or pelvic lymphadenopathy. Reproductive: The uterus and ovaries are unremarkable. Other: No pelvic mass or pelvic adenopathy. No free pelvic fluid collections. No inguinal mass or adenopathy. No abdominal wall hernia or subcutaneous lesions. Musculoskeletal: Osteoporosis and lumbar degenerative changes but no acute bony findings or worrisome bone lesions. Advanced bilateral hip joint degenerative changes but no AVN. There is a moderate-sized right hip joint effusion noted. IMPRESSION: 1. No acute abdominal/pelvic findings, mass lesions  or lymphadenopathy. 2. 8.5 mm left lower lobe pulmonary nodule is indeterminate. Recommend followup noncontrast chest CT in 3 months. 3. Advanced atherosclerotic calcifications involving the aorta and branch vessels. 4. Advanced bilateral hip joint degenerative changes with a moderate-sized right hip joint effusion. Electronically Signed   By: Marijo Sanes M.D.   On: 04/05/2019 16:46    Principal Problem:   GI bleed Active Problems:   Hypothyroidism   HLD (hyperlipidemia)   HYPERTENSION, BENIGN ESSENTIAL   Smoking    Tye Savoy, NP-C @  04/06/2019, 12:05 PM  I have reviewed the entire case in detail with the above APP and discussed the plan in detail.  Therefore, I agree with the diagnoses recorded above. In addition,  I have personally interviewed and examined the patient  and have personally reviewed any abdominal/pelvic CT scan images.  My additional thoughts are as follows:  She looks comfortable, says abdominal pain resolved.  No further bleeding at this point.  Suspect possible diverticular bleeding (though no obvious diverticuli on CT scan), AVM, or ischemic colitis in patient with smoking history, atherosclerotic disease, pain and bleeding.  Colonoscopy the day after tomorrow.  She was agreeable after discussion of procedure and risks.  The benefits and risks of the planned procedure were described in detail with the patient or (when appropriate) their health care proxy.  Risks were outlined as including, but not limited to, bleeding, infection, perforation, adverse medication reaction leading to cardiac or pulmonary decompensation, pancreatitis (if ERCP).  The limitation of incomplete mucosal visualization was also discussed.  No guarantees or warranties were given.  Patient at increased risk for cardiopulmonary complications of procedure due to medical comorbidities.  She will only been off Plavix 3 3 days by then, but I think the risk is acceptable, even if biopsies are necessary. We will place orders tomorrow, procedure will be done with either Dr. Ardis Hughs or Cirigliano. Nelida Meuse III Office:[phone]

## 2019-04-06 NOTE — H&P (Signed)
History and Physical    Jill Franco [id number] DOB: [birth year] DOA: 04/05/2019  PCP: Tonia Ghent, MD Patient coming from: [facility]  Chief Complaint: Bloody bowel movements  HPI: Jill Franco is a 85 y.o. female with medical history significant of hypertension, hyperlipidemia, hypothyroidism, PVD on DAPT presenting for evaluation of bloody bowel movements.  Patient states for the past 2 days she has been having bloody bowel movements.  Each time she notices bright red blood in the toilet.  She has not vomited blood.  She is experiencing slight periumbilical/lower abdominal discomfort each time she has a bowel movement but not otherwise.  Denies prior history of GI bleed.  She has never had a colonoscopy.  She felt lightheaded once while walking yesterday which resolved soon after she rested.  No fall or loss of consciousness.  No chest pain or shortness of breath.  Reports having a chronic cough.  She smokes cigarettes daily.  ED Course: Rectal exam done in the ED showing gross hematochezia and FOBT positive.  Blood pressure elevated.  Not tachycardic.  Afebrile.  Hemoglobin 12.8, previously in the 14-16 range.  Sodium 129, mildly low on previous labs as well.  SARS-CoV-2 PCR test pending.  CT abdomen pelvis showing no acute abdominal/pelvic findings.  Showing an indeterminate 8.5 mm left lower lobe pulmonary nodule.  Follow-up noncontrast chest CT recommended in 3 months.  Advanced atherosclerotic calcifications involving the aorta and branch vessels.  Advanced bilateral hip joint degenerative changes with a moderate sized right hip joint effusion.  ED provider discussed the case with Dr. Loletha Carrow from GI who recommended IV PPI twice daily and official GI consult in the morning.  Patient received hydralazine, IV Protonix, and 500 cc normal saline bolus in the ED.  Review of Systems:  All systems reviewed and apart from history of presenting illness, are negative.  Past Medical History:    Diagnosis Date  . Cataract   . CVA (cerebral vascular accident) ([place])   . Double vision   . Facial droop 10/2017   left  . History of shingles 2012  . HLD (hyperlipidemia)   . HTN (hypertension)   . Hypothyroidism   . PVD (peripheral vascular disease) ([place])   . Vision loss    temporary    Past Surgical History:  Procedure Laterality Date  . ABDOMINAL EXPLORATION SURGERY  1950s   "they thought I was pregnant; went in to explore"  . APPENDECTOMY  childhood  . BACK SURGERY    . BREAST LUMPECTOMY Left 1960's   benign  . carotid ultrasound  06/19/2007   0-39% stenosis  . CATARACT EXTRACTION W/ INTRAOCULAR LENS IMPLANT Left 09/2016  . CATARACT EXTRACTION W/ INTRAOCULAR LENS IMPLANT Right 10/2016  . DILATION AND CURETTAGE OF UTERUS  1960's X 3   miscarriages  . DOPPLER ECHOCARDIOGRAPHY  06/19/2007   Normal EF 55-70%  . ILIAC ARTERY STENT Left 03/18/2016   common iliac stent (6 x 40), aortic stent (10 x 19)/notes 03/18/2016  . LAMINECTOMY AND MICRODISCECTOMY SPINE  1960's X 2  . MOLE REMOVAL  07/10/2016  . NSVD     x1  . PERIPHERAL VASCULAR CATHETERIZATION N/A 03/18/2016   Procedure: Abdominal Aortogram w/Lower Extremity;  Surgeon: Elam Dutch, MD;  Location: [facility];  Service: Cardiovascular;  Laterality: N/A;  . PERIPHERAL VASCULAR CATHETERIZATION  03/18/2016   Procedure: Peripheral Vascular Intervention;  Surgeon: Elam Dutch, MD;  Location: [facility];  Service: Cardiovascular;;  reports that she has been smoking cigarettes. She has a 20.46 pack-year smoking history. She has never used smokeless tobacco. She reports that she does not drink alcohol or use drugs.  Allergies  Allergen Reactions  . Aspirin Other (See Comments)    REACTION: Uncoated Stomach upset on empty stomach  . Codeine Nausea Only and Other (See Comments)    Pill needs to be E.coated  . Lipitor [Atorvastatin] Other (See Comments)    myalgia    Family History  Problem  Relation Age of Onset  . Stroke Father 66  . Cancer Brother   . Colon cancer Neg Hx   . Breast cancer Neg Hx     Prior to Admission medications   Medication Sig Start Date End Date Taking? Authorizing Provider  aspirin 81 MG EC tablet Take 81 mg by mouth 2 (two) times daily.    Yes [provider]  cetirizine (ZYRTEC) 10 MG tablet Take 10 mg by mouth as needed for allergies.   Yes [provider]  clopidogrel (PLAVIX) 75 MG tablet TAKE 1 TABLET BY MOUTH  DAILY Patient taking differently: Take 75 mg by mouth daily.  01/24/19  Yes Tonia Ghent, MD  enalapril (VASOTEC) 10 MG tablet Take 1.5 tablets (15 mg total) by mouth daily. 12/05/18  Yes Tonia Ghent, MD  fluticasone [facility]) 50 MCG/ACT nasal spray Place 2 sprays into both nostrils daily. 09/10/18  Yes Tonia Ghent, MD  hydrochlorothiazide (MICROZIDE) 12.5 MG capsule Take 1 capsule (12.5 mg total) by mouth every morning. 09/20/18  Yes Tonia Ghent, MD  levothyroxine (SYNTHROID) 50 MCG tablet TAKE 1 TABLET BY MOUTH  DAILY EXCEPT TAKE 2 TABLETS ON SUNDAYS AND WEDNESDAYS. (TOTAL 9  TABLETS PER WEEK) Patient taking differently: Take 50 mcg by mouth See admin instructions. TAKE 59mcg BY MOUTH  DAILY EXCEPT TAKE 146mcg  ON SUNDAYS AND WEDNESDAYS. (TOTAL 9  TABLETS PER WEEK) 09/20/18  Yes Tonia Ghent, MD  Liniments (BLUE-EMU SUPER STRENGTH EX) Apply 1 application topically as needed (knee pain).   Yes [provider]  metoprolol tartrate (LOPRESSOR) 100 MG tablet Take 1/2 tablet by mouth in the morning and 1 tablet in the evening. Patient taking differently: Take 50-100 mg by mouth See admin instructions. Take 50mg  in AM and 100 mg in evening. 09/20/18  Yes Tonia Ghent, MD  pravastatin (PRAVACHOL) 10 MG tablet Take 1 tablet (10 mg total) by mouth daily. 09/20/18  Yes Tonia Ghent, MD    Physical Exam: Vitals:   04/05/19 1900 04/05/19 1930 04/05/19 2111 04/05/19 2235  BP: (!) 189/71 (!) 164/97  (!) 207/96 (!) 146/60  Pulse: 64 70 63 79  Resp: 18 19 (!) 22   Temp:   98.5 F (36.9 C)   TempSrc:   Oral   SpO2: 98% 95% 95%   Weight:      Height:        Physical Exam  Constitutional: She is oriented to person, place, and time. She appears well-developed and well-nourished.  HENT:  Head: Normocephalic.  Eyes: Right eye exhibits no discharge. Left eye exhibits no discharge.  Cardiovascular: Normal rate, regular rhythm and intact distal pulses.  Pulmonary/Chest: Effort normal and breath sounds normal. No respiratory distress. She has no wheezes. She has no rales.  Abdominal: Soft. Bowel sounds are normal. She exhibits no distension. There is no abdominal tenderness. There is no guarding.  Musculoskeletal:        General: No edema.  Cervical back: Neck supple.     Comments: No obvious swelling or deformity of the right hip  Neurological: She is alert and oriented to person, place, and time.  Skin: Skin is warm and dry.     Labs on Admission: I have personally reviewed following labs and imaging studies  CBC: Recent Labs  Lab 04/05/19 1511  WBC 6.3  NEUTROABS 3.8  HGB 12.8  HCT 40.4  MCV 91.4  PLT 123456   Basic Metabolic Panel: Recent Labs  Lab 04/05/19 1511  NA 129*  K 4.0  CL 98  CO2 26  GLUCOSE 98  BUN 15  CREATININE 0.63  CALCIUM 8.9   GFR: Estimated Creatinine Clearance: 42.3 mL/min (by C-G formula based on SCr of 0.63 mg/dL). Liver Function Tests: Recent Labs  Lab 04/05/19 1511  AST 23  ALT 17  ALKPHOS 82  BILITOT 0.7  PROT 6.9  ALBUMIN 3.8   No results for input(s): LIPASE, AMYLASE in the last 168 hours. No results for input(s): AMMONIA in the last 168 hours. Coagulation Profile: Recent Labs  Lab 04/05/19 1511  INR 1.0   Cardiac Enzymes: No results for input(s): CKTOTAL, CKMB, CKMBINDEX, TROPONINI in the last 168 hours. BNP (last 3 results) No results for input(s): PROBNP in the last 8760 hours. HbA1C: No results for input(s):  HGBA1C in the last 72 hours. CBG: No results for input(s): GLUCAP in the last 168 hours. Lipid Profile: No results for input(s): CHOL, HDL, LDLCALC, TRIG, CHOLHDL, LDLDIRECT in the last 72 hours. Thyroid Function Tests: No results for input(s): TSH, T4TOTAL, FREET4, T3FREE, THYROIDAB in the last 72 hours. Anemia Panel: No results for input(s): VITAMINB12, FOLATE, FERRITIN, TIBC, IRON, RETICCTPCT in the last 72 hours. Urine analysis: No results found for: COLORURINE, APPEARANCEUR, LABSPEC, PHURINE, GLUCOSEU, HGBUR, BILIRUBINUR, KETONESUR, PROTEINUR, UROBILINOGEN, NITRITE, LEUKOCYTESUR  Radiological Exams on Admission: CT ABDOMEN PELVIS W CONTRAST  Result Date: 04/05/2019 CLINICAL DATA:  Non localized abdominal pain and hematochezia. EXAM: CT ABDOMEN AND PELVIS WITH CONTRAST TECHNIQUE: Multidetector CT imaging of the abdomen and pelvis was performed using the standard protocol following bolus administration of intravenous contrast. CONTRAST:  137mL OMNIPAQUE IOHEXOL 300 MG/ML  SOLN COMPARISON:  None. FINDINGS: Lower chest: 8.5 mm left lower lobe pulmonary nodule is indeterminate. No other pulmonary lesions are identified. No infiltrates or effusions. The heart is normal in size. No pericardial effusion. Moderate pectus deformity with mass effect on the right ventricle. Coronary artery and aortic calcifications are noted. Hepatobiliary: No focal hepatic lesions or intrahepatic biliary dilatation. The gallbladder appears normal. No common bile duct dilatation. Pancreas: No mass, inflammation or ductal dilatation. Spleen: Normal size. No focal lesions. Adrenals/Urinary Tract: Mild enlargement of the left adrenal gland but no discrete nodule. The kidneys are unremarkable. No renal, ureteral or bladder calculi or mass. Stomach/Bowel: The stomach, duodenum, small bowel and colon are grossly normal. No acute inflammatory changes, mass lesions or obstructive findings. The terminal ileum is normal. The appendix  is surgically absent. Vascular/Lymphatic: Severe atherosclerotic calcifications involving the aorta and iliac arteries and branch vessels. Aortoiliac stents are noted. The major venous structures are patent. No abdominal or pelvic lymphadenopathy. Reproductive: The uterus and ovaries are unremarkable. Other: No pelvic mass or pelvic adenopathy. No free pelvic fluid collections. No inguinal mass or adenopathy. No abdominal wall hernia or subcutaneous lesions. Musculoskeletal: Osteoporosis and lumbar degenerative changes but no acute bony findings or worrisome bone lesions. Advanced bilateral hip joint degenerative changes but no AVN. There is a moderate-sized  right hip joint effusion noted. IMPRESSION: 1. No acute abdominal/pelvic findings, mass lesions or lymphadenopathy. 2. 8.5 mm left lower lobe pulmonary nodule is indeterminate. Recommend followup noncontrast chest CT in 3 months. 3. Advanced atherosclerotic calcifications involving the aorta and branch vessels. 4. Advanced bilateral hip joint degenerative changes with a moderate-sized right hip joint effusion. Electronically Signed   By: Marijo Sanes M.D.   On: 04/05/2019 16:46    Assessment/Plan Principal Problem:   GI bleed Active Problems:   Hypothyroidism   HLD (hyperlipidemia)   HYPERTENSION, BENIGN ESSENTIAL   Smoking   Acute lower GI bleed/hematochezia Patient presenting with complaints of hematochezia.  Takes aspirin and Plavix at home for PVD.  No prior colonoscopy results in the chart.  Hemodynamically stable with elevated blood pressure. Rectal exam done in the ED showing gross hematochezia and FOBT positive. Hemoglobin 12.8, previously in the 14-16 range. CT abdomen pelvis showing no acute abdominal/pelvic findings. ED provider discussed the case with Dr. Loletha Carrow from GI who recommended IV PPI twice daily and official GI consult in the morning. -Continue IV PPI twice daily per GI recommendation -Hold aspirin and Plavix -IV fluid  hydration -Type and screen -Monitor H&H closely -Keep n.p.o. except sips with meds -Reconsult GI in a.m.  Hyponatremia Sodium 129, mildly low on previous labs as well.  Likely related to home thiazide diuretic use. -Hold diuretic at this time -IV fluid hydration -Check serum osmolarity -Continue to monitor sodium level closely  Pulmonary nodule CT showing an indeterminate 8.5 mm left lower lobe pulmonary nodule.  Patient is high risk given ongoing tobacco use. -Please ensure follow-up with PCP for noncontrast chest CT in 3 months.  Tobacco use -NicoDerm patch -Counseling  Hip joint effusion CT showing advanced bilateral hip joint degenerative changes with a moderate sized right hip joint effusion.  Patient denies any pain.  No obvious swelling or deformity noted on exam.  No fever or leukocytosis.  No signs of sepsis. -Consult orthopedics in a.m. to discuss arthrocentesis  Hypertension Blood pressure previously elevated in the ED, most recent reading 146/60. -Hold home antihypertensive at this time given acute GI bleed -Hydralazine as needed for SBP greater than 170  Hyperlipidemia -Continue home pravastatin  Hypothyroidism -Continue home Synthroid  PVD on DAPT -Hold aspirin and Plavix in the setting of acute GI bleed  DVT prophylaxis: SCDs at this time Code Status: Patient wishes to be DNR. Family Communication: No family available at this time. Disposition Plan: Anticipate discharge after clinical improvement. Consults called: Gastroenterology Admission status: It is my clinical opinion that referral for OBSERVATION is reasonable and necessary in this patient based on the above information provided. The aforementioned taken together are felt to place the patient at high risk for further clinical deterioration. However it is anticipated that the patient may be medically stable for discharge from the hospital within 24 to 48 hours.  The medical decision making on this  patient was of high complexity and the patient is at high risk for clinical deterioration, therefore this is a level 3 visit.  Shela Leff MD [facility]  If 7PM-7AM, please contact night-coverage [email or web address] Password [email or web address]  04/06/2019, 1:02 AM

## 2019-04-07 LAB — BASIC METABOLIC PANEL
Anion gap: 8 (ref 5–15)
BUN: 7 mg/dL — ABNORMAL LOW (ref 8–23)
CO2: 24 mmol/L (ref 22–32)
Calcium: 8.8 mg/dL — ABNORMAL LOW (ref 8.9–10.3)
Chloride: 104 mmol/L (ref 98–111)
Creatinine, Ser: 0.6 mg/dL (ref 0.44–1.00)
GFR calc Af Amer: >60 mL/min (ref >60)
GFR calc non Af Amer: >60 mL/min (ref >60)
Glucose, Bld: 97 mg/dL (ref 70–99)
Potassium: 3.8 mmol/L (ref 3.5–5.1)
Sodium: 136 mmol/L (ref 135–145)

## 2019-04-07 LAB — CBC
HCT: 36.7 % (ref 36.0–46.0)
Hemoglobin: 11.5 g/dL — ABNORMAL LOW (ref 12.0–15.0)
MCH: 28.5 pg (ref 26.0–34.0)
MCHC: 31.3 g/dL (ref 30.0–36.0)
MCV: 90.8 fL (ref 80.0–100.0)
Platelets: 227 10*3/uL (ref 150–400)
RBC: 4.04 MIL/uL (ref 3.87–5.11)
RDW: 13 % (ref 11.5–15.5)
WBC: 4.9 10*3/uL (ref 4.0–10.5)
nRBC: 0 % (ref 0.0–0.2)

## 2019-04-07 LAB — MAGNESIUM: Magnesium: 2.1 mg/dL (ref 1.7–2.4)

## 2019-04-07 MED ORDER — PEG-KCL-NACL-NASULF-NA ASC-C 100 G PO SOLR: 1.0000 | Freq: Once | ORAL | Status: DC

## 2019-04-07 MED ORDER — PEG-KCL-NACL-NASULF-NA ASC-C 100 G PO SOLR
0.5000 | Freq: Once | ORAL | Status: AC
  Administered 2019-04-08: 100 g via ORAL

## 2019-04-07 MED ORDER — PEG-KCL-NACL-NASULF-NA ASC-C 100 G PO SOLR
0.5000 | Freq: Once | ORAL | Status: AC
  Administered 2019-04-07: 100 g via ORAL
  Filled 2019-04-07: qty 1

## 2019-04-07 NOTE — Telephone Encounter (Signed)
Agree with ER.  Thanks.  

## 2019-04-07 NOTE — Plan of Care (Signed)
Patient tolerating clear liquids, no nausea or abdominal pain.  No bowel movement or bleeding from rectum this shift.  Started colonoscopy prep, verbalizes understanding she is nothing by mouth after midnight.

## 2019-04-07 NOTE — Progress Notes (Signed)
PROGRESS NOTE  Jill Franco [id number] DOB: Jul 24, 1934 DOA: 04/05/2019 PCP: Tonia Ghent, MD   LOS: 1 day   Brief narrative: As per HPI,   Jill Franco is a 85 y.o. female with medical history significant of hypertension, hyperlipidemia, hypothyroidism, PVD on DAPT presented to the hospital for evaluation of bloody bowel movements.  Patient stated that for  2 days she had been having bloody bowel movements. No prior history of GI bleed.  She has never had a colonoscopy.   ED Course: Rectal exam done in the ED showed gross hematochezia and FOBT positive.  Blood pressure was elevated.  Not tachycardic.  Afebrile.  Hemoglobin 12.8, previously in the 14-16 range.  Sodium 129, mildly low on previous labs as well.  SARS-CoV-2 PCR negative.  CT abdomen pelvis showed no acute abdominal/pelvic findings but showing an indeterminate 8.5 mm left lower lobe pulmonary nodule.  ED provider discussed the case with Dr. Loletha Carrow from GI who recommended IV PPI twice daily and GI follow up.  Patient was then admitted to hospital for further evaluation and treatment.  Patient was then admitted to the hospital for further evaluation and treatment.  Assessment/Plan:  Principal Problem:   GI bleed Active Problems:   Hypothyroidism   HLD (hyperlipidemia)   HYPERTENSION, BENIGN ESSENTIAL   Smoking   Hematochezia.  FOBT positive.  Off dual antiplatelets.  No hematochezia since admission. GI on board..  Continue Protonix.  Follow GI recommendations.  Currently on clear liquids.  Mild hyponatremia.  Improved with IV fluids.  Hold thiazide diuretics.   Discontinue IV fluids.  Incidental pulmonary nodule-  8.5 mm left lower lobe.  With history of tobacco tobacco use..  To follow-up with noncontrast CT in 3 months as outpatient.  Nicotine use.  Continue nicotine patch.  Essential hypertension.  On metoprolol.  Closely monitor blood pressure.  Add as needed hydralazine  Hypothyroidism.  Continue  Synthroid.  Hyperlipidemia.  Continue pravastatin.  Peripheral vascular disease on dual antiplatelets as outpatient.  Currently on hold.  VTE Prophylaxis: SCD due to GI bleed  Code Status: DO NOT RESUSCITATE  Family Communication: Spoke with the patient.  Disposition Plan:  . Patient is from home . Likely disposition to home . Barriers to discharge: Pending GI work-up, closer monitoring,   Consultants:  GI  Procedures:  None  Antibiotics:  . None  Anti-infectives (From admission, onward)   None       Subjective: Today, patient was seen and examined at bedside.  She denies any nausea, vomiting or abdominal pain.  Has not had a bowel movement, hematemesis or melena.  Objective: Vitals:   04/06/19 2038 04/07/19 0500  BP: (!) 174/71 (!) 170/70  Pulse: 65 70  Resp: 20 18  Temp: 97.8 F (36.6 C) 98.4 F (36.9 C)  SpO2: 98% 98%    Intake/Output Summary (Last 24 hours) at 04/07/2019 0953 Last data filed at 04/06/2019 1444 Gross per 24 hour  Intake 240 ml  Output --  Net 240 ml   Filed Weights   04/05/19 1458  Weight: 49.4 kg   Body mass index is 18.71 kg/m.   Physical Exam: GENERAL: Patient is alert awake and oriented. Not in obvious distress. HENT: No scleral pallor or icterus. Pupils equally reactive to light. Oral mucosa is moist NECK: is supple, no gross swelling noted. CHEST: Clear to auscultation. No crackles or wheezes.  Diminished breath sounds bilaterally. CVS: S1 and S2 heard, no murmur. Regular rate and rhythm.  ABDOMEN: Soft, non-tender, bowel sounds are present. EXTREMITIES: No edema. CNS: Cranial nerves are intact. No focal motor deficits. SKIN: warm and dry without rashes.  Data Review: I have personally reviewed the following laboratory data and studies,  CBC: Recent Labs  Lab 04/05/19 1511 04/06/19 0136 04/06/19 1712 04/07/19 0558  WBC 6.3 6.4  --  4.9  NEUTROABS 3.8  --   --   --   HGB 12.8 11.8* 11.4* 11.5*  HCT 40.4  36.9 36.5 36.7  MCV 91.4 89.8  --  90.8  PLT 274 238  --  [id number]   Basic Metabolic Panel: Recent Labs  Lab 04/05/19 1511 04/07/19 0558  NA 129* 136  K 4.0 3.8  CL 98 104  CO2 26 24  GLUCOSE 98 97  BUN 15 7*  CREATININE 0.63 0.60  CALCIUM 8.9 8.8*  MG  --  2.1   Liver Function Tests: Recent Labs  Lab 04/05/19 1511  AST 23  ALT 17  ALKPHOS 82  BILITOT 0.7  PROT 6.9  ALBUMIN 3.8   No results for input(s): LIPASE, AMYLASE in the last 168 hours. No results for input(s): AMMONIA in the last 168 hours. Cardiac Enzymes: No results for input(s): CKTOTAL, CKMB, CKMBINDEX, TROPONINI in the last 168 hours. BNP (last 3 results) No results for input(s): BNP in the last 8760 hours.  ProBNP (last 3 results) No results for input(s): PROBNP in the last 8760 hours.  CBG: No results for input(s): GLUCAP in the last 168 hours. Recent Results (from the past 240 hour(s))  SARS CORONAVIRUS 2 (TAT 6-24 HRS) Nasopharyngeal Nasopharyngeal Swab     Status: None   Collection Time: 04/05/19  6:01 PM   Specimen: Nasopharyngeal Swab  Result Value Ref Range Status   SARS Coronavirus 2 NEGATIVE NEGATIVE Final    Comment: (NOTE) SARS-CoV-2 target nucleic acids are NOT DETECTED. The SARS-CoV-2 RNA is generally detectable in upper and lower respiratory specimens during the acute phase of infection. Negative results do not preclude SARS-CoV-2 infection, do not rule out co-infections with other pathogens, and should not be used as the sole basis for treatment or other patient management decisions. Negative results must be combined with clinical observations, patient history, and epidemiological information. The expected result is Negative. Fact Sheet for Patients: SugarRoll.be Fact Sheet for Healthcare Providers: https://www.woods-mathews.com/ This test is not yet approved or cleared by the [place] FDA and  has been authorized for detection and/or  diagnosis of SARS-CoV-2 by FDA under an Emergency Use Authorization (EUA). This EUA will remain  in effect (meaning this test can be used) for the duration of the COVID-19 declaration under Section 56 4(b)(1) of the Act, 21 U.S.C. section 360bbb-3(b)(1), unless the authorization is terminated or revoked sooner. Performed at [facility], [place] [address] [place], [place] [address]      Studies: CT ABDOMEN PELVIS W CONTRAST  Result Date: 04/05/2019 CLINICAL DATA:  Non localized abdominal pain and hematochezia. EXAM: CT ABDOMEN AND PELVIS WITH CONTRAST TECHNIQUE: Multidetector CT imaging of the abdomen and pelvis was performed using the standard protocol following bolus administration of intravenous contrast. CONTRAST:  149mL OMNIPAQUE IOHEXOL 300 MG/ML  SOLN COMPARISON:  None. FINDINGS: Lower chest: 8.5 mm left lower lobe pulmonary nodule is indeterminate. No other pulmonary lesions are identified. No infiltrates or effusions. The heart is normal in size. No pericardial effusion. Moderate pectus deformity with mass effect on the right ventricle. Coronary artery and aortic calcifications are noted. Hepatobiliary: No focal  hepatic lesions or intrahepatic biliary dilatation. The gallbladder appears normal. No common bile duct dilatation. Pancreas: No mass, inflammation or ductal dilatation. Spleen: Normal size. No focal lesions. Adrenals/Urinary Tract: Mild enlargement of the left adrenal gland but no discrete nodule. The kidneys are unremarkable. No renal, ureteral or bladder calculi or mass. Stomach/Bowel: The stomach, duodenum, small bowel and colon are grossly normal. No acute inflammatory changes, mass lesions or obstructive findings. The terminal ileum is normal. The appendix is surgically absent. Vascular/Lymphatic: Severe atherosclerotic calcifications involving the aorta and iliac arteries and branch vessels. Aortoiliac stents are noted. The major venous structures are patent. No abdominal or  pelvic lymphadenopathy. Reproductive: The uterus and ovaries are unremarkable. Other: No pelvic mass or pelvic adenopathy. No free pelvic fluid collections. No inguinal mass or adenopathy. No abdominal wall hernia or subcutaneous lesions. Musculoskeletal: Osteoporosis and lumbar degenerative changes but no acute bony findings or worrisome bone lesions. Advanced bilateral hip joint degenerative changes but no AVN. There is a moderate-sized right hip joint effusion noted. IMPRESSION: 1. No acute abdominal/pelvic findings, mass lesions or lymphadenopathy. 2. 8.5 mm left lower lobe pulmonary nodule is indeterminate. Recommend followup noncontrast chest CT in 3 months. 3. Advanced atherosclerotic calcifications involving the aorta and branch vessels. 4. Advanced bilateral hip joint degenerative changes with a moderate-sized right hip joint effusion. Electronically Signed   By: Marijo Sanes M.D.   On: 04/05/2019 16:46      Flora Lipps, MD  [facility] 04/07/2019

## 2019-04-08 ENCOUNTER — Inpatient Hospital Stay (HOSPITAL_COMMUNITY): Payer: Medicare Other | Admitting: Anesthesiology

## 2019-04-08 ENCOUNTER — Encounter (HOSPITAL_COMMUNITY)
Admission: EM | Disposition: A | Discharge status: Home / Self Care | Payer: Self-pay | Source: Home / Self Care | Attending: Internal Medicine

## 2019-04-08 DIAGNOSIS — K635 Polyp of colon: Secondary | ICD-10-CM

## 2019-04-08 DIAGNOSIS — K621 Rectal polyp: Secondary | ICD-10-CM

## 2019-04-08 HISTORY — PX: POLYPECTOMY: SHX5525

## 2019-04-08 HISTORY — PX: COLONOSCOPY WITH PROPOFOL: SHX5780

## 2019-04-08 LAB — CBC
HCT: 40.5 % (ref 36.0–46.0)
Hemoglobin: 12.7 g/dL (ref 12.0–15.0)
MCH: 28.2 pg (ref 26.0–34.0)
MCHC: 31.4 g/dL (ref 30.0–36.0)
MCV: 90 fL (ref 80.0–100.0)
Platelets: 252 10*3/uL (ref 150–400)
RBC: 4.5 MIL/uL (ref 3.87–5.11)
RDW: 12.8 % (ref 11.5–15.5)
WBC: 5.8 10*3/uL (ref 4.0–10.5)
nRBC: 0 % (ref 0.0–0.2)

## 2019-04-08 LAB — BASIC METABOLIC PANEL
Anion gap: 11 (ref 5–15)
BUN: 7 mg/dL — ABNORMAL LOW (ref 8–23)
CO2: 21 mmol/L — ABNORMAL LOW (ref 22–32)
Calcium: 9 mg/dL (ref 8.9–10.3)
Chloride: 105 mmol/L (ref 98–111)
Creatinine, Ser: 0.58 mg/dL (ref 0.44–1.00)
GFR calc Af Amer: >60 mL/min (ref >60)
GFR calc non Af Amer: >60 mL/min (ref >60)
Glucose, Bld: 98 mg/dL (ref 70–99)
Potassium: 4 mmol/L (ref 3.5–5.1)
Sodium: 137 mmol/L (ref 135–145)

## 2019-04-08 LAB — MAGNESIUM: Magnesium: 1.8 mg/dL (ref 1.7–2.4)

## 2019-04-08 SURGERY — COLONOSCOPY WITH PROPOFOL
Anesthesia: Monitor Anesthesia Care

## 2019-04-08 MED ORDER — PROPOFOL 10 MG/ML IV BOLUS
INTRAVENOUS | Status: DC | PRN
  Administered 2019-04-08 (×2): 20 mg via INTRAVENOUS

## 2019-04-08 MED ORDER — PROPOFOL 500 MG/50ML IV EMUL
INTRAVENOUS | Status: AC
  Filled 2019-04-08: qty 50

## 2019-04-08 MED ORDER — LACTATED RINGERS IV SOLN
INTRAVENOUS | Status: DC
  Administered 2019-04-08: 1000 mL via INTRAVENOUS

## 2019-04-08 MED ORDER — PROPOFOL 500 MG/50ML IV EMUL
INTRAVENOUS | Status: DC | PRN
  Administered 2019-04-08: 75 ug/kg/min via INTRAVENOUS

## 2019-04-08 MED ORDER — CLOPIDOGREL BISULFATE 75 MG PO TABS: 75.0000 mg | ORAL_TABLET | Freq: Every day | ORAL | Status: DC

## 2019-04-08 SURGICAL SUPPLY — 22 items

## 2019-04-08 NOTE — Transfer of Care (Signed)
Immediate Anesthesia Transfer of Care Note  Patient: Jill Franco  Procedure(s) Performed: Procedure(s): COLONOSCOPY WITH PROPOFOL (N/A) POLYPECTOMY  Patient Location: PACU  Anesthesia Type:MAC  Level of Consciousness:  sedated, patient cooperative and responds to stimulation  Airway & Oxygen Therapy:Patient Spontanous Breathing and Patient connected to face mask oxgen  Post-op Assessment:  Report given to PACU RN and Post -op Vital signs reviewed and stable  Post vital signs:  Reviewed and stable  Last Vitals:  Vitals:   04/08/19 1210 04/08/19 1333  BP: (!) 214/91 116/61  Pulse: 86   Resp: 13   Temp: 36.8 C   SpO2: 74%     Complications: No apparent anesthesia complications

## 2019-04-08 NOTE — Anesthesia Preprocedure Evaluation (Addendum)
Anesthesia Evaluation  Patient identified by MRN, date of birth, ID band Patient awake    Reviewed: Allergy & Precautions, NPO status , Patient's Chart, lab work & pertinent test results  History of Anesthesia Complications Negative for: history of anesthetic complications  Airway Mallampati: II  TM Distance: >3 FB Neck ROM: Full    Dental  (+) Edentulous Upper, Edentulous Lower   Pulmonary Current Smoker and Patient abstained from smoking.,    Pulmonary exam normal        Cardiovascular hypertension, Pt. on medications and Pt. on home beta blockers + Peripheral Vascular Disease  Normal cardiovascular exam   '19 Carotid US - 40-59% left ICAS, 1-39% right ICAS    Neuro/Psych  Temporary visual loss Double vision  CVA, No Residual Symptoms negative psych ROS   GI/Hepatic Neg liver ROS,  GI Bleed    Endo/Other  Hypothyroidism   Renal/GU negative Renal ROS     Musculoskeletal negative musculoskeletal ROS (+)   Abdominal   Peds  Hematology negative hematology ROS (+)   Anesthesia Other Findings Covid neg 04/05/19   Reproductive/Obstetrics                            Anesthesia Physical Anesthesia Plan  ASA: III  Anesthesia Plan: MAC   Post-op Pain Management:    Induction: Intravenous  PONV Risk Score and Plan: 1 and Propofol infusion and Treatment may vary due to age or medical condition  Airway Management Planned: Natural Airway and Simple Face Mask  Additional Equipment: None  Intra-op Plan:   Post-operative Plan:   Informed Consent: I have reviewed the patients History and Physical, chart, labs and discussed the procedure including the risks, benefits and alternatives for the proposed anesthesia with the patient or authorized representative who has indicated his/her understanding and acceptance.   Patient has DNR.  Suspend DNR and Discussed DNR with patient.      Plan Discussed with: CRNA and Anesthesiologist  Anesthesia Plan Comments:       Anesthesia Quick Evaluation

## 2019-04-08 NOTE — Anesthesia Postprocedure Evaluation (Signed)
Anesthesia Post Note  Patient: COMFORT IVERSEN  Procedure(s) Performed: COLONOSCOPY WITH PROPOFOL (N/A ) POLYPECTOMY     Patient location during evaluation: PACU Anesthesia Type: MAC Level of consciousness: awake and alert Pain management: pain level controlled Vital Signs Assessment: post-procedure vital signs reviewed and stable Respiratory status: spontaneous breathing, nonlabored ventilation and respiratory function stable Cardiovascular status: stable and blood pressure returned to baseline Anesthetic complications: no    Last Vitals:  Vitals:   04/08/19 1350 04/08/19 1427  BP: (!) 168/96 (!) 184/76  Pulse: 75 70  Resp: (!) 21 20  Temp:  36.8 C  SpO2: 100% 97%    Last Pain:  Vitals:   04/08/19 1427  TempSrc: Oral  PainSc:                  Audry Pili

## 2019-04-08 NOTE — Op Note (Signed)
[facility] Patient Name: Jill Franco Procedure Date: 04/08/2019 MRN: [id number] Attending MD: Milus Banister , MD Date of Birth: 1934-12-16 CSN: [id number] Age: 85 Admit Type: Inpatient Procedure:                Colonoscopy Indications:              Hematochezia, normal hemoglobin (not anemic), last                            overt bleeding 2-3 days ago, no bleeding with prep,                            never had colonoscopy before Providers:                Milus Banister, MD, Kary Kos, RN, Cleda Daub, RN, Theodora Blow, Technician Referring MD:              Medicines:                Monitored Anesthesia Care Complications:            No immediate complications. Estimated blood loss:                            None. Estimated Blood Loss:     Estimated blood loss: none. Procedure:                Pre-Anesthesia Assessment:                           - Prior to the procedure, a History and Physical                            was performed, and patient medications and                            allergies were reviewed. The patient's tolerance of                            previous anesthesia was also reviewed. The risks                            and benefits of the procedure and the sedation                            options and risks were discussed with the patient.                            All questions were answered, and informed consent                            was obtained. Prior Anticoagulants: The patient has  taken Plavix (clopidogrel), last dose was 3 days                            prior to procedure. ASA Grade Assessment: III - A                            patient with severe systemic disease. After                            reviewing the risks and benefits, the patient was                            deemed in satisfactory condition to undergo the                            procedure.                      After obtaining informed consent, the colonoscope                            was passed under direct vision. Throughout the                            procedure, the patient's blood pressure, pulse, and                            oxygen saturations were monitored continuously. The                            [id number] Olympus colonoscope was                            introduced through the anus and advanced to the the                            cecum, identified by appendiceal orifice and                            ileocecal valve. The colonoscopy was performed                            without difficulty. The patient tolerated the                            procedure well. The quality of the bowel                            preparation was good. The ileocecal valve,                            appendiceal orifice, and rectum were photographed. Scope In: 1:07:59 PM Scope Out: 1:28:40 PM Scope Withdrawal Time: 0 hours 12 minutes 0 seconds  Total Procedure Duration: 0 hours 20 minutes 41 seconds  Findings:  Two 1cm non-bleeding cecal AVMs, not treated.      Three sessile polyps were noted in the transverse segment ranging in       size from 89mm to 1cm. All were removed with a cold snare. Resection and       retrieval were complete. There was not excessive, unusual post       polypectomy bleeding at any site.      One semipedunculated polyp was noted in the mid rectum, 6-39mm across.       This was removed with a cold snare. Resection and retreival were       complete. Again, no unusual post polypectomy bleeding at the site.      The exam was otherwise without abnormality on direct and retroflexion       views. Impression:               - Four polys were found and removed. All were                            resected and retrieved. No unsual post polypectomy                            bleeding (despite plavix last dose 3 days ago).                            - The semipedunculated polyp in the rectum MAY have                            been the site of recent minor bleeding.                           - Incidental cecal AVMs, very unlikely to have                            caused her recent minor bleeding and so these were                            not treated.                           - The examination was otherwise normal on direct                            and retroflexion views. Moderate Sedation:      Not Applicable - Patient had care per Anesthesia. Recommendation:           - OK to d/c home today from my standpoint if that                            is OK with primary SVC.                           - Safe to resume plavix tomorrow.                           - Await pathology results. Procedure Code(s):        ---  Professional ---                           [phone], Colonoscopy, flexible; with removal of                            tumor(s), polyp(s), or other lesion(s) by snare                            technique Diagnosis Code(s):        --- Professional ---                           K62.1, Rectal polyp                           K63.5, Polyp of colon                           K92.1, Melena (includes Hematochezia) CPT copyright 2019 American Medical Association. All rights reserved. The codes documented in this report are preliminary and upon coder review may  be revised to meet current compliance requirements. Milus Banister, MD 04/08/2019 1:37:12 PM This report has been signed electronically. Number of Addenda: 0

## 2019-04-08 NOTE — Discharge Summary (Signed)
Physician Discharge Summary  Jill Franco [id number] DOB: Mar 05, 1934 DOA: 04/05/2019  PCP: Tonia Ghent, MD  Admit date: 04/05/2019 Discharge date: 04/08/2019  Admitted From: Home  Discharge disposition: Home   Recommendations for Outpatient Follow-Up:   . Follow up with your primary care provider in one week.  . Check CBC, BMP in the next visit since the patient is on hydrochlorothiazide and recently had rectal bleed. Marland Kitchen 8.5 mm left lower lobe nodule was identified on CT scan of the chest during this hospitalization.  Since patient is a smoker recommendation is to follow-up with noncontrast CT scan in 3 months as outpatient.   Discharge Diagnosis:   Principal Problem:   Lower GI bleed Active Problems:   Hypothyroidism   HLD (hyperlipidemia)   HYPERTENSION, BENIGN ESSENTIAL   Smoking   Polyp of transverse colon    Discharge Condition: Improved.  Diet recommendation: Low sodium, heart healthy.    Wound care: None.  Code status: DNR   History of Present Illness:   Jill Franco a 85 y.o.femalewith medical history significant ofhypertension, hyperlipidemia, hypothyroidism, PVD on DAPT presented to the hospital for evaluation ofbloody bowel movements. Patient stated that for  2 days she had been having bloody bowel movements. No prior history of GI bleed. She has never had a colonoscopy.  ED Course:Rectal exam done in the ED showed gross hematochezia and FOBT positive. Blood pressure was elevated. Not tachycardic. Afebrile. Hemoglobin 12.8, previously in the 14-16 range. Sodium 129, mildly low on previous labs as well. SARS-CoV-2 PCR negative. CT abdomen pelvis showed no acute abdominal/pelvic findings but showing an indeterminate 8.5 mm left lower lobe pulmonary nodule.  ED provider discussed the case with Dr. Loletha Carrow from GI who recommended IV PPI twice daily and GI follow up.  Patient was then admitted to hospital for further evaluation and  treatment.  Patient was then admitted to the hospital for further evaluation and treatment.   Hospital Course:   Following conditions were addressed during hospitalization as listed below,  Hematochezia. FOBT was positive.  Patient was initially takenoff dual antiplatelets.  She did not have hematochezia since admission.GI  was consulted and patient underwent a colonoscopy and polypectomy on 04/08/2019 with findings of the polyps which were removed.  Patient was then considered stable for disposition home.  Patient never had colonoscopy before.  Hemoglobin remained stable during hospitalization.  Mild hyponatremia.  Improved with IV fluids.    Will need to pressure CBC BMP as outpatient.  Incidental pulmonary nodule- 8.5 mm left lower lobe.    Patient does have  history of tobacco tobacco use. Patient will need toto follow-up with noncontrast CT in 3 months as outpatient.  Nicotine use. Cessation advised..  Essential hypertension.  Continue Enalapril, metoprolol and HCTZ.  Hypothyroidism. Continue Synthroid.  Hyperlipidemia. Continue pravastatin.  Peripheral vascular disease on dual antiplatelets as outpatient. Did not receive dual antiplatelets until after the procedure.  GI has done the procedure today and recommended starting Plavix on 04/09/2019.  Disposition.  At this time, patient is stable for disposition home.  Spoke with the patient about outpatient follow-up with PCP.  Medical Consultants:    GI  Procedures:    Colonoscopy with polypectomy on 04/08/2019. Subjective:   Today, patient feels okay.  Was seen prior to colonoscopy.  Feels okay after colonoscopy as well.  Discharge Exam:   Vitals:   04/08/19 1350 04/08/19 1427  BP: (!) 168/96 (!) 184/76  Pulse: 75 70  Resp: (!) 21  20  Temp:  98.3 F (36.8 C)  SpO2: 100% 97%   Vitals:   04/08/19 1333 04/08/19 1340 04/08/19 1350 04/08/19 1427  BP: 116/61 (!) 190/77 (!) 168/96 (!) 184/76  Pulse: 69 66  75 70  Resp: (!) 24 (!) 25 (!) 21 20  Temp: 98.1 F (36.7 C)   98.3 F (36.8 C)  TempSrc: Oral   Oral  SpO2: 100% 100% 100% 97%  Weight:      Height:       General: Alert awake, not in obvious distress HENT: pupils equally reacting to light,  No scleral pallor or icterus noted. Oral mucosa is moist.  Chest:  Clear breath sounds.  Diminished breath sounds bilaterally. No crackles or wheezes.  CVS: S1 &S2 heard. No murmur.  Regular rate and rhythm. Abdomen: Soft, nontender, nondistended.  Bowel sounds are heard.   Extremities: No cyanosis, clubbing or edema.  Peripheral pulses are palpable. Psych: Alert, awake and oriented, normal mood CNS:  No cranial nerve deficits.  Power equal in all extremities.   Skin: Warm and dry.  No rashes noted.  The results of significant diagnostics from this hospitalization (including imaging, microbiology, ancillary and laboratory) are listed below for reference.     Diagnostic Studies:   CT ABDOMEN PELVIS W CONTRAST  Result Date: 04/05/2019 CLINICAL DATA:  Non localized abdominal pain and hematochezia. EXAM: CT ABDOMEN AND PELVIS WITH CONTRAST TECHNIQUE: Multidetector CT imaging of the abdomen and pelvis was performed using the standard protocol following bolus administration of intravenous contrast. CONTRAST:  184mL OMNIPAQUE IOHEXOL 300 MG/ML  SOLN COMPARISON:  None. FINDINGS: Lower chest: 8.5 mm left lower lobe pulmonary nodule is indeterminate. No other pulmonary lesions are identified. No infiltrates or effusions. The heart is normal in size. No pericardial effusion. Moderate pectus deformity with mass effect on the right ventricle. Coronary artery and aortic calcifications are noted. Hepatobiliary: No focal hepatic lesions or intrahepatic biliary dilatation. The gallbladder appears normal. No common bile duct dilatation. Pancreas: No mass, inflammation or ductal dilatation. Spleen: Normal size. No focal lesions. Adrenals/Urinary Tract: Mild enlargement  of the left adrenal gland but no discrete nodule. The kidneys are unremarkable. No renal, ureteral or bladder calculi or mass. Stomach/Bowel: The stomach, duodenum, small bowel and colon are grossly normal. No acute inflammatory changes, mass lesions or obstructive findings. The terminal ileum is normal. The appendix is surgically absent. Vascular/Lymphatic: Severe atherosclerotic calcifications involving the aorta and iliac arteries and branch vessels. Aortoiliac stents are noted. The major venous structures are patent. No abdominal or pelvic lymphadenopathy. Reproductive: The uterus and ovaries are unremarkable. Other: No pelvic mass or pelvic adenopathy. No free pelvic fluid collections. No inguinal mass or adenopathy. No abdominal wall hernia or subcutaneous lesions. Musculoskeletal: Osteoporosis and lumbar degenerative changes but no acute bony findings or worrisome bone lesions. Advanced bilateral hip joint degenerative changes but no AVN. There is a moderate-sized right hip joint effusion noted. IMPRESSION: 1. No acute abdominal/pelvic findings, mass lesions or lymphadenopathy. 2. 8.5 mm left lower lobe pulmonary nodule is indeterminate. Recommend followup noncontrast chest CT in 3 months. 3. Advanced atherosclerotic calcifications involving the aorta and branch vessels. 4. Advanced bilateral hip joint degenerative changes with a moderate-sized right hip joint effusion. Electronically Signed   By: Marijo Sanes M.D.   On: 04/05/2019 16:46     Labs:   Basic Metabolic Panel: Recent Labs  Lab 04/05/19 1511 04/05/19 1511 04/07/19 0558 04/08/19 0337  NA 129*  --  136 137  K 4.0   < > 3.8 4.0  CL 98  --  104 105  CO2 26  --  24 21*  GLUCOSE 98  --  97 98  BUN 15  --  7* 7*  CREATININE 0.63  --  0.60 0.58  CALCIUM 8.9  --  8.8* 9.0  MG  --   --  2.1 1.8   < > = values in this interval not displayed.   GFR Estimated Creatinine Clearance: 42.3 mL/min (by C-G formula based on SCr of 0.58  mg/dL). Liver Function Tests: Recent Labs  Lab 04/05/19 1511  AST 23  ALT 17  ALKPHOS 82  BILITOT 0.7  PROT 6.9  ALBUMIN 3.8   No results for input(s): LIPASE, AMYLASE in the last 168 hours. No results for input(s): AMMONIA in the last 168 hours. Coagulation profile Recent Labs  Lab 04/05/19 1511  INR 1.0    CBC: Recent Labs  Lab 04/05/19 1511 04/06/19 0136 04/06/19 1712 04/07/19 0558 04/08/19 0337  WBC 6.3 6.4  --  4.9 5.8  NEUTROABS 3.8  --   --   --   --   HGB 12.8 11.8* 11.4* 11.5* 12.7  HCT 40.4 36.9 36.5 36.7 40.5  MCV 91.4 89.8  --  90.8 90.0  PLT 274 238  --  227 252   Cardiac Enzymes: No results for input(s): CKTOTAL, CKMB, CKMBINDEX, TROPONINI in the last 168 hours. BNP: Invalid input(s): POCBNP CBG: No results for input(s): GLUCAP in the last 168 hours. D-Dimer No results for input(s): DDIMER in the last 72 hours. Hgb A1c No results for input(s): HGBA1C in the last 72 hours. Lipid Profile No results for input(s): CHOL, HDL, LDLCALC, TRIG, CHOLHDL, LDLDIRECT in the last 72 hours. Thyroid function studies No results for input(s): TSH, T4TOTAL, T3FREE, THYROIDAB in the last 72 hours.  Invalid input(s): FREET3 Anemia work up No results for input(s): VITAMINB12, FOLATE, FERRITIN, TIBC, IRON, RETICCTPCT in the last 72 hours. Microbiology Recent Results (from the past 240 hour(s))  SARS CORONAVIRUS 2 (TAT 6-24 HRS) Nasopharyngeal Nasopharyngeal Swab     Status: None   Collection Time: 04/05/19  6:01 PM   Specimen: Nasopharyngeal Swab  Result Value Ref Range Status   SARS Coronavirus 2 NEGATIVE NEGATIVE Final    Comment: (NOTE) SARS-CoV-2 target nucleic acids are NOT DETECTED. The SARS-CoV-2 RNA is generally detectable in upper and lower respiratory specimens during the acute phase of infection. Negative results do not preclude SARS-CoV-2 infection, do not rule out co-infections with other pathogens, and should not be used as the sole basis for  treatment or other patient management decisions. Negative results must be combined with clinical observations, patient history, and epidemiological information. The expected result is Negative. Fact Sheet for Patients: SugarRoll.be Fact Sheet for Healthcare Providers: https://www.woods-mathews.com/ This test is not yet approved or cleared by the [place] FDA and  has been authorized for detection and/or diagnosis of SARS-CoV-2 by FDA under an Emergency Use Authorization (EUA). This EUA will remain  in effect (meaning this test can be used) for the duration of the COVID-19 declaration under Section 56 4(b)(1) of the Act, 21 U.S.C. section 360bbb-3(b)(1), unless the authorization is terminated or revoked sooner. Performed at [facility], [place] [address], [place], [place] [address]      Discharge Instructions:   Discharge Instructions    Diet - low sodium heart healthy   Complete by: As directed    Discharge instructions   Complete  by: As directed    Follow up with your primary care physician in 1 week.  Check blood work at that time.  If you notice further bleeding please seek medical attention.  Start Plavix from 04/09/2019.   Increase activity slowly   Complete by: As directed      Allergies as of 04/08/2019      Reactions   Aspirin Other (See Comments)   REACTION: Uncoated Stomach upset on empty stomach   Codeine Nausea Only, Other (See Comments)   Pill needs to be E.coated   Lipitor [atorvastatin] Other (See Comments)   myalgia      Medication List    TAKE these medications   aspirin 81 MG EC tablet Take 81 mg by mouth 2 (two) times daily.   BLUE-EMU SUPER STRENGTH EX Apply 1 application topically as needed (knee pain).   cetirizine 10 MG tablet Commonly known as: ZYRTEC Take 10 mg by mouth as needed for allergies.   clopidogrel 75 MG tablet Commonly known as: PLAVIX Take 1 tablet (75 mg total) by mouth  daily. Start taking on: April 09, 2019   enalapril 10 MG tablet Commonly known as: VASOTEC Take 1.5 tablets (15 mg total) by mouth daily.   fluticasone 50 MCG/ACT nasal spray Commonly known as: FLONASE Place 2 sprays into both nostrils daily.   hydrochlorothiazide 12.5 MG capsule Commonly known as: MICROZIDE Take 1 capsule (12.5 mg total) by mouth every morning.   levothyroxine 50 MCG tablet Commonly known as: SYNTHROID TAKE 1 TABLET BY MOUTH  DAILY EXCEPT TAKE 2 TABLETS ON SUNDAYS AND WEDNESDAYS. (TOTAL 9  TABLETS PER WEEK) What changed:   how much to take  how to take this  when to take this  additional instructions   metoprolol tartrate 100 MG tablet Commonly known as: LOPRESSOR Take 1/2 tablet by mouth in the morning and 1 tablet in the evening. What changed:   how much to take  how to take this  when to take this  additional instructions   pravastatin 10 MG tablet Commonly known as: PRAVACHOL Take 1 tablet (10 mg total) by mouth daily.      Follow-up Information    Tonia Ghent, MD. Schedule an appointment as soon as possible for a visit in 1 week(s).   Specialty: Family Medicine Why: regular follow up Contact information: [place] [address] [phone]           Time coordinating discharge: 39 minutes  Signed:  Lavaya Defreitas  [facility] 04/08/2019, 3:00 PM

## 2019-04-08 NOTE — Interval H&P Note (Signed)
History and Physical Interval Note:  04/08/2019 12:50 PM  Jill Franco  has presented today for surgery, with the diagnosis of hematochezia.  The various methods of treatment have been discussed with the patient and family. After consideration of risks, benefits and other options for treatment, the patient has consented to  Procedure(s): COLONOSCOPY WITH PROPOFOL (N/A) as a surgical intervention.  The patient's history has been reviewed, patient examined, no change in status, stable for surgery.  I have reviewed the patient's chart and labs.  Questions were answered to the patient's satisfaction.     Milus Banister

## 2019-04-09 ENCOUNTER — Telehealth: Payer: Self-pay | Admitting: *Deleted

## 2019-04-09 ENCOUNTER — Inpatient Hospital Stay: Payer: Medicare Other | Admitting: Family Medicine

## 2019-04-09 ENCOUNTER — Other Ambulatory Visit (INDEPENDENT_AMBULATORY_CARE_PROVIDER_SITE_OTHER): Payer: Medicare Other

## 2019-04-09 ENCOUNTER — Telehealth: Payer: Self-pay

## 2019-04-09 DIAGNOSIS — K922 Gastrointestinal hemorrhage, unspecified: Secondary | ICD-10-CM

## 2019-04-09 LAB — CBC WITH DIFFERENTIAL/PLATELET
Basophils Absolute: 0.1 10*3/uL (ref 0.0–0.1)
Basophils Relative: 1 % (ref 0.0–3.0)
Eosinophils Absolute: 0.1 10*3/uL (ref 0.0–0.7)
Eosinophils Relative: 0.6 % (ref 0.0–5.0)
HCT: 36.8 % (ref 36.0–46.0)
Hemoglobin: 11.9 g/dL — ABNORMAL LOW (ref 12.0–15.0)
Lymphocytes Relative: 9.3 % — ABNORMAL LOW (ref 12.0–46.0)
Lymphs Abs: 0.9 10*3/uL (ref 0.7–4.0)
MCHC: 32.3 g/dL (ref 30.0–36.0)
MCV: 88.4 fl (ref 78.0–100.0)
Monocytes Absolute: 1.1 10*3/uL — ABNORMAL HIGH (ref 0.1–1.0)
Monocytes Relative: 11.5 % (ref 3.0–12.0)
Neutro Abs: 7.2 10*3/uL (ref 1.4–7.7)
Neutrophils Relative %: 77.6 % — ABNORMAL HIGH (ref 43.0–77.0)
Platelets: 274 10*3/uL (ref 150.0–400.0)
RBC: 4.17 Mil/uL (ref 3.87–5.11)
RDW: 13.9 % (ref 11.5–15.5)
WBC: 9.2 10*3/uL (ref 4.0–10.5)

## 2019-04-09 LAB — SURGICAL PATHOLOGY

## 2019-04-09 NOTE — Telephone Encounter (Signed)
Patient notified as instructed by telephone and verbalized understanding. Patient stated that she is going in this afternoon to had some blood work done that has been ordered by GI. Patient was rescheduled to see Dr. Damita Dunnings this Friday 04/12/19 at 12:00 noon. Advised patient to keep Dr. Damita Dunnings updated on her blood pressure as needed.

## 2019-04-09 NOTE — Telephone Encounter (Signed)
Spoke to patient by telephone and was advised that her temperature is 95.6, blood pressure left arm 179/90 pulse 76. Patient checked her blood pressure a few minutes later in her right arm and it was 158/103 and pulse 103.

## 2019-04-09 NOTE — Telephone Encounter (Signed)
Noted. Thanks.

## 2019-04-09 NOTE — Telephone Encounter (Signed)
Update [place] as needed about her BP.  CBC per GI.  Wouldn't restart plavix or aspirin as long as she is passing blood.   I think it is okay to push the appointment with me back a few days to get the above completed in the meantime.  Thanks.   App GI input.

## 2019-04-09 NOTE — Telephone Encounter (Signed)
Transition Care Management Follow-up Telephone Call  Date of discharge and from where: 04/08/2019, [facility]  How have you been since you were released from the hospital? Patient states that she is not doing well. Patient complains of continuous rectal bleeding since being home. She has not taken any of her meds because she is concerned about whether or not it is safe due to her rectal bleeding  Any questions or concerns? Yes , see above statement.   Items Reviewed:  Did the pt receive and understand the discharge instructions provided? Yes   Medications obtained and verified? Yes   Any new allergies since your discharge? No   Dietary orders reviewed? Yes  Do you have support at home? Yes   Functional Questionnaire: (I = Independent and D = Dependent) ADLs: I  Bathing/Dressing- I  Meal Prep- I  Eating- I  Maintaining continence- I  Transferring/Ambulation- I  Managing Meds- I  Follow up appointments reviewed:   PCP Hospital f/u appt confirmed? Yes  Scheduled to see Dr. Damita Dunnings on 04/09/2019 @ 4 pm.  [facility] f/u appt confirmed? N/A   Are transportation arrangements needed? No   If their condition worsens, is the pt aware to call PCP or go to the Emergency Dept.? Yes  Was the patient provided with contact information for the PCP's office or ED? Yes  Was to pt encouraged to call back with questions or concerns? Yes

## 2019-04-09 NOTE — Telephone Encounter (Signed)
Lab order in Epic the pt has been advised and she will come in this afternoon.  She was advised to go to the ED if bleeding persist or worsens.  The pt has been advised of the information and verbalized understanding.

## 2019-04-09 NOTE — Telephone Encounter (Signed)
Please see if she can get a set of vitals at home and please check with GI.  I routed this to GI in the meantime.   I need GI input.  Thanks.

## 2019-04-09 NOTE — Telephone Encounter (Signed)
Sounds like pretty minor bleeding.  Possibly related to polypectomy but still sounds pretty minor.  Patty, Can you contact her to come in for a cbc. If bleeding becomes more persistent, high volume then she needs to call or go to the ER.  thanks

## 2019-04-09 NOTE — Telephone Encounter (Signed)
See other note re: labs and GI input.

## 2019-04-09 NOTE — Telephone Encounter (Signed)
Patient left a voicemail stating that she went to the hospital Friday because she was having rectal bleeding. Patient stated that she had a procedure yesterday and blood work done.  Patient stated that she was sent home yesterday after the procedure. Patient stated that she has had more bleeding twice this morning. Patient wants to know what she should do? Patient stated that she has not taken her blood pressure medications this morning yet. Patient stated that the blood in the toilet and on the tissue paper is bright red. Patient stated that looking in the toilet the blood looks like about a tablespoon of blood each time she has gone to the bathroom. Patient denies any abdominal pain.

## 2019-04-09 NOTE — Telephone Encounter (Signed)
Patient was advised to go ahead and take her blood pressure medication per Dr. Damita Dunnings. Patient wanted to know how long she should hold the medication to help thin her blood?

## 2019-04-09 NOTE — Telephone Encounter (Signed)
Patient called back about labs she had done today She asked to speak with the nurse to advise on what she was told at the labs.   Patient did not leave message, requesting call

## 2019-04-09 NOTE — Telephone Encounter (Addendum)
Patient says she had blood drawn this morning and was called soon after returning home and was told it was stable.  She is to call GI tomorrow to let them know how she is doing.  She is not seeing blood in the commode any longer, just on toilet tissue when drying herself.

## 2019-04-10 ENCOUNTER — Encounter: Payer: Self-pay | Admitting: *Deleted

## 2019-04-10 NOTE — Telephone Encounter (Signed)
Noted. Thanks.  She has f/u pending for later this week.

## 2019-04-12 ENCOUNTER — Inpatient Hospital Stay: Payer: Medicare Other | Admitting: Family Medicine

## 2019-04-15 ENCOUNTER — Other Ambulatory Visit: Payer: Self-pay

## 2019-04-15 ENCOUNTER — Encounter: Payer: Self-pay | Admitting: Family Medicine

## 2019-04-15 ENCOUNTER — Ambulatory Visit (INDEPENDENT_AMBULATORY_CARE_PROVIDER_SITE_OTHER): Payer: Medicare Other | Admitting: Family Medicine

## 2019-04-15 VITALS — BP 130/80 | HR 65 | Temp 96.1°F | Ht 64.0 in | Wt 108.6 lb

## 2019-04-15 DIAGNOSIS — E871 Hypo-osmolality and hyponatremia: Secondary | ICD-10-CM

## 2019-04-15 DIAGNOSIS — K922 Gastrointestinal hemorrhage, unspecified: Secondary | ICD-10-CM | POA: Diagnosis not present

## 2019-04-15 DIAGNOSIS — R911 Solitary pulmonary nodule: Secondary | ICD-10-CM | POA: Diagnosis not present

## 2019-04-15 DIAGNOSIS — I739 Peripheral vascular disease, unspecified: Secondary | ICD-10-CM

## 2019-04-15 DIAGNOSIS — R42 Dizziness and giddiness: Secondary | ICD-10-CM | POA: Diagnosis not present

## 2019-04-15 MED ORDER — CLOPIDOGREL BISULFATE 75 MG PO TABS: 75.0000 mg | ORAL_TABLET | Freq: Every day | ORAL | Status: DC

## 2019-04-15 MED ORDER — ASPIRIN 81 MG PO TBEC: 81.0000 mg | DELAYED_RELEASE_TABLET | Freq: Every day | ORAL | Status: DC

## 2019-04-15 NOTE — Patient Instructions (Signed)
Go to the lab on the way out.   If you have mychart we'll likely use that to update you.    Recheck CT in about 3 months.  We'll call you about that closer to time.  Take 81mg  aspirin and 75mg  plavix a day.  Take care.  Glad to see you.

## 2019-04-15 NOTE — Progress Notes (Signed)
This visit occurred during the SARS-CoV-2 public health emergency.  Safety protocols were in place, including screening questions prior to the visit, additional usage of staff PPE, and extensive cleaning of exam room while observing appropriate contact time as indicated for disinfecting solutions.  Inpatient f/u.    ============================= Admit date: 04/05/2019 Discharge date: 04/08/2019  Admitted From: Home  Discharge disposition: Home   Recommendations for Outpatient Follow-Up:    Follow up with your primary care provider in one week.   Check CBC, BMP in the next visit since the patient is on hydrochlorothiazide and recently had rectal bleed.  8.5 mm left lower lobe nodule was identified on CT scan of the chest during this hospitalization.  Since patient is a smoker recommendation is to follow-up with noncontrast CT scan in 3 months as outpatient.   Discharge Diagnosis:   Principal Problem:   Lower GI bleed Active Problems:   Hypothyroidism   HLD (hyperlipidemia)   HYPERTENSION, BENIGN ESSENTIAL   Smoking   Polyp of transverse colon    Discharge Condition: Improved.  Diet recommendation: Low sodium, heart healthy.    Wound care: None.  Code status: DNR   History of Present Illness:   Jill Franco a 85 y.o.femalewith medical history significant ofhypertension, hyperlipidemia, hypothyroidism, PVD on DAPT presented to the hospitalfor evaluation ofbloody bowel movements. Patient stated thatfor 2 days she hadbeen having bloody bowel movements. Noprior history of GI bleed. She has never had a colonoscopy. ED Course:Rectal exam done in the ED showedgross hematochezia and FOBT positive. Blood pressurewaselevated. Not tachycardic. Afebrile. Hemoglobin 12.8, previously in the 14-16 range. Sodium 129, mildly low on previous labs as well. SARS-CoV-2 PCRnegative.CT abdomen pelvis showedno acute abdominal/pelvic findings but  showing an indeterminate 8.5 mm left lower lobe pulmonary nodule. ED provider discussed the case with Dr. Loletha Carrow from GI who recommended IV PPI twice daily and GI follow up.Patient was then admitted to hospital for further evaluation and treatment. Patient was then admitted to the hospital for further evaluation and treatment.   Hospital Course:   Following conditions were addressed during hospitalization as listed below,  Hematochezia. FOBT was positive.  Patient was initially takenoff dual antiplatelets.  She did not have hematochezia sinceadmission.GI was consulted and patient underwent a colonoscopy and polypectomy on 04/08/2019 with findings of the polyps which were removed.  Patient was then considered stable for disposition home.  Patient never had colonoscopy before.  Hemoglobin remained stable during hospitalization.  Mild hyponatremia.Improved with IV fluids.   Will need to pressure CBC BMP as outpatient.  Incidental pulmonary nodule-8.5 mm left lower lobe.  Patient does have  history of tobaccotobaccouse. Patient will need toto follow-up with noncontrast CT in 3 months as outpatient.  Nicotine use. Cessation advised..  Essential hypertension.  Continue Enalapril, metoprolol and HCTZ.  Hypothyroidism. Continue Synthroid.  Hyperlipidemia. Continue pravastatin.  Peripheral vascular disease on dual antiplatelets as outpatient. Did not receive dual antiplatelets until after the procedure.  GI has done the procedure today and recommended starting Plavix on 04/09/2019.  Disposition.  At this time, patient is stable for disposition home.  Spoke with the patient about outpatient follow-up with PCP.  ============================= Inpatient course discussed with patient.  She was admitted with hematochezia and GI evaluation was done as inpatient.  Discussed with patient.  She had mild hyponatremia that improved in the meantime.  Incidental pulmonary nodule  was noted.  Inpatient team discussed smoking cessation.  She was maintained on her baseline medications for blood pressure cholesterol  and hyperlipidemia.  We talked about restarting/continuing her antiplatelet medications given her peripheral vascular disease.  In the meantime she had a brief episode of vertigo associated with elevated BP since coming home from the hospital.  She had been on her routine meds at that point.  No speech changes or weakness.  This was 2 days ago.  No sx in the meantime.  She has taken tylenol for leg pain, used yesterday with some relief.    Pulmonary nodule.  Noted and discussed getting follow-up CT.  She has had blood in stool noted a few times since coming home but this has stopped in the meantime.  No black stools.  No more abnormal stools now.  Due for follow-up labs.  Discussed her history of hyponatremia, due for f/u labs.    PMH and SH reviewed  ROS: Per HPI unless specifically indicated in ROS section   Meds, vitals, and allergies reviewed.   GEN: nad, alert and oriented HEENT: ncat NECK: supple w/o LA CV: rrr PULM: ctab, no inc wob ABD: soft, +bs EXT: no edema SKIN: no acute rash

## 2019-04-16 LAB — CBC WITH DIFFERENTIAL/PLATELET
Basophils Absolute: 0.1 10*3/uL (ref 0.0–0.1)
Basophils Relative: 1.3 % (ref 0.0–3.0)
Eosinophils Absolute: 0.2 10*3/uL (ref 0.0–0.7)
Eosinophils Relative: 2.6 % (ref 0.0–5.0)
HCT: 37.2 % (ref 36.0–46.0)
Hemoglobin: 12.1 g/dL (ref 12.0–15.0)
Lymphocytes Relative: 19.7 % (ref 12.0–46.0)
Lymphs Abs: 1.4 10*3/uL (ref 0.7–4.0)
MCHC: 32.4 g/dL (ref 30.0–36.0)
MCV: 87.6 fl (ref 78.0–100.0)
Monocytes Absolute: 1 10*3/uL (ref 0.1–1.0)
Monocytes Relative: 13.5 % — ABNORMAL HIGH (ref 3.0–12.0)
Neutro Abs: 4.5 10*3/uL (ref 1.4–7.7)
Neutrophils Relative %: 62.9 % (ref 43.0–77.0)
Platelets: 273 10*3/uL (ref 150.0–400.0)
RBC: 4.25 Mil/uL (ref 3.87–5.11)
RDW: 14 % (ref 11.5–15.5)
WBC: 7.1 10*3/uL (ref 4.0–10.5)

## 2019-04-16 LAB — BASIC METABOLIC PANEL
BUN: 13 mg/dL (ref 6–23)
CO2: 28 mEq/L (ref 19–32)
Calcium: 9.4 mg/dL (ref 8.4–10.5)
Chloride: 97 mEq/L (ref 96–112)
Creatinine, Ser: 0.7 mg/dL (ref 0.40–1.20)
GFR: 79.93 mL/min (ref >60.00)
Glucose, Bld: 103 mg/dL — ABNORMAL HIGH (ref 70–99)
Potassium: 4.9 mEq/L (ref 3.5–5.1)
Sodium: 132 mEq/L — ABNORMAL LOW (ref 135–145)

## 2019-04-17 DIAGNOSIS — E871 Hypo-osmolality and hyponatremia: Secondary | ICD-10-CM | POA: Insufficient documentation

## 2019-04-17 DIAGNOSIS — R42 Dizziness and giddiness: Secondary | ICD-10-CM | POA: Insufficient documentation

## 2019-04-17 DIAGNOSIS — R911 Solitary pulmonary nodule: Secondary | ICD-10-CM | POA: Insufficient documentation

## 2019-04-17 NOTE — Assessment & Plan Note (Signed)
Discussed.  Reasonable to get recheck CT in about 3 months.  She agrees.

## 2019-04-17 NOTE — Assessment & Plan Note (Signed)
Unclear source without any neurologic symptoms.  She will let me know if this is recurring.

## 2019-04-17 NOTE — Assessment & Plan Note (Signed)
She had a small amount of bleeding since she left the hospital but this has resolved in the meantime.  Discussed option.  Would restart aspirin 81 mg and Plavix 75 mg daily.  If she has any bleeding that we may have to cut this back.  Discussed.  We talked about the risk versus benefit of these medications/combination.  She agreed to proceed.  See follow-up labs today.

## 2019-04-17 NOTE — Assessment & Plan Note (Signed)
See notes on follow-up labs. 

## 2019-04-21 ENCOUNTER — Ambulatory Visit: Payer: Medicare Other | Attending: Internal Medicine

## 2019-04-21 DIAGNOSIS — Z23 Encounter for immunization: Secondary | ICD-10-CM | POA: Insufficient documentation

## 2019-04-21 NOTE — Progress Notes (Signed)
   Covid-19 Vaccination Clinic  Name:  Jill Franco    MRN: [id number] DOB: 1934/08/18  04/21/2019  [name] was observed post Covid-19 immunization for 15 minutes without incidence. She was provided with Vaccine Information Sheet and instruction to access the V-Safe system.   [name] was instructed to call 911 with any severe reactions post vaccine: Marland Kitchen Difficulty breathing  . Swelling of your face and throat  . A fast heartbeat  . A bad rash all over your body  . Dizziness and weakness    Immunizations Administered    Name Date Dose VIS Date Route   Pfizer COVID-19 Vaccine 04/21/2019  9:55 AM 0.3 mL 02/01/2019 Intramuscular   Manufacturer: [place]   Lot: [id number]   Primera: [id number]

## 2019-04-23 ENCOUNTER — Other Ambulatory Visit: Payer: Self-pay | Admitting: *Deleted

## 2019-04-23 DIAGNOSIS — I6523 Occlusion and stenosis of bilateral carotid arteries: Secondary | ICD-10-CM

## 2019-04-23 DIAGNOSIS — I779 Disorder of arteries and arterioles, unspecified: Secondary | ICD-10-CM

## 2019-04-24 ENCOUNTER — Telehealth (HOSPITAL_COMMUNITY): Payer: Self-pay

## 2019-04-24 NOTE — Telephone Encounter (Signed)

## 2019-04-25 NOTE — Progress Notes (Signed)
HISTORY AND PHYSICAL     CC:  follow up. Requesting Provider:  Tonia Ghent, MD  HPI: This is a 85 y.o. female who is here today for follow up.  She is followed by Dr. Oneida Alar for carotid artery stenosis and PAD.  In October 2019, she had episode of seeing multiple colors in the left occular field and at one point she had loss of vision for a few seconds.  She was sent for a CTA of the head and neck.  This revealed 55 % irregular stenosis at the carotid bifurcation and the ICA.  The patient denies previous symptoms of TIA, amaurosis, or stroke.   She has hx of left CIA stent and aortic stent for non healing wound on the right foot with claudication in 2018 by Dr. Oneida Alar.  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  She had a flush occlusion of the right SFA with reconstitution of the AK popliteal artery with 3 vessel runoff and diffusely diseased multiple subtotal calcified segments of occlusive dz left SFA with patent AK popliteal artery with 3 vessel runoff to the left foot.   She was last seen in November 2019, and at that time, she had had recent hx of left eye vision loss for a few seconds.  She had sx suggestive of ocular migraine, which was also suggested by her ophthalmologist.  However, on review of the CTA, she had very irregular lesion in the left ICA with ulceration and friable appearing plaque, which certainly could have accounted for her sx.  Dr. Oneida Alar d/w the pt  The possibility of left CEA for CVA prophylaxis as well as the risk of stroke from this lesion of about 5 %/year.  She was reluctant to proceed with CEA and Dr. Oneida Alar continued her Plavix/statin/asa and scheduled f/u for 6 months.  She is here today for that f/u visit.    She recently had a CT scan, which revealed an 8.35mm pulmonary nodule that will be rechecked in 3 months with  repeat CT.    She has hx of recent lower GIB and was hospitalized last month.  Her asa/plavix were restarted, but if there was any evidence of bleeding, this would have to be evaluated.   The pt returns today for follow up.  She states that she has not had any more visual disturbances since she was here last.  She denies any speech difficulties, weakness, clumsiness, or paralysis of any limb.   She states she does have some pain in her left leg from her groin down the inside of her leg to below the knee.  She states this bothers her with walking and rest.  She has hx of back surgery x 3 in the past.  She states that her left knee gives her trouble.  She does not have any rest pain in her feet, she does not have any claudication with walking.  She does not have any non healing wounds on either foot.   She states that she is still smoking but down to about 6 cigarettes per day.  She states she is wearing a nicotine patch and this has really helped her cut back.    She states that she started having some more bright red bleeding per rectum today.  She states her stools have been black.  She  is taking her plavix/asa after it was restarted last month.   She is followed by Dr. Jaquel Stain for other medical issues including HLD and HTN.     The pt is on a statin for cholesterol management.    The pt is on an aspirin.    Other AC:  Plavix The pt is on ACEI, BB for hypertension.  The pt does not have diabetes. Tobacco hx:  Current.   Past Medical History:  Diagnosis Date  . Cataract   . CVA (cerebral vascular accident) ([place])   . Double vision   . Facial droop 10/2017   left  . History of shingles 2012  . HLD (hyperlipidemia)   . HTN (hypertension)   . Hypothyroidism   . PVD (peripheral vascular disease) ([place])   . Vision loss    temporary    Past Surgical History:  Procedure Laterality Date  . ABDOMINAL EXPLORATION SURGERY  1950s   "they thought I was pregnant; went in to explore"    . APPENDECTOMY  childhood  . BACK SURGERY    . BREAST LUMPECTOMY Left 1960's   benign  . carotid ultrasound  06/19/2007   0-39% stenosis  . CATARACT EXTRACTION W/ INTRAOCULAR LENS IMPLANT Left 09/2016  . CATARACT EXTRACTION W/ INTRAOCULAR LENS IMPLANT Right 10/2016  . COLONOSCOPY WITH PROPOFOL N/A 04/08/2019   Procedure: COLONOSCOPY WITH PROPOFOL;  Surgeon: Milus Banister, MD;  Location: [facility];  Service: Endoscopy;  Laterality: N/A;  . DILATION AND CURETTAGE OF UTERUS  1960's X 3   miscarriages  . DOPPLER ECHOCARDIOGRAPHY  06/19/2007   Normal EF 55-70%  . ILIAC ARTERY STENT Left 03/18/2016   common iliac stent (6 x 40), aortic stent (10 x 19)/notes 03/18/2016  . LAMINECTOMY AND MICRODISCECTOMY SPINE  1960's X 2  . MOLE REMOVAL  07/10/2016  . NSVD     x1  . PERIPHERAL VASCULAR CATHETERIZATION N/A 03/18/2016   Procedure: Abdominal Aortogram w/Lower Extremity;  Surgeon: Elam Dutch, MD;  Location: [facility];  Service: Cardiovascular;  Laterality: N/A;  . PERIPHERAL VASCULAR CATHETERIZATION  03/18/2016   Procedure: Peripheral Vascular Intervention;  Surgeon: Elam Dutch, MD;  Location: [facility];  Service: Cardiovascular;;  . POLYPECTOMY  04/08/2019   Procedure: POLYPECTOMY;  Surgeon: Milus Banister, MD;  Location: [facility];  Service: Endoscopy;;    Allergies  Allergen Reactions  . Aspirin Other (See Comments)    REACTION: Uncoated Stomach upset on empty stomach  . Codeine Nausea Only and Other (See Comments)    Pill needs to be E.coated  . Lipitor [Atorvastatin] Other (See Comments)    myalgia    Current Outpatient Medications  Medication Sig Dispense Refill  . aspirin 81 MG EC tablet Take 1 tablet (81 mg total) by mouth daily.    . cetirizine (ZYRTEC) 10 MG tablet Take 10 mg by mouth as needed for allergies.    Marland Kitchen clopidogrel (PLAVIX) 75 MG tablet Take 1 tablet (75 mg total) by mouth daily.    . enalapril (VASOTEC) 10 MG tablet Take 1.5  tablets (15 mg total) by mouth daily. 45 tablet 0  . fluticasone (FLONASE) 50 MCG/ACT nasal spray Place 2 sprays into both nostrils daily.    . hydrochlorothiazide (MICROZIDE) 12.5 MG capsule Take 1 capsule (12.5 mg total) by mouth every morning. 90 capsule 1  . levothyroxine (SYNTHROID) 50 MCG tablet TAKE 1 TABLET BY MOUTH  DAILY EXCEPT TAKE 2 TABLETS ON SUNDAYS AND WEDNESDAYS. (  TOTAL 9  TABLETS PER WEEK) 108 tablet 3  . Liniments (BLUE-EMU SUPER STRENGTH EX) Apply 1 application topically as needed (knee pain).    . metoprolol tartrate (LOPRESSOR) 100 MG tablet Take 1/2 tablet by mouth in the morning and 1 tablet in the evening. 135 tablet 1  . pravastatin (PRAVACHOL) 10 MG tablet Take 1 tablet (10 mg total) by mouth daily. 90 tablet 2   No current facility-administered medications for this visit.    Family History  Problem Relation Age of Onset  . Stroke Father 53  . Cancer Brother   . Colon cancer Neg Hx   . Breast cancer Neg Hx     Social History   Socioeconomic History  . Marital status: Widowed    Spouse name: Not on file  . Number of children: 1  . Years of education: Not on file  . Highest education level: Not on file  Occupational History  . 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 Smoking status: Current Every Day Smoker    Packs/day: 0.33    Years: 62.00    Pack years: 20.46    Types: Cigarettes  . Smokeless tobacco: Never Used  Substance and Sexual Activity  . Alcohol use: No    Alcohol/week: 0.0 standard drinks  . Drug use: No  . Sexual activity: Not on file  Other Topics Concern  . Not on file  Social History Narrative   From [employer]   Retired 2007   Widowed 2008 after 49 years, lives alone.     Enjoys yard work, but needs some help with yard work now.     Her daughter is helping at home some (some as of 2019)   Social Determinants of Health   Financial Resource Strain:   . Difficulty of Paying Living Expenses:  Not on file  Food Insecurity:   . Worried About Charity fundraiser in the Last Year: Not on file  . Ran Out of Food in the Last Year: Not on file  Transportation Needs:   . Lack of Transportation (Medical): Not on file  . Lack of Transportation (Non-Medical): Not on file  Physical Activity:   . Days of Exercise per Week: Not on file  . Minutes of Exercise per Session: Not on file  Stress:   . Feeling of Stress : Not on file  Social Connections:   . Frequency of Communication with Friends and Family: Not on file  . Frequency of Social Gatherings with Friends and Family: Not on file  . Attends Religious Services: Not on file  . Active Member of Clubs or Organizations: Not on file  . Attends Archivist Meetings: Not on file  . Marital Status: Not on file  Intimate Partner Violence:   . Fear of Current or Ex-Partner: Not on file  . Emotionally Abused: Not on file  . Physically Abused: Not on file  . Sexually Abused: Not on file     REVIEW OF SYSTEMS:   [X]  denotes positive finding, [ ]  denotes negative finding Cardiac  Comments:  Chest pain or chest pressure:    Shortness of breath upon exertion:    Short of breath when lying flat:    Irregular heart rhythm:        Vascular    Pain in calf, thigh, or hip brought on by ambulation:    Pain in feet at night that wakes you up from your sleep:  Blood clot in your veins:    Leg swelling:         Pulmonary    Oxygen at home:    Productive cough:  x   Wheezing:  x       Neurologic    Sudden weakness in arms or legs:     Sudden numbness in arms or legs:     Sudden onset of difficulty speaking or slurred speech:    Temporary loss of vision in one eye:     Problems with dizziness:         Gastrointestinal    Blood in stool:  x   Vomited blood:         Genitourinary    Burning when urinating:     Blood in urine:        Psychiatric    Major depression:         Hematologic    Bleeding problems:      Problems with blood clotting too easily:        Skin    Rashes or ulcers:        Constitutional    Fever or chills:      PHYSICAL EXAMINATION:  Today's Vitals   04/26/19 1030  BP: (!) 153/71  Pulse: (!) 59  Resp: 14  Temp: (!) 97.3 F (36.3 C)  TempSrc: Temporal  SpO2: 98%  Weight: 109 lb (49.4 kg)  Height: 5\' 4"  (1.626 m)  PainSc: 10-Worst pain ever   Body mass index is 18.71 kg/m.   General:  WDWN in NAD; vital signs documented above Gait: Not observed HENT: WNL, normocephalic Pulmonary: normal non-labored breathing , without Rales, rhonchi,  wheezing Cardiac: regular HR, without  Murmurs; without carotid bruits Abdomen: soft, NT, no masses Skin: without rashes Vascular Exam/Pulses:  Right Left  Radial 2+ (normal) 2+ (normal)  Femoral 2+ (normal) 2+ (normal)  Popliteal Unable to palpate  Unable to palpate   DP Monophasic doppler signal Monophasic doppler signal  PT Monophasic doppler signal Monophasic doppler signal  Peroneal Monophasic doppler signal absent   Extremities: without ischemic changes, without Gangrene , without cellulitis; without open wounds; mild swelling in BLE feet as well as ruborous.  Musculoskeletal: no muscle wasting or atrophy  Neurologic: A&O X 3;  No focal weakness or paresthesias are detected Psychiatric:  The pt has Normal affect.   Non-Invasive Vascular Imaging:   ABI's/TBI's on 04/26/2019: Right:  0.62/0.20 - great toe pressure:  32 Left:  0.74/0.20 - great toe pressure:  33  Non-Invasive Vascular Imaging:   Carotid Duplex on 04/26/2019: Right:  1-39% ICA stenosis Left:  40-59% ICA stenosis  Previous ABI's/TBI's on 11/09/2017: Right:  0.71/0.46 - great toe pressure 91 (dampened) Left:  0.82/0.53 - great toe pressure:  106  Previous Carotid duplex on 11/09/2017: Right: 1-39% ICA stenosis Left:   40-59% ICA stenosis Vertebrals: Bilateral vertebral arteries demonstrate antegrade flow.  Subclavians: Normal flow hemodynamics  were seen in bilateral subclavian arteries.  CTA head/neck 12/19/2017: IMPRESSION: 1. No emergent large vessel occlusion. No hemodynamically significant intracranial stenosis. 2. Multifocal bilateral common and internal carotid atherosclerosis with 55% stenosis of the proximal left ICA and approximately 40% stenosis of the proximal right ICA. 3. Approximately 50% stenosis of both vertebral artery origins. 4. Multifocal stenosis of both vertebral artery V2 segments, worst on the right at the C3 level, measuring approximately 75%. 5.  Aortic Atherosclerosis (ICD10-I70.0).   ASSESSMENT/PLAN:: 85 y.o. female here for follow up for symptomatic  left carotid artery stenosis with left eye vision loss for a few seconds in 2019 & presents today for follow up.    Carotid artery stenosis: -pt carotid duplex has not changed and she has not had any further sx.  Discussed with Dr. Donzetta Matters and will have pt return in one year for repeat carotid duplex.  Discussed the sx of stroke with pt and she knows to call 911 should she have any issues.   PAD: -pt has some right leg pain medially from groin to below her knee.  She does not have any rest pain in either foot, she denies any claudication sx of either leg and does not have any non healing wounds.  Her ABI's are decreased, but do not think her sx are related to her arterial disease.  I discussed with Dr. Donzetta Matters and given the pt is having recurrent GIB that started today, she can stop her Plavix for now but would like to resume when the GIB has resolved.  If at all possible, would like to continue baby asa.  Discussed with pt that she will return in 6 months with aorto-iliac duplex and ABI's and see Dr. Oneida Alar.  She will call us sooner should she have any issues with non healing wounds or worsening leg pain.    GIB -pt having recurrent bright red bleeding per rectum today and reports she has had melena.  Discussed with Dr. Donzetta Matters and would like to continue baby  aspirin if possible.  Okay to stop Plavix until GIB resolved and then can start this back when GIB resolved.   Leontine Locket, PA-C [facility] [phone]  Clinic MD:   Donzetta Matters

## 2019-04-26 ENCOUNTER — Other Ambulatory Visit: Payer: Self-pay

## 2019-04-26 ENCOUNTER — Telehealth: Payer: Self-pay

## 2019-04-26 ENCOUNTER — Encounter (HOSPITAL_COMMUNITY): Payer: Self-pay

## 2019-04-26 ENCOUNTER — Ambulatory Visit (INDEPENDENT_AMBULATORY_CARE_PROVIDER_SITE_OTHER)
Admission: RE | Admit: 2019-04-26 | Discharge: 2019-04-26 | Disposition: A | Discharge status: Home / Self Care | Payer: Medicare Other | Source: Ambulatory Visit | Attending: Vascular Surgery | Admitting: Vascular Surgery

## 2019-04-26 ENCOUNTER — Ambulatory Visit: Payer: Medicare Other | Admitting: Physician Assistant

## 2019-04-26 ENCOUNTER — Emergency Department (HOSPITAL_COMMUNITY)
Admission: EM | Admit: 2019-04-26 | Discharge: 2019-04-26 | Disposition: A | Discharge status: Home / Self Care | Payer: Medicare Other | Attending: Emergency Medicine | Admitting: Emergency Medicine

## 2019-04-26 VITALS — BP 163/76 | HR 59 | Temp 97.3°F | Resp 14 | Ht 64.0 in | Wt 109.0 lb

## 2019-04-26 DIAGNOSIS — E039 Hypothyroidism, unspecified: Secondary | ICD-10-CM | POA: Diagnosis not present

## 2019-04-26 DIAGNOSIS — Z7982 Long term (current) use of aspirin: Secondary | ICD-10-CM | POA: Insufficient documentation

## 2019-04-26 DIAGNOSIS — K921 Melena: Secondary | ICD-10-CM

## 2019-04-26 DIAGNOSIS — Z79899 Other long term (current) drug therapy: Secondary | ICD-10-CM | POA: Insufficient documentation

## 2019-04-26 DIAGNOSIS — I10 Essential (primary) hypertension: Secondary | ICD-10-CM | POA: Diagnosis not present

## 2019-04-26 DIAGNOSIS — I6523 Occlusion and stenosis of bilateral carotid arteries: Secondary | ICD-10-CM | POA: Insufficient documentation

## 2019-04-26 DIAGNOSIS — Z7902 Long term (current) use of antithrombotics/antiplatelets: Secondary | ICD-10-CM | POA: Insufficient documentation

## 2019-04-26 DIAGNOSIS — K625 Hemorrhage of anus and rectum: Secondary | ICD-10-CM | POA: Diagnosis present

## 2019-04-26 DIAGNOSIS — I779 Disorder of arteries and arterioles, unspecified: Secondary | ICD-10-CM | POA: Insufficient documentation

## 2019-04-26 DIAGNOSIS — I6529 Occlusion and stenosis of unspecified carotid artery: Secondary | ICD-10-CM

## 2019-04-26 DIAGNOSIS — F17210 Nicotine dependence, cigarettes, uncomplicated: Secondary | ICD-10-CM | POA: Diagnosis not present

## 2019-04-26 DIAGNOSIS — Z8673 Personal history of transient ischemic attack (TIA), and cerebral infarction without residual deficits: Secondary | ICD-10-CM | POA: Insufficient documentation

## 2019-04-26 LAB — COMPREHENSIVE METABOLIC PANEL
ALT: 19 U/L (ref 0–44)
AST: 25 U/L (ref 15–41)
Albumin: 4.2 g/dL (ref 3.5–5.0)
Alkaline Phosphatase: 87 U/L (ref 38–126)
Anion gap: 9 (ref 5–15)
BUN: 21 mg/dL (ref 8–23)
CO2: 25 mmol/L (ref 22–32)
Calcium: 8.9 mg/dL (ref 8.9–10.3)
Chloride: 100 mmol/L (ref 98–111)
Creatinine, Ser: 0.72 mg/dL (ref 0.44–1.00)
GFR calc Af Amer: >60 mL/min (ref >60)
GFR calc non Af Amer: >60 mL/min (ref >60)
Glucose, Bld: 97 mg/dL (ref 70–99)
Potassium: 3.8 mmol/L (ref 3.5–5.1)
Sodium: 134 mmol/L — ABNORMAL LOW (ref 135–145)
Total Bilirubin: 0.1 mg/dL — ABNORMAL LOW (ref 0.3–1.2)
Total Protein: 7.3 g/dL (ref 6.5–8.1)

## 2019-04-26 LAB — CBC
HCT: 38 % (ref 36.0–46.0)
Hemoglobin: 11.6 g/dL — ABNORMAL LOW (ref 12.0–15.0)
MCH: 27.9 pg (ref 26.0–34.0)
MCHC: 30.5 g/dL (ref 30.0–36.0)
MCV: 91.3 fL (ref 80.0–100.0)
Platelets: 309 10*3/uL (ref 150–400)
RBC: 4.16 MIL/uL (ref 3.87–5.11)
RDW: 13.3 % (ref 11.5–15.5)
WBC: 6.3 10*3/uL (ref 4.0–10.5)
nRBC: 0 % (ref 0.0–0.2)

## 2019-04-26 LAB — POC OCCULT BLOOD, ED: Fecal Occult Bld: POSITIVE — AB

## 2019-04-26 LAB — TYPE AND SCREEN
ABO/RH(D): O NEG
Antibody Screen: NEGATIVE

## 2019-04-26 MED ORDER — SODIUM CHLORIDE 0.9 % IV BOLUS (SEPSIS)
500.0000 mL | Freq: Once | INTRAVENOUS | Status: AC
  Administered 2019-04-26: 500 mL via INTRAVENOUS

## 2019-04-26 NOTE — ED Triage Notes (Signed)
Patient states she had a dark stool yesterday, but there was bright red blood in the toilet water.  Patient denies any pain. Patient takes Plavix due to poor circulation to the legs and had stent placement greater than a year ago.

## 2019-04-26 NOTE — Telephone Encounter (Signed)
I called and spoke with the pt's daughter and she advised that she will be taking her mother to the ED now, probably [place].  I will forward to Dr Ardis Hughs for review.

## 2019-04-26 NOTE — Telephone Encounter (Signed)
Patient called stating she is having stool issues again like she did in February. Patient states she noticed yesterday her bowels had some blood in it. Today this is worse, stools look black but the toilet water is bright red. No abdominal pain. She waited a while last time at the ER and was admitted and she does not want to drive there and have to leave her car at the hospital for too long. Her daughter gets off work at 1:30 pm and can take her to the ER but patient wanted to know if Dr Damita Dunnings can call and get her admitted directly to [facility]. I spoke with Dr Damita Dunnings and advised patient that he is not able to do that unfortunately but she can call her GI and see if they are able to get her in faster. Patient was advised that she needs to be evaluated today. Patient understood and said thank you and she will call GI and go to ER if nothing can be done by them. FYI to PCP.

## 2019-04-26 NOTE — Telephone Encounter (Signed)
Agreed, routed to GI as FYI.

## 2019-04-26 NOTE — ED Provider Notes (Signed)
[facility] DEPT Provider Note   CSN: [id number] Arrival date & time: 04/26/19  1447     History Chief Complaint  Patient presents with  . Jill Franco is a 85 y.o. female.  Patient states she started having rectal bleeding yesterday.  She had 2 bouts of rectal bleeding.  Then she had one episode of rectal bleeding here.  The patient was recently hospitalized and had polyps removed and rectal bleeding.  She also is on Plavix and aspirin for peripheral vascular disease  The history is provided by the patient. No language interpreter was used.  Rectal Bleeding Quality:  Maroon Amount:  Moderate Timing:  Constant Chronicity:  New Context: not anal fissures   Similar prior episodes: no   Relieved by:  Nothing Worsened by:  Nothing Ineffective treatments:  None tried Associated symptoms: no abdominal pain   Risk factors: anticoagulant use        Past Medical History:  Diagnosis Date  . Cataract   . CVA (cerebral vascular accident) ([place])   . Double vision   . Facial droop 10/2017   left  . History of shingles 2012  . HLD (hyperlipidemia)   . HTN (hypertension)   . Hypothyroidism   . PVD (peripheral vascular disease) ([place])   . Vision loss    temporary    Patient Active Problem List   Diagnosis Date Noted  . Pulmonary nodule 04/17/2019  . Hyponatremia 04/17/2019  . Vertigo 04/17/2019  . Polyp of transverse colon   . Lower GI bleed 04/05/2019  . Medicare annual wellness visit, subsequent 09/12/2018  . CVA (cerebral vascular accident) ([place]) 11/27/2017  . Vision loss 11/21/2017  . Double vision 08/16/2016  . PAD (peripheral artery disease) ([place]) 03/18/2016  . Peripheral vascular disease ([place]) 01/11/2016  . Back pain 01/03/2016  . Claudication ([place]) 01/03/2016  . Paresthesia 01/03/2016  . Hair loss 10/01/2015  . Colon cancer screening 08/06/2015  . Allergic rhinitis 03/20/2015  . Advance care planning  08/04/2014  . Smoking 08/04/2014  . Healthcare maintenance 06/07/2011  . Herpes zoster 03/19/2010  . Hypothyroidism 08/23/2006  . HLD (hyperlipidemia) 08/23/2006  . HYPERTENSION, BENIGN ESSENTIAL 07/18/2006    Past Surgical History:  Procedure Laterality Date  . ABDOMINAL EXPLORATION SURGERY  1950s   "they thought I was pregnant; went in to explore"  . APPENDECTOMY  childhood  . BACK SURGERY    . BREAST LUMPECTOMY Left 1960's   benign  . carotid ultrasound  06/19/2007   0-39% stenosis  . CATARACT EXTRACTION W/ INTRAOCULAR LENS IMPLANT Left 09/2016  . CATARACT EXTRACTION W/ INTRAOCULAR LENS IMPLANT Right 10/2016  . COLONOSCOPY WITH PROPOFOL N/A 04/08/2019   Procedure: COLONOSCOPY WITH PROPOFOL;  Surgeon: Milus Banister, MD;  Location: [facility] ENDOSCOPY;  Service: Endoscopy;  Laterality: N/A;  . DILATION AND CURETTAGE OF UTERUS  1960's X 3   miscarriages  . DOPPLER ECHOCARDIOGRAPHY  06/19/2007   Normal EF 55-70%  . ILIAC ARTERY STENT Left 03/18/2016   common iliac stent (6 x 40), aortic stent (10 x 19)/notes 03/18/2016  . LAMINECTOMY AND MICRODISCECTOMY SPINE  1960's X 2  . MOLE REMOVAL  07/10/2016  . NSVD     x1  . PERIPHERAL VASCULAR CATHETERIZATION N/A 03/18/2016   Procedure: Abdominal Aortogram w/Lower Extremity;  Surgeon: Elam Dutch, MD;  Location: [facility];  Service: Cardiovascular;  Laterality: N/A;  . PERIPHERAL VASCULAR CATHETERIZATION  03/18/2016   Procedure: Peripheral  Vascular Intervention;  Surgeon: Elam Dutch, MD;  Location: [facility];  Service: Cardiovascular;;  . POLYPECTOMY  04/08/2019   Procedure: POLYPECTOMY;  Surgeon: Milus Banister, MD;  Location: [facility];  Service: Endoscopy;;     OB History   No obstetric history on file.     Family History  Problem Relation Age of Onset  . Stroke Father 83  . Cancer Brother   . Colon cancer Neg Hx   . Breast cancer Neg Hx     Social History   Tobacco Use  . Smoking status:  Current Every Day Smoker    Packs/day: 0.33    Years: 62.00    Pack years: 20.46    Types: Cigarettes  . Smokeless tobacco: Never Used  Substance Use Topics  . Alcohol use: No    Alcohol/week: 0.0 standard drinks  . Drug use: No    Home Medications Prior to Admission medications   Medication Sig Start Date End Date Taking? Authorizing Provider  aspirin 81 MG EC tablet Take 1 tablet (81 mg total) by mouth daily. 04/15/19  Yes Tonia Ghent, MD  cetirizine (ZYRTEC) 10 MG tablet Take 10 mg by mouth as needed for allergies.   Yes [provider]  clopidogrel (PLAVIX) 75 MG tablet Take 1 tablet (75 mg total) by mouth daily. 04/15/19  Yes Tonia Ghent, MD  enalapril (VASOTEC) 10 MG tablet Take 1.5 tablets (15 mg total) by mouth daily. 12/05/18  Yes Tonia Ghent, MD  fluticasone [facility]) 50 MCG/ACT nasal spray Place 2 sprays into both nostrils daily. 09/10/18  Yes Tonia Ghent, MD  hydrochlorothiazide (MICROZIDE) 12.5 MG capsule Take 1 capsule (12.5 mg total) by mouth every morning. 09/20/18  Yes Tonia Ghent, MD  levothyroxine (SYNTHROID) 50 MCG tablet TAKE 1 TABLET BY MOUTH  DAILY EXCEPT TAKE 2 TABLETS ON SUNDAYS AND WEDNESDAYS. (TOTAL 9  TABLETS PER WEEK) Patient taking differently: Take 50 mcg by mouth See admin instructions. TAKE 1 TABLET BY MOUTH  DAILY EXCEPT TAKE 2 TABLETS ON SUNDAYS AND WEDNESDAYS. (TOTAL 9  TABLETS PER WEEK) 09/20/18  Yes Tonia Ghent, MD  Liniments (BLUE-EMU SUPER STRENGTH EX) Apply 1 application topically as needed (knee pain).   Yes [provider]  metoprolol tartrate (LOPRESSOR) 100 MG tablet Take 1/2 tablet by mouth in the morning and 1 tablet in the evening. Patient taking differently: Take 50-100 mg by mouth See admin instructions. Take 1/2 tablet by mouth in the morning and 1 tablet in the evening. 09/20/18  Yes Tonia Ghent, MD  pravastatin (PRAVACHOL) 10 MG tablet Take 1 tablet (10 mg total) by mouth daily. 09/20/18  Yes  Tonia Ghent, MD    Allergies    Aspirin, Codeine, and Lipitor [atorvastatin]  Review of Systems   Review of Systems  Constitutional: Negative for appetite change and fatigue.  HENT: Negative for congestion, ear discharge and sinus pressure.   Eyes: Negative for discharge.  Respiratory: Negative for cough.   Cardiovascular: Negative for chest pain.  Gastrointestinal: Positive for hematochezia. Negative for abdominal pain and diarrhea.       Rectal bleeding  Genitourinary: Negative for frequency and hematuria.  Musculoskeletal: Negative for back pain.  Skin: Negative for rash.  Neurological: Negative for seizures and headaches.  Psychiatric/Behavioral: Negative for hallucinations.    Physical Exam Updated Vital Signs BP (!) 182/82 (BP Location: Left Arm)   Pulse 66   Temp 98 F (36.7 C) (Oral)  Resp 18   Ht 5\' 4"  (1.626 m)   Wt 49.4 kg   SpO2 100%   BMI 18.71 kg/m   Physical Exam Vitals and nursing note reviewed.  Constitutional:      Appearance: She is well-developed.  HENT:     Head: Normocephalic.     Nose: Nose normal.  Eyes:     General: No scleral icterus.    Conjunctiva/sclera: Conjunctivae normal.  Neck:     Thyroid: No thyromegaly.  Cardiovascular:     Rate and Rhythm: Normal rate and regular rhythm.     Heart sounds: No murmur. No friction rub. No gallop.   Pulmonary:     Breath sounds: No stridor. No wheezing or rales.  Chest:     Chest wall: No tenderness.  Abdominal:     General: There is no distension.     Tenderness: There is no abdominal tenderness. There is no rebound.  Genitourinary:    Comments: Maroon stool heme positive Musculoskeletal:        General: Normal range of motion.     Cervical back: Neck supple.  Lymphadenopathy:     Cervical: No cervical adenopathy.  Skin:    Findings: No erythema or rash.  Neurological:     Mental Status: She is oriented to person, place, and time.     Motor: No abnormal muscle tone.      Coordination: Coordination normal.  Psychiatric:        Behavior: Behavior normal.     ED Results / Procedures / Treatments   Labs (all labs ordered are listed, but only abnormal results are displayed) Labs Reviewed  COMPREHENSIVE METABOLIC PANEL - Abnormal; Notable for the following components:      Result Value   Sodium 134 (*)    Total Bilirubin 0.1 (*)    All other components within normal limits  CBC - Abnormal; Notable for the following components:   Hemoglobin 11.6 (*)    All other components within normal limits  POC OCCULT Jill, ED - Abnormal; Notable for the following components:   Fecal Occult Bld POSITIVE (*)    All other components within normal limits  TYPE AND SCREEN    EKG None  Radiology VAS [place] ABI WITH/WO TBI  Result Date: 04/26/2019 LOWER EXTREMITY DOPPLER STUDY Indications: Claudication, and peripheral artery disease. High Risk Factors: Hypertension, hyperlipidemia, current smoker. Other Factors: Patient complains of constant lower extremity pain, right worse                than left.  Vascular Interventions: Left CIA and aortic stent 03/18/2016. Performing Technologist: Delorise Shiner RVT  Examination Guidelines: A complete evaluation includes at minimum, Doppler waveform signals and systolic Jill pressure reading at the level of bilateral brachial, anterior tibial, and posterior tibial arteries, when vessel segments are accessible. Bilateral testing is considered an integral part of a complete examination. Photoelectric Plethysmograph (PPG) waveforms and toe systolic pressure readings are included as required and additional duplex testing as needed. Limited examinations for reoccurring indications may be performed as noted.  ABI Findings: +---------+------------------+-----+----------+--------+ Right    Rt Pressure (mmHg)IndexWaveform  Comment  +---------+------------------+-----+----------+--------+ Brachial 159                                        +---------+------------------+-----+----------+--------+ ATA      82  0.51                    +---------+------------------+-----+----------+--------+ PTA      101               0.62 monophasic         +---------+------------------+-----+----------+--------+ DP                              monophasic         +---------+------------------+-----+----------+--------+ Great Toe32                0.20                    +---------+------------------+-----+----------+--------+ +---------+------------------+-----+----------+-------+ Left     Lt Pressure (mmHg)IndexWaveform  Comment +---------+------------------+-----+----------+-------+ Brachial 162                                      +---------+------------------+-----+----------+-------+ ATA      110               0.68                   +---------+------------------+-----+----------+-------+ PTA      120               0.74 monophasic        +---------+------------------+-----+----------+-------+ DP                              monophasic        +---------+------------------+-----+----------+-------+ Great Toe33                0.20                   +---------+------------------+-----+----------+-------+ +-------+-----------+-----------+------------+------------+ ABI/TBIToday's ABIToday's TBIPrevious ABIPrevious TBI +-------+-----------+-----------+------------+------------+ Right  0.62       0.20       0.71        0.46         +-------+-----------+-----------+------------+------------+ Left   0.74       0.20       0.82        0.53         +-------+-----------+-----------+------------+------------+ Bilateral ABIs appear essentially unchanged compared to prior study on 11/09/2017.  Summary: Right: Resting right ankle-brachial index indicates moderate right lower extremity arterial disease. The right toe-brachial index is abnormal. RT great toe pressure = 32 mmHg. Left: Resting left  ankle-brachial index indicates moderate left lower extremity arterial disease. The left toe-brachial index is abnormal. LT Great toe pressure = 33 mmHg.  *See table(s) above for measurements and observations.  Electronically signed by Servando Snare MD on 04/26/2019 at 12:08:41 PM.    Final    VAS US CAROTID  Result Date: 04/26/2019 Carotid Arterial Duplex Study Indications:       Carotid artery disease. Risk Factors:      Hypertension, hyperlipidemia, current smoker. Comparison Study:  Prior carotid duplex 11/09/2017 showed 1-39% RICA stenosis                    and 123456 LICA stenosis. Performing Technologist: Delorise Shiner RVT  Examination Guidelines: A complete evaluation includes B-mode imaging, spectral Doppler, color Doppler, and power Doppler as needed of all accessible portions of each vessel. Bilateral testing is considered an integral  part of a complete examination. Limited examinations for reoccurring indications may be performed as noted.  Right Carotid Findings: +----------+--------+--------+--------+--------------------------+--------+           PSV cm/sEDV cm/sStenosisPlaque Description        Comments +----------+--------+--------+--------+--------------------------+--------+ CCA Prox  78      11      <50%    calcific                           +----------+--------+--------+--------+--------------------------+--------+ CCA Mid   70      14                                                 +----------+--------+--------+--------+--------------------------+--------+ CCA Distal63      12      <50%    smooth                             +----------+--------+--------+--------+--------------------------+--------+ ICA Prox  103     16      1-39%   calcific                           +----------+--------+--------+--------+--------------------------+--------+ ICA Mid   83      22                                                  +----------+--------+--------+--------+--------------------------+--------+ ICA Distal116     26                                                 +----------+--------+--------+--------+--------------------------+--------+ ECA       99      0               heterogenous and irregular         +----------+--------+--------+--------+--------------------------+--------+ +----------+--------+-------+----------------+-------------------+           PSV cm/sEDV cmsDescribe        Arm Pressure (mmHG) +----------+--------+-------+----------------+-------------------+ [id number]     0      Multiphasic, WNL                    +----------+--------+-------+----------------+-------------------+ +---------+--------+--+--------+--+---------+ VertebralPSV cm/s65EDV cm/s10Antegrade +---------+--------+--+--------+--+---------+  Left Carotid Findings: +----------+--------+--------+--------+----------------------+--------+           PSV cm/sEDV cm/sStenosisPlaque Description    Comments +----------+--------+--------+--------+----------------------+--------+ CCA Prox  95      23              diffuse                        +----------+--------+--------+--------+----------------------+--------+ CCA Mid   84      19      <50%    calcific                       +----------+--------+--------+--------+----------------------+--------+ CCA Distal83      18                                             +----------+--------+--------+--------+----------------------+--------+  ICA Prox  123     23      1-39%   calcific and irregular         +----------+--------+--------+--------+----------------------+--------+ ICA Mid   149     41      40-59%                                 +----------+--------+--------+--------+----------------------+--------+ ICA Distal117     21                                              +----------+--------+--------+--------+----------------------+--------+ ECA       348     0       >50%    calcific                       +----------+--------+--------+--------+----------------------+--------+ +----------+--------+--------+----------------+-------------------+           PSV cm/sEDV cm/sDescribe        Arm Pressure (mmHG) +----------+--------+--------+----------------+-------------------+ [id number]     0       Multiphasic, WNL                    +----------+--------+--------+----------------+-------------------+ +---------+--------+--+--------+--+---------+ VertebralPSV cm/s83EDV cm/s23Antegrade +---------+--------+--+--------+--+---------+   Summary: Right Carotid: Velocities in the right ICA are consistent with a 1-39% stenosis.                Non-hemodynamically significant plaque <50% noted in the CCA. The                ECA appears <50% stenosed. Left Carotid: Velocities in the left ICA are consistent with a 40-59% stenosis.               Non-hemodynamically significant plaque <50% noted in the CCA. The               ECA appears >50% stenosed. Vertebrals:  Bilateral vertebral arteries demonstrate antegrade flow. Subclavians: Normal flow hemodynamics were seen in bilateral subclavian              arteries. *See table(s) above for measurements and observations.     Preliminary     Procedures Procedures (including critical care time)  Medications Ordered in ED Medications  sodium chloride 0.9 % bolus 500 mL (0 mLs Intravenous Stopped 04/26/19 1708)    ED Course  I have reviewed the triage vital signs and the nursing notes.  Pertinent labs & imaging results that were available during my care of the patient were reviewed by me and considered in my medical decision making (see chart for details). Patient with rectal bleeding.  Patient is not orthostatic and she is only lost a half of gram of Jill.  She is not bleeding now.  I spoke with Dr. Fuller Plan with GI and  he felt like the patient could stop her Plavix and aspirin for 5 days and follow-up the beginning of the week if she is competent.  Patient does want to go home and she understands the plan   MDM Rules/Calculators/A&P                     Final Clinical Impression(s) / ED Diagnoses Final diagnoses:  Jill in stool    Rx / DC Orders ED Discharge Orders    None  Milton Ferguson, MD 04/26/19 1800

## 2019-04-26 NOTE — Discharge Instructions (Signed)
Stop taking your aspirin and your Plavix for 5 days.  Follow-up with Dr. Ardis Hughs at [facility] gastroenterology the beginning of next week.  Call Monday for an appointment.  Return if getting worse

## 2019-04-28 ENCOUNTER — Telehealth: Payer: Self-pay | Admitting: Family Medicine

## 2019-04-28 NOTE — Telephone Encounter (Signed)
Opened in error

## 2019-04-29 ENCOUNTER — Other Ambulatory Visit: Payer: Self-pay | Admitting: *Deleted

## 2019-04-29 ENCOUNTER — Telehealth: Payer: Self-pay | Admitting: Gastroenterology

## 2019-04-29 DIAGNOSIS — I779 Disorder of arteries and arterioles, unspecified: Secondary | ICD-10-CM

## 2019-04-29 DIAGNOSIS — I6523 Occlusion and stenosis of bilateral carotid arteries: Secondary | ICD-10-CM

## 2019-04-29 NOTE — Telephone Encounter (Signed)
3/12 appt made to see Jill Franco at 130 pm for post ED follow up rectal bleeding. .  Pt advised.

## 2019-05-03 ENCOUNTER — Other Ambulatory Visit (INDEPENDENT_AMBULATORY_CARE_PROVIDER_SITE_OTHER): Payer: Medicare Other

## 2019-05-03 ENCOUNTER — Ambulatory Visit (INDEPENDENT_AMBULATORY_CARE_PROVIDER_SITE_OTHER): Payer: Medicare Other | Admitting: Nurse Practitioner

## 2019-05-03 ENCOUNTER — Encounter: Payer: Self-pay | Admitting: Nurse Practitioner

## 2019-05-03 VITALS — BP 102/60 | HR 60 | Temp 98.4°F | Ht 64.0 in | Wt 109.8 lb

## 2019-05-03 DIAGNOSIS — K921 Melena: Secondary | ICD-10-CM

## 2019-05-03 LAB — IBC PANEL
Iron: 14 ug/dL — ABNORMAL LOW (ref 42–145)
Saturation Ratios: 3 % — ABNORMAL LOW (ref 20.0–50.0)
Transferrin: 328 mg/dL (ref 212.0–360.0)

## 2019-05-03 LAB — FERRITIN: Ferritin: 12 ng/mL (ref 10.0–291.0)

## 2019-05-03 LAB — CBC
HCT: 33.7 % — ABNORMAL LOW (ref 36.0–46.0)
Hemoglobin: 11 g/dL — ABNORMAL LOW (ref 12.0–15.0)
MCHC: 32.7 g/dL (ref 30.0–36.0)
MCV: 85.3 fl (ref 78.0–100.0)
Platelets: 267 10*3/uL (ref 150.0–400.0)
RBC: 3.95 Mil/uL (ref 3.87–5.11)
RDW: 14.6 % (ref 11.5–15.5)
WBC: 7.7 10*3/uL (ref 4.0–10.5)

## 2019-05-03 NOTE — Patient Instructions (Addendum)
If you are age 83 or older, your body mass index should be between 23-30. Your Body mass index is 18.85 kg/m. If this is out of the aforementioned range listed, please consider follow up with your Primary Care Provider.  If you are age 76 or younger, your body mass index should be between 19-25. Your Body mass index is 18.85 kg/m. If this is out of the aformentioned range listed, please consider follow up with your Primary Care Provider.   Your provider has requested that you go to the basement level for lab work before leaving today. Press "B" on the elevator. The lab is located at the first door on the left as you exit the elevator.  We have given you a Hemoccult slide kit to complete if you have any more black stool.   Restart your Plavix and follow up with your Vascular specialist regarding further aspirin use.  Due to recent changes in healthcare laws, you may see the results of your imaging and laboratory studies on MyChart before your provider has had a chance to review them.  We understand that in some cases there may be results that are confusing or concerning to you. Not all laboratory results come back in the same time frame and the provider may be waiting for multiple results in order to interpret others.  Please give [place] 48 hours in order for your provider to thoroughly review all the results before contacting the office for clarification of your results.   Thank you for choosing [facility] Noralyn Pick, CRNP

## 2019-05-03 NOTE — Progress Notes (Signed)
I agree with the above note, plan 

## 2019-05-03 NOTE — Progress Notes (Signed)
05/03/2019 Jill Franco [id number] 1934-06-11   Chief Complaint: black stool and rectal bleeding   History of Present Illness: Jill Franco is a 85 year old female with a past medical history of hypertension, hyperlipidemia, hypothyroidism, peripheral arterial disease s/p iliac artery/aortic stent 2018 on Plavix and ASA. She passed a black solid stool with a moderate amount of bright red blood which turned the toilet water red x 2 days so she presented to [facility] for further evaluation on 04/05/2019. Admission Hg 12.8 (base line Hg 14.2 in 2019).  FOBT was positive. An abdominal/pelvic CT did not show any acute abd/pelvic infectious or inflammatory process, no evidence of diverticular disease or ischemic colitis.  She underwent a colonoscopy by Dr. Ardis Hughs 04/08/2019 which showed three sessile TA polyps 3 - 1cm removed from the transverse colon, a 6-80mm semipedunculated TA polyp in the rectum, may have been the site of bleeding and incidental cecal AVMs without evidence of  bleeding. She was discharged home later the same day. She restarted ASA and Plavix on 04/06/2019. She saw a small amount of red blood on the toilet tissue for a few days after her colonoscopy which abated. She reported feeling relatively well until 04/25/2019. She passed a similar black solid stool with a moderate amount of red blood in the toilet. No associated abdominal or rectal pain. The next morning she passed a similar bloody BM so she presented back to [facility] 04/26/2019. No abdominal or rectal pain. Labs in the ED showed Hg 11.6. FOBT +.  She was hemodynamically stable. She was advised to hold her Plavix and ASA for 5 days and to follow up in our office for further recommendations. Her last dose of Plavix and ASA were on 3/5. She presents today accompanied by her daughter. She complains of feeling slightly dizzy. She is coughing up clear sputum. No hemoptysis. No chest pain. No heartburn or  dysphagia. No upper or lower abdominal pain. No rectal pain. She is passing a normal brown formed stool once daily since her most recent ER visit. No history of ulcers. She does not take Pepto bismol or iron. No family history of gastric or colon cancer.   CBC Latest Ref Rng & Units 04/26/2019 04/15/2019 04/09/2019  WBC 4.0 - 10.5 K/uL 6.3 7.1 9.2  Hemoglobin 12.0 - 15.0 g/dL 11.6(L) 12.1 11.9(L)  Hematocrit 36.0 - 46.0 % 38.0 37.2 36.8  Platelets 150 - 400 K/uL 309 273.0 274.0   CMP Latest Ref Rng & Units 04/26/2019 04/15/2019 04/08/2019  Glucose 70 - 99 mg/dL 97 103(H) 98  BUN 8 - 23 mg/dL 21 13 7(L)  Creatinine 0.44 - 1.00 mg/dL 0.72 0.70 0.58  Sodium 135 - 145 mmol/L 134(L) 132(L) 137  Potassium 3.5 - 5.1 mmol/L 3.8 4.9 4.0  Chloride 98 - 111 mmol/L 100 97 105  CO2 22 - 32 mmol/L 25 28 21(L)  Calcium 8.9 - 10.3 mg/dL 8.9 9.4 9.0  Total Protein 6.5 - 8.1 g/dL 7.3 - -  Total Bilirubin 0.3 - 1.2 mg/dL 0.1(L) - -  Alkaline Phos 38 - 126 U/L 87 - -  AST 15 - 41 U/L 25 - -  ALT 0 - 44 U/L 19 - -    Abdominal/Pelvic CT 04/05/2019: 1. No acute abdominal/pelvic findings, mass lesions or lymphadenopathy. 2. 8.5 mm left lower lobe pulmonary nodule is indeterminate. Recommend followup noncontrast chest CT in 3 months. 3. Advanced atherosclerotic calcifications involving the aorta and branch vessels.  4. Advanced bilateral hip joint degenerative changes with a moderate-sized right hip joint effusion.  Colonoscopy 04/08/2019: Two 1cm non-bleeding cecal AVMs, not treated. Three sessile polyps were noted in the transverse segment ranging in size from 75mm to 1cm. All were removed with a cold snare. Resection and retrieval were complete. There was not excessive, unusual post polypectomy bleeding at any site. One semipedunculated polyp was noted in the mid rectum, 6-6mm across. This was removed with a cold snare. Resection and retreival were complete. Again, no unusual post polypectomy bleeding at the  site. The exam was otherwise without abnormality on direct and retroflexion views.   Current Outpatient Medications on File Prior to Visit  Medication Sig Dispense Refill  . cetirizine (ZYRTEC) 10 MG tablet Take 10 mg by mouth as needed for allergies.    Marland Kitchen enalapril (VASOTEC) 10 MG tablet Take 1.5 tablets (15 mg total) by mouth daily. 45 tablet 0  . fluticasone (FLONASE) 50 MCG/ACT nasal spray Place 2 sprays into both nostrils daily.    . hydrochlorothiazide (MICROZIDE) 12.5 MG capsule Take 1 capsule (12.5 mg total) by mouth every morning. 90 capsule 1  . levothyroxine (SYNTHROID) 50 MCG tablet TAKE 1 TABLET BY MOUTH  DAILY EXCEPT TAKE 2 TABLETS ON SUNDAYS AND WEDNESDAYS. (TOTAL 9  TABLETS PER WEEK) (Patient taking differently: Take 50 mcg by mouth See admin instructions. TAKE 1 TABLET BY MOUTH  DAILY EXCEPT TAKE 2 TABLETS ON SUNDAYS AND WEDNESDAYS. (TOTAL 9  TABLETS PER WEEK)) 108 tablet 3  . Liniments (BLUE-EMU SUPER STRENGTH EX) Apply 1 application topically as needed (knee pain).    . metoprolol tartrate (LOPRESSOR) 100 MG tablet Take 1/2 tablet by mouth in the morning and 1 tablet in the evening. (Patient taking differently: Take 50-100 mg by mouth See admin instructions. Take 1/2 tablet by mouth in the morning and 1 tablet in the evening.) 135 tablet 1  . pravastatin (PRAVACHOL) 10 MG tablet Take 1 tablet (10 mg total) by mouth daily. 90 tablet 2  . aspirin 81 MG EC tablet Take 1 tablet (81 mg total) by mouth daily. (Patient not taking: Reported on 05/03/2019)    . clopidogrel (PLAVIX) 75 MG tablet Take 1 tablet (75 mg total) by mouth daily. (Patient not taking: Reported on 05/03/2019)     No current facility-administered medications on file prior to visit.   Allergies  Allergen Reactions  . Aspirin Other (See Comments)    REACTION: Uncoated Stomach upset on empty stomach  . Codeine Nausea Only and Other (See Comments)    Pill needs to be E.coated  . Lipitor [Atorvastatin] Other (See  Comments)    myalgia    Current Medications, Allergies, Past Medical History, Past Surgical History, Family History and Social History were reviewed in [employer] record.   Physical Exam: BP 102/60   Pulse 60   Temp 98.4 F (36.9 C)   Ht 5\' 4"  (1.626 m)   Wt 109 lb 12.8 oz (49.8 kg)   BMI 18.85 kg/m  General: Well developed  85 year old female in no acute distress. Head: Normocephalic and atraumatic. Eyes:  No scleral icterus. Conjunctiva pink . Ears: Normal auditory acuity. Lungs: Clear throughout to auscultation. Heart: Regular rate and rhythm, no murmur. Abdomen: Soft, nontender and nondistended. No masses or hepatomegaly. Normal bowel sounds x 4 quadrants. + bruit loudest LUQ/left mid abdomen.  Rectal: No external hemorrhoids. No mass. Stool golden brown guaiac negative.  Musculoskeletal: Symmetrical with no gross deformities. Extremities:  No edema. Neurological: Alert oriented x 4. No focal deficits.  Psychological:  Alert and cooperative. Normal mood and affect  Assessment and Recommendations:  7. 85 year old female with episodic black solid stool with painless hematochezia. Plavix and ASA on hold since 3/5. Colonoscopy 04/05/2019, 4 tubular adenomatous polyps were removed (site of rectal polyp 6-54mm possible source of hematochezia). No diverticulosis. No history of PUD.  -CBC and iron panel today -Heme slides patient to complete if stools are dark or black -If black stools recur then EGD to be scheduled -If bright red blood recurs consider flex sigmoidoscopy -Ok to restart Plavix today, hold ASA for now as discussed with Dr. Ardis Hughs -Patient to contact her vascular surgeon to inquire of ASA can be stopped -To the ER if she develops severe rectal bleeding, melena, CP or worsening SOB.  2. Normocytic Anemia. Hg 11.6 down from 12.1. HCT 38. MCV 91.3.  -See plan in # 1  3. History of peripheral arterial disease. Plavix and ASA on Hold. -See Plan in  # 1

## 2019-05-13 ENCOUNTER — Other Ambulatory Visit: Payer: Self-pay

## 2019-05-13 DIAGNOSIS — D509 Iron deficiency anemia, unspecified: Secondary | ICD-10-CM

## 2019-05-13 MED ORDER — FERROUS SULFATE 325 (65 FE) MG PO TABS: 325.0000 mg | ORAL_TABLET | Freq: Every day | ORAL | 3 refills | Status: DC

## 2019-05-16 ENCOUNTER — Telehealth: Payer: Self-pay

## 2019-05-16 NOTE — Telephone Encounter (Signed)
Rhonda with Access nurse is calling for pt; pt has dizziness and SOB for couple of weeks but still continuing; Suanne Marker is concerned may be having GI bleeding and pt is on blood thinner. Pt is scheduled for EGD 06/03/19 with Dr Ardis Hughs. Suanne Marker said pt needs to be seen within 4 hrs. Morey Hummingbird said Butch Penny CMA said cannot bring into office with SOB and congestion; Suanne Marker will advise pt to go to ED for eval. FYI to Dr Damita Dunnings who is out of office and Dr Danise Mina who is in office this AM.

## 2019-05-16 NOTE — Telephone Encounter (Signed)
Noted. Thanks.

## 2019-05-16 NOTE — Telephone Encounter (Signed)
I spoke with pt and she is feeling better, not having SOB now and pt will call 911 if she needs to.pt said she has not had any bleeding in 2 wks. FYI to DR Danise Mina. Pt said she has endoscopic appt on 06/03/19.

## 2019-05-16 NOTE — Telephone Encounter (Signed)
[facility] - Client TELEPHONE ADVICE RECORD [facility] Patient Name: Jill Franco Gender: Female DOB: 02-06-1935 Age: 85 Y 11 M 25 D Return Phone Number: [phone] (Primary) Address: City/State/Zip: [place] [address] Client [name] - Client Client Site [facility] - Day Physician [facility] - PHYSICIAN, NOT LISTED- MD Contact Type Call Who Is Calling Patient / Member / Family / Caregiver Call Type Triage / Clinical Relationship To Patient Self Return Phone Number [phone] (Primary) Chief Complaint BREATHING - shortness of breath or sounds breathless Reason for Call Symptomatic / Request for [name] states she has had shortness of breath when she wakes up in the morning. It gets better during the day. It's been happening for the past 2 weeks. Sees [name]. Translation No Nurse Assessment Nurse: Harlow Mares, RN, Suanne Marker Date/Time (Eastern Time): 05/16/2019 9:52:11 AM Confirm and document reason for call. If symptomatic, describe symptoms. ---Caller states she has had shortness of breath when she wakes up in the morning. It gets better during the day. It's been happening for the past 2 weeks. Sees [name]. Has the patient had close contact with a person known or suspected to have the novel coronavirus illness OR traveled / lives in area with major community spread (including international travel) in the last 14 days from the onset of symptoms? * If Asymptomatic, screen for exposure and travel within the last 14 days. ---No Does the patient have any new or worsening symptoms? ---Yes Will a triage be completed? ---Yes Related visit to physician within the last 2 weeks? ---No Does the PT have any chronic conditions? (i.e. diabetes, asthma, this includes High risk factors for pregnancy, etc.) ---Yes List chronic conditions. ---HTN; blood thinner; spot on  her lung (found in the hospital a few weeks ago); colon polyps; recent GI bleed (scheduled for EGD) Is this a behavioral health or substance abuse call? ---No Guidelines Guideline Title Affirmed Question Affirmed Notes Nurse Date/Time Eilene Ghazi Time) Breathing Difficulty [1] MILD difficulty breathing (e.g., minimal/ Harlow Mares, RN, Suanne Marker 05/16/2019 9:55:41 AM PLEASE NOTE: All timestamps contained within this report are represented as [place] Standard Time. CONFIDENTIALTY NOTICE: This fax transmission is intended only for the addressee. It contains information that is legally privileged, confidential or otherwise protected from use or disclosure. If you are not the intended recipient, you are strictly prohibited from reviewing, disclosing, copying using or disseminating any of this information or taking any action in reliance on or regarding this information. If you have received this fax in error, please notify us immediately by telephone so that we can arrange for its return to [place]. Phone: [phone], Toll-Free: [phone], Fax: [phone] Page: 2 of 2 Call Id: [id number] Guidelines Guideline Title Affirmed Question Affirmed Notes Nurse Date/Time Eilene Ghazi Time) no SOB at rest, SOB with walking, pulse <100) AND [2] NEW-onset or WORSE than normal Disp. Time Eilene Ghazi Time) Disposition Final User 05/16/2019 9:50:45 AM Send to Urgent Queue [name] 05/16/2019 10:09:51 AM See HCP within 4 Hours (or PCP triage) Yes Harlow Mares, RN, Rosalyn Charters Disagree/Comply Comply Caller Understands Yes PreDisposition Call Doctor Care Advice Given Per Guideline SEE HCP WITHIN 4 HOURS (OR PCP TRIAGE): * IF OFFICE WILL BE OPEN: You need to be seen within the next 3 or 4 hours. Call your doctor (or NP/PA) now or as soon as the office opens. CALL BACK IF: * You become worse. CARE ADVICE given per Breathing Difficulty (Adult) guideline. Comments User: Suanne Marker,  Harlow Mares, RN Date/Time Eilene Ghazi Time):  05/16/2019 10:09:14 AM [place] nurse in office recommended that patient be seen in the ED. Referrals GO TO FACILITY UNDECIDED

## 2019-05-19 ENCOUNTER — Other Ambulatory Visit: Payer: Self-pay | Admitting: Family Medicine

## 2019-05-19 NOTE — Telephone Encounter (Signed)
Noted. Thanks.

## 2019-05-21 ENCOUNTER — Ambulatory Visit: Payer: Medicare Other | Attending: Internal Medicine

## 2019-05-21 DIAGNOSIS — Z23 Encounter for immunization: Secondary | ICD-10-CM

## 2019-05-21 NOTE — Progress Notes (Signed)
   Covid-19 Vaccination Clinic  Name:  Jill Franco    MRN: [id number] DOB: [birth year]  05/21/2019  [name] was observed post Covid-19 immunization for 15 minutes without incident. She was provided with Vaccine Information Sheet and instruction to access the V-Safe system.   [name] was instructed to call 911 with any severe reactions post vaccine: Marland Kitchen Difficulty breathing  . Swelling of face and throat  . A fast heartbeat  . A bad rash all over body  . Dizziness and weakness   Immunizations Administered    Name Date Dose VIS Date Route   Pfizer COVID-19 Vaccine 05/21/2019  8:59 AM 0.3 mL 02/01/2019 Intramuscular   Manufacturer: [place]   Lot: [id number]   [place]: [id number]

## 2019-05-23 ENCOUNTER — Telehealth: Payer: Self-pay | Admitting: General Surgery

## 2019-05-23 NOTE — Telephone Encounter (Signed)
[facility] Pre-operative Risk Assessment     Request for surgical clearance:     Endoscopy Procedure  What type of surgery is being performed?     Endoscopy  When is this surgery scheduled?     06/03/2019  What type of clearance is required ?   Pharmacy  Are there any medications that need to be held prior to surgery and how long? Plavix for 3-5 days  Practice name and name of physician performing surgery?      [facility]  What is your office phone and fax number?      Phone- [phone]  [phone]  Anesthesia type (None, local, MAC, general) ?       MAC

## 2019-05-23 NOTE — Telephone Encounter (Signed)
-----   Message from Mohammed Kindle, RN sent at 05/23/2019  8:09 AM EDT ----- Please follow up with this request and provide information as the patient has been scheduled for a pre visit. Thank you Bre ----- Message ----- From: Levonne Spiller, RN Sent: 05/23/2019   8:02 AM EDT To: Mohammed Kindle, RN  Good morning! This patient is scheduled for an EGD she is on Plavix. I may have missed the hold info??

## 2019-05-23 NOTE — Telephone Encounter (Signed)
Patients daughter Thayer Headings stated that her mother went back on Plavix. We will send a medical clearance to Dr Oneida Alar for anticoag clearance.

## 2019-05-29 ENCOUNTER — Telehealth: Payer: Self-pay

## 2019-05-29 ENCOUNTER — Other Ambulatory Visit: Payer: Self-pay

## 2019-05-29 ENCOUNTER — Ambulatory Visit (AMBULATORY_SURGERY_CENTER): Payer: Self-pay

## 2019-05-29 VITALS — Temp 95.1°F | Ht 64.0 in | Wt 110.0 lb

## 2019-05-29 DIAGNOSIS — K921 Melena: Secondary | ICD-10-CM

## 2019-05-29 DIAGNOSIS — D509 Iron deficiency anemia, unspecified: Secondary | ICD-10-CM

## 2019-05-29 NOTE — Telephone Encounter (Signed)
Left a voicemail message for Jill Franco, nurse for Dr Oneida Alar, explaining that I need to see where they are with her Plavix clearance. The patient is scheduled for an Endoscopy on 06/03/2019 and we would need her to stop her plavix today.

## 2019-05-29 NOTE — Telephone Encounter (Signed)
We do not see this patient. Dr. Oneida Alar manages his plavix.   I will remove from the preop pool.

## 2019-05-29 NOTE — Telephone Encounter (Signed)
Spoke to [place] at [facility]. Per our PA, pt can hold Plavix for her GI procedure on 4/12.

## 2019-05-29 NOTE — Progress Notes (Signed)
No allergies to soy or egg Pt is not on diet pills  Pt is on plavix-GI office has reached out to see if it can be held with prescribing doctor.  Pt is aware that someone should be reaching out to her concerning this.  Denies issues with sedation/intubation Denies atrial flutter/fib Denies constipation   Emmi instructions given to pt  Pt is aware of Covid safety and care partner requirements.

## 2019-05-30 ENCOUNTER — Encounter: Payer: Self-pay | Admitting: Gastroenterology

## 2019-06-03 ENCOUNTER — Encounter: Payer: Self-pay | Admitting: Gastroenterology

## 2019-06-03 ENCOUNTER — Other Ambulatory Visit: Payer: Self-pay

## 2019-06-03 ENCOUNTER — Ambulatory Visit (AMBULATORY_SURGERY_CENTER): Payer: Medicare Other | Admitting: Gastroenterology

## 2019-06-03 VITALS — BP 147/69 | HR 55 | Temp 96.6°F | Resp 20 | Ht 64.0 in | Wt 110.0 lb

## 2019-06-03 DIAGNOSIS — K921 Melena: Secondary | ICD-10-CM | POA: Diagnosis not present

## 2019-06-03 DIAGNOSIS — D509 Iron deficiency anemia, unspecified: Secondary | ICD-10-CM

## 2019-06-03 DIAGNOSIS — K31819 Angiodysplasia of stomach and duodenum without bleeding: Secondary | ICD-10-CM | POA: Diagnosis not present

## 2019-06-03 MED ORDER — SODIUM CHLORIDE 0.9 % IV SOLN: 500.0000 mL | Freq: Once | INTRAVENOUS | Status: DC

## 2019-06-03 NOTE — Patient Instructions (Signed)
Ok to resume your Plavix today. You should stay on once daily iron supplement indefinitely.  YOU HAD AN ENDOSCOPIC PROCEDURE TODAY AT [facility]:   Refer to the procedure report that was given to you for any specific questions about what was found during the examination.  If the procedure report does not answer your questions, please call your gastroenterologist to clarify.  If you requested that your care partner not be given the details of your procedure findings, then the procedure report has been included in a sealed envelope for you to review at your convenience later.  YOU SHOULD EXPECT: Some feelings of bloating in the abdomen. Passage of more gas than usual.  Walking can help get rid of the air that was put into your GI tract during the procedure and reduce the bloating. If you had a lower endoscopy (such as a colonoscopy or flexible sigmoidoscopy) you may notice spotting of blood in your stool or on the toilet paper. If you underwent a bowel prep for your procedure, you may not have a normal bowel movement for a few days.  Please Note:  You might notice some irritation and congestion in your nose or some drainage.  This is from the oxygen used during your procedure.  There is no need for concern and it should clear up in a day or so.  SYMPTOMS TO REPORT IMMEDIATELY:    Following upper endoscopy (EGD)  Vomiting of blood or coffee ground material  New chest pain or pain under the shoulder blades  Painful or persistently difficult swallowing  New shortness of breath  Fever of 100F or higher  Black, tarry-looking stools  For urgent or emergent issues, a gastroenterologist can be reached at any hour by calling [phone]. Do not use MyChart messaging for urgent concerns.    DIET:  We do recommend a small meal at first, but then you may proceed to your regular diet.  Drink plenty of fluids but you should avoid alcoholic beverages for 24 hours.  ACTIVITY:  You  should plan to take it easy for the rest of today and you should NOT DRIVE or use heavy machinery until tomorrow (because of the sedation medicines used during the test).    FOLLOW UP: Our staff will call the number listed on your records 48-72 hours following your procedure to check on you and address any questions or concerns that you may have regarding the information given to you following your procedure. If we do not reach you, we will leave a message.  We will attempt to reach you two times.  During this call, we will ask if you have developed any symptoms of COVID 19. If you develop any symptoms (ie: fever, flu-like symptoms, shortness of breath, cough etc.) before then, please call [phone].  If you test positive for Covid 19 in the 2 weeks post procedure, please call and report this information to [place].    If any biopsies were taken you will be contacted by phone or by letter within the next 1-3 weeks.  Please call us at [phone] if you have not heard about the biopsies in 3 weeks.    SIGNATURES/CONFIDENTIALITY: You and/or your care partner have signed paperwork which will be entered into your electronic medical record.  These signatures attest to the fact that that the information above on your After Visit Summary has been reviewed and is understood.  Full responsibility of the confidentiality of this discharge information lies with you  and/or your care-partner.

## 2019-06-03 NOTE — Progress Notes (Signed)
Called to room to assist during endoscopic procedure.  Patient ID and intended procedure confirmed with present staff. Received instructions for my participation in the procedure from the performing physician.  

## 2019-06-03 NOTE — Progress Notes (Signed)
Report to PACU, RN, vss, BBS= Clear.  

## 2019-06-03 NOTE — Progress Notes (Signed)
Pt's states no medical or surgical changes since previsit or office visit.  ADB - temp KA - vitals

## 2019-06-03 NOTE — Op Note (Signed)
[facility] Patient Name: Jill Franco Procedure Date: 06/03/2019 3:28 PM MRN: [id number] Endoscopist: Milus Banister , MD Age: 85 Referring MD:  Date of Birth: [birth year] Gender: Female Account #: [id number] Procedure:                Upper GI endoscopy Indications:              Iron deficiency anemia Medicines:                Monitored Anesthesia Care Procedure:                Pre-Anesthesia Assessment:                           - Prior to the procedure, a History and Physical                            was performed, and patient medications and                            allergies were reviewed. The patient's tolerance of                            previous anesthesia was also reviewed. The risks                            and benefits of the procedure and the sedation                            options and risks were discussed with the patient.                            All questions were answered, and informed consent                            was obtained. Prior Anticoagulants: The patient has                            taken Plavix (clopidogrel), last dose was 5 days                            prior to procedure. ASA Grade Assessment: III - A                            patient with severe systemic disease. After                            reviewing the risks and benefits, the patient was                            deemed in satisfactory condition to undergo the                            procedure.  After obtaining informed consent, the endoscope was                            passed under direct vision. Throughout the                            procedure, the patient's blood pressure, pulse, and                            oxygen saturations were monitored continuously. The                            Endoscope was introduced through the mouth, and                            advanced to the second part of duodenum. The upper               GI endoscopy was accomplished without difficulty.                            The patient tolerated the procedure well. Scope In: Scope Out: Findings:                 One intermittenly slowly oozing typical appearing,                            2-17mm AVM was found in the gastric fundus. To stop                            active bleeding, the site was treated with                            snare-tip cautery and then two hemostatic clips                            were successfully placed on the site (MR                            conditional). Bleeding had stopped at the end of                            the procedure.                           The exam was otherwise without abnormality. Complications:            No immediate complications. Estimated blood loss:                            None. Estimated Blood Loss:     Estimated blood loss: none. Impression:               - One intermittently oozing gastric AVM was treated                            with  snare tip cautery followed by placement of two                            endoclips. The lesion stopped bleeding and did not                            resume bleeding even with minor scope trauma, or                            flushing the site directly with watery.                           - The examination was otherwise normal. Recommendation:           - Patient has a contact number available for                            emergencies. The signs and symptoms of potential                            delayed complications were discussed with the                            patient. Return to normal activities tomorrow.                            Written discharge instructions were provided to the                            patient.                           - Resume previous diet.                           - Continue present medications. OK to resume your                            plavix today. You should stay on once daily  iron                            supplement indefinitely.                           - Dr. Ardis Hughs' office will arrange repeat CBC in 4                            weeks. If blood counts drop, will consider repeat                            EGD at the hospital in order to treat the AVM with                            APC. Milus Banister, MD 06/03/2019 3:52:11 PM This report  has been signed electronically.

## 2019-06-05 ENCOUNTER — Telehealth: Payer: Self-pay

## 2019-06-05 ENCOUNTER — Other Ambulatory Visit: Payer: Self-pay

## 2019-06-05 DIAGNOSIS — D509 Iron deficiency anemia, unspecified: Secondary | ICD-10-CM

## 2019-06-05 NOTE — Telephone Encounter (Signed)
  Follow up Call-  Call back number 06/03/2019  Post procedure Call Back phone  # [phone]  Permission to leave phone message Yes  Some recent data might be hidden     Patient questions:  Do you have a fever, pain , or abdominal swelling? No. Pain Score  0 *  Have you tolerated food without any problems? Yes.    Have you been able to return to your normal activities? Yes.    Do you have any questions about your discharge instructions: Diet   No. Medications  No. Follow up visit  No.  Do you have questions or concerns about your Care? No.  Actions: * If pain score is 4 or above: No action needed, pain <4.  1. Have you developed a fever since your procedure? no  2.   Have you had an respiratory symptoms (SOB or cough) since your procedure? no  3.   Have you tested positive for COVID 19 since your procedure no  4.   Have you had any family members/close contacts diagnosed with the COVID 19 since your procedure?  no   If yes to any of these questions please route to Joylene John, RN and Erenest Rasher, RN

## 2019-06-23 ENCOUNTER — Telehealth: Payer: Self-pay | Admitting: Family Medicine

## 2019-06-23 DIAGNOSIS — R911 Solitary pulmonary nodule: Secondary | ICD-10-CM

## 2019-06-23 NOTE — Telephone Encounter (Signed)
Please contact patient.  Previous lung nodule noted on imaging and due for 49-month follow-up CT.  I put in the order.  Thanks.

## 2019-06-24 ENCOUNTER — Telehealth: Payer: Self-pay | Admitting: Gastroenterology

## 2019-06-24 NOTE — Telephone Encounter (Signed)
Jill Franco from [facility] scheduled pt for a CT chest without contrast.  She stated that the pt informed that she cannot have CT due to endoscopic clipping device.  Please advise.

## 2019-06-24 NOTE — Telephone Encounter (Signed)
Scheduled patient for Chest CT at [facility] Patient  says she has an endoscopic clipping device and was told she shouldn't have a CT Scan done, ive called Dr Ardis Hughs office to confirm that it is okay to have CT Chest without contrast. I will call the patient back when  I hear back from [place].

## 2019-06-24 NOTE — Telephone Encounter (Signed)
Dr Ardis Hughs I do not believe this is a problem but I want to make sure.  The pt PCP wants a CT scan and the pt states that she is unable due to the endo clip.  Please advise

## 2019-06-25 NOTE — Telephone Encounter (Signed)
Jill Franco from Dr Ardis Hughs office called me back to tell us that it is okay to have a CT scan. Patient notified that it is okay to have CT.

## 2019-06-25 NOTE — Telephone Encounter (Signed)
IT is safe for her to have CT scans. Please let her know.

## 2019-06-25 NOTE — Telephone Encounter (Signed)
Jill Franco was advised that it is safe for the pt to have CT scan with endo clips.

## 2019-06-28 ENCOUNTER — Other Ambulatory Visit: Payer: Self-pay

## 2019-06-28 ENCOUNTER — Ambulatory Visit (INDEPENDENT_AMBULATORY_CARE_PROVIDER_SITE_OTHER)
Admission: RE | Admit: 2019-06-28 | Discharge: 2019-06-28 | Disposition: A | Discharge status: Home / Self Care | Payer: Medicare Other | Source: Ambulatory Visit | Attending: Family Medicine | Admitting: Family Medicine

## 2019-06-28 DIAGNOSIS — R911 Solitary pulmonary nodule: Secondary | ICD-10-CM | POA: Diagnosis not present

## 2019-08-08 ENCOUNTER — Telehealth: Payer: Self-pay

## 2019-08-08 ENCOUNTER — Telehealth: Payer: Self-pay | Admitting: Family Medicine

## 2019-08-08 NOTE — Telephone Encounter (Signed)
err

## 2019-08-08 NOTE — Telephone Encounter (Signed)
Triage call from pt requesting an appt. She reports throbbing pain from groin to knee in BLE with Rt >Lt leg when walking. States not able to walk from one room to the other without pain and inability to go out to store because of pain. She reports occasional rest pain and at night when she turns over. Reported she is taking Plavix without any GIB symptoms compared to last OV. Advised scheduling will contact with appt for ABI + OV with Dr. Oneida Alar or PA. Pt voiced understanding

## 2019-08-29 ENCOUNTER — Other Ambulatory Visit: Payer: Self-pay | Admitting: *Deleted

## 2019-08-29 DIAGNOSIS — I779 Disorder of arteries and arterioles, unspecified: Secondary | ICD-10-CM

## 2019-09-05 ENCOUNTER — Other Ambulatory Visit: Payer: Self-pay

## 2019-09-05 ENCOUNTER — Ambulatory Visit: Payer: Medicare Other | Admitting: Vascular Surgery

## 2019-09-05 ENCOUNTER — Ambulatory Visit (HOSPITAL_COMMUNITY)
Admission: RE | Admit: 2019-09-05 | Discharge: 2019-09-05 | Disposition: A | Discharge status: Home / Self Care | Payer: Medicare Other | Source: Ambulatory Visit | Attending: Vascular Surgery | Admitting: Vascular Surgery

## 2019-09-05 ENCOUNTER — Encounter: Payer: Self-pay | Admitting: Vascular Surgery

## 2019-09-05 VITALS — BP 168/90 | HR 60 | Temp 97.9°F | Resp 20 | Ht 64.0 in | Wt 108.3 lb

## 2019-09-05 DIAGNOSIS — I779 Disorder of arteries and arterioles, unspecified: Secondary | ICD-10-CM | POA: Insufficient documentation

## 2019-09-05 DIAGNOSIS — I739 Peripheral vascular disease, unspecified: Secondary | ICD-10-CM

## 2019-09-05 NOTE — Progress Notes (Signed)
Patient name: Jill Franco MRN: [id number] DOB: [birth year] Sex: female   HPI: Jill Franco is a 85 y.o. female, who complains of pain in the right leg which extends from the right hip all the way down to the foot.  This has been going on for about 6 weeks.  She also states the pain sometimes is very concentrated around the right knee.  She does not really describe claudication in the right calf.  She has no pain in the foot.  She has no numbness or tingling in her feet.  She has had some back problems in the past but denies any numbness related to this.  She did have a back operation but it was many years ago.  She has previously had aortic and left common iliac stenting in 2018.  She also has a known high-grade left internal carotid artery stenosis which has been symptomatic in the past.  However, she refused an intervention on this in 2019.  She is currently on Plavix and a statin.  She states that she has not had any symptoms of TIA amaurosis or stroke since we saw her in 2019.  At that time her left internal carotid artery was a 40 to 60% stenosis but was fairly irregular and calcified on CT scan thought to be high risk for distal emboli.  Other medical problems include hypertension hyperlipidemia both of which are currently stable.  The patient did quit smoking 2 weeks ago with the assistance of nicotine patches.  He has her annual follow-up with her primary care physician Dr. Damita Dunnings next week.  Past Medical History:  Diagnosis Date  . Cataract    bilaterally corrected.  . CVA (cerebral vascular accident) ([place])    Pt was unaware and does not know when it happened.  Was told after some testing was done.  . Double vision   . Facial droop 10/2017   left  . History of shingles 2012  . HLD (hyperlipidemia)   . HTN (hypertension)   . Hypothyroidism   . PVD (peripheral vascular disease) ([place])   . Vision loss    temporary   Past Surgical History:  Procedure Laterality Date  .  ABDOMINAL EXPLORATION SURGERY  1950s   "they thought I was pregnant; went in to explore"  . APPENDECTOMY  childhood  . BACK SURGERY    . BREAST LUMPECTOMY Left 1960's   benign  . carotid ultrasound  06/19/2007   0-39% stenosis  . CATARACT EXTRACTION W/ INTRAOCULAR LENS IMPLANT Left 09/2016  . CATARACT EXTRACTION W/ INTRAOCULAR LENS IMPLANT Right 10/2016  . COLONOSCOPY WITH PROPOFOL N/A 04/08/2019   Procedure: COLONOSCOPY WITH PROPOFOL;  Surgeon: Milus Banister, MD;  Location: [facility];  Service: Endoscopy;  Laterality: N/A;  . DILATION AND CURETTAGE OF UTERUS  1960's X 3   miscarriages  . DOPPLER ECHOCARDIOGRAPHY  06/19/2007   Normal EF 55-70%  . ILIAC ARTERY STENT Left 03/18/2016   common iliac stent (6 x 40), aortic stent (10 x 19)/notes 03/18/2016  . LAMINECTOMY AND MICRODISCECTOMY SPINE  1960's X 2  . MOLE REMOVAL  07/10/2016  . NSVD     x1  . PERIPHERAL VASCULAR CATHETERIZATION N/A 03/18/2016   Procedure: Abdominal Aortogram w/Lower Extremity;  Surgeon: Elam Dutch, MD;  Location: [place];  Service: Cardiovascular;  Laterality: N/A;  . PERIPHERAL VASCULAR CATHETERIZATION  03/18/2016   Procedure: Peripheral Vascular Intervention;  Surgeon: Elam Dutch, MD;  Location: [place] [facility];  Service: Cardiovascular;;  . POLYPECTOMY  04/08/2019   Procedure: POLYPECTOMY;  Surgeon: Milus Banister, MD;  Location: [facility];  Service: Endoscopy;;    Family History  Problem Relation Age of Onset  . Stroke Father 40  . Cancer Brother   . Colon cancer Neg Hx   . Breast cancer Neg Hx   . Colon polyps Neg Hx   . Esophageal cancer Neg Hx   . Rectal cancer Neg Hx   . Stomach cancer Neg Hx     SOCIAL HISTORY: Social History   Socioeconomic History  . Marital status: Widowed    Spouse name: Not on file  . Number of children: 1  . Years of education: Not on file  . Highest education level: Not on file  Occupational History  . 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     Comment: Glass blower/designer  Tobacco Use  . Smoking status: Former Smoker    Types: Cigarettes    Quit date: 04/21/2019    Years since quitting: 0.3  . Smokeless tobacco: Never Used  . Tobacco comment: Pt has stopped using patches.  Vaping Use  . Vaping Use: Never used  Substance and Sexual Activity  . Alcohol use: No    Alcohol/week: 0.0 standard drinks  . Drug use: No  . Sexual activity: Not on file  Other Topics Concern  . Not on file  Social History Narrative   From [employer]   Retired 2007   Widowed 2008 after 49 years, lives alone.     Enjoys yard work, but needs some help with yard work now.     Her daughter is helping at home some (some as of 2019)   Social Determinants of Health   Financial Resource Strain:   . Difficulty of Paying Living Expenses:   Food Insecurity:   . Worried About Charity fundraiser in the Last Year:   . Arboriculturist in the Last Year:   Transportation Needs:   . Film/video editor (Medical):   Marland Kitchen Lack of Transportation (Non-Medical):   Physical Activity:   . Days of Exercise per Week:   . Minutes of Exercise per Session:   Stress:   . Feeling of Stress :   Social Connections:   . Frequency of Communication with Friends and Family:   . Frequency of Social Gatherings with Friends and Family:   . Attends Religious Services:   . Active Member of Clubs or Organizations:   . Attends Archivist Meetings:   Marland Kitchen Marital Status:   Intimate Partner Violence:   . Fear of Current or Ex-Partner:   . Emotionally Abused:   Marland Kitchen Physically Abused:   . Sexually Abused:     Allergies  Allergen Reactions  . Aspirin Other (See Comments)    REACTION: Uncoated Stomach upset on empty stomach  . Codeine Nausea Only and Other (See Comments)    Pill needs to be E.coated  . Lipitor [Atorvastatin] Other (See Comments)    myalgia    Current Outpatient Medications  Medication Sig Dispense Refill  . aspirin 81 MG EC tablet Take 1 tablet  (81 mg total) by mouth daily.    . cetirizine (ZYRTEC) 10 MG tablet Take 10 mg by mouth as needed for allergies.    Marland Kitchen clopidogrel (PLAVIX) 75 MG tablet Take 1 tablet (75 mg total) by mouth daily.    . enalapril (VASOTEC) 10 MG tablet Take 1.5 tablets (15 mg total) by mouth daily. 45 tablet  0  . ferrous sulfate 325 (65 FE) MG tablet Take 1 tablet (325 mg total) by mouth daily with breakfast. May cause stools to become darker and may cause constipation 30 tablet 3  . fluticasone (FLONASE) 50 MCG/ACT nasal spray Place 2 sprays into both nostrils daily.    . hydrochlorothiazide (MICROZIDE) 12.5 MG capsule TAKE 1 CAPSULE BY MOUTH IN  THE MORNING 90 capsule 1  . levothyroxine (SYNTHROID) 50 MCG tablet TAKE 1 TABLET BY MOUTH  DAILY EXCEPT TAKE 2 TABLETS ON SUNDAYS AND WEDNESDAYS. (TOTAL 9  TABLETS PER WEEK) (Patient taking differently: Take 50 mcg by mouth See admin instructions. TAKE 1 TABLET BY MOUTH  DAILY EXCEPT TAKE 2 TABLETS ON SUNDAYS AND WEDNESDAYS. (TOTAL 9  TABLETS PER WEEK)) 108 tablet 3  . Liniments (BLUE-EMU SUPER STRENGTH EX) Apply 1 application topically as needed (knee pain).    . metoprolol tartrate (LOPRESSOR) 100 MG tablet TAKE 1/2 TABLET BY MOUTH IN THE MORNING AND 1 TABLET BY MOUTH IN THE EVENING 135 tablet 1  . pravastatin (PRAVACHOL) 10 MG tablet Take 1 tablet (10 mg total) by mouth daily. 90 tablet 2   No current facility-administered medications for this visit.    ROS:   General:  No weight loss, Fever, chills  HEENT: No recent headaches, no nasal bleeding, no visual changes, no sore throat  Neurologic: No dizziness, blackouts, seizures. No recent symptoms of stroke or mini- stroke. No recent episodes of slurred speech, or temporary blindness.  Cardiac: No recent episodes of chest pain/pressure, no shortness of breath at rest.  No shortness of breath with exertion.  Denies history of atrial fibrillation or irregular heartbeat  Vascular: No history of rest pain in feet.  No  history of claudication.  No history of non-healing ulcer, No history of DVT   Pulmonary: No home oxygen, no productive cough, no hemoptysis,  No asthma or wheezing  Musculoskeletal:  [X]  Arthritis, [X]  Low back pain,  [X]  Joint pain  Hematologic:No history of hypercoagulable state.  No history of easy bleeding.  No history of anemia  Gastrointestinal: No hematochezia or melena,  No gastroesophageal reflux, no trouble swallowing  Urinary: [ ]  chronic Kidney disease, [ ]  on HD - [ ]  MWF or [ ]  TTHS, [ ]  Burning with urination, [ ]  Frequent urination, [ ]  Difficulty urinating;   Skin: No rashes  Psychological: No history of anxiety,  No history of depression   Physical Examination  Vitals:   09/05/19 1019 09/05/19 1024  BP: (!) 191/84 (!) 168/90  Pulse: 60   Resp: 20   Temp: 97.9 F (36.6 C)   SpO2: 92%   Weight: 108 lb 4.8 oz (49.1 kg)   Height: 5\' 4"  (1.626 m)     Body mass index is 18.59 kg/m.  General:  Alert and oriented, no acute distress HEENT: Normal Neck: No JVD Cardiac: Regular Rate and Rhythm Abdomen: Soft, non-tender, non-distended, no mass Skin: No rash, feet are dusky bilaterally Extremity Pulses:  2+ radial, brachial, femoral, absent dorsalis pedis, posterior tibial pulses bilaterally Musculoskeletal: No deformity or edema  Neurologic: Upper and lower extremity motor 5/5 and symmetric  DATA:  Patient had bilateral ABIs performed today which were 0.65 on the right 0.8 on the left.  This is unchanged from her previous ABIs in March 2021 as well as September 2019 after her stenting procedure.  ASSESSMENT: Right leg pain.  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  She does not have clinical evidence of restenosis of her aortic or left common iliac stent.  Her symptoms are not completely consistent with peripheral arterial disease and do not really  sound like claudication.  It sounds more musculoskeletal with either the back or degenerative changes in her knee involved.  She currently does have adequate perfusion to her right foot.  She was also noting the duskiness in both feet.  Ice would suggest that this is more of a central problem either related to cardiac dysfunction or chronic hypoxia from COPD.  She did not have ABIs that were consistent with severe ischemia today.  I also discussed with her that if we were going to consider an intervention for her right leg it would most likely require an operation and not stenting since she has a flush occlusion of the SFA.  If we were going to consider any intervention on the right leg she would need to have consideration for her carotid considered as well.  However, since her ABIs are really unchanged over the last 2 years I am suspicious that her symptoms are related to something else other than peripheral arterial disease.   PLAN: Patient will discuss her right leg knee and hip pain issues with her primary care physician at her appointment next week.  She will try to walk for 30 minutes daily.  She will continue to refrain from smoking.  She will see me in follow-up in 3 months time to see if her symptoms have improved at all.   Ruta Hinds, MD [facility] Office: [phone] Pager: [phone]

## 2019-09-12 ENCOUNTER — Ambulatory Visit (INDEPENDENT_AMBULATORY_CARE_PROVIDER_SITE_OTHER): Payer: Medicare Other | Admitting: Family Medicine

## 2019-09-12 ENCOUNTER — Ambulatory Visit (INDEPENDENT_AMBULATORY_CARE_PROVIDER_SITE_OTHER)
Admission: RE | Admit: 2019-09-12 | Discharge: 2019-09-12 | Disposition: A | Discharge status: Home / Self Care | Payer: Medicare Other | Source: Ambulatory Visit | Attending: Family Medicine | Admitting: Family Medicine

## 2019-09-12 ENCOUNTER — Other Ambulatory Visit: Payer: Self-pay

## 2019-09-12 ENCOUNTER — Encounter: Payer: Self-pay | Admitting: Family Medicine

## 2019-09-12 VITALS — BP 122/70 | HR 54 | Temp 97.0°F | Ht 64.0 in | Wt 108.0 lb

## 2019-09-12 DIAGNOSIS — M79604 Pain in right leg: Secondary | ICD-10-CM

## 2019-09-12 DIAGNOSIS — Z7189 Other specified counseling: Secondary | ICD-10-CM

## 2019-09-12 DIAGNOSIS — M545 Low back pain, unspecified: Secondary | ICD-10-CM

## 2019-09-12 DIAGNOSIS — E039 Hypothyroidism, unspecified: Secondary | ICD-10-CM

## 2019-09-12 DIAGNOSIS — E785 Hyperlipidemia, unspecified: Secondary | ICD-10-CM

## 2019-09-12 DIAGNOSIS — I10 Essential (primary) hypertension: Secondary | ICD-10-CM

## 2019-09-12 DIAGNOSIS — D649 Anemia, unspecified: Secondary | ICD-10-CM | POA: Diagnosis not present

## 2019-09-12 DIAGNOSIS — Z0000 Encounter for general adult medical examination without abnormal findings: Secondary | ICD-10-CM

## 2019-09-12 DIAGNOSIS — M47816 Spondylosis without myelopathy or radiculopathy, lumbar region: Secondary | ICD-10-CM | POA: Diagnosis not present

## 2019-09-12 DIAGNOSIS — M1611 Unilateral primary osteoarthritis, right hip: Secondary | ICD-10-CM | POA: Diagnosis not present

## 2019-09-12 LAB — LIPID PANEL
Cholesterol: 200 mg/dL (ref 0–200)
HDL: 76.6 mg/dL (ref >39.00)
LDL Cholesterol: 99 mg/dL (ref 0–99)
NonHDL: 122.98
Total CHOL/HDL Ratio: 3
Triglycerides: 119 mg/dL (ref 0.0–149.0)
VLDL: 23.8 mg/dL (ref 0.0–40.0)

## 2019-09-12 LAB — COMPREHENSIVE METABOLIC PANEL
ALT: 17 U/L (ref 0–35)
AST: 19 U/L (ref 0–37)
Albumin: 4.2 g/dL (ref 3.5–5.2)
Alkaline Phosphatase: 81 U/L (ref 39–117)
BUN: 16 mg/dL (ref 6–23)
CO2: 32 mEq/L (ref 19–32)
Calcium: 9.5 mg/dL (ref 8.4–10.5)
Chloride: 96 mEq/L (ref 96–112)
Creatinine, Ser: 0.75 mg/dL (ref 0.40–1.20)
GFR: 73.74 mL/min (ref >60.00)
Glucose, Bld: 94 mg/dL (ref 70–99)
Potassium: 4 mEq/L (ref 3.5–5.1)
Sodium: 133 mEq/L — ABNORMAL LOW (ref 135–145)
Total Bilirubin: 0.6 mg/dL (ref 0.2–1.2)
Total Protein: 6.9 g/dL (ref 6.0–8.3)

## 2019-09-12 LAB — FERRITIN: Ferritin: 68.4 ng/mL (ref 10.0–291.0)

## 2019-09-12 LAB — CBC WITH DIFFERENTIAL/PLATELET
Basophils Absolute: 0.1 10*3/uL (ref 0.0–0.1)
Basophils Relative: 1.1 % (ref 0.0–3.0)
Eosinophils Absolute: 0.1 10*3/uL (ref 0.0–0.7)
Eosinophils Relative: 2.4 % (ref 0.0–5.0)
HCT: 49.3 % — ABNORMAL HIGH (ref 36.0–46.0)
Hemoglobin: 16.4 g/dL — ABNORMAL HIGH (ref 12.0–15.0)
Lymphocytes Relative: 20.6 % (ref 12.0–46.0)
Lymphs Abs: 1 10*3/uL (ref 0.7–4.0)
MCHC: 33.4 g/dL (ref 30.0–36.0)
MCV: 91.9 fl (ref 78.0–100.0)
Monocytes Absolute: 0.5 10*3/uL (ref 0.1–1.0)
Monocytes Relative: 11.2 % (ref 3.0–12.0)
Neutro Abs: 3.2 10*3/uL (ref 1.4–7.7)
Neutrophils Relative %: 64.7 % (ref 43.0–77.0)
Platelets: 179 10*3/uL (ref 150.0–400.0)
RBC: 5.36 Mil/uL — ABNORMAL HIGH (ref 3.87–5.11)
RDW: 17 % — ABNORMAL HIGH (ref 11.5–15.5)
WBC: 4.9 10*3/uL (ref 4.0–10.5)

## 2019-09-12 LAB — CK: Total CK: 29 U/L (ref 7–177)

## 2019-09-12 LAB — TSH: TSH: 1.18 u[IU]/mL (ref 0.35–4.50)

## 2019-09-12 LAB — IRON: Iron: 134 ug/dL (ref 42–145)

## 2019-09-12 NOTE — Patient Instructions (Addendum)
Check with your insurance to see if they will cover the shingrix shot.  Go to the lab on the way out.   If you have mychart we'll likely use that to update you.     Don't change your meds except for stopping the pravastatin for 1 week and see if the aches are clearly better.  Either way, let me know.   Take care.  Glad to see you.

## 2019-09-12 NOTE — Progress Notes (Signed)
This visit occurred during the SARS-CoV-2 public health emergency.  Safety protocols were in place, including screening questions prior to the visit, additional usage of staff PPE, and extensive cleaning of exam room while observing appropriate contact time as indicated for disinfecting solutions.  She is still having R thigh pain, esp last night.  She has vascular eval and was not thought to have a vascular cause for pain.  Per Dr. Oneida Alar "However, since her ABIs are really unchanged over the last 2 years I am suspicious that her symptoms are related to something else other than peripheral arterial disease." tylenol helps with the pain.    She isn't smoking, d/w pt.  I thanked her for her effort.  She is using patches. Her breathing is better at rest but she has SOB with exertion.  Rare cough, that is better.    Hypertension:    Using medication without problems or lightheadedness: yes Chest pain with exertion: no Edema:no Short of breath: see above.   Still on metoprolol, HCTZ, and enalapril.   Still on iron at baseline.   Due for follow up labs.    Elevated Cholesterol: Using medications without problems: see below.   Muscle aches: leg pain at baseline.  Diet compliance: yes Exercise: limited by pain Pravastatin 10mg  a day.   R leg pain increased with activity.    Still on levothyroxine at baseline.  No ADE on med.  Compliant. No neck mass.  No dysphagia except for chalky pills like tylenol.    Her daughter is widowed as of 2021 and moved in with patient.  Mammogram declined for now.  She'll consider.  Discussed rationale and she'll update me as needed.   DXA declined.  Living will d/w pt.Daughter Thayer Headings designated if patient were incapacitated.  Colonoscopy deferred given her age.  Not blood seen in stool.   covid 2021 Flu 2020 PNA up to date Tetanus 2012 Shingles d/w pt.    Meds, vitals, and allergies reviewed.   ROS: Per HPI unless specifically indicated in ROS  section   GEN: nad, alert and oriented HEENT: ncat NECK: supple w/o LA CV: rrr PULM: ctab, no inc wob ABD: soft, +bs EXT: no edema SKIN: no acute rash Right hip with pain on internal rotation but able to bear weight.  She has some right thigh soreness anteriorly at baseline but not tender to palpation on exam.

## 2019-09-14 ENCOUNTER — Other Ambulatory Visit: Payer: Self-pay | Admitting: Family Medicine

## 2019-09-14 DIAGNOSIS — I739 Peripheral vascular disease, unspecified: Secondary | ICD-10-CM

## 2019-09-14 DIAGNOSIS — E785 Hyperlipidemia, unspecified: Secondary | ICD-10-CM

## 2019-09-15 ENCOUNTER — Other Ambulatory Visit: Payer: Self-pay | Admitting: Family Medicine

## 2019-09-15 DIAGNOSIS — I739 Peripheral vascular disease, unspecified: Secondary | ICD-10-CM

## 2019-09-15 DIAGNOSIS — D649 Anemia, unspecified: Secondary | ICD-10-CM

## 2019-09-15 DIAGNOSIS — M79604 Pain in right leg: Secondary | ICD-10-CM

## 2019-09-15 MED ORDER — LEVOTHYROXINE SODIUM 50 MCG PO TABS: 50.0000 ug | ORAL_TABLET | ORAL | 3 refills | Status: DC

## 2019-09-15 MED ORDER — CLOPIDOGREL BISULFATE 75 MG PO TABS: 75.0000 mg | ORAL_TABLET | Freq: Every day | ORAL | 3 refills | Status: DC

## 2019-09-15 MED ORDER — HYDROCHLOROTHIAZIDE 12.5 MG PO CAPS: ORAL_CAPSULE | ORAL | 3 refills | Status: DC

## 2019-09-15 MED ORDER — ENALAPRIL MALEATE 10 MG PO TABS: 15.0000 mg | ORAL_TABLET | Freq: Every day | ORAL | 3 refills | Status: DC

## 2019-09-15 MED ORDER — METOPROLOL TARTRATE 100 MG PO TABS: ORAL_TABLET | ORAL | 3 refills | Status: DC

## 2019-09-15 NOTE — Assessment & Plan Note (Signed)
Still on levothyroxine at baseline.  No ADE on med.  Compliant. No neck mass.  No dysphagia except for chalky pills like tylenol.   See notes on labs.  No change in meds at this point.

## 2019-09-15 NOTE — Assessment & Plan Note (Signed)
Unclear source.  Thought not to have vascular cause per vascular clinic opinion I doubt that statin is causing her symptoms will hold statin for 1 week and then update me.  Check labs today.  See notes on imaging.  At this point still okay for outpatient follow-up.  She agrees.

## 2019-09-15 NOTE — Assessment & Plan Note (Addendum)
Continue on metoprolol, HCTZ, and enalapril.  No change in meds at this point.  She agrees.

## 2019-09-15 NOTE — Assessment & Plan Note (Signed)
I doubt that her leg pain is related to statin use but I want her to stop her pravastatin for 1 week and then see how she feels.  See after visit summary.  She will update me as needed.

## 2019-09-15 NOTE — Assessment & Plan Note (Signed)
Mammogram declined for now.  She'll consider.  Discussed rationale and she'll update me as needed.   DXA declined.  Living will d/w pt.Daughter Jill Franco designated if patient were incapacitated.  Colonoscopy deferred given her age.  Not blood seen in stool.   covid 2021 Flu 2020 PNA up to date Tetanus 2012 Shingles d/w pt.

## 2019-09-15 NOTE — Assessment & Plan Note (Signed)
Still on iron at baseline.   Due for follow up labs.   See notes on labs.

## 2019-09-15 NOTE — Assessment & Plan Note (Signed)
Living will d/w pt.Daughter Jill Franco designated if patient were incapacitated.

## 2019-09-23 ENCOUNTER — Ambulatory Visit: Payer: Medicare Other | Admitting: Orthopedic Surgery

## 2019-10-03 ENCOUNTER — Ambulatory Visit: Payer: Medicare Other | Admitting: Orthopedic Surgery

## 2019-10-03 ENCOUNTER — Encounter: Payer: Self-pay | Admitting: Orthopedic Surgery

## 2019-10-03 DIAGNOSIS — M25551 Pain in right hip: Secondary | ICD-10-CM | POA: Diagnosis not present

## 2019-10-13 ENCOUNTER — Telehealth: Payer: Self-pay | Admitting: Family Medicine

## 2019-10-13 DIAGNOSIS — R911 Solitary pulmonary nodule: Secondary | ICD-10-CM

## 2019-10-13 NOTE — Telephone Encounter (Signed)
Call patient.  Due for follow-up CT regarding lung nodule previously seen on imaging.  I put in the order.  She should get a phone call about scheduling.  Thanks.

## 2019-10-14 ENCOUNTER — Encounter: Payer: Self-pay | Admitting: Orthopedic Surgery

## 2019-10-14 NOTE — Progress Notes (Signed)
Office Visit Note   Patient: Jill Franco           Date of Birth: 1934-08-27           MRN: [id number] Visit Date: 10/03/2019              Requested by: Tonia Ghent, MD [address] [place],   [address] PCP: Tonia Ghent, MD  Chief Complaint  Patient presents with   Right Leg - Pain      HPI: Patient is an 85 year old who presents complaining of right groin pain that radiates down to the knee.  Patient has no prior  hip replacement.  She states it has been worse after the past year she denies any injuries denies any numbness patient states she has no mechanical symptoms in her knee.  Patient has had claudication symptoms in the left calf with walking and does have a follow-up with vascular vein surgery.  Assessment & Plan: Visit Diagnoses:  1. Pain in right hip     Plan: We will set patient up for a intra-articular hip injection with Dr. Ernestina Patches.  Her symptoms seem to be mechanical from the right hip.  Follow-Up Instructions: No follow-ups on file.   Ortho Exam  Patient is alert, oriented, no adenopathy, well-dressed, normal affect, normal respiratory effort. Examination patient has a negative straight leg raise she has limited range of motion of the hip but no pain in the range of motion of the hip.  She has no pain in the right knee with range of motion.  She has been on a statin was taken off this 5 days ago she does not have any muscle symptoms.  She does have vascular insufficiency bilateral lower extremities but no ulcers.  The pain is primarily from the groin on the right and radiates to the anterior aspect of the right knee.  She states she does have some pain at night.  Review of the radiographs of the lumbar spine shows severe peripheral vascular disease with calcification of the vessels and stents.  Radiographs of the hip show sclerotic bone-on-bone changes.  Imaging: No results found. No images are attached to the encounter.  Labs: No  results found for: HGBA1C, ESRSEDRATE, CRP, LABURIC, REPTSTATUS, GRAMSTAIN, CULT, LABORGA   Lab Results  Component Value Date   ALBUMIN 4.2 09/12/2019   ALBUMIN 4.2 04/26/2019   ALBUMIN 3.8 04/05/2019    Lab Results  Component Value Date   MG 1.8 04/08/2019   MG 2.1 04/07/2019   Lab Results  Component Value Date   VD25OH 26 (L) 09/23/2008    No results found for: PREALBUMIN CBC EXTENDED Latest Ref Rng & Units 09/12/2019 05/03/2019 04/26/2019  WBC 4.0 - 10.5 K/uL 4.9 7.7 6.3  RBC 3.87 - 5.11 Mil/uL 5.36(H) 3.95 4.16  HGB 12.0 - 15.0 g/dL 16.4(H) 11.0 Repeated and verified X2.(L) 11.6(L)  HCT 36 - 46 % 49.3(H) 33.7(L) 38.0  PLT 150 - 400 K/uL 179.0 267.0 309  NEUTROABS 1.4 - 7.7 K/uL 3.2 - -  LYMPHSABS 0.7 - 4.0 K/uL 1.0 - -     There is no height or weight on file to calculate BMI.  Orders:  Orders Placed This Encounter  Procedures   Ambulatory referral to Physical Medicine Rehab   No orders of the defined types were placed in this encounter.    Procedures: No procedures performed  Clinical Data: No additional findings.  ROS:  All other systems negative, except as  noted in the HPI. Review of Systems  Objective: Vital Signs: There were no vitals taken for this visit.  Specialty Comments:  No specialty comments available.  PMFS History: Patient Active Problem List   Diagnosis Date Noted   Anemia 09/12/2019   Pulmonary nodule 04/17/2019   Hyponatremia 04/17/2019   Vertigo 04/17/2019   Polyp of transverse colon    Lower GI bleed 04/05/2019   Medicare annual wellness visit, subsequent 09/12/2018   CVA (cerebral vascular accident) ([facility]) 11/27/2017   Vision loss 11/21/2017   Double vision 08/16/2016   PAD (peripheral artery disease) ([facility]) 03/18/2016   Peripheral vascular disease ([facility]) 01/11/2016   Right leg pain 01/11/2016   Back pain 01/03/2016   Claudication ([facility]) 01/03/2016   Paresthesia 01/03/2016   Hair loss 10/01/2015   Colon  cancer screening 08/06/2015   Allergic rhinitis 03/20/2015   Advance care planning 08/04/2014   Smoking 08/04/2014   Healthcare maintenance 06/07/2011   Herpes zoster 03/19/2010   Hypothyroidism 08/23/2006   HLD (hyperlipidemia) 08/23/2006   HYPERTENSION, BENIGN ESSENTIAL 07/18/2006   Past Medical History:  Diagnosis Date   Cataract    bilaterally corrected.   CVA (cerebral vascular accident) ([facility])    Pt was unaware and does not know when it happened.  Was told after some testing was done.   Double vision    Facial droop 10/2017   left   History of shingles 2012   HLD (hyperlipidemia)    HTN (hypertension)    Hypothyroidism    PVD (peripheral vascular disease) ([facility])    Vision loss    temporary    Family History  Problem Relation Age of Onset   Stroke Father 54   Cancer Brother    Colon cancer Neg Hx    Breast cancer Neg Hx    Colon polyps Neg Hx    Esophageal cancer Neg Hx    Rectal cancer Neg Hx    Stomach cancer Neg Hx     Past Surgical History:  Procedure Laterality Date   ABDOMINAL EXPLORATION SURGERY  1950s   "they thought I was pregnant; went in to explore"   APPENDECTOMY  childhood   BACK SURGERY     BREAST LUMPECTOMY Left 1960's   benign   carotid ultrasound  06/19/2007   0-39% stenosis   CATARACT EXTRACTION W/ INTRAOCULAR LENS IMPLANT Left 09/2016   CATARACT EXTRACTION W/ INTRAOCULAR LENS IMPLANT Right 10/2016   COLONOSCOPY WITH PROPOFOL N/A 04/08/2019   Procedure: COLONOSCOPY WITH PROPOFOL;  Surgeon: Milus Banister, MD;  Location: [facility];  Service: Endoscopy;  Laterality: N/A;   DILATION AND CURETTAGE OF UTERUS  1960's X 3   miscarriages   DOPPLER ECHOCARDIOGRAPHY  06/19/2007   Normal EF 55-70%   ILIAC ARTERY STENT Left 03/18/2016   common iliac stent (6 x 40), aortic stent (10 x 19)/notes 03/18/2016   LAMINECTOMY AND MICRODISCECTOMY SPINE  1960's X 2   MOLE REMOVAL  07/10/2016   NSVD     x1    PERIPHERAL VASCULAR CATHETERIZATION N/A 03/18/2016   Procedure: Abdominal Aortogram w/Lower Extremity;  Surgeon: Elam Dutch, MD;  Location: [facility];  Service: Cardiovascular;  Laterality: N/A;   PERIPHERAL VASCULAR CATHETERIZATION  03/18/2016   Procedure: Peripheral Vascular Intervention;  Surgeon: Elam Dutch, MD;  Location: [facility];  Service: Cardiovascular;;   POLYPECTOMY  04/08/2019   Procedure: POLYPECTOMY;  Surgeon: Milus Banister, MD;  Location: [facility];  Service: Endoscopy;;  Social History   Occupational History   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    Comment: Glass blower/designer  Tobacco Use   Smoking status: Former Smoker    Types: Cigarettes    Quit date: 04/21/2019    Years since quitting: 0.4   Smokeless tobacco: Never Used  Vaping Use   Vaping Use: Never used  Substance and Sexual Activity   Alcohol use: No    Alcohol/week: 0.0 standard drinks   Drug use: No   Sexual activity: Not on file

## 2019-10-14 NOTE — Telephone Encounter (Signed)
Left detailed message on voicemail.  

## 2019-10-15 ENCOUNTER — Ambulatory Visit: Payer: Medicare Other | Admitting: Physical Medicine and Rehabilitation

## 2019-10-15 ENCOUNTER — Other Ambulatory Visit: Payer: Self-pay

## 2019-10-15 ENCOUNTER — Ambulatory Visit: Payer: Self-pay

## 2019-10-15 ENCOUNTER — Encounter: Payer: Self-pay | Admitting: Physical Medicine and Rehabilitation

## 2019-10-15 DIAGNOSIS — M25551 Pain in right hip: Secondary | ICD-10-CM | POA: Diagnosis not present

## 2019-10-15 MED ORDER — TRIAMCINOLONE ACETONIDE 40 MG/ML IJ SUSP
60.0000 mg | INTRAMUSCULAR | Status: AC | PRN
  Administered 2019-10-15: 60 mg via INTRA_ARTICULAR

## 2019-10-15 MED ORDER — BUPIVACAINE HCL 0.25 % IJ SOLN
4.0000 mL | INTRAMUSCULAR | Status: AC | PRN
  Administered 2019-10-15: 4 mL via INTRA_ARTICULAR

## 2019-10-15 NOTE — Progress Notes (Signed)
   [name] - 85 y.o. female MRN [id number]  Date of birth: 11/22/1934  Office Visit Note: Visit Date: 10/15/2019 PCP: Tonia Ghent, MD Referred by: Tonia Ghent, MD  Subjective: Chief Complaint  Patient presents with  . Right Hip - Pain   HPI:  Jill Franco is a 85 y.o. female who comes in today at the request of Dr. Meridee Score for planned Right anesthetic hip arthrogram with fluoroscopic guidance.  The patient has failed conservative care including home exercise, medications, time and activity modification.  This injection will be diagnostic and hopefully therapeutic.  Please see requesting physician notes for further details and justification.   ROS Otherwise per HPI.  Assessment & Plan: Visit Diagnoses:  1. Pain in right hip     Plan: No additional findings.   Meds & Orders: No orders of the defined types were placed in this encounter.   Orders Placed This Encounter  Procedures  . Large Joint Inj: R hip joint  . XR C-ARM NO REPORT    Follow-up: Return if symptoms worsen or fail to improve.   Procedures: Large Joint Inj: R hip joint on 10/15/2019 1:08 PM Indications: pain and diagnostic evaluation Details: 22 G needle, anterior approach  Arthrogram: Yes  Medications: 4 mL bupivacaine 0.25 %; 60 mg triamcinolone acetonide 40 MG/ML Outcome: tolerated well, no immediate complications  Arthrogram demonstrated excellent flow of contrast throughout the joint surface without extravasation or obvious defect.  The patient had relief of symptoms during the anesthetic phase of the injection.  Procedure, treatment alternatives, risks and benefits explained, specific risks discussed. Consent was given by the patient. Immediately prior to procedure a time out was called to verify the correct patient, procedure, equipment, support staff and site/side marked as required. Patient was prepped and draped in the usual sterile fashion.      No notes on file    Clinical History: No specialty comments available.     Objective:  VS:  HT:    WT:   BMI:     BP:   HR: bpm  TEMP: ( )  RESP:  Physical Exam Constitutional:      General: She is not in acute distress.    Appearance: Normal appearance. She is not ill-appearing.  HENT:     Head: Normocephalic and atraumatic.     Right Ear: External ear normal.     Left Ear: External ear normal.  Eyes:     Extraocular Movements: Extraocular movements intact.  Cardiovascular:     Rate and Rhythm: Normal rate.     Pulses: Normal pulses.  Musculoskeletal:     Right lower leg: No edema.     Left lower leg: No edema.     Comments: Painful hip internal range of motion. Patient has good distal strength with no pain over the greater trochanters.  No clonus or focal weakness.  Skin:    Findings: No erythema, lesion or rash.  Neurological:     General: No focal deficit present.     Mental Status: She is alert and oriented to person, place, and time.     Sensory: No sensory deficit.     Motor: No weakness or abnormal muscle tone.     Coordination: Coordination normal.  Psychiatric:        Mood and Affect: Mood normal.        Behavior: Behavior normal.      Imaging: No results found.

## 2019-10-15 NOTE — Progress Notes (Signed)
Pt state right hip pain that travel down her right knee. Pt state the pain wake her up at night. Pt state pain pill for comfort.   Numeric Pain Rating Scale and Functional Assessment Average Pain 6   In the last MONTH (on 0-10 scale) has pain interfered with the following?  1. General activity like being  able to carry out your everyday physical activities such as walking, climbing stairs, carrying groceries, or moving a chair?  Rating(10)   +Driver, -BT, -Dye Allergies.

## 2019-10-17 ENCOUNTER — Other Ambulatory Visit: Payer: Self-pay

## 2019-10-17 ENCOUNTER — Other Ambulatory Visit (INDEPENDENT_AMBULATORY_CARE_PROVIDER_SITE_OTHER): Payer: Medicare Other

## 2019-10-17 DIAGNOSIS — D649 Anemia, unspecified: Secondary | ICD-10-CM | POA: Diagnosis not present

## 2019-10-17 LAB — CBC WITH DIFFERENTIAL/PLATELET
Basophils Absolute: 0.1 10*3/uL (ref 0.0–0.1)
Basophils Relative: 0.5 % (ref 0.0–3.0)
Eosinophils Absolute: 0 10*3/uL (ref 0.0–0.7)
Eosinophils Relative: 0 % (ref 0.0–5.0)
HCT: 44.7 % (ref 36.0–46.0)
Hemoglobin: 14.8 g/dL (ref 12.0–15.0)
Lymphocytes Relative: 3.8 % — ABNORMAL LOW (ref 12.0–46.0)
Lymphs Abs: 0.6 10*3/uL — ABNORMAL LOW (ref 0.7–4.0)
MCHC: 33.1 g/dL (ref 30.0–36.0)
MCV: 96.6 fl (ref 78.0–100.0)
Monocytes Absolute: 0.8 10*3/uL (ref 0.1–1.0)
Monocytes Relative: 4.8 % (ref 3.0–12.0)
Neutro Abs: 14.8 10*3/uL — ABNORMAL HIGH (ref 1.4–7.7)
Neutrophils Relative %: 90.9 % — ABNORMAL HIGH (ref 43.0–77.0)
Platelets: 216 10*3/uL (ref 150.0–400.0)
RBC: 4.63 Mil/uL (ref 3.87–5.11)
RDW: 14.6 % (ref 11.5–15.5)
WBC: 16.2 10*3/uL — ABNORMAL HIGH (ref 4.0–10.5)

## 2019-10-17 LAB — IRON: Iron: 170 ug/dL — ABNORMAL HIGH (ref 42–145)

## 2019-10-18 ENCOUNTER — Ambulatory Visit (INDEPENDENT_AMBULATORY_CARE_PROVIDER_SITE_OTHER)
Admission: RE | Admit: 2019-10-18 | Discharge: 2019-10-18 | Disposition: A | Discharge status: Home / Self Care | Payer: Medicare Other | Source: Ambulatory Visit | Attending: Family Medicine | Admitting: Family Medicine

## 2019-10-18 DIAGNOSIS — J984 Other disorders of lung: Secondary | ICD-10-CM | POA: Diagnosis not present

## 2019-10-18 DIAGNOSIS — I700 Atherosclerosis of aorta: Secondary | ICD-10-CM | POA: Diagnosis not present

## 2019-10-18 DIAGNOSIS — R911 Solitary pulmonary nodule: Secondary | ICD-10-CM | POA: Diagnosis not present

## 2019-10-18 DIAGNOSIS — I2510 Atherosclerotic heart disease of native coronary artery without angina pectoris: Secondary | ICD-10-CM | POA: Diagnosis not present

## 2019-10-18 DIAGNOSIS — J439 Emphysema, unspecified: Secondary | ICD-10-CM | POA: Diagnosis not present

## 2019-10-20 ENCOUNTER — Other Ambulatory Visit: Payer: Self-pay | Admitting: Family Medicine

## 2019-10-20 DIAGNOSIS — R7989 Other specified abnormal findings of blood chemistry: Secondary | ICD-10-CM

## 2019-10-31 ENCOUNTER — Other Ambulatory Visit (INDEPENDENT_AMBULATORY_CARE_PROVIDER_SITE_OTHER): Payer: Medicare Other

## 2019-10-31 ENCOUNTER — Other Ambulatory Visit: Payer: Self-pay

## 2019-10-31 DIAGNOSIS — R7989 Other specified abnormal findings of blood chemistry: Secondary | ICD-10-CM

## 2019-10-31 LAB — CBC WITH DIFFERENTIAL/PLATELET
Basophils Absolute: 0.1 10*3/uL (ref 0.0–0.1)
Basophils Relative: 0.8 % (ref 0.0–3.0)
Eosinophils Absolute: 0.2 10*3/uL (ref 0.0–0.7)
Eosinophils Relative: 3 % (ref 0.0–5.0)
HCT: 43.6 % (ref 36.0–46.0)
Hemoglobin: 14.7 g/dL (ref 12.0–15.0)
Lymphocytes Relative: 10.1 % — ABNORMAL LOW (ref 12.0–46.0)
Lymphs Abs: 0.7 10*3/uL (ref 0.7–4.0)
MCHC: 33.7 g/dL (ref 30.0–36.0)
MCV: 97.8 fl (ref 78.0–100.0)
Monocytes Absolute: 0.6 10*3/uL (ref 0.1–1.0)
Monocytes Relative: 8.9 % (ref 3.0–12.0)
Neutro Abs: 5.1 10*3/uL (ref 1.4–7.7)
Neutrophils Relative %: 77.2 % — ABNORMAL HIGH (ref 43.0–77.0)
Platelets: 186 10*3/uL (ref 150.0–400.0)
RBC: 4.46 Mil/uL (ref 3.87–5.11)
RDW: 14.1 % (ref 11.5–15.5)
WBC: 6.6 10*3/uL (ref 4.0–10.5)

## 2019-10-31 LAB — IRON: Iron: 107 ug/dL (ref 42–145)

## 2019-11-02 ENCOUNTER — Other Ambulatory Visit: Payer: Self-pay

## 2019-11-02 ENCOUNTER — Ambulatory Visit (INDEPENDENT_AMBULATORY_CARE_PROVIDER_SITE_OTHER): Payer: Medicare Other

## 2019-11-02 DIAGNOSIS — Z23 Encounter for immunization: Secondary | ICD-10-CM | POA: Diagnosis not present

## 2019-11-06 ENCOUNTER — Other Ambulatory Visit: Payer: Self-pay | Admitting: Family Medicine

## 2019-11-06 DIAGNOSIS — R7989 Other specified abnormal findings of blood chemistry: Secondary | ICD-10-CM

## 2019-12-05 ENCOUNTER — Ambulatory Visit: Payer: Medicare Other | Admitting: Vascular Surgery

## 2019-12-19 ENCOUNTER — Other Ambulatory Visit: Payer: Self-pay

## 2019-12-19 ENCOUNTER — Ambulatory Visit (INDEPENDENT_AMBULATORY_CARE_PROVIDER_SITE_OTHER): Payer: Medicare Other | Admitting: Vascular Surgery

## 2019-12-19 ENCOUNTER — Encounter: Payer: Self-pay | Admitting: Vascular Surgery

## 2019-12-19 VITALS — BP 178/90 | Temp 97.8°F | Resp 20 | Ht 64.0 in | Wt 112.0 lb

## 2019-12-19 DIAGNOSIS — I739 Peripheral vascular disease, unspecified: Secondary | ICD-10-CM | POA: Diagnosis not present

## 2019-12-19 NOTE — Progress Notes (Signed)
Patient is an 85 year old female who returns for follow-up today.  She was last seen July 2021.  At that time she complained of pain in the right leg extending from the hip all the way to the foot.  She has had prior aortic and left common iliac stent.  She still has bilateral lower extremity claudication symptoms.  This is consistent with her recent ABIs which were 0.7 on the right 0.8 on the left.  She has cut down on her smoking considerably but is still smoking 1 to 2 cigarettes/day.  She states that the nicotine patch causes itching.  We discussed whether or not she might switch to a nicotine gum.  She did see Dr. Sharol Given recently and had a injection of her right hip which markedly improved her right hip symptoms.  Review of systems: She gets shortness of breath with exertion.  She has no chest pain.  Current Outpatient Medications on File Prior to Visit  Medication Sig Dispense Refill  . cetirizine (ZYRTEC) 10 MG tablet Take 10 mg by mouth as needed for allergies.    Marland Kitchen clopidogrel (PLAVIX) 75 MG tablet TAKE 1 TABLET BY MOUTH  DAILY 90 tablet 3  . clopidogrel (PLAVIX) 75 MG tablet Take 1 tablet (75 mg total) by mouth daily. 90 tablet 3  . enalapril (VASOTEC) 10 MG tablet TAKE 1 AND 1/2 TABLETS BY  MOUTH DAILY 135 tablet 3  . enalapril (VASOTEC) 10 MG tablet Take 1.5 tablets (15 mg total) by mouth daily. 135 tablet 3  . fluticasone (FLONASE) 50 MCG/ACT nasal spray Place 2 sprays into both nostrils daily.    . hydrochlorothiazide (MICROZIDE) 12.5 MG capsule TAKE 1 CAPSULE BY MOUTH IN  THE MORNING 90 capsule 3  . levothyroxine (SYNTHROID) 50 MCG tablet TAKE 1 TABLET BY MOUTH   DAILY EXCEPT TAKE 2 TABLETS ON SUNDAYS AND WEDNESDAYS.  (TOTAL 9  TABLETS PER WEEK) 116 tablet 3  . Liniments (BLUE-EMU SUPER STRENGTH EX) Apply 1 application topically as needed (knee pain).    . metoprolol tartrate (LOPRESSOR) 100 MG tablet TAKE 1/2 TABLET BY MOUTH IN THE MORNING AND 1 TABLET BY MOUTH IN THE EVENING 135 tablet  3  . pravastatin (PRAVACHOL) 10 MG tablet TAKE 1 TABLET BY MOUTH  DAILY 90 tablet 3   No current facility-administered medications on file prior to visit.    Past Medical History:  Diagnosis Date  . Cataract    bilaterally corrected.  . CVA (cerebral vascular accident) ([facility])    Pt was unaware and does not know when it happened.  Was told after some testing was done.  . Double vision   . Facial droop 10/2017   left  . History of shingles 2012  . HLD (hyperlipidemia)   . HTN (hypertension)   . Hypothyroidism   . PVD (peripheral vascular disease) ([facility])   . Vision loss    temporary    Physical exam:  Vitals:   12/19/19 0850  BP: (!) 178/90  Resp: 20  Temp: 97.8 F (36.6 C)  Weight: 112 lb (50.8 kg)  Height: 5\' 4"  (1.626 m)    Extremities: Absent pedal pulses bilaterally, 1-2+ left femoral pulse 2+ right femoral pulse  Skin: No ulcer on the feet  Assessment: Patient with bilateral lower extremity peripheral arterial disease.  Her right hip pain is improved after recent injection.  She still has bilateral lower extremity claudication symptoms.  However, most likely she would need an open operation to improve her claudication symptoms.  She is fairly frail on my sure how well she would tolerate this.  She currently is not in a limb threatening situation.  I discussed with her today the possibility of femoral-popliteal bypass the operative details and will be required as far as hospitalization.  I also discussed with her a continued walking program of 30 minutes daily.  She has opted for a walking program at this point.  Plan: Patient will follow up in 6 months time with repeat ABIs and aortoiliac duplex scan.  If her claudication symptoms become more debilitating over time where she feels like she cannot have a reasonable quality of life with her current walking distance we could consider femoral-popliteal bypass but she would be fairly high risk with her underlying cardiac and  pulmonary dysfunction.  She has adequate perfusion and is not currently at risk of limb loss.  Ruta Hinds, MD [facility] Office: [phone]

## 2020-02-01 ENCOUNTER — Other Ambulatory Visit: Payer: Medicare Other

## 2020-02-01 DIAGNOSIS — Z20822 Contact with and (suspected) exposure to covid-19: Secondary | ICD-10-CM | POA: Diagnosis not present

## 2020-02-03 ENCOUNTER — Telehealth: Payer: Self-pay | Admitting: Family Medicine

## 2020-02-03 LAB — SARS-COV-2, NAA 2 DAY TAT

## 2020-02-03 LAB — NOVEL CORONAVIRUS, NAA: SARS-CoV-2, NAA: NOT DETECTED

## 2020-02-03 NOTE — Telephone Encounter (Signed)
Pt called in wanted to know if she needed to be tested due to during her last visit she was told to come on 12/15 . I looked at her chart and didn't see anything..  Please advise

## 2020-02-03 NOTE — Telephone Encounter (Signed)
Please Advise

## 2020-02-04 ENCOUNTER — Telehealth: Payer: Self-pay | Admitting: General Practice

## 2020-02-04 NOTE — Telephone Encounter (Signed)
Needs lab appointment.  Orders are in. Doesn't need to fast.

## 2020-02-04 NOTE — Telephone Encounter (Signed)
Appointment scheduled for 02/05/2020 at 0755.

## 2020-02-04 NOTE — Telephone Encounter (Signed)
Patients daughter, Thayer Headings, called to get her mothers COVID results. Made her aware they were negative.

## 2020-02-05 ENCOUNTER — Other Ambulatory Visit: Payer: Self-pay

## 2020-02-05 ENCOUNTER — Other Ambulatory Visit (INDEPENDENT_AMBULATORY_CARE_PROVIDER_SITE_OTHER): Payer: Medicare Other

## 2020-02-05 DIAGNOSIS — R7989 Other specified abnormal findings of blood chemistry: Secondary | ICD-10-CM

## 2020-02-05 LAB — CBC WITH DIFFERENTIAL/PLATELET
Basophils Absolute: 0.1 10*3/uL (ref 0.0–0.1)
Basophils Relative: 1 % (ref 0.0–3.0)
Eosinophils Absolute: 0.3 10*3/uL (ref 0.0–0.7)
Eosinophils Relative: 5.4 % — ABNORMAL HIGH (ref 0.0–5.0)
HCT: 48.5 % — ABNORMAL HIGH (ref 36.0–46.0)
Hemoglobin: 16.4 g/dL — ABNORMAL HIGH (ref 12.0–15.0)
Lymphocytes Relative: 14 % (ref 12.0–46.0)
Lymphs Abs: 0.9 10*3/uL (ref 0.7–4.0)
MCHC: 33.9 g/dL (ref 30.0–36.0)
MCV: 97.2 fl (ref 78.0–100.0)
Monocytes Absolute: 0.6 10*3/uL (ref 0.1–1.0)
Monocytes Relative: 10.4 % (ref 3.0–12.0)
Neutro Abs: 4.2 10*3/uL (ref 1.4–7.7)
Neutrophils Relative %: 69.2 % (ref 43.0–77.0)
Platelets: 173 10*3/uL (ref 150.0–400.0)
RBC: 5 Mil/uL (ref 3.87–5.11)
RDW: 12.6 % (ref 11.5–15.5)
WBC: 6.1 10*3/uL (ref 4.0–10.5)

## 2020-02-05 LAB — IRON: Iron: 129 ug/dL (ref 42–145)

## 2020-05-27 DIAGNOSIS — H40033 Anatomical narrow angle, bilateral: Secondary | ICD-10-CM | POA: Diagnosis not present

## 2020-05-27 DIAGNOSIS — H43393 Other vitreous opacities, bilateral: Secondary | ICD-10-CM | POA: Diagnosis not present

## 2020-05-30 ENCOUNTER — Other Ambulatory Visit: Payer: Self-pay

## 2020-05-30 DIAGNOSIS — I779 Disorder of arteries and arterioles, unspecified: Secondary | ICD-10-CM

## 2020-06-05 ENCOUNTER — Emergency Department (HOSPITAL_COMMUNITY): Payer: Medicare Other

## 2020-06-05 ENCOUNTER — Encounter (HOSPITAL_COMMUNITY): Payer: Self-pay

## 2020-06-05 ENCOUNTER — Emergency Department (HOSPITAL_COMMUNITY)
Admission: EM | Admit: 2020-06-05 | Discharge: 2020-06-05 | Disposition: A | Discharge status: Home / Self Care | Payer: Medicare Other | Attending: Emergency Medicine | Admitting: Emergency Medicine

## 2020-06-05 DIAGNOSIS — R197 Diarrhea, unspecified: Secondary | ICD-10-CM | POA: Diagnosis not present

## 2020-06-05 DIAGNOSIS — I10 Essential (primary) hypertension: Secondary | ICD-10-CM | POA: Diagnosis not present

## 2020-06-05 DIAGNOSIS — R7989 Other specified abnormal findings of blood chemistry: Secondary | ICD-10-CM | POA: Diagnosis not present

## 2020-06-05 DIAGNOSIS — Z20822 Contact with and (suspected) exposure to covid-19: Secondary | ICD-10-CM | POA: Diagnosis not present

## 2020-06-05 DIAGNOSIS — E039 Hypothyroidism, unspecified: Secondary | ICD-10-CM | POA: Diagnosis not present

## 2020-06-05 DIAGNOSIS — R1110 Vomiting, unspecified: Secondary | ICD-10-CM | POA: Diagnosis not present

## 2020-06-05 DIAGNOSIS — R062 Wheezing: Secondary | ICD-10-CM | POA: Diagnosis not present

## 2020-06-05 DIAGNOSIS — Z87891 Personal history of nicotine dependence: Secondary | ICD-10-CM | POA: Diagnosis not present

## 2020-06-05 DIAGNOSIS — Z7902 Long term (current) use of antithrombotics/antiplatelets: Secondary | ICD-10-CM | POA: Diagnosis not present

## 2020-06-05 DIAGNOSIS — R531 Weakness: Secondary | ICD-10-CM | POA: Diagnosis not present

## 2020-06-05 DIAGNOSIS — R7401 Elevation of levels of liver transaminase levels: Secondary | ICD-10-CM | POA: Insufficient documentation

## 2020-06-05 DIAGNOSIS — R911 Solitary pulmonary nodule: Secondary | ICD-10-CM | POA: Diagnosis not present

## 2020-06-05 DIAGNOSIS — E871 Hypo-osmolality and hyponatremia: Secondary | ICD-10-CM | POA: Diagnosis not present

## 2020-06-05 DIAGNOSIS — Z79899 Other long term (current) drug therapy: Secondary | ICD-10-CM | POA: Insufficient documentation

## 2020-06-05 DIAGNOSIS — R0902 Hypoxemia: Secondary | ICD-10-CM | POA: Diagnosis not present

## 2020-06-05 DIAGNOSIS — R6889 Other general symptoms and signs: Secondary | ICD-10-CM | POA: Diagnosis not present

## 2020-06-05 DIAGNOSIS — Z743 Need for continuous supervision: Secondary | ICD-10-CM | POA: Diagnosis not present

## 2020-06-05 DIAGNOSIS — R112 Nausea with vomiting, unspecified: Secondary | ICD-10-CM | POA: Diagnosis not present

## 2020-06-05 LAB — COMPREHENSIVE METABOLIC PANEL
ALT: 67 U/L — ABNORMAL HIGH (ref 0–44)
AST: 105 U/L — ABNORMAL HIGH (ref 15–41)
Albumin: 2.8 g/dL — ABNORMAL LOW (ref 3.5–5.0)
Alkaline Phosphatase: 129 U/L — ABNORMAL HIGH (ref 38–126)
Anion gap: 10 (ref 5–15)
BUN: 20 mg/dL (ref 8–23)
CO2: 23 mmol/L (ref 22–32)
Calcium: 8.1 mg/dL — ABNORMAL LOW (ref 8.9–10.3)
Chloride: 95 mmol/L — ABNORMAL LOW (ref 98–111)
Creatinine, Ser: 0.75 mg/dL (ref 0.44–1.00)
GFR, Estimated: >60 mL/min (ref >60)
Glucose, Bld: 120 mg/dL — ABNORMAL HIGH (ref 70–99)
Potassium: 3.4 mmol/L — ABNORMAL LOW (ref 3.5–5.1)
Sodium: 128 mmol/L — ABNORMAL LOW (ref 135–145)
Total Bilirubin: 1.3 mg/dL — ABNORMAL HIGH (ref 0.3–1.2)
Total Protein: 5.7 g/dL — ABNORMAL LOW (ref 6.5–8.1)

## 2020-06-05 LAB — CBC WITH DIFFERENTIAL/PLATELET
Abs Immature Granulocytes: 0.04 10*3/uL (ref 0.00–0.07)
Basophils Absolute: 0 10*3/uL (ref 0.0–0.1)
Basophils Relative: 0 %
Eosinophils Absolute: 0 10*3/uL (ref 0.0–0.5)
Eosinophils Relative: 0 %
HCT: 44.2 % (ref 36.0–46.0)
Hemoglobin: 15.2 g/dL — ABNORMAL HIGH (ref 12.0–15.0)
Immature Granulocytes: 0 %
Lymphocytes Relative: 4 %
Lymphs Abs: 0.4 10*3/uL — ABNORMAL LOW (ref 0.7–4.0)
MCH: 33.2 pg (ref 26.0–34.0)
MCHC: 34.4 g/dL (ref 30.0–36.0)
MCV: 96.5 fL (ref 80.0–100.0)
Monocytes Absolute: 1.4 10*3/uL — ABNORMAL HIGH (ref 0.1–1.0)
Monocytes Relative: 15 %
Neutro Abs: 7.5 10*3/uL (ref 1.7–7.7)
Neutrophils Relative %: 81 %
Platelets: 123 10*3/uL — ABNORMAL LOW (ref 150–400)
RBC: 4.58 MIL/uL (ref 3.87–5.11)
RDW: 12 % (ref 11.5–15.5)
WBC: 9.4 10*3/uL (ref 4.0–10.5)
nRBC: 0 % (ref 0.0–0.2)

## 2020-06-05 LAB — RESP PANEL BY RT-PCR (FLU A&B, COVID) ARPGX2
Influenza A by PCR: NEGATIVE
Influenza B by PCR: NEGATIVE
SARS Coronavirus 2 by RT PCR: NEGATIVE

## 2020-06-05 LAB — LIPASE, BLOOD: Lipase: 38 U/L (ref 11–51)

## 2020-06-05 MED ORDER — SODIUM CHLORIDE 0.9 % IV BOLUS
500.0000 mL | Freq: Once | INTRAVENOUS | Status: AC
  Administered 2020-06-05: 500 mL via INTRAVENOUS

## 2020-06-05 MED ORDER — IOHEXOL 350 MG/ML SOLN
80.0000 mL | Freq: Once | INTRAVENOUS | Status: AC | PRN
  Administered 2020-06-05: 80 mL via INTRAVENOUS

## 2020-06-05 NOTE — Discharge Instructions (Addendum)
Your liver enzymes were slightly high today. Please follow-up with your family doctor in the next 3 to 5 days for recheck. Avoid Tylenol and alcohol. Get rechecked immediately if you have fevers or new concerning symptoms.  You had a CT scan performed in the ER today that showed nodules on your lung.  These will need to be followed up by your family doctor with repeat imaging in the next year.

## 2020-06-05 NOTE — ED Triage Notes (Signed)
Pt BIB EMS Pt lives at home with family, per EMS pt has had N/V, and diarrhea, and weakness. No vomiting or diarrhea today. Weakness started a week ago. A& O x 4.   18 gauge left forearm 500 NS  148/114 BP 70 hr 94% room air  99.3 F

## 2020-06-05 NOTE — ED Provider Notes (Signed)
[facility] DEPT Provider Note   CSN: [id number] Arrival date & time: 06/05/20  1916     History Chief Complaint  Patient presents with  . Weakness  . Diarrhea  . Emesis    JAQUELYNN WANAMAKER is a 86 y.o. female.  The history is provided by the patient, the EMS personnel and medical records.   MAHIKA VANVOORHIS is a 86 y.o. female who presents to the Emergency Department complaining of generalized weakness.  She presents to the ED by EMS complaining of generalized weakness and feeling unwell for the last week.  She has BLE pain with ambulation.  She has vomiting - once daily, diarrhea - two episodes.  Stools and emesis are nonbloody.  Has a cough - chronic, worse than baseline.  Had a fever today - 99.  No abdominal pain, chest pain.  Has BLE edema - chronic.    Lives with daughter.    Past Medical History:  Diagnosis Date  . Cataract    bilaterally corrected.  . CVA (cerebral vascular accident) ([place])    Pt was unaware and does not know when it happened.  Was told after some testing was done.  . Double vision   . Facial droop 10/2017   left  . History of shingles 2012  . HLD (hyperlipidemia)   . HTN (hypertension)   . Hypothyroidism   . PVD (peripheral vascular disease) ([place])   . Vision loss    temporary    Patient Active Problem List   Diagnosis Date Noted  . Anemia 09/12/2019  . Pulmonary nodule 04/17/2019  . Hyponatremia 04/17/2019  . Vertigo 04/17/2019  . Polyp of transverse colon   . Lower GI bleed 04/05/2019  . Medicare annual wellness visit, subsequent 09/12/2018  . CVA (cerebral vascular accident) ([place]) 11/27/2017  . Vision loss 11/21/2017  . Double vision 08/16/2016  . PAD (peripheral artery disease) ([place]) 03/18/2016  . Peripheral vascular disease ([place]) 01/11/2016  . Right leg pain 01/11/2016  . Back pain 01/03/2016  . Claudication ([place]) 01/03/2016  . Paresthesia 01/03/2016  . Hair loss 10/01/2015  . Colon cancer  screening 08/06/2015  . Allergic rhinitis 03/20/2015  . Advance care planning 08/04/2014  . Smoking 08/04/2014  . Healthcare maintenance 06/07/2011  . Herpes zoster 03/19/2010  . Hypothyroidism 08/23/2006  . HLD (hyperlipidemia) 08/23/2006  . HYPERTENSION, BENIGN ESSENTIAL 07/18/2006    Past Surgical History:  Procedure Laterality Date  . ABDOMINAL EXPLORATION SURGERY  1950s   "they thought I was pregnant; went in to explore"  . APPENDECTOMY  childhood  . BACK SURGERY    . BREAST LUMPECTOMY Left 1960's   benign  . carotid ultrasound  06/19/2007   0-39% stenosis  . CATARACT EXTRACTION W/ INTRAOCULAR LENS IMPLANT Left 09/2016  . CATARACT EXTRACTION W/ INTRAOCULAR LENS IMPLANT Right 10/2016  . COLONOSCOPY WITH PROPOFOL N/A 04/08/2019   Procedure: COLONOSCOPY WITH PROPOFOL;  Surgeon: Milus Banister, MD;  Location: [facility];  Service: Endoscopy;  Laterality: N/A;  . DILATION AND CURETTAGE OF UTERUS  1960's X 3   miscarriages  . DOPPLER ECHOCARDIOGRAPHY  06/19/2007   Normal EF 55-70%  . ILIAC ARTERY STENT Left 03/18/2016   common iliac stent (6 x 40), aortic stent (10 x 19)/notes 03/18/2016  . LAMINECTOMY AND MICRODISCECTOMY SPINE  1960's X 2  . MOLE REMOVAL  07/10/2016  . NSVD     x1  . PERIPHERAL VASCULAR CATHETERIZATION N/A 03/18/2016   Procedure: Abdominal Aortogram w/Lower Extremity;  Surgeon: Elam Dutch, MD;  Location: [facility];  Service: Cardiovascular;  Laterality: N/A;  . PERIPHERAL VASCULAR CATHETERIZATION  03/18/2016   Procedure: Peripheral Vascular Intervention;  Surgeon: Elam Dutch, MD;  Location: [facility];  Service: Cardiovascular;;  . POLYPECTOMY  04/08/2019   Procedure: POLYPECTOMY;  Surgeon: Milus Banister, MD;  Location: [facility];  Service: Endoscopy;;     OB History   No obstetric history on file.     Family History  Problem Relation Age of Onset  . Stroke Father 30  . Cancer Brother   . Colon cancer Neg Hx   .  Breast cancer Neg Hx   . Colon polyps Neg Hx   . Esophageal cancer Neg Hx   . Rectal cancer Neg Hx   . Stomach cancer Neg Hx     Social History   Tobacco Use  . Smoking status: Former Smoker    Types: Cigarettes    Quit date: 04/21/2019    Years since quitting: 1.1  . Smokeless tobacco: Never Used  Vaping Use  . Vaping Use: Never used  Substance Use Topics  . Alcohol use: No    Alcohol/week: 0.0 standard drinks  . Drug use: No    Home Medications Prior to Admission medications   Medication Sig Start Date End Date Taking? Authorizing Provider  acetaminophen (TYLENOL) 325 MG tablet Take 325 mg by mouth at bedtime as needed for mild pain.   Yes [provider]  clopidogrel (PLAVIX) 75 MG tablet Take 1 tablet (75 mg total) by mouth daily. 09/15/19  Yes Tonia Ghent, MD  enalapril (VASOTEC) 10 MG tablet Take 1.5 tablets (15 mg total) by mouth daily. 09/15/19  Yes Tonia Ghent, MD  hydrochlorothiazide (MICROZIDE) 12.5 MG capsule TAKE 1 CAPSULE BY MOUTH IN  THE MORNING Patient taking differently: Take 12.5 mg by mouth daily. 09/15/19  Yes Tonia Ghent, MD  levothyroxine (SYNTHROID) 50 MCG tablet TAKE 1 TABLET BY MOUTH   DAILY EXCEPT TAKE 2 TABLETS ON SUNDAYS AND WEDNESDAYS.  (TOTAL 9  TABLETS PER WEEK) Patient taking differently: Take by mouth See admin instructions. TAKE 1 TABLET BY MOUTH  DAILY EXCEPT TAKE 2 TABLETS ON SUNDAYS AND WEDNESDAYS. (TOTAL 9  TABLETS PER WEEK) 09/16/19  Yes Tonia Ghent, MD  metoprolol tartrate (LOPRESSOR) 100 MG tablet TAKE 1/2 TABLET BY MOUTH IN THE MORNING AND 1 TABLET BY MOUTH IN THE EVENING Patient taking differently: Take 50-100 mg by mouth See admin instructions. TAKE 1/2 TABLET BY MOUTH IN THE MORNING AND 1 TABLET BY MOUTH IN THE EVENING 09/15/19  Yes Tonia Ghent, MD  pravastatin (PRAVACHOL) 10 MG tablet TAKE 1 TABLET BY MOUTH  DAILY Patient taking differently: Take 10 mg by mouth daily. 09/16/19  Yes Tonia Ghent, MD     Allergies    Aspirin, Codeine, and Lipitor [atorvastatin]  Review of Systems   Review of Systems  All other systems reviewed and are negative.   Physical Exam Updated Vital Signs BP (!) 167/81   Pulse 64   Temp 99.3 F (37.4 C) (Oral)   Resp 18   Ht 5\' 4"  (1.626 m)   Wt 50.8 kg   SpO2 94%   BMI 19.22 kg/m   Physical Exam Vitals and nursing note reviewed.  Constitutional:      Appearance: She is well-developed.  HENT:     Head: Normocephalic and atraumatic.  Cardiovascular:     Rate and  Rhythm: Normal rate and regular rhythm.     Heart sounds: No murmur heard.   Pulmonary:     Effort: Pulmonary effort is normal. No respiratory distress.     Comments: End expiratory wheezes bilaterally Abdominal:     Palpations: Abdomen is soft.     Tenderness: There is no abdominal tenderness. There is no guarding or rebound.  Musculoskeletal:        General: No swelling or tenderness.  Skin:    General: Skin is warm and dry.  Neurological:     Mental Status: She is alert and oriented to person, place, and time.     Comments: generalized weakness.   Psychiatric:        Behavior: Behavior normal.     ED Results / Procedures / Treatments   Labs (all labs ordered are listed, but only abnormal results are displayed) Labs Reviewed  COMPREHENSIVE METABOLIC PANEL - Abnormal; Notable for the following components:      Result Value   Sodium 128 (*)    Potassium 3.4 (*)    Chloride 95 (*)    Glucose, Bld 120 (*)    Calcium 8.1 (*)    Total Protein 5.7 (*)    Albumin 2.8 (*)    AST 105 (*)    ALT 67 (*)    Alkaline Phosphatase 129 (*)    Total Bilirubin 1.3 (*)    All other components within normal limits  CBC WITH DIFFERENTIAL/PLATELET - Abnormal; Notable for the following components:   Hemoglobin 15.2 (*)    Platelets 123 (*)    Lymphs Abs 0.4 (*)    Monocytes Absolute 1.4 (*)    All other components within normal limits  RESP PANEL BY RT-PCR (FLU A&B, COVID)  ARPGX2  LIPASE, BLOOD  HEPATITIS PANEL, ACUTE    EKG EKG Interpretation  Date/Time:  Friday June 05 2020 20:06:25 EDT Ventricular Rate:  87 PR Interval:  189 QRS Duration: 83 QT Interval:  390 QTC Calculation: 403 R Axis:   79 Text Interpretation: Sinus rhythm Atrial premature complexes in couplets Probable anteroseptal infarct, old Confirmed by Quintella Reichert [phone]) on 06/05/2020 8:08:33 PM   Radiology CT Angio Chest PE W/Cm &/Or Wo Cm  Result Date: 06/05/2020 CLINICAL DATA:  Nausea and vomiting, diarrhea, weakness EXAM: CT ANGIOGRAPHY CHEST WITH CONTRAST TECHNIQUE: Multidetector CT imaging of the chest was performed using the standard protocol during bolus administration of intravenous contrast. Multiplanar CT image reconstructions and MIPs were obtained to evaluate the vascular anatomy. CONTRAST:  2mL OMNIPAQUE IOHEXOL 350 MG/ML SOLN COMPARISON:  06/05/2020, 10/18/2019 FINDINGS: Cardiovascular: This is a technically adequate evaluation of the pulmonary vasculature. No filling defects or pulmonary emboli. The heart is unremarkable without pericardial effusion. Normal caliber of the thoracic aorta, with extensive atherosclerosis. Mediastinum/Nodes: No enlarged mediastinal, hilar, or axillary lymph nodes. Thyroid gland, trachea, and esophagus demonstrate no significant findings. Lungs/Pleura: Upper lobe predominant emphysema again noted, with biapical pleural and parenchymal scarring unchanged. No acute airspace disease, effusion, or pneumothorax. Left lower lobe pulmonary nodules again identified, measuring approximately 10 x 10 mm reference image 105/6, unchanged since prior exam. Subpleural 3 mm left upper lobe pulmonary nodule image 52/6 is stable. No other pulmonary nodules or masses. Upper Abdomen: Stable low-attenuation left adrenal thickening measuring up to 2 cm, likely hyperplasia or adenoma. No acute upper abdominal findings. Musculoskeletal: No acute or destructive bony lesions.  Reconstructed images demonstrate no additional findings. Review of the MIP images confirms the above findings. IMPRESSION:  1. No evidence of pulmonary embolus. 2. Stable left-sided pulmonary nodules, largest in the left lower lobe measuring 10 x 10 mm. Non-contrast chest CT in 6-12 months is considered optional for low-risk patients, but is recommended for high-risk patients. This recommendation follows the consensus statement: Guidelines for Management of Incidental Pulmonary Nodules Detected on CT Images: From the Fleischner Society 2017; Radiology 2017; 284:228-243. 3. No acute intrathoracic process. 4. Aortic Atherosclerosis (ICD10-I70.0) and Emphysema (ICD10-J43.9). Electronically Signed   By: Randa Ngo M.D.   On: 06/05/2020 22:38   DG Chest Port 1 View  Result Date: 06/05/2020 CLINICAL DATA:  Pt lives at home with family, per EMS pt has had N/V, and diarrhea, and weakness. No vomiting or diarrhea today. Weakness started a week ago. Pt HX: HTN, ex smoker EXAM: PORTABLE CHEST 1 VIEW COMPARISON:  CT chest 10/18/2019 FINDINGS: The heart size and mediastinal contours are within normal limits. Aortic atherosclerosis. Lungs are hyperinflated. There are small opacities at the left lung base which may reflect scarring. No consolidation. No evidence of edema. No pneumothorax or large pleural effusion. There is no acute finding in the visualized skeleton. IMPRESSION: Mild opacities at the left lung base may reflect scarring. Otherwise no acute findings. Electronically Signed   By: Audie Pinto M.D.   On: 06/05/2020 20:24   US Abdomen Limited RUQ (LIVER/GB)  Result Date: 06/05/2020 CLINICAL DATA:  Elevated LFTs EXAM: ULTRASOUND ABDOMEN LIMITED RIGHT UPPER QUADRANT COMPARISON:  03/26/2019 FINDINGS: Gallbladder: No gallstones or wall thickening visualized. No sonographic Murphy sign noted by sonographer. Common bile duct: Diameter: 3 mm Liver: No focal lesion identified. Within normal limits in parenchymal  echogenicity. Portal vein is patent on color Doppler imaging with normal direction of blood flow towards the liver. Other: None. IMPRESSION: No acute abnormality in the right upper quadrant is noted. Electronically Signed   By: Inez Catalina M.D.   On: 06/05/2020 21:34    Procedures Procedures   Medications Ordered in ED Medications  sodium chloride 0.9 % bolus 500 mL (0 mLs Intravenous Stopped 06/05/20 2105)  iohexol (OMNIPAQUE) 350 MG/ML injection 80 mL (80 mLs Intravenous Contrast Given 06/05/20 2214)    ED Course  I have reviewed the triage vital signs and the nursing notes.  Pertinent labs & imaging results that were available during my care of the patient were reviewed by me and considered in my medical decision making (see chart for details).    MDM Rules/Calculators/A&P                         patient here for evaluation of generalized weakness, vomiting and diarrhea. She is non-toxic appearing on evaluation. She does have a frequent cough, which she states is near her baseline. Labs are significant for acute on chronic hyponatremia, minimal elevation in transaminases. She has no abdominal tenderness. She is not taking acetaminophen. After IV fluid hydration of the department she is feeling improved. CT is negative for pneumonia, PE. Ultrasound is negative for cholecystitis or biliary obstruction. Discussed with patient and daughter findings of labs. Feels she is stable for discharge with close outpatient follow-up and return precautions. Discussed incidental finding on CT scan of pulmonary nodules, which will need further imaging in the next 6 to 12 months.  Final Clinical Impression(s) / ED Diagnoses Final diagnoses:  Transaminitis  Vomiting and diarrhea  Hyponatremia    Rx / DC Orders ED Discharge Orders    None  Quintella Reichert, MD 06/05/20 2312

## 2020-06-06 LAB — HEPATITIS PANEL, ACUTE
HCV Ab: NONREACTIVE
Hep A IgM: NONREACTIVE
Hep B C IgM: NONREACTIVE
Hepatitis B Surface Ag: NONREACTIVE

## 2020-06-11 ENCOUNTER — Inpatient Hospital Stay: Payer: Medicare Other | Admitting: Family Medicine

## 2020-06-16 ENCOUNTER — Encounter: Payer: Self-pay | Admitting: Family Medicine

## 2020-06-16 ENCOUNTER — Ambulatory Visit (INDEPENDENT_AMBULATORY_CARE_PROVIDER_SITE_OTHER): Payer: Medicare Other | Admitting: Family Medicine

## 2020-06-16 ENCOUNTER — Other Ambulatory Visit: Payer: Self-pay

## 2020-06-16 VITALS — BP 112/70 | HR 77 | Temp 98.2°F | Ht 64.0 in | Wt 104.5 lb

## 2020-06-16 DIAGNOSIS — E038 Other specified hypothyroidism: Secondary | ICD-10-CM

## 2020-06-16 DIAGNOSIS — R7989 Other specified abnormal findings of blood chemistry: Secondary | ICD-10-CM | POA: Diagnosis not present

## 2020-06-16 DIAGNOSIS — R0600 Dyspnea, unspecified: Secondary | ICD-10-CM

## 2020-06-16 DIAGNOSIS — E441 Mild protein-calorie malnutrition: Secondary | ICD-10-CM | POA: Diagnosis not present

## 2020-06-16 DIAGNOSIS — R634 Abnormal weight loss: Secondary | ICD-10-CM

## 2020-06-16 DIAGNOSIS — R630 Anorexia: Secondary | ICD-10-CM

## 2020-06-16 DIAGNOSIS — E43 Unspecified severe protein-calorie malnutrition: Secondary | ICD-10-CM | POA: Insufficient documentation

## 2020-06-16 LAB — COMPREHENSIVE METABOLIC PANEL
ALT: 68 U/L — ABNORMAL HIGH (ref 0–35)
AST: 49 U/L — ABNORMAL HIGH (ref 0–37)
Albumin: 3.1 g/dL — ABNORMAL LOW (ref 3.5–5.2)
Alkaline Phosphatase: 167 U/L — ABNORMAL HIGH (ref 39–117)
BUN: 15 mg/dL (ref 6–23)
CO2: 32 mEq/L (ref 19–32)
Calcium: 8.7 mg/dL (ref 8.4–10.5)
Chloride: 95 mEq/L — ABNORMAL LOW (ref 96–112)
Creatinine, Ser: 0.72 mg/dL (ref 0.40–1.20)
GFR: 76.97 mL/min (ref >60.00)
Glucose, Bld: 162 mg/dL — ABNORMAL HIGH (ref 70–99)
Potassium: 4.3 mEq/L (ref 3.5–5.1)
Sodium: 132 mEq/L — ABNORMAL LOW (ref 135–145)
Total Bilirubin: 0.7 mg/dL (ref 0.2–1.2)
Total Protein: 6 g/dL (ref 6.0–8.3)

## 2020-06-16 LAB — TSH: TSH: 2.82 u[IU]/mL (ref 0.35–4.50)

## 2020-06-16 LAB — BRAIN NATRIURETIC PEPTIDE: Pro B Natriuretic peptide (BNP): 174 pg/mL — ABNORMAL HIGH (ref 0.0–100.0)

## 2020-06-16 MED ORDER — FUROSEMIDE 20 MG PO TABS
20.0000 mg | ORAL_TABLET | Freq: Every day | ORAL | 3 refills | Status: DC
Start: 2020-06-16 — End: 2020-06-18

## 2020-06-16 NOTE — Assessment & Plan Note (Signed)
Slightly elevated JVD but otherwise appears euvolemic. Recent CT w/o effusion though symptom concerning for heart. Will check BNP. She asked about inhaler and has smoking hx but no known diagnosis of copd/asthma. May consider if BNP normal.

## 2020-06-16 NOTE — Addendum Note (Signed)
Addended by: Lesleigh Noe on: 06/16/2020 05:52 PM   Modules accepted: Orders

## 2020-06-16 NOTE — Assessment & Plan Note (Signed)
Weight loss. Was normal previously but will recheck. Cont levo 50 mcg.

## 2020-06-16 NOTE — Assessment & Plan Note (Signed)
Slightly elevated in ER with normal work-up including [place] and hepatitis panel. Has been holding tylenol. If normalized may be reasonable to use tylenol once daily for pain. If persisting consider hepatology referral.

## 2020-06-16 NOTE — Progress Notes (Addendum)
Subjective:     Jill Franco is a 86 y.o. female presenting for Hospitalization Follow-up     HPI  #Weakness - not eating - in bed most of the time - went to the ER on 06/05/2020 with diarrhea, weakness, vomiting - Diarrhea is improved - vomiting is resolved - has a chronic cough which is stable    Bilateral leg pain and weakness - follows with the vascular surgeon for her veins - had been taking tylenol in the morning and evening - her LFTs were elevated in the hospital so she stopped taking this which was helping with her leg pain  - weakness is persistent and has worsened over the last 2 weeks  She notes difficulty breathing when laying down Notes PND Also some DOE   Review of Systems  Constitutional: Negative for chills and fever.  Respiratory: Positive for cough and shortness of breath.   Cardiovascular: Negative for chest pain.     Social History   Tobacco Use  Smoking Status Former Smoker  . Types: Cigarettes  . Quit date: 04/21/2019  . Years since quitting: 1.1  Smokeless Tobacco Never Used        Objective:    BP Readings from Last 3 Encounters:  06/16/20 112/70  06/05/20 (!) 190/69  12/19/19 (!) 178/90   Wt Readings from Last 3 Encounters:  06/16/20 104 lb 8 oz (47.4 kg)  06/05/20 112 lb (50.8 kg)  12/19/19 112 lb (50.8 kg)    BP 112/70   Pulse 77   Temp 98.2 F (36.8 C) (Temporal)   Ht 5\' 4"  (1.626 m)   Wt 104 lb 8 oz (47.4 kg)   SpO2 98%   BMI 17.94 kg/m    Physical Exam Constitutional:      General: She is not in acute distress.    Appearance: She is well-developed. She is not diaphoretic.     Comments: thin  HENT:     Right Ear: External ear normal.     Left Ear: External ear normal.  Eyes:     Conjunctiva/sclera: Conjunctivae normal.  Neck:     Vascular: JVD (1 cm above the clavical) present.  Cardiovascular:     Rate and Rhythm: Normal rate and regular rhythm.     Heart sounds: Murmur heard.    Pulmonary:      Effort: Pulmonary effort is normal. No respiratory distress.     Breath sounds: Normal breath sounds. No wheezing.  Musculoskeletal:     Cervical back: Neck supple.     Right lower leg: No edema.     Left lower leg: No edema.  Skin:    General: Skin is warm and dry.     Capillary Refill: Capillary refill takes less than 2 seconds.  Neurological:     Mental Status: She is alert. Mental status is at baseline.  Psychiatric:        Mood and Affect: Mood normal.        Behavior: Behavior normal.           Assessment & Plan:   Problem List Items Addressed This Visit      Endocrine   Hypothyroidism    Weight loss. Was normal previously but will recheck. Cont levo 50 mcg.         Other   Elevated LFTs    Slightly elevated in ER with normal work-up including [place] and hepatitis panel. Has been holding tylenol. If normalized may be reasonable to use  tylenol once daily for pain. If persisting consider hepatology referral.       Relevant Orders   Comprehensive metabolic panel   Protein-calorie malnutrition, mild (HCC)    Weight is down 7% from body weight and 8 lbs in 2 weeks. Suspect this is due to lack of appetite which I also suspect is contributing to her weakness. Encouraged eating regularly and boost 3 times per day if not able to eat. Continue regular water hydration. Labs today TSH and CMP to assess.       PND (paroxysmal nocturnal dyspnea) - Primary    Slightly elevated JVD but otherwise appears euvolemic. Recent CT w/o effusion though symptom concerning for heart. Will check BNP. She asked about inhaler and has smoking hx but no known diagnosis of copd/asthma. May consider if BNP normal.       Relevant Orders   Brain natriuretic peptide    Other Visit Diagnoses    Poor appetite          Addendum: LFTs improving. Will continue to monitor and hold tylenol. BNP elevated. Will start lasix 20 mg and f/u with pcp in 7-10 days   Return in about 10 days (around  06/26/2020).  Lesleigh Noe, MD  This visit occurred during the SARS-CoV-2 public health emergency.  Safety protocols were in place, including screening questions prior to the visit, additional usage of staff PPE, and extensive cleaning of exam room while observing appropriate contact time as indicated for disinfecting solutions.

## 2020-06-16 NOTE — Assessment & Plan Note (Signed)
Weight is down 7% from body weight and 8 lbs in 2 weeks. Suspect this is due to lack of appetite which I also suspect is contributing to her weakness. Encouraged eating regularly and boost 3 times per day if not able to eat. Continue regular water hydration. Labs today TSH and CMP to assess.

## 2020-06-16 NOTE — Patient Instructions (Signed)
Drink Boost 3 times per day -- ok to skip a boost if eating a meal Continue to drink 2 bottles of water per day

## 2020-06-18 ENCOUNTER — Other Ambulatory Visit: Payer: Self-pay

## 2020-06-18 ENCOUNTER — Ambulatory Visit: Payer: Medicare Other | Admitting: Physician Assistant

## 2020-06-18 ENCOUNTER — Ambulatory Visit (INDEPENDENT_AMBULATORY_CARE_PROVIDER_SITE_OTHER)
Admission: RE | Admit: 2020-06-18 | Discharge: 2020-06-18 | Disposition: A | Discharge status: Home / Self Care | Payer: Medicare Other | Source: Ambulatory Visit | Attending: Vascular Surgery | Admitting: Vascular Surgery

## 2020-06-18 ENCOUNTER — Ambulatory Visit (HOSPITAL_COMMUNITY)
Admission: RE | Admit: 2020-06-18 | Discharge: 2020-06-18 | Disposition: A | Discharge status: Home / Self Care | Payer: Medicare Other | Source: Ambulatory Visit | Attending: Vascular Surgery | Admitting: Vascular Surgery

## 2020-06-18 ENCOUNTER — Encounter: Payer: Self-pay | Admitting: Physician Assistant

## 2020-06-18 VITALS — BP 116/65 | HR 80 | Temp 98.4°F | Resp 20 | Ht 64.0 in | Wt 103.6 lb

## 2020-06-18 DIAGNOSIS — I779 Disorder of arteries and arterioles, unspecified: Secondary | ICD-10-CM

## 2020-06-18 DIAGNOSIS — R0600 Dyspnea, unspecified: Secondary | ICD-10-CM

## 2020-06-18 MED ORDER — FUROSEMIDE 20 MG PO TABS: 20.0000 mg | ORAL_TABLET | Freq: Every day | ORAL | 3 refills | Status: DC

## 2020-06-18 NOTE — Progress Notes (Signed)
History of Present Illness:  Patient is a 86 y.o. year old female who presents for evaluation of PAD with claudication.  Previous stent Abdominal aortogram with bilateral lower extremity runoff, left common iliac stent (6 x 40), aortic stent (10 x 19).  She was last seen on 12/19/19 by Dr. Darrick Penna.  She was having B LE pain that radiated from her hips to her feet.  She had a right hip injection by Dr. Lajoyce Corners that seemed to help the right LE for a while.  She reports today that her legs from her hips to her feet are in sever pain all the time.  She can barely walk at this point.  She is unable to pin point where the pain is and is not describing classic ischemic symptoms of rest pain needing a dependent position or calf pain with certain distance of ambulation.  She denise non healing wounds.    She states she has stopped smoking at this time.  She is medically managed on a daily Statin, Plavix and antihypertensive medications.     Past Medical History:  Diagnosis Date  . Cataract    bilaterally corrected.  . CVA (cerebral vascular accident) (HCC)    Pt was unaware and does not know when it happened.  Was told after some testing was done.  . Double vision   . Facial droop 10/2017   left  . History of shingles 2012  . HLD (hyperlipidemia)   . HTN (hypertension)   . Hypothyroidism   . PVD (peripheral vascular disease) (HCC)   . Vision loss    temporary    Past Surgical History:  Procedure Laterality Date  . ABDOMINAL EXPLORATION SURGERY  1950s   "they thought I was pregnant; went in to explore"  . APPENDECTOMY  childhood  . BACK SURGERY    . BREAST LUMPECTOMY Left 1960's   benign  . carotid ultrasound  06/19/2007   0-39% stenosis  . CATARACT EXTRACTION W/ INTRAOCULAR LENS IMPLANT Left 09/2016  . CATARACT EXTRACTION W/ INTRAOCULAR LENS IMPLANT Right 10/2016  . COLONOSCOPY WITH PROPOFOL N/A 04/08/2019   Procedure: COLONOSCOPY WITH PROPOFOL;  Surgeon: Rachael Fee, MD;  Location:  [facility];  Service: Endoscopy;  Laterality: N/A;  . DILATION AND CURETTAGE OF UTERUS  1960's X 3   miscarriages  . DOPPLER ECHOCARDIOGRAPHY  06/19/2007   Normal EF 55-70%  . ILIAC ARTERY STENT Left 03/18/2016   common iliac stent (6 x 40), aortic stent (10 x 19)/notes 03/18/2016  . LAMINECTOMY AND MICRODISCECTOMY SPINE  1960's X 2  . MOLE REMOVAL  07/10/2016  . NSVD     x1  . PERIPHERAL VASCULAR CATHETERIZATION N/A 03/18/2016   Procedure: Abdominal Aortogram w/Lower Extremity;  Surgeon: Sherren Kerns, MD;  Location: [facility] INVASIVE CV LAB;  Service: Cardiovascular;  Laterality: N/A;  . PERIPHERAL VASCULAR CATHETERIZATION  03/18/2016   Procedure: Peripheral Vascular Intervention;  Surgeon: Sherren Kerns, MD;  Location: [facility] INVASIVE CV LAB;  Service: Cardiovascular;;  . POLYPECTOMY  04/08/2019   Procedure: POLYPECTOMY;  Surgeon: Rachael Fee, MD;  Location: [facility];  Service: Endoscopy;;    ROS:   General:  No weight loss, Fever, chills  HEENT: No recent headaches, no nasal bleeding, no visual changes, no sore throat  Neurologic: No dizziness, blackouts, seizures. No recent symptoms of stroke or mini- stroke. No recent episodes of slurred speech, or temporary blindness.  Cardiac: No recent episodes of chest pain/pressure, no shortness of breath  at rest.  No shortness of breath with exertion.  Denies history of atrial fibrillation or irregular heartbeat  Vascular: No history of rest pain in feet.  No history of claudication.  No history of non-healing ulcer, No history of DVT   Pulmonary: No home oxygen, no productive cough, no hemoptysis,  No asthma or wheezing  Musculoskeletal:  [ ]  Arthritis, [ ]  Low back pain,  [ ]  Joint pain  Hematologic:No history of hypercoagulable state.  No history of easy bleeding.  No history of anemia  Gastrointestinal: No hematochezia or melena,  No gastroesophageal reflux, no trouble swallowing  Urinary: [ ]  chronic Kidney disease, [ ]  on HD  - [ ]  MWF or [ ]  TTHS, [ ]  Burning with urination, [ ]  Frequent urination, [ ]  Difficulty urinating;   Skin: No rashes  Psychological: No history of anxiety,  No history of depression  Social History Social History   Tobacco Use  . Smoking status: Former Smoker    Types: Cigarettes    Quit date: 04/21/2019    Years since quitting: 1.1  . Smokeless tobacco: Never Used  Vaping Use  . Vaping Use: Never used  Substance Use Topics  . Alcohol use: No    Alcohol/week: 0.0 standard drinks  . Drug use: No    Family History Family History  Problem Relation Age of Onset  . Stroke Father 59  . Cancer Brother   . Colon cancer Neg Hx   . Breast cancer Neg Hx   . Colon polyps Neg Hx   . Esophageal cancer Neg Hx   . Rectal cancer Neg Hx   . Stomach cancer Neg Hx     Allergies  Allergies  Allergen Reactions  . Aspirin Other (See Comments)    REACTION: Uncoated Stomach upset on empty stomach  . Codeine Nausea Only and Other (See Comments)    Pill needs to be E.coated  . Lipitor [Atorvastatin] Other (See Comments)    myalgia     Current Outpatient Medications  Medication Sig Dispense Refill  . acetaminophen (TYLENOL) 325 MG tablet Take 325 mg by mouth at bedtime as needed for mild pain.    Marland Kitchen clopidogrel (PLAVIX) 75 MG tablet Take 1 tablet (75 mg total) by mouth daily. 90 tablet 3  . enalapril (VASOTEC) 10 MG tablet Take 1.5 tablets (15 mg total) by mouth daily. 135 tablet 3  . furosemide (LASIX) 20 MG tablet Take 1 tablet (20 mg total) by mouth daily. 30 tablet 3  . hydrochlorothiazide (MICROZIDE) 12.5 MG capsule TAKE 1 CAPSULE BY MOUTH IN  THE MORNING (Patient taking differently: Take 12.5 mg by mouth daily.) 90 capsule 3  . levothyroxine (SYNTHROID) 50 MCG tablet TAKE 1 TABLET BY MOUTH   DAILY EXCEPT TAKE 2 TABLETS ON SUNDAYS AND WEDNESDAYS.  (TOTAL 9  TABLETS PER WEEK) (Patient taking differently: Take by mouth See admin instructions. TAKE 1 TABLET BY MOUTH  DAILY EXCEPT TAKE 2  TABLETS ON SUNDAYS AND WEDNESDAYS. (TOTAL 9  TABLETS PER WEEK)) 116 tablet 3  . metoprolol tartrate (LOPRESSOR) 100 MG tablet TAKE 1/2 TABLET BY MOUTH IN THE MORNING AND 1 TABLET BY MOUTH IN THE EVENING (Patient taking differently: Take 50-100 mg by mouth See admin instructions. TAKE 1/2 TABLET BY MOUTH IN THE MORNING AND 1 TABLET BY MOUTH IN THE EVENING) 135 tablet 3  . pravastatin (PRAVACHOL) 10 MG tablet TAKE 1 TABLET BY MOUTH  DAILY (Patient taking differently: Take 10 mg by mouth daily.) 90  tablet 3   No current facility-administered medications for this visit.    Physical Examination  There were no vitals filed for this visit.  There is no height or weight on file to calculate BMI.  General:  Alert and oriented, no acute distress HEENT: Normal Neck: No bruit or JVD Pulmonary: Clear to auscultation bilaterally Cardiac: Regular Rate and Rhythm without murmur Abdomen: Soft, non-tender, non-distended, no mass, no scars Skin: No rash Extremity Pulses:  2+ radial, brachial, right femoral palpable unable to palpate the left femoral pulse, dorsalis pedis, posterior tibial  bilaterally Musculoskeletal: No deformity or edema  Neurologic: Upper and lower extremity motor 5/5 and symmetric.  She denise pain with movement in a seated position of her lower extremities.  DATA:    ABI Findings:  +---------+------------------+-----+----------+--------+  Right  Rt Pressure (mmHg)IndexWaveform Comment   +---------+------------------+-----+----------+--------+  Brachial 166                      +---------+------------------+-----+----------+--------+  PTA   97        0.58 monophasic      +---------+------------------+-----+----------+--------+  DP    92        0.55 monophasic      +---------+------------------+-----+----------+--------+  Great Toe            Absent         +---------+------------------+-----+----------+--------+   +--------+------------------+-----+----------+-------+  Left  Lt Pressure (mmHg)IndexWaveform Comment  +--------+------------------+-----+----------+-------+  [id number]                      +--------+------------------+-----+----------+-------+  PTA   95        0.57 monophasic      +--------+------------------+-----+----------+-------+  DP   113        0.68 monophasic      +--------+------------------+-----+----------+-------+   +-------+-----------+-----------+------------+------------+  ABI/TBIToday's ABIToday's TBIPrevious ABIPrevious TBI  +-------+-----------+-----------+------------+------------+  Right 0.58    absent   0.65    0.27      +-------+-----------+-----------+------------+------------+  Left  0.68    absent   0.80    0.28      +-------+-----------+-----------+------------+------------+    Right ABIs appear essentially unchanged. Left ABIs appear decreased  compared to prior study on 09/05/2019.    Summary:  Right: Resting right ankle-brachial index indicates moderate right lower  extremity arterial disease.   Left: Resting left ankle-brachial index indicates moderate left lower  extremity arterial disease.     Abdominal Aorta Findings:  +-------------+-------+----------+----------+--------+--------+--------+  Location   AP (cm)Trans (cm)PSV (cm/s)WaveformThrombusComments  +-------------+-------+----------+----------+--------+--------+--------+  Proximal            36                  +-------------+-------+----------+----------+--------+--------+--------+  Mid              56                  +-------------+-------+----------+----------+--------+--------+--------+  Distal             63             stent                    83                  +-------------+-------+----------+----------+--------+--------+--------+  RT CIA Mid           94                  +-------------+-------+----------+----------+--------+--------+--------+  RT CIA Distal         209                  +-------------+-------+----------+----------+--------+--------+--------+  RT EIA Prox          123                  +-------------+-------+----------+----------+--------+--------+--------+  RT EIA Mid           114                  +-------------+-------+----------+----------+--------+--------+--------+  RT EIA Distal         103                  +-------------+-------+----------+----------+--------+--------+--------+  LT CIA Prox          249            stent    +-------------+-------+----------+----------+--------+--------+--------+  LT CIA Mid           200            stent    +-------------+-------+----------+----------+--------+--------+--------+  LT CIA Distal         211                  +-------------+-------+----------+----------+--------+--------+--------+  LT EIA Prox          388                  +-------------+-------+----------+----------+--------+--------+--------+  LT EIA Mid           75                  +-------------+-------+----------+----------+--------+--------+--------+       Summary:  Stenosis: +--------------------+-------------+---------------+  Location      Stenosis   Stent       +--------------------+-------------+---------------+  Right Common Iliac >50% stenosis           +--------------------+-------------+---------------+  Left Common Iliac         50-99% stenosis  +--------------------+-------------+---------------+  Right External Iliac<50% stenosis          +--------------------+-------------+---------------+  Left External Iliac >50% stenosis          +--------------------+-------------+---------------+   Areas of decreased visualization due to bowel gas and calcified plaque  with acoustic shadowing.    ASSESSMENT:  PAD s/p Previous stent Abdominal aortogram with bilateral lower extremity runoff, left common iliac stent (6 x 40), aortic stent (10 x 19).  She does not present like a classic patient with B LE.  She has no clear cut claudication symptoms or classic rest pain.    She does have stenosis in B common iliacs and external iliacs.   On ABI's she has absent right TBI with 0.58 and on the left 0.68 absent TBI  She has no evidence of ischemic changes.   PLAN: I discussed her case with Dr. Oneida Alar and we will order a CTA abdomin and pelvis with B LE runoff.  She will then follow up with Dr. Oneida Alar to discuss any intervention that may help her pain.  She states she is unable to tolerate tylenol for pain control and she has sever N/V with anti inflammatory products.    Roxy Horseman PA-C [facility] Office: [phone]  MD in clinic [place]

## 2020-06-19 ENCOUNTER — Inpatient Hospital Stay: Payer: Medicare Other | Admitting: Family Medicine

## 2020-06-22 ENCOUNTER — Other Ambulatory Visit: Payer: Self-pay

## 2020-06-22 DIAGNOSIS — I739 Peripheral vascular disease, unspecified: Secondary | ICD-10-CM

## 2020-06-29 ENCOUNTER — Ambulatory Visit
Admission: RE | Admit: 2020-06-29 | Discharge: 2020-06-29 | Disposition: A | Discharge status: Home / Self Care | Payer: Medicare Other | Source: Ambulatory Visit | Attending: Vascular Surgery | Admitting: Vascular Surgery

## 2020-06-29 DIAGNOSIS — I708 Atherosclerosis of other arteries: Secondary | ICD-10-CM | POA: Diagnosis not present

## 2020-06-29 DIAGNOSIS — I745 Embolism and thrombosis of iliac artery: Secondary | ICD-10-CM | POA: Diagnosis not present

## 2020-06-29 DIAGNOSIS — M47816 Spondylosis without myelopathy or radiculopathy, lumbar region: Secondary | ICD-10-CM | POA: Diagnosis not present

## 2020-06-29 DIAGNOSIS — I739 Peripheral vascular disease, unspecified: Secondary | ICD-10-CM

## 2020-06-29 DIAGNOSIS — I701 Atherosclerosis of renal artery: Secondary | ICD-10-CM | POA: Diagnosis not present

## 2020-06-29 MED ORDER — IOPAMIDOL (ISOVUE-370) INJECTION 76%
100.0000 mL | Freq: Once | INTRAVENOUS | Status: AC | PRN
  Administered 2020-06-29: 100 mL via INTRAVENOUS

## 2020-07-02 ENCOUNTER — Encounter: Payer: Self-pay | Admitting: Family Medicine

## 2020-07-02 ENCOUNTER — Encounter: Payer: Self-pay | Admitting: Vascular Surgery

## 2020-07-02 ENCOUNTER — Other Ambulatory Visit: Payer: Self-pay

## 2020-07-02 ENCOUNTER — Ambulatory Visit (INDEPENDENT_AMBULATORY_CARE_PROVIDER_SITE_OTHER): Payer: Medicare Other | Admitting: Family Medicine

## 2020-07-02 ENCOUNTER — Ambulatory Visit: Payer: Medicare Other | Admitting: Vascular Surgery

## 2020-07-02 VITALS — BP 116/76 | HR 63 | Temp 98.3°F | Resp 16 | Ht 64.0 in | Wt 103.0 lb

## 2020-07-02 VITALS — BP 124/70 | HR 52 | Temp 97.4°F | Wt 103.9 lb

## 2020-07-02 DIAGNOSIS — I739 Peripheral vascular disease, unspecified: Secondary | ICD-10-CM

## 2020-07-02 DIAGNOSIS — E785 Hyperlipidemia, unspecified: Secondary | ICD-10-CM | POA: Diagnosis not present

## 2020-07-02 DIAGNOSIS — R7989 Other specified abnormal findings of blood chemistry: Secondary | ICD-10-CM

## 2020-07-02 DIAGNOSIS — R0602 Shortness of breath: Secondary | ICD-10-CM

## 2020-07-02 DIAGNOSIS — F17200 Nicotine dependence, unspecified, uncomplicated: Secondary | ICD-10-CM | POA: Diagnosis not present

## 2020-07-02 LAB — COMPREHENSIVE METABOLIC PANEL
ALT: 21 U/L (ref 0–35)
AST: 20 U/L (ref 0–37)
Albumin: 3.7 g/dL (ref 3.5–5.2)
Alkaline Phosphatase: 99 U/L (ref 39–117)
BUN: 21 mg/dL (ref 6–23)
CO2: 34 mEq/L — ABNORMAL HIGH (ref 19–32)
Calcium: 9.3 mg/dL (ref 8.4–10.5)
Chloride: 95 mEq/L — ABNORMAL LOW (ref 96–112)
Creatinine, Ser: 0.91 mg/dL (ref 0.40–1.20)
GFR: 58.09 mL/min — ABNORMAL LOW (ref >60.00)
Glucose, Bld: 208 mg/dL — ABNORMAL HIGH (ref 70–99)
Potassium: 4.2 mEq/L (ref 3.5–5.1)
Sodium: 135 mEq/L (ref 135–145)
Total Bilirubin: 0.5 mg/dL (ref 0.2–1.2)
Total Protein: 6.9 g/dL (ref 6.0–8.3)

## 2020-07-02 LAB — CBC WITH DIFFERENTIAL/PLATELET
Basophils Absolute: 0.1 10*3/uL (ref 0.0–0.1)
Basophils Relative: 0.7 % (ref 0.0–3.0)
Eosinophils Absolute: 0.1 10*3/uL (ref 0.0–0.7)
Eosinophils Relative: 1.7 % (ref 0.0–5.0)
HCT: 46 % (ref 36.0–46.0)
Hemoglobin: 15.9 g/dL — ABNORMAL HIGH (ref 12.0–15.0)
Lymphocytes Relative: 18.4 % (ref 12.0–46.0)
Lymphs Abs: 1.4 10*3/uL (ref 0.7–4.0)
MCHC: 34.6 g/dL (ref 30.0–36.0)
MCV: 95.8 fl (ref 78.0–100.0)
Monocytes Absolute: 0.5 10*3/uL (ref 0.1–1.0)
Monocytes Relative: 7 % (ref 3.0–12.0)
Neutro Abs: 5.5 10*3/uL (ref 1.4–7.7)
Neutrophils Relative %: 72.2 % (ref 43.0–77.0)
Platelets: 200 10*3/uL (ref 150.0–400.0)
RBC: 4.81 Mil/uL (ref 3.87–5.11)
RDW: 12.9 % (ref 11.5–15.5)
WBC: 7.6 10*3/uL (ref 4.0–10.5)

## 2020-07-02 LAB — BRAIN NATRIURETIC PEPTIDE: Pro B Natriuretic peptide (BNP): 134 pg/mL — ABNORMAL HIGH (ref 0.0–100.0)

## 2020-07-02 MED ORDER — ALBUTEROL SULFATE HFA 108 (90 BASE) MCG/ACT IN AERS: 2.0000 | INHALATION_SPRAY | Freq: Four times a day (QID) | RESPIRATORY_TRACT | 0 refills | Status: DC | PRN

## 2020-07-02 MED ORDER — HYDROCHLOROTHIAZIDE 12.5 MG PO CAPS: ORAL_CAPSULE | ORAL | Status: DC

## 2020-07-02 MED ORDER — METOPROLOL TARTRATE 100 MG PO TABS: ORAL_TABLET | ORAL | Status: DC

## 2020-07-02 MED ORDER — PRAVASTATIN SODIUM 10 MG PO TABS: 10.0000 mg | ORAL_TABLET | Freq: Every day | ORAL | Status: DC

## 2020-07-02 MED ORDER — DOXYCYCLINE HYCLATE 100 MG PO TABS: 100.0000 mg | ORAL_TABLET | Freq: Two times a day (BID) | ORAL | 0 refills | Status: DC

## 2020-07-02 MED ORDER — LEVOTHYROXINE SODIUM 50 MCG PO TABS: ORAL_TABLET | ORAL | Status: DC

## 2020-07-02 NOTE — Patient Instructions (Signed)
Go to the lab on the way out.   If you have mychart we'll likely use that to update you.    Keep taking lasix daily for now.  Keep working on stopping smoking.   I thank you for your effort.  Start doxycycline and use the inhaler if needed.   Let me know now you feel in a few days.   Take care.  Glad to see you.

## 2020-07-02 NOTE — Progress Notes (Signed)
This visit occurred during the SARS-CoV-2 public health emergency.  Safety protocols were in place, including screening questions prior to the visit, additional usage of staff PPE, and extensive cleaning of exam room while observing appropriate contact time as indicated for disinfecting solutions.  She doesn't feel well in general.  Fatigued.  She has diffuse leg weakness, meaning "I can walk a little then I have to sit down."  No vomiting.  No CP.  Some chest congestion, some sputum- yellow.  No fevers.  Can get mildly SOB in the AM.  Smoking d/w pt.  Down to 5-6 cigs a day.  She had prev quit for about 2 months.  Was started on lasix and foot edema resolved.  Taking lasix daily.  Lung nodule previously noted on CT but not yet due for follow-up.  Would be due around November 2022.  Meds, vitals, and allergies reviewed.   ROS: Per HPI unless specifically indicated in ROS section    GEN: nad, alert and oriented HEENT:ncat NECK: supple w/o LA CV: rrr.  no murmur PULM: B rhonchi on exam.  No inc wob, no wheeze.  No focal decrease in breath sounds ABD: soft, +bs EXT: no edema SKIN: no acute rash

## 2020-07-02 NOTE — Progress Notes (Signed)
Patient is an 86 year old female with known peripheral arterial disease.  She has previously undergone aortic and left common iliac stenting.  She still complains of pain in both legs.  She states this occurs after walking less than 15 yards.  She states that sometimes her legs get weak as soon as she comes to a standing position.  She still continues to smoke about a quarter to half a pack of cigarettes per day.  She is on Plavix and a statin.  She has chronically dusky appearing feet.  I suspect she has an underlying component of COPD or right heart dysfunction contributing to this.  She returns today after recent CT angiogram to evaluate peripheral arterial disease.  At her last office visit her right ABI had declined from 0.65 2.58.  Her left ABI had declined from 0.8-2.7.  She had an aortic iliac duplex scan which was nondiagnostic.  Physical exam:  Vitals:   07/02/20 0823  BP: 116/76  Pulse: 63  Resp: 16  Temp: 98.3 F (36.8 C)  SpO2: 91%  Weight: 103 lb (46.7 kg)  Height: 5\' 4"  (1.626 m)    Extremities: No palpable femoral popliteal or pedal pulses  Skin: Skin is dusky in appearance on the plantar aspect of both feet no ulcerations or open wounds  Data: I reviewed the images and interpreted them of the patient's CT angiogram from last week.  This shows severe left external iliac and internal iliac artery stenosis and chronic right superficial femoral artery occlusion.  The aortic and left common iliac stents are patent.  She has three-vessel runoff bilaterally.  Assessment: Discussed with patient today the possibility of an intervention for her lower extremities either left iliac stenting or potentially an intervention for her right superficial femoral artery.  However we discussed that some of her symptoms or not completely consistent with peripheral arterial disease as the weakness in her legs that occurs with standing this probably more spine related.  She is not currently at risk  of limb loss.  Plan: She is going to continue to try to quit smoking and start a walking program of 30 minutes daily.  She will follow-up in 3 months time with repeat ABIs and aortoiliac duplex.  If your symptoms become worse or become more debilitating then we will consider whether or not to do an intervention.  Ruta Hinds, MD [facility] Office: [phone]

## 2020-07-03 ENCOUNTER — Other Ambulatory Visit: Payer: Self-pay

## 2020-07-03 DIAGNOSIS — I739 Peripheral vascular disease, unspecified: Secondary | ICD-10-CM

## 2020-07-05 NOTE — Assessment & Plan Note (Signed)
Discussed smoking cessation.  Given pulmonary exam would continue albuterol, add on doxycycline, and check basic labs today.  See notes on labs.  Not yet due for follow-up chest CT.

## 2020-07-05 NOTE — Assessment & Plan Note (Signed)
With follow-up per vascular clinic.  See notes on labs.

## 2020-07-29 ENCOUNTER — Other Ambulatory Visit: Payer: Self-pay | Admitting: Family Medicine

## 2020-08-19 ENCOUNTER — Other Ambulatory Visit: Payer: Self-pay | Admitting: Family Medicine

## 2020-08-19 DIAGNOSIS — I739 Peripheral vascular disease, unspecified: Secondary | ICD-10-CM

## 2020-08-19 DIAGNOSIS — E785 Hyperlipidemia, unspecified: Secondary | ICD-10-CM

## 2020-08-28 ENCOUNTER — Other Ambulatory Visit: Payer: Self-pay | Admitting: Family Medicine

## 2020-09-15 ENCOUNTER — Other Ambulatory Visit: Payer: Self-pay | Admitting: Family Medicine

## 2020-09-15 ENCOUNTER — Ambulatory Visit: Payer: Medicare Other

## 2020-09-16 ENCOUNTER — Other Ambulatory Visit: Payer: Self-pay

## 2020-09-16 DIAGNOSIS — R0600 Dyspnea, unspecified: Secondary | ICD-10-CM

## 2020-09-16 MED ORDER — FUROSEMIDE 20 MG PO TABS: 20.0000 mg | ORAL_TABLET | Freq: Every day | ORAL | 1 refills | Status: DC

## 2020-09-16 NOTE — Telephone Encounter (Signed)
Sent. Thanks.   

## 2020-09-16 NOTE — Telephone Encounter (Signed)
Refill request from [facility]. Last filled on 06/18/20 # 30 with 3 refills to [facility].  LOV 07/02/20 for follow up Next appointment on 09/18/20 CPE

## 2020-09-18 ENCOUNTER — Encounter: Payer: Self-pay | Admitting: Family Medicine

## 2020-09-18 ENCOUNTER — Other Ambulatory Visit: Payer: Self-pay

## 2020-09-18 ENCOUNTER — Ambulatory Visit (INDEPENDENT_AMBULATORY_CARE_PROVIDER_SITE_OTHER): Payer: Medicare Other | Admitting: Family Medicine

## 2020-09-18 VITALS — BP 104/70 | HR 64 | Temp 97.3°F | Ht 64.0 in | Wt 99.5 lb

## 2020-09-18 DIAGNOSIS — R0602 Shortness of breath: Secondary | ICD-10-CM

## 2020-09-18 DIAGNOSIS — Z0000 Encounter for general adult medical examination without abnormal findings: Secondary | ICD-10-CM | POA: Diagnosis not present

## 2020-09-18 DIAGNOSIS — R739 Hyperglycemia, unspecified: Secondary | ICD-10-CM

## 2020-09-18 DIAGNOSIS — I10 Essential (primary) hypertension: Secondary | ICD-10-CM | POA: Diagnosis not present

## 2020-09-18 DIAGNOSIS — Z1231 Encounter for screening mammogram for malignant neoplasm of breast: Secondary | ICD-10-CM

## 2020-09-18 DIAGNOSIS — E785 Hyperlipidemia, unspecified: Secondary | ICD-10-CM | POA: Diagnosis not present

## 2020-09-18 DIAGNOSIS — Z7189 Other specified counseling: Secondary | ICD-10-CM

## 2020-09-18 DIAGNOSIS — E038 Other specified hypothyroidism: Secondary | ICD-10-CM

## 2020-09-18 DIAGNOSIS — I739 Peripheral vascular disease, unspecified: Secondary | ICD-10-CM

## 2020-09-18 DIAGNOSIS — Z780 Asymptomatic menopausal state: Secondary | ICD-10-CM

## 2020-09-18 LAB — LIPID PANEL
Cholesterol: 175 mg/dL (ref 0–200)
HDL: 71.2 mg/dL (ref >39.00)
LDL Cholesterol: 84 mg/dL (ref 0–99)
NonHDL: 103.49
Total CHOL/HDL Ratio: 2
Triglycerides: 99 mg/dL (ref 0.0–149.0)
VLDL: 19.8 mg/dL (ref 0.0–40.0)

## 2020-09-18 LAB — HEMOGLOBIN A1C: Hgb A1c MFr Bld: 5.7 % (ref 4.6–6.5)

## 2020-09-18 MED ORDER — BUDESONIDE-FORMOTEROL FUMARATE 160-4.5 MCG/ACT IN AERO: 2.0000 | INHALATION_SPRAY | Freq: Two times a day (BID) | RESPIRATORY_TRACT | 3 refills | Status: DC

## 2020-09-18 MED ORDER — ALBUTEROL SULFATE HFA 108 (90 BASE) MCG/ACT IN AERS: INHALATION_SPRAY | RESPIRATORY_TRACT | 3 refills | Status: DC

## 2020-09-18 NOTE — Patient Instructions (Addendum)
If your weight isn't gradually going up then let me know.   Take care.  Glad to see you. Go to the lab on the way out.   If you have mychart we'll likely use that to update you.    We'll call about the bone density and mammogram.  Let me know if symbicort is too expensive.  Rinse after using.  Use albuterol if needed.

## 2020-09-18 NOTE — Progress Notes (Signed)
This visit occurred during the SARS-CoV-2 public health emergency.  Safety protocols were in place, including screening questions prior to the visit, additional usage of staff PPE, and extensive cleaning of exam room while observing appropriate contact time as indicated for disinfecting solutions.  I have personally reviewed the Medicare Annual Wellness questionnaire and have noted 1. The patient's medical and social history 2. Their use of alcohol, tobacco or illicit drugs 3. Their current medications and supplements 4. The patient's functional ability including ADL's, fall risks, home safety risks and hearing or visual             impairment. 5. Diet and physical activities 6. Evidence for depression or mood disorders  The patients weight, height, BMI have been recorded in the chart and visual acuity is per eye clinic.  I have made referrals, counseling and provided education to the patient based review of the above and I have provided the pt with a written personalized care plan for preventive services.  Provider list updated- see scanned forms.  Routine anticipatory guidance given to patient.  See health maintenance. The possibility exists that previously documented standard health maintenance information may have been brought forward from a previous encounter into this note.  If needed, that same information has been updated to reflect the current situation based on today's encounter.    Flu prev done Shingles prev done x2 at pharmacy per patient report, in 2021 PNA UTD Tetanus 2012 Covid vaccine UTD Colon CA screening deferred given her age Breast cancer screening pending Bone density test pending Advance directive-daughter Thayer Headings designated if patient were incapacitated. Cognitive function addressed- see scanned forms- and if abnormal then additional documentation follows.   In addition to [facility], follow up visit for the below conditions:  She had lost some weight with  a recent illness but is gaining it back per patient report.  She is up 2 lbs from earlier this week.  Her appetite was down earlier.  She feels better in the meantime.   PAD.  No bleeding.  Compliant with Plavix. D/w pt about smoking cessation, she tried mult options but had had rash with patch use.  D/w pt about gum use.    Hypertension:    Using medication without problems or lightheadedness: yes Chest pain with exertion:no Edema:no Short of breath: as expected with smoking hx, dec sx with inhaler.  Using SABA about BID usually.    Hypothyroidism.  Compliant.  TSH wnl.  No neck mass or dysphagia.    Elevated Cholesterol: Using medications without problems: yes Muscle aches: no Diet compliance: encouraged.   Exercise: encouraged as tolerated.    PMH and SH reviewed  Meds, vitals, and allergies reviewed.   ROS: Per HPI.  Unless specifically indicated otherwise in HPI, the patient denies:  General: fever. Eyes: acute vision changes ENT: sore throat Cardiovascular: chest pain Respiratory: SOB GI: vomiting GU: dysuria Musculoskeletal: acute back pain Derm: acute rash Neuro: acute motor dysfunction Psych: worsening mood Endocrine: polydipsia Heme: bleeding Allergy: hayfever  GEN: nad, alert and oriented HEENT: ncat NECK: supple w/o LA CV: rrr. PULM: ctab, no inc wob ABD: soft, +bs EXT: no edema SKIN: no acute rash

## 2020-09-22 DIAGNOSIS — R0602 Shortness of breath: Secondary | ICD-10-CM | POA: Insufficient documentation

## 2020-09-22 DIAGNOSIS — R739 Hyperglycemia, unspecified: Secondary | ICD-10-CM | POA: Insufficient documentation

## 2020-09-22 NOTE — Assessment & Plan Note (Addendum)
Continue enalapril hydrochlorothiazide metoprolol.  Previous labs discussed with patient.

## 2020-09-22 NOTE — Assessment & Plan Note (Signed)
Continue pravastatin.  See notes on labs. 

## 2020-09-22 NOTE — Assessment & Plan Note (Signed)
Flu prev done Shingles prev done x2 at pharmacy per patient report, in 2021 PNA UTD Tetanus 2012 Covid vaccine UTD Colon CA screening deferred given her age Breast cancer screening pending Bone density test pending Advance directive-daughter Jill Franco designated if patient were incapacitated. Cognitive function addressed- see scanned forms- and if abnormal then additional documentation follows.

## 2020-09-22 NOTE — Assessment & Plan Note (Signed)
History of.  See notes on A1c.

## 2020-09-22 NOTE — Assessment & Plan Note (Signed)
Advance directive-daughter Thayer Headings designated if patient were incapacitated.

## 2020-09-22 NOTE — Assessment & Plan Note (Signed)
Continue levothyroxine 

## 2020-09-22 NOTE — Assessment & Plan Note (Signed)
Episodic, with exertion.  Presumed COPD from smoke exposure.  Reasonable to add on Symbicort routine cautions discussed with patient.  See after visit summary.

## 2020-09-22 NOTE — Assessment & Plan Note (Signed)
Continue Plavix.  Discussed smoking cessation.  See above.

## 2020-09-23 ENCOUNTER — Telehealth: Payer: Self-pay

## 2020-09-23 MED ORDER — ALBUTEROL SULFATE HFA 108 (90 BASE) MCG/ACT IN AERS: 2.0000 | INHALATION_SPRAY | Freq: Four times a day (QID) | RESPIRATORY_TRACT | 5 refills | Status: DC | PRN

## 2020-09-23 NOTE — Telephone Encounter (Signed)
Ventolin HRA inhaler is not covered by patients insurance. Covered alternatives are: Proventil HFA inhaler or Proair HFA inhaler. Do you want to send in one of these or do PA?

## 2020-09-23 NOTE — Telephone Encounter (Signed)
Patient aware of change and rx was sent.

## 2020-09-23 NOTE — Telephone Encounter (Signed)
Proair sent.  Notify pt.  Reasonable substitution.  It's the same med.  Thanks.

## 2020-09-28 ENCOUNTER — Telehealth: Payer: Self-pay | Admitting: Family Medicine

## 2020-09-28 NOTE — Telephone Encounter (Signed)
Jill Franco called in and stated that she gets her prescription in the mail and she has some inhalers and she was charged 130$ and then the a week later she received another inhaler (albuterol) and that's the one she wants, she doesn't want the symbicort inhaler.

## 2020-09-30 NOTE — Telephone Encounter (Signed)
Spoke with patient and she stated she did not want to use the symbicort because it was so expensive. I advised patient to go ahead and use it and call us when she is on her last one (pharmacy gave her a 3 month supply) and we could change it to something else. Patient is going to use the symbicort until she is out and will call back for a different rx.

## 2020-10-05 ENCOUNTER — Other Ambulatory Visit: Payer: Self-pay | Admitting: Family Medicine

## 2020-10-05 DIAGNOSIS — E2839 Other primary ovarian failure: Secondary | ICD-10-CM

## 2020-10-05 NOTE — Progress Notes (Signed)
[facility] [place]  ASSESSMENT / PLAN: Jill Franco is a 86 y.o. female with atherosclerosis of native and stented aortoiliac occlusive disease as well as bilateral lower extremity arteries causing causing disabling claudication.  Patient counseled patients with asymptomatic peripheral arterial disease or claudication have a 1-2% risk of developing chronic limb threatening ischemia, but a 15-30% risk of mortality in the next 5 years. Intervention should only be considered for medically optimized patients with disabling symptoms. .  Recommend the following which can slow the progression of atherosclerosis and reduce the risk of major adverse cardiac / limb events:  Complete cessation from all tobacco products. Blood glucose control with goal A1c < 7%. Blood pressure control with goal blood pressure < 140/90 mmHg. Lipid reduction therapy with goal LDL-C <100 mg/dL (<70 if symptomatic from PAD).  Aspirin '81mg'$  PO QD.  Atorvastatin 40-'80mg'$  PO QD (or other "high intensity" statin therapy).  Patient is clinically deteriorating despite medical therapy.  She is not able to participate in exercise therapy because of disabling symptoms.  She is working hard on quitting.  I think it is reasonable to pursue a inflow procedure in her left leg to try to palliate her symptoms and get her walking again.  Tentative plan is for left femoral endarterectomy with retrograde iliac stenting.  She has not seen a cardiologist before.  We will refer her for preoperative risk stratification.  Follow-up with me after cardiology evaluation.  CHIEF COMPLAINT: Calf pain with walking  HISTORY OF PRESENT ILLNESS: Jill Franco is a 86 y.o. female who returns to clinic for surveillance of peripheral arterial disease.  She reports deteriorating claudication symptoms.  She is now disabled by her symptoms.  She can only walk a short distance before severe pain sets in bilateral calves.  She has no  wounds about her feet.  She occasionally has pain in her foot at rest, but does not report symptoms typical of ischemic rest pain.  She continues to smoke, but is working hard to quit.  She is using patches with some success.  VASCULAR SURGICAL HISTORY:  left common iliac stent (6 x 40), terminal aortic stent (10 x 19) 03/18/16 ([facility])  VASCULAR RISK FACTORS: Positive history of stroke / transient ischemic attack. Negative history of coronary artery disease.  Negative history of diabetes mellitus. Last A1c 5.7. Positive history of smoking. Not actively smoking. Positive history of hypertension.  No reported history of chronic kidney disease but last GFR 58-73. CKD stage 2 - 3a. Negative history of chronic obstructive pulmonary disease, but patient has been treated with inhalers with symptomatic relief.   FUNCTIONAL STATUS: ECOG performance status: (1) Restricted in physically strenuous activity, ambulatory and able to do work of light nature Ambulatory status: Ambulatory within the community with limits  Past Medical History:  Diagnosis Date   Cataract    bilaterally corrected.   CVA (cerebral vascular accident) ([place])    Pt was unaware and does not know when it happened.  Was told after some testing was done.   Double vision    Facial droop 10/2017   left   History of shingles 2012   HLD (hyperlipidemia)    HTN (hypertension)    Hypothyroidism    PVD (peripheral vascular disease) ([place])    Vision loss    temporary    Past Surgical History:  Procedure Laterality Date   ABDOMINAL EXPLORATION SURGERY  1950s   "they thought I was pregnant; went in to explore"  APPENDECTOMY  childhood   BACK SURGERY     BREAST LUMPECTOMY Left 1960's   benign   carotid ultrasound  06/19/2007   0-39% stenosis   CATARACT EXTRACTION W/ INTRAOCULAR LENS IMPLANT Left 09/2016   CATARACT EXTRACTION W/ INTRAOCULAR LENS IMPLANT Right 10/2016   COLONOSCOPY WITH PROPOFOL N/A 04/08/2019   Procedure:  COLONOSCOPY WITH PROPOFOL;  Surgeon: Milus Banister, MD;  Location: [facility];  Service: Endoscopy;  Laterality: N/A;   DILATION AND CURETTAGE OF UTERUS  1960's X 3   miscarriages   DOPPLER ECHOCARDIOGRAPHY  06/19/2007   Normal EF 55-70%   ILIAC ARTERY STENT Left 03/18/2016   common iliac stent (6 x 40), aortic stent (10 x 19)/notes 03/18/2016   LAMINECTOMY AND MICRODISCECTOMY SPINE  1960's X 2   MOLE REMOVAL  07/10/2016   NSVD     x1   PERIPHERAL VASCULAR CATHETERIZATION N/A 03/18/2016   Procedure: Abdominal Aortogram w/Lower Extremity;  Surgeon: Elam Dutch, MD;  Location: [facility];  Service: Cardiovascular;  Laterality: N/A;   PERIPHERAL VASCULAR CATHETERIZATION  03/18/2016   Procedure: Peripheral Vascular Intervention;  Surgeon: Elam Dutch, MD;  Location: [facility];  Service: Cardiovascular;;   POLYPECTOMY  04/08/2019   Procedure: POLYPECTOMY;  Surgeon: Milus Banister, MD;  Location: [facility];  Service: Endoscopy;;    Family History  Problem Relation Age of Onset   Stroke Father 100   Cancer Brother    Colon cancer Neg Hx    Breast cancer Neg Hx    Colon polyps Neg Hx    Esophageal cancer Neg Hx    Rectal cancer Neg Hx    Stomach cancer Neg Hx     Social History   Socioeconomic History   Marital status: Widowed    Spouse name: Not on file   Number of children: 1   Years of education: Not on file   Highest education level: Not on file  Occupational History   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     Her daughter is helping at home some (some as of 2022).  Her daughter has vision loss and is widowed as of 2022 and moved in with patient.   Social Determinants of Health   Financial Resource Strain: Not on file  Food Insecurity: Not on file  Transportation Needs: Not on file  Physical Activity: Not on file  Stress: Not on file  Social Connections: Not on file  Intimate Partner Violence: Not on file    Allergies  Allergen Reactions   Aspirin Other (See Comments)    REACTION: Uncoated Stomach upset on empty stomach   Codeine Nausea Only and Other (See Comments)    Pill needs to be E.coated   Ibuprofen Nausea And Vomiting   Lipitor [Atorvastatin] Other (See Comments)  myalgia    Current Outpatient Medications  Medication Sig Dispense Refill   albuterol (PROAIR HFA) 108 (90 Base) MCG/ACT inhaler Inhale 2 puffs into the lungs every 6 (six) hours as needed for wheezing or shortness of breath. 18 g 5   budesonide-formoterol (SYMBICORT) 160-4.5 MCG/ACT inhaler Inhale 2 puffs into the lungs 2 (two) times daily. Rinse after use 1 each 3   clopidogrel (PLAVIX) 75 MG tablet TAKE 1 TABLET BY MOUTH  DAILY 90 tablet 3   enalapril (VASOTEC) 10 MG tablet TAKE 1 AND 1/2 TABLETS BY  MOUTH DAILY 135 tablet 3   furosemide (LASIX) 20 MG tablet Take 1 tablet (20 mg total) by mouth daily. 90 tablet 1   hydrochlorothiazide (MICROZIDE) 12.5 MG capsule TAKE 1 CAPSULE BY MOUTH IN  THE MORNING 90 capsule 3   levothyroxine (SYNTHROID) 50 MCG tablet TAKE 1 TABLET BY MOUTH  DAILY EXCEPT 2 TABLETS ON  SUNDAYS AND WEDNESDAYS (  TOTAL 9 TABS PER WEEK) 117 tablet 3   metoprolol tartrate (LOPRESSOR) 100 MG tablet TAKE ONE-HALF TABLET BY  MOUTH IN THE MORNING AND 1  TABLET BY MOUTH IN THE  EVENING 135 tablet 3   pravastatin (PRAVACHOL) 10 MG tablet TAKE 1 TABLET BY MOUTH  DAILY 90 tablet 3   No current  facility-administered medications for this visit.    REVIEW OF SYSTEMS:  '[X]'$  denotes positive finding, '[ ]'$  denotes negative finding Cardiac  Comments:  Chest pain or chest pressure:    Shortness of breath upon exertion:    Short of breath when lying flat:    Irregular heart rhythm:        Vascular    Pain in calf, thigh, or hip brought on by ambulation:    Pain in feet at night that wakes you up from your sleep:     Blood clot in your veins:    Leg swelling:         Pulmonary    Oxygen at home:    Productive cough:     Wheezing:         Neurologic    Sudden weakness in arms or legs:     Sudden numbness in arms or legs:     Sudden onset of difficulty speaking or slurred speech:    Temporary loss of vision in one eye:     Problems with dizziness:         Gastrointestinal    Blood in stool:     Vomited blood:         Genitourinary    Burning when urinating:     Blood in urine:        Psychiatric    Major depression:         Hematologic    Bleeding problems:    Problems with blood clotting too easily:        Skin    Rashes or ulcers:        Constitutional    Fever or chills:      PHYSICAL EXAM Vitals:   10/06/20 0944  BP: 135/74  Pulse: (!) 58  Resp: 20  Temp: 97.9 F (36.6 C)  SpO2: 99%  Weight: 101 lb (45.8 kg)  Height: '5\' 4"'$  (1.626 m)    Constitutional: well appearing. no distress. Thin. Appears marginally nourished.  Neurologic: CN intact. no focal findings. no sensory loss. Psychiatric:  Mood and affect symmetric and appropriate. Eyes:  No icterus. No conjunctival pallor. Ears, nose, throat:  mucous membranes moist. Midline trachea.  Cardiac: regular rate and rhythm.  Respiratory:  unlabored. Abdominal:  soft, non-tender, non-distended.  Peripheral vascular: absent left femoral pulse Extremity: no edema. no cyanosis. no pallor.  Skin: no gangrene. no ulceration. Atrophic skin. Lymphatic: no Stemmer's sign. no palpable  lymphadenopathy.  PERTINENT LABORATORY AND RADIOLOGIC DATA  Most recent CBC CBC Latest Ref Rng & Units 07/02/2020 06/05/2020 02/05/2020  WBC 4.0 - 10.5 K/uL 7.6 9.4 6.1  Hemoglobin 12.0 - 15.0 g/dL 15.9(H) 15.2(H) 16.4(H)  Hematocrit 36.0 - 46.0 % 46.0 44.2 48.5(H)  Platelets 150.0 - 400.0 K/uL 200.0 123(L) 173.0     Most recent CMP CMP Latest Ref Rng & Units 07/02/2020 06/16/2020 06/05/2020  Glucose 70 - 99 mg/dL 208(H) 162(H) 120(H)  BUN 6 - 23 mg/dL '21 15 20  '$ Creatinine 0.40 - 1.20 mg/dL 0.91 0.72 0.75  Sodium 135 - 145 mEq/L 135 132(L) 128(L)  Potassium 3.5 - 5.1 mEq/L 4.2 4.3 3.4(L)  Chloride 96 - 112 mEq/L 95(L) 95(L) 95(L)  CO2 19 - 32 mEq/L 34(H) 32 23  Calcium 8.4 - 10.5 mg/dL 9.3 8.7 8.1(L)  Total Protein 6.0 - 8.3 g/dL 6.9 6.0 5.7(L)  Total Bilirubin 0.2 - 1.2 mg/dL 0.5 0.7 1.3(H)  Alkaline Phos 39 - 117 U/L 99 167(H) 129(H)  AST 0 - 37 U/L 20 49(H) 105(H)  ALT 0 - 35 U/L 21 68(H) 67(H)    Renal function CrCl cannot be calculated (Patient's most recent lab result is older than the maximum 21 days allowed.).  Hgb A1c MFr Bld (%)  Date Value  09/18/2020 5.7    LDL Cholesterol  Date Value Ref Range Status  09/18/2020 84 0 - 99 mg/dL Final   Direct LDL  Date Value Ref Range Status  06/19/2012 115.8 mg/dL Final    Comment:    Optimal:  <100 mg/dLNear or Above Optimal:  100-129 mg/dLBorderline High:  130-159 mg/dLHigh:  160-189 mg/dLVery High:  >190 mg/dL     Vascular Imaging: LOWER EXTREMITY DOPPLER STUDY   Patient Name:  Jill TRENKAMP  Date of Exam:   10/06/2020  Medical Rec #: [id number]         Accession #:    [id number]  Date of Birth: 24-Oct-1934         Patient Gender: F  Patient Age:   4 years  Exam Location:  [facility]  Procedure:      VAS [place] ABI WITH/WO TBI  Referring Phys: CHARLES FIELDS    ---------------------------------------------------------------------------  -----     Indications: Claudication, and peripheral artery  disease.   High Risk Factors: Hypertension, hyperlipidemia, past history of smoking.   Other Factors: Patient complains of constant lower extremity pain, appears  to be                 getting worse   Vascular Interventions: Left CIA and aortic stent 03/18/2016.   Comparison Study: Increase in toe-brachial indeces since prior exam of  4/282022   Performing Technologist: Alvia Grove RVT      Examination Guidelines: A complete evaluation includes at minimum, Doppler  waveform signals and systolic blood pressure reading at the level of  bilateral  brachial, anterior tibial, and posterior tibial arteries, when vessel  segments  are accessible. Bilateral testing is considered an integral part of a  complete  examination. Photoelectric Plethysmograph (PPG) waveforms and toe systolic  pressure readings are included as required and additional duplex testing  as  needed. Limited  examinations for reoccurring indications may be performed  as  noted.      ABI Findings:  +---------+------------------+-----+----------+--------+  Right    Rt Pressure (mmHg)IndexWaveform  Comment   +---------+------------------+-----+----------+--------+  Brachial 162                                        +---------+------------------+-----+----------+--------+  PTA      105               0.64 monophasic          +---------+------------------+-----+----------+--------+  DP       98                0.59 monophasic          +---------+------------------+-----+----------+--------+  Great Toe64                0.39 Abnormal            +---------+------------------+-----+----------+--------+   +---------+------------------+-----+----------+-------+  Left     Lt Pressure (mmHg)IndexWaveform  Comment  +---------+------------------+-----+----------+-------+  Brachial 165                                       +---------+------------------+-----+----------+-------+  PTA       89                0.54 monophasic         +---------+------------------+-----+----------+-------+  DP       70                0.42 monophasic         +---------+------------------+-----+----------+-------+  Great Toe42                0.25 Abnormal           +---------+------------------+-----+----------+-------+   +-------+-----------+-----------+------------+------------+  ABI/TBIToday's ABIToday's TBIPrevious ABIPrevious TBI  +-------+-----------+-----------+------------+------------+  Right  0.64       0.39       0.58        absent        +-------+-----------+-----------+------------+------------+  Left   0.54       0.25       0.68        absent        +-------+-----------+-----------+------------+------------+             Summary:  Right: Resting right ankle-brachial index indicates moderate right lower  extremity arterial disease. The right toe-brachial index is abnormal.   Left: Resting left ankle-brachial index indicates moderate left lower  extremity arterial disease. The left toe-brachial index is abnormal.       *See table(s) above for measurements and observations.       Electronically signed by Jamelle Haring on 10/06/2020 at 10:11:55 AM.   ABDOMINAL AORTA STUDY   Patient Name:  Jill Franco Franco  Date of Exam:   10/06/2020  Medical Rec #: [id number]         Accession #:    [id number]  Date of Birth: Apr 01, 1934         Patient Gender: F  Patient Age:   47 years  Exam Location:  [facility]  Procedure:      VAS US AORTA/IVC/ILIACS  Referring Phys: Juanda Crumble FIELDS    ---------------------------------------------------------------------------  -----     Risk Factors: Hypertension, hyperlipidemia, past  history of smoking.   Vascular Interventions: Left CIA and aortic stent 03/18/2016.   Limitations: Air/bowel gas. Calcific plaque noted throughout.      Performing Technologist: Alvia Grove RVT       Examination Guidelines: A complete evaluation includes B-mode imaging,  spectral  Doppler, color Doppler, and power Doppler as needed of all accessible  portions  of each vessel. Bilateral testing is considered an integral part of a  complete  examination. Limited examinations for reoccurring indications may be  performed  as noted.      Abdominal Aorta Findings:  +-------------+-------+----------+----------+----------+--------+--------+  Location     AP (cm)Trans (cm)PSV (cm/s)Waveform  ThrombusComments  +-------------+-------+----------+----------+----------+--------+--------+  Proximal                      47                                    +-------------+-------+----------+----------+----------+--------+--------+  Mid                           38                                    +-------------+-------+----------+----------+----------+--------+--------+  Distal                        120                                   +-------------+-------+----------+----------+----------+--------+--------+  RT CIA Prox                   117       monophasic                  +-------------+-------+----------+----------+----------+--------+--------+  RT CIA Mid                    280       monophasic                  +-------------+-------+----------+----------+----------+--------+--------+  RT CIA Distal                 128       monophasic                  +-------------+-------+----------+----------+----------+--------+--------+  RT EIA Prox                                               NV        +-------------+-------+----------+----------+----------+--------+--------+  RT EIA Mid                    137       monophasic                  +-------------+-------+----------+----------+----------+--------+--------+  RT EIA Distal                 125       monophasic                   +-------------+-------+----------+----------+----------+--------+--------+  LT CIA Prox                                               stent     +-------------+-------+----------+----------+----------+--------+--------+  LT CIA Mid                                                stent     +-------------+-------+----------+----------+----------+--------+--------+  LT CIA Distal                                             stent     +-------------+-------+----------+----------+----------+--------+--------+  LT EIA Prox                   459       monophasic                  +-------------+-------+----------+----------+----------+--------+--------+  LT EIA Mid                    66        monophasic                  +-------------+-------+----------+----------+----------+--------+--------+  LT EIA Distal                 85        monophasic                  +-------------+-------+----------+----------+----------+--------+--------+   Right CFA 47 cm.s monophasic waveform. Left CFA 41 cm/s monophasic  waveform.   Left Stent(s):  +---------------+--------+---------------+----------+---------------------+   Left CIA stent PSV cm/sStenosis       Waveform  Comments                +---------------+--------+---------------+----------+---------------------+   Prox to Stent  149                    monophasic                        +---------------+--------+---------------+----------+---------------------+   Proximal Stent 113                    monophasic                        +---------------+--------+---------------+----------+---------------------+   Mid Stent      130                    monophasic                        +---------------+--------+---------------+----------+---------------------+   Distal Stent   144                    monophasiclimited  visualization   +---------------+--------+---------------+----------+---------------------+   Distal to Stent599     50-99% stenosisstenotic  limited  visualization  +---------------+--------+---------------+----------+---------------------+                   Summary:  IVC/Iliac: Suboptimal exam.   - Greater than 50% stenosis in the right  CIA  - 50 - 99% stenosis distal to stent and proximal EIA, limited  visualization    NOTE: Further imaging may be warranted.     *See table(s) above for measurements and observations.     Electronically signed by Jamelle Haring on 10/06/2020 at 10:11:37 AM.   Yevonne Aline. Stanford Breed, MD [facility] Phone Number: [phone] 10/05/2020 4:02 PM

## 2020-10-06 ENCOUNTER — Ambulatory Visit (HOSPITAL_COMMUNITY)
Admission: RE | Admit: 2020-10-06 | Discharge: 2020-10-06 | Disposition: A | Discharge status: Home / Self Care | Payer: Medicare Other | Source: Ambulatory Visit | Attending: Vascular Surgery | Admitting: Vascular Surgery

## 2020-10-06 ENCOUNTER — Encounter: Payer: Self-pay | Admitting: Vascular Surgery

## 2020-10-06 ENCOUNTER — Other Ambulatory Visit: Payer: Self-pay

## 2020-10-06 ENCOUNTER — Ambulatory Visit (INDEPENDENT_AMBULATORY_CARE_PROVIDER_SITE_OTHER): Payer: Medicare Other | Admitting: Vascular Surgery

## 2020-10-06 ENCOUNTER — Ambulatory Visit (INDEPENDENT_AMBULATORY_CARE_PROVIDER_SITE_OTHER)
Admission: RE | Admit: 2020-10-06 | Discharge: 2020-10-06 | Disposition: A | Discharge status: Home / Self Care | Payer: Medicare Other | Source: Ambulatory Visit | Attending: Vascular Surgery | Admitting: Vascular Surgery

## 2020-10-06 VITALS — BP 135/74 | HR 58 | Temp 97.9°F | Resp 20 | Ht 64.0 in | Wt 101.0 lb

## 2020-10-06 DIAGNOSIS — I739 Peripheral vascular disease, unspecified: Secondary | ICD-10-CM | POA: Diagnosis not present

## 2020-10-08 ENCOUNTER — Ambulatory Visit
Admission: RE | Admit: 2020-10-08 | Discharge: 2020-10-08 | Disposition: A | Discharge status: Home / Self Care | Payer: Medicare Other | Source: Ambulatory Visit | Attending: Family Medicine | Admitting: Family Medicine

## 2020-10-08 ENCOUNTER — Other Ambulatory Visit: Payer: Self-pay

## 2020-10-08 DIAGNOSIS — M810 Age-related osteoporosis without current pathological fracture: Secondary | ICD-10-CM | POA: Diagnosis not present

## 2020-10-08 DIAGNOSIS — Z1231 Encounter for screening mammogram for malignant neoplasm of breast: Secondary | ICD-10-CM | POA: Diagnosis not present

## 2020-10-08 DIAGNOSIS — Z780 Asymptomatic menopausal state: Secondary | ICD-10-CM | POA: Diagnosis not present

## 2020-10-08 DIAGNOSIS — E2839 Other primary ovarian failure: Secondary | ICD-10-CM

## 2020-10-11 ENCOUNTER — Encounter: Payer: Self-pay | Admitting: Family Medicine

## 2020-10-11 DIAGNOSIS — M810 Age-related osteoporosis without current pathological fracture: Secondary | ICD-10-CM | POA: Insufficient documentation

## 2020-10-13 ENCOUNTER — Telehealth: Payer: Self-pay | Admitting: Family Medicine

## 2020-10-13 NOTE — Chronic Care Management (AMB) (Signed)
  Chronic Care Management   Note  10/13/2020 Name: Jill Franco MRN: [id number] DOB: 1934/07/22  [name] is a 86 y.o. year old female who is a primary care patient of Tonia Ghent, MD. I reached out to [name] by phone today in response to a referral sent by [name]'s PCP, Tonia Ghent, MD.   [name] was given information about Chronic Care Management services today including:  CCM service includes personalized support from designated clinical staff supervised by her physician, including individualized plan of care and coordination with other care providers 24/7 contact phone numbers for assistance for urgent and routine care needs. Service will only be billed when office clinical staff spend 20 minutes or more in a month to coordinate care. Only one practitioner may furnish and bill the service in a calendar month. The patient may stop CCM services at any time (effective at the end of the month) by phone call to the office staff.   Patient wishes to consider information provided and/or speak with a member of the care team before deciding about enrollment in care management services.   Follow up plan:   [name]

## 2020-10-20 ENCOUNTER — Encounter: Payer: Self-pay | Admitting: Cardiology

## 2020-10-20 ENCOUNTER — Other Ambulatory Visit: Payer: Self-pay

## 2020-10-20 ENCOUNTER — Ambulatory Visit: Payer: Medicare Other | Admitting: Cardiology

## 2020-10-20 VITALS — BP 122/59 | HR 62 | Temp 98.0°F | Resp 17 | Ht 64.0 in | Wt 102.2 lb

## 2020-10-20 DIAGNOSIS — I6523 Occlusion and stenosis of bilateral carotid arteries: Secondary | ICD-10-CM

## 2020-10-20 DIAGNOSIS — E7800 Pure hypercholesterolemia, unspecified: Secondary | ICD-10-CM

## 2020-10-20 DIAGNOSIS — I739 Peripheral vascular disease, unspecified: Secondary | ICD-10-CM | POA: Diagnosis not present

## 2020-10-20 DIAGNOSIS — R0600 Dyspnea, unspecified: Secondary | ICD-10-CM

## 2020-10-20 DIAGNOSIS — F17210 Nicotine dependence, cigarettes, uncomplicated: Secondary | ICD-10-CM | POA: Diagnosis not present

## 2020-10-20 DIAGNOSIS — R0609 Other forms of dyspnea: Secondary | ICD-10-CM

## 2020-10-20 MED ORDER — ROSUVASTATIN CALCIUM 20 MG PO TABS: 20.0000 mg | ORAL_TABLET | Freq: Every day | ORAL | 3 refills | Status: DC

## 2020-10-20 NOTE — Progress Notes (Signed)
Primary Physician/Referring:  Tonia Ghent, MD  Patient ID: Jill Franco, female    DOB: 1934/03/22, 86 y.o.   MRN: [id number]  Chief Complaint  Patient presents with   New Patient (Initial Visit)   Medical Clearance   PAD   HPI:    Jill Franco  is a 86 y.o. Caucasian female with history of hypertension, hyperlipidemia, PAD, tobacco use (smokes about half pack per day), patient reported history of CVA (unclear when), COPD, and hypothyroidism.  Denies history of diabetes, MI. Referred to our office for preoperative risk stratification by Dr. Fuller Song (vascular surgery) as patient has planned upcoming left femoral endarterectomy with retrograde iliac stenting.   Patient reports a life limiting claudication of the lower extremities.  She has history of stenting in the right leg by vascular surgery.  Vascular surgery follows her for PAD as well as carotid artery stenosis bilaterally.  Patient reports over the last 6 months to 1 year she has noticed intermittent bilateral lower leg edema as well as dyspnea on exertion, which she has been attributing to underlying COPD.  Patient has been taking Lasix 20 mg daily from her PCP due to bilateral leg swelling, which provides relief.  Patient lives with her daughter, who accompanies her to today's office visit as well.  Past Medical History:  Diagnosis Date   Cataract    bilaterally corrected.   CVA (cerebral vascular accident) ([place])    Pt was unaware and does not know when it happened.  Was told after some testing was done.   Double vision    Facial droop 10/2017   left   History of shingles 2012   HLD (hyperlipidemia)    HTN (hypertension)    Hypothyroidism    PVD (peripheral vascular disease) ([place])    Vision loss    temporary   Past Surgical History:  Procedure Laterality Date   ABDOMINAL EXPLORATION SURGERY  1950s   "they thought I was pregnant; went in to explore"   APPENDECTOMY  childhood   BACK SURGERY      BREAST LUMPECTOMY Left 1960's   benign   carotid ultrasound  06/19/2007   0-39% stenosis   CATARACT EXTRACTION W/ INTRAOCULAR LENS IMPLANT Left 09/2016   CATARACT EXTRACTION W/ INTRAOCULAR LENS IMPLANT Right 10/2016   COLONOSCOPY WITH PROPOFOL N/A 04/08/2019   Procedure: COLONOSCOPY WITH PROPOFOL;  Surgeon: Milus Banister, MD;  Location: [facility];  Service: Endoscopy;  Laterality: N/A;   DILATION AND CURETTAGE OF UTERUS  1960's X 3   miscarriages   DOPPLER ECHOCARDIOGRAPHY  06/19/2007   Normal EF 55-70%   ILIAC ARTERY STENT Left 03/18/2016   common iliac stent (6 x 40), aortic stent (10 x 19)/notes 03/18/2016   LAMINECTOMY AND MICRODISCECTOMY SPINE  1960's X 2   MOLE REMOVAL  07/10/2016   NSVD     x1   PERIPHERAL VASCULAR CATHETERIZATION N/A 03/18/2016   Procedure: Abdominal Aortogram w/Lower Extremity;  Surgeon: Elam Dutch, MD;  Location: [place] [facility];  Service: Cardiovascular;  Laterality: N/A;   PERIPHERAL VASCULAR CATHETERIZATION  03/18/2016   Procedure: Peripheral Vascular Intervention;  Surgeon: Elam Dutch, MD;  Location: [facility];  Service: Cardiovascular;;   POLYPECTOMY  04/08/2019   Procedure: POLYPECTOMY;  Surgeon: Milus Banister, MD;  Location: [facility];  Service: Endoscopy;;   Family History  Problem Relation Age of Onset   Stroke Father 46   Cancer Brother    Colon cancer  Neg Hx    Breast cancer Neg Hx    Colon polyps Neg Hx    Esophageal cancer Neg Hx    Rectal cancer Neg Hx    Stomach cancer Neg Hx     Social History   Tobacco Use   Smoking status: Every Day    Packs/day: 0.25    Years: 69.00    Pack years: 17.25    Types: Cigarettes    Last attempt to quit: 04/21/2019    Years since quitting: 1.5   Smokeless tobacco: Never  Substance Use Topics   Alcohol use: No    Alcohol/week: 0.0 standard drinks   Marital Status: Widowed   ROS  Review of Systems  Constitutional: Negative for malaise/fatigue and weight gain.   Cardiovascular:  Positive for dyspnea on exertion and leg swelling (intermittent). Negative for chest pain, claudication, near-syncope, orthopnea, palpitations, paroxysmal nocturnal dyspnea and syncope.  Neurological:  Negative for dizziness.   Objective  Blood pressure (!) 122/59, pulse 62, temperature 98 F (36.7 C), temperature source Temporal, resp. rate 17, height '5\' 4"'$  (1.626 m), weight 102 lb 3.2 oz (46.4 kg), SpO2 98 %.  Vitals with BMI 10/20/2020 10/20/2020 10/06/2020  Height - '5\' 4"'$  '5\' 4"'$   Weight - 102 lbs 3 oz 101 lbs  BMI - [id number] 0000000  Systolic [id number]  Diastolic 59 56 74  Pulse 62 66 58      Physical Exam Vitals reviewed.  Constitutional:      Comments: Frail  HENT:     Head: Normocephalic and atraumatic.  Cardiovascular:     Rate and Rhythm: Normal rate and regular rhythm.     Pulses: Intact distal pulses.          Dorsalis pedis pulses are 0 on the right side and 0 on the left side.       Posterior tibial pulses are 0 on the right side and 0 on the left side.     Heart sounds: S1 normal and S2 normal. No murmur heard.   No gallop.     Comments: No evidence of open wounds or ulcers. Pulmonary:     Effort: Pulmonary effort is normal. No respiratory distress.     Breath sounds: Decreased air movement (bilaterally throughout) present. No wheezing, rhonchi or rales.  Musculoskeletal:     Right lower leg: No edema.     Left lower leg: No edema.  Neurological:     Mental Status: She is alert.    Laboratory examination:   Recent Labs    06/05/20 1935 06/16/20 1241 07/02/20 1152  NA 128* 132* 135  K 3.4* 4.3 4.2  CL 95* 95* 95*  CO2 23 32 34*  GLUCOSE 120* 162* 208*  BUN '20 15 21  '$ CREATININE 0.75 0.72 0.91  CALCIUM 8.1* 8.7 9.3  GFRNONAA >60  --   --    CrCl cannot be calculated (Patient's most recent lab result is older than the maximum 21 days allowed.).  CMP Latest Ref Rng & Units 07/02/2020 06/16/2020 06/05/2020  Glucose 70 - 99 mg/dL 208(H) 162(H)  120(H)  BUN 6 - 23 mg/dL '21 15 20  '$ Creatinine 0.40 - 1.20 mg/dL 0.91 0.72 0.75  Sodium 135 - 145 mEq/L 135 132(L) 128(L)  Potassium 3.5 - 5.1 mEq/L 4.2 4.3 3.4(L)  Chloride 96 - 112 mEq/L 95(L) 95(L) 95(L)  CO2 19 - 32 mEq/L 34(H) 32 23  Calcium 8.4 - 10.5 mg/dL 9.3 8.7 8.1(L)  Total Protein 6.0 -  8.3 g/dL 6.9 6.0 5.7(L)  Total Bilirubin 0.2 - 1.2 mg/dL 0.5 0.7 1.3(H)  Alkaline Phos 39 - 117 U/L 99 167(H) 129(H)  AST 0 - 37 U/L 20 49(H) 105(H)  ALT 0 - 35 U/L 21 68(H) 67(H)   CBC Latest Ref Rng & Units 07/02/2020 06/05/2020 02/05/2020  WBC 4.0 - 10.5 K/uL 7.6 9.4 6.1  Hemoglobin 12.0 - 15.0 g/dL 15.9(H) 15.2(H) 16.4(H)  Hematocrit 36.0 - 46.0 % 46.0 44.2 48.5(H)  Platelets 150.0 - 400.0 K/uL 200.0 123(L) 173.0    Lipid Panel Recent Labs    09/18/20 0918  CHOL 175  TRIG 99.0  LDLCALC 84  VLDL 19.8  HDL 71.20  CHOLHDL 2    HEMOGLOBIN A1C Lab Results  Component Value Date   HGBA1C 5.7 09/18/2020   TSH Recent Labs    06/16/20 1241  TSH 2.82    External labs:   None   Allergies   Allergies  Allergen Reactions   Aspirin Other (See Comments)    REACTION: Uncoated Stomach upset on empty stomach   Codeine Nausea Only and Other (See Comments)    Pill needs to be E.coated   Ibuprofen Nausea And Vomiting   Lipitor [Atorvastatin] Other (See Comments)    myalgia    Medications Prior to Visit:   Outpatient Medications Prior to Visit  Medication Sig Dispense Refill   albuterol (PROAIR HFA) 108 (90 Base) MCG/ACT inhaler Inhale 2 puffs into the lungs every 6 (six) hours as needed for wheezing or shortness of breath. 18 g 5   budesonide-formoterol (SYMBICORT) 160-4.5 MCG/ACT inhaler Inhale 2 puffs into the lungs 2 (two) times daily. Rinse after use 1 each 3   clopidogrel (PLAVIX) 75 MG tablet TAKE 1 TABLET BY MOUTH  DAILY 90 tablet 3   enalapril (VASOTEC) 10 MG tablet TAKE 1 AND 1/2 TABLETS BY  MOUTH DAILY 135 tablet 3   furosemide (LASIX) 20 MG tablet Take 1 tablet  (20 mg total) by mouth daily. 90 tablet 1   hydrochlorothiazide (MICROZIDE) 12.5 MG capsule TAKE 1 CAPSULE BY MOUTH IN  THE MORNING 90 capsule 3   levothyroxine (SYNTHROID) 50 MCG tablet TAKE 1 TABLET BY MOUTH  DAILY EXCEPT 2 TABLETS ON  SUNDAYS AND WEDNESDAYS (  TOTAL 9 TABS PER WEEK) 117 tablet 3   metoprolol tartrate (LOPRESSOR) 100 MG tablet TAKE ONE-HALF TABLET BY  MOUTH IN THE MORNING AND 1  TABLET BY MOUTH IN THE  EVENING 135 tablet 3   pravastatin (PRAVACHOL) 10 MG tablet TAKE 1 TABLET BY MOUTH  DAILY 90 tablet 3   No facility-administered medications prior to visit.   Final Medications at End of Visit    Current Meds  Medication Sig   albuterol (PROAIR HFA) 108 (90 Base) MCG/ACT inhaler Inhale 2 puffs into the lungs every 6 (six) hours as needed for wheezing or shortness of breath.   budesonide-formoterol (SYMBICORT) 160-4.5 MCG/ACT inhaler Inhale 2 puffs into the lungs 2 (two) times daily. Rinse after use   clopidogrel (PLAVIX) 75 MG tablet TAKE 1 TABLET BY MOUTH  DAILY   enalapril (VASOTEC) 10 MG tablet TAKE 1 AND 1/2 TABLETS BY  MOUTH DAILY   furosemide (LASIX) 20 MG tablet Take 1 tablet (20 mg total) by mouth daily.   hydrochlorothiazide (MICROZIDE) 12.5 MG capsule TAKE 1 CAPSULE BY MOUTH IN  THE MORNING   levothyroxine (SYNTHROID) 50 MCG tablet TAKE 1 TABLET BY MOUTH  DAILY EXCEPT 2 TABLETS ON  SUNDAYS AND WEDNESDAYS (  TOTAL 9 TABS PER WEEK)   metoprolol tartrate (LOPRESSOR) 100 MG tablet TAKE ONE-HALF TABLET BY  MOUTH IN THE MORNING AND 1  TABLET BY MOUTH IN THE  EVENING   rosuvastatin (CRESTOR) 20 MG tablet Take 1 tablet (20 mg total) by mouth daily.   [DISCONTINUED] pravastatin (PRAVACHOL) 10 MG tablet TAKE 1 TABLET BY MOUTH  DAILY   Radiology:   No results found.  Cardiac Studies:  Carotid Duplex 04/26/2019:  Right Carotid: Velocities in the right ICA are consistent with a 1-39%  stenosis. Non-hemodynamically significant plaque <50% noted in the  CCA. The ECA appears  <50% stenosed.  Left Carotid: Velocities in the left ICA are consistent with a 40-59%  stenosis. Non-hemodynamically significant plaque <50% noted in the  CCA. The ECA appears >50% stenosed.  Vertebrals:  Bilateral vertebral arteries demonstrate antegrade flow.  Subclavians: Normal flow hemodynamics were seen in bilateral subclavian arteries.   Vascular ultrasound aorta/IVC/iliacs 10/06/2020: IVC/Iliac: Suboptimal exam.  - Greater than 50% stenosis in the right CIA  - 50 - 99% stenosis distal to stent and proximal EIA, limited visualization  ABI 10/06/2020: Right: Resting right ankle-brachial index indicates moderate right lower extremity arterial disease. The right toe-brachial index is abnormal.  Left: Resting left ankle-brachial index indicates moderate left lower extremity arterial disease. The left toe-brachial index is abnormal.  EKG:   10/20/2020: Sinus bradycardia with 2 PACs at a rate of 59 bpm.  Normal axis.  Poor R wave progression, cannot exclude anteroseptal infarct old.  No evidence of ischemia or underlying injury pattern.  Assessment     ICD-10-CM   1. PAD (peripheral artery disease) (HCC)  I73.9 EKG 12-Lead    2. Dyspnea on exertion  R06.00 PCV ECHOCARDIOGRAM COMPLETE    PCV MYOCARDIAL PERFUSION WITH LEXISCAN    3. Bilateral carotid artery stenosis  I65.23     4. Nicotine dependence, cigarettes, uncomplicated  123XX123     5. Hypercholesterolemia  E78.00        Medications Discontinued During This Encounter  Medication Reason   pravastatin (PRAVACHOL) 10 MG tablet Change in therapy    Meds ordered this encounter  Medications   rosuvastatin (CRESTOR) 20 MG tablet    Sig: Take 1 tablet (20 mg total) by mouth daily.    Dispense:  90 tablet    Refill:  3    Recommendations:   SAKINA ORFANOS is a 86 y.o. Caucasian female with history of hypertension, hyperlipidemia, PAD, tobacco use (smokes about half pack per day), patient reported history of CVA (unclear  when), COPD, and hypothyroidism.  Denies history of diabetes, MI. Referred to our office for preoperative risk stratification by Dr. Fuller Song (vascular surgery) as patient has planned upcoming left femoral endarterectomy with retrograde iliac stenting.   Given patient's multiple cardiovascular risk factors as well as symptoms of dyspnea on exertion recommend further cardiac testing prior to pre-operative risk stratification.  We will obtain nuclear stress test, patient is not a treadmill candidate due to significant claudication symptoms.  We will also obtain echocardiogram.   I personally reviewed external labs, patient's LDL is uncontrolled, recommend goal of <70.  Discussed with patient option of discontinuing pravastatin and switching to rosuvastatin 20 mg daily versus adding Zetia, shared decision was to proceed with switching from pravastatin to rosuvastatin.  We will plan to repeat lipid profile testing.  Other changes made to her medications at this time.  Patient likely has chronic diastolic heart failure given history of mildly elevated  BNP multiple times according to record review.  Notably patient lives with her daughter, who accompanies her to today's office visit.  Approximately 5 minutes of today's office visit was spent regarding tobacco cessation counseling patient.  Other recommendations, preoperative risk stratification, and follow-up pending results of cardiac testing.  Patient was seen in collaboration with Dr. Einar Gip. He also reviewed patient's chart and examined the patient. Dr. Einar Gip is in agreement of the plan.     Alethia Berthold, PA-C 10/20/2020, 2:59 PM Office: [phone]

## 2020-10-22 ENCOUNTER — Other Ambulatory Visit: Payer: Self-pay

## 2020-10-22 ENCOUNTER — Encounter: Payer: Self-pay | Admitting: Family Medicine

## 2020-10-22 ENCOUNTER — Ambulatory Visit (INDEPENDENT_AMBULATORY_CARE_PROVIDER_SITE_OTHER): Payer: Medicare Other | Admitting: Family Medicine

## 2020-10-22 VITALS — BP 106/80 | HR 58 | Temp 97.7°F | Ht 64.0 in | Wt 102.0 lb

## 2020-10-22 DIAGNOSIS — M810 Age-related osteoporosis without current pathological fracture: Secondary | ICD-10-CM | POA: Diagnosis not present

## 2020-10-22 DIAGNOSIS — F17200 Nicotine dependence, unspecified, uncomplicated: Secondary | ICD-10-CM | POA: Diagnosis not present

## 2020-10-22 DIAGNOSIS — I739 Peripheral vascular disease, unspecified: Secondary | ICD-10-CM

## 2020-10-22 DIAGNOSIS — Z23 Encounter for immunization: Secondary | ICD-10-CM | POA: Diagnosis not present

## 2020-10-22 DIAGNOSIS — R300 Dysuria: Secondary | ICD-10-CM | POA: Diagnosis not present

## 2020-10-22 LAB — POC URINALSYSI DIPSTICK (AUTOMATED)
Bilirubin, UA: NEGATIVE
Blood, UA: NEGATIVE
Glucose, UA: NEGATIVE
Ketones, UA: NEGATIVE
Leukocytes, UA: NEGATIVE
Nitrite, UA: NEGATIVE
Protein, UA: NEGATIVE
Spec Grav, UA: 1.015 (ref 1.010–1.025)
Urobilinogen, UA: 0.2 E.U./dL
pH, UA: 6.5 (ref 5.0–8.0)

## 2020-10-22 LAB — VITAMIN D 25 HYDROXY (VIT D DEFICIENCY, FRACTURES): VITD: 53.92 ng/mL (ref 30.00–100.00)

## 2020-10-22 MED ORDER — SULFAMETHOXAZOLE-TRIMETHOPRIM 800-160 MG PO TABS: 1.0000 | ORAL_TABLET | Freq: Two times a day (BID) | ORAL | 0 refills | Status: DC

## 2020-10-22 NOTE — Patient Instructions (Addendum)
You can try spraying flonase - 1 spray- on the area where you are going to put a patch.  Let it dry completely then put on the patch.  See if that helps.  Go to the lab on the way out.   If you have mychart we'll likely use that to update you.    Drink plenty of water and start the antibiotics today. +Take care.    ================= Smoking Vitamin D Heart testing Blood vessel procedure.   Then consider a medicine like fosamax.

## 2020-10-22 NOTE — Progress Notes (Signed)
This visit occurred during the SARS-CoV-2 public health emergency.  Safety protocols were in place, including screening questions prior to the visit, additional usage of staff PPE, and extensive cleaning of exam room while observing appropriate contact time as indicated for disinfecting solutions.  She is working to stop smoking with preop eval pending per cardiology.  She has itching from the nicotine patch.  Discussed options.  She may be able to spray Flonase on the area where she is going to use the patch, let it dry, and then apply the patch.  Discussed.  Burning with urination.  Started about 2 weeks ago.  No abd pain.  No VB.  No vomiting.  No fevers.  See plan.  Osteoporosis tx options d/w pt.  D/w pt about DXA results and osteoporosis path/phys in general, including vit D and calcium.  Reasonable to consider treatment with bisphosphonate, ie fosamax.  D/w pt about risk benefit, especially GI sx, jaw and long bone pathology.    Meds, vitals, and allergies reviewed.   ROS: Per HPI unless specifically indicated in ROS section   GEN: nad, alert and oriented HEENT: ncat NECK: supple w/o LA CV: rrr.  PULM: ctab, no inc wob ABD: soft, +bs, not ttp EXT: no edema  31 minutes were devoted to patient care in this encounter (this includes time spent reviewing the patient's file/history, interviewing and examining the patient, counseling/reviewing plan with patient).

## 2020-10-25 ENCOUNTER — Other Ambulatory Visit: Payer: Self-pay | Admitting: Family Medicine

## 2020-10-25 DIAGNOSIS — R300 Dysuria: Secondary | ICD-10-CM | POA: Insufficient documentation

## 2020-10-25 LAB — URINE CULTURE
MICRO NUMBER:: 12321938
SPECIMEN QUALITY:: ADEQUATE

## 2020-10-25 MED ORDER — VITAMIN D3 25 MCG (1000 UT) PO CAPS: 1000.0000 [IU] | ORAL_CAPSULE | Freq: Every day | ORAL | Status: AC

## 2020-10-25 NOTE — Assessment & Plan Note (Signed)
See above.  Discussed smoking cessation.

## 2020-10-25 NOTE — Assessment & Plan Note (Signed)
With preop cardiac evaluation pending.  I will defer.  She agrees.

## 2020-10-25 NOTE — Assessment & Plan Note (Signed)
See above.  We talked about options.  It likely makes sense to address smoking, Vitamin D testing, vascular evaluation and treatment before doing anything else regarding osteoporosis. Later on she can consider a medicine like fosamax.  I would not start it yet.  She agrees with plan.  Check vitamin D today.  See notes on labs.

## 2020-10-25 NOTE — Assessment & Plan Note (Signed)
Presumed cystitis.  Check urine culture.  See notes on labs.  Start Septra.  Okay for outpatient follow-up.

## 2020-10-26 NOTE — Progress Notes (Signed)
[facility] [place]  ASSESSMENT / PLAN: Jill Franco is a 86 y.o. female with atherosclerosis of native and stented aortoiliac occlusive disease as well as bilateral lower extremity arteries causing causing disabling claudication.  Patient counseled patients with asymptomatic peripheral arterial disease or claudication have a 1-2% risk of developing chronic limb threatening ischemia, but a 15-30% risk of mortality in the next 5 years. Intervention should only be considered for medically optimized patients with disabling symptoms. .  Recommend the following which can slow the progression of atherosclerosis and reduce the risk of major adverse cardiac / limb events:  Complete cessation from all tobacco products. Blood glucose control with goal A1c < 7%. Blood pressure control with goal blood pressure < 140/90 mmHg. Lipid reduction therapy with goal LDL-C <100 mg/dL (<70 if symptomatic from PAD).  Aspirin '81mg'$  PO QD.  Atorvastatin 40-'80mg'$  PO QD (or other "high intensity" statin therapy).  Greatly appreciate Dr. Irven Shelling help with preop evaluation. Stress test and echo pending. Will tentatively schedule her for left femoral endarterectomy and stenting in early October pending cardiac workup.    CHIEF COMPLAINT: Calf pain with walking  HISTORY OF PRESENT ILLNESS: Jill Franco is a 86 y.o. female who returns to clinic for surveillance of peripheral arterial disease.  She reports deteriorating claudication symptoms.  She is now disabled by her symptoms.  She can only walk a short distance before severe pain sets in bilateral calves.  She has no wounds about her feet.  She occasionally has pain in her foot at rest, but does not report symptoms typical of ischemic rest pain.  She continues to smoke, but is working hard to quit.  She is using patches with some success.  10/27/20: patient returns after evaluation by Dr. Einar Gip. Pending stress test and echo. No interval  changes.  VASCULAR SURGICAL HISTORY:  left common iliac stent (6 x 40), terminal aortic stent (10 x 19) 03/18/16 ([facility])  VASCULAR RISK FACTORS: Positive history of stroke / transient ischemic attack. Negative history of coronary artery disease.  Negative history of diabetes mellitus. Last A1c 5.7. Positive history of smoking. Not actively smoking. Positive history of hypertension.  No reported history of chronic kidney disease but last GFR 58-73. CKD stage 2 - 3a. Negative history of chronic obstructive pulmonary disease, but patient has been treated with inhalers with symptomatic relief.   FUNCTIONAL STATUS: ECOG performance status: (1) Restricted in physically strenuous activity, ambulatory and able to do work of light nature Ambulatory status: Ambulatory within the community with limits  Past Medical History:  Diagnosis Date   Cataract    bilaterally corrected.   CVA (cerebral vascular accident) ([place])    Pt was unaware and does not know when it happened.  Was told after some testing was done.   Double vision    Facial droop 10/2017   left   History of shingles 2012   HLD (hyperlipidemia)    HTN (hypertension)    Hypothyroidism    PVD (peripheral vascular disease) ([place])    Vision loss    temporary    Past Surgical History:  Procedure Laterality Date   ABDOMINAL EXPLORATION SURGERY  1950s   "they thought I was pregnant; went in to explore"   APPENDECTOMY  childhood   BACK SURGERY     BREAST LUMPECTOMY Left 1960's   benign   carotid ultrasound  06/19/2007   0-39% stenosis   CATARACT EXTRACTION W/ INTRAOCULAR LENS IMPLANT Left 09/2016   CATARACT  EXTRACTION W/ INTRAOCULAR LENS IMPLANT Right 10/2016   COLONOSCOPY WITH PROPOFOL N/A 04/08/2019   Procedure: COLONOSCOPY WITH PROPOFOL;  Surgeon: Milus Banister, MD;  Location: [facility];  Service: Endoscopy;  Laterality: N/A;   DILATION AND CURETTAGE OF UTERUS  1960's X 3   miscarriages   DOPPLER ECHOCARDIOGRAPHY   06/19/2007   Normal EF 55-70%   ILIAC ARTERY STENT Left 03/18/2016   common iliac stent (6 x 40), aortic stent (10 x 19)/notes 03/18/2016   LAMINECTOMY AND MICRODISCECTOMY SPINE  1960's X 2   MOLE REMOVAL  07/10/2016   NSVD     x1   PERIPHERAL VASCULAR CATHETERIZATION N/A 03/18/2016   Procedure: Abdominal Aortogram w/Lower Extremity;  Surgeon: Elam Dutch, MD;  Location: [facility];  Service: Cardiovascular;  Laterality: N/A;   PERIPHERAL VASCULAR CATHETERIZATION  03/18/2016   Procedure: Peripheral Vascular Intervention;  Surgeon: Elam Dutch, MD;  Location: [facility];  Service: Cardiovascular;;   POLYPECTOMY  04/08/2019   Procedure: POLYPECTOMY;  Surgeon: Milus Banister, MD;  Location: [facility];  Service: Endoscopy;;    Family History  Problem Relation Age of Onset   Stroke Father 38   Cancer Brother    Colon cancer Neg Hx    Breast cancer Neg Hx    Colon polyps Neg Hx    Esophageal cancer Neg Hx    Rectal cancer Neg Hx    Stomach cancer Neg Hx     Social History   Socioeconomic History   Marital status: Widowed    Spouse name: Not on file   Number of children: 1   Years of education: Not on file   Highest education level: Not on file  Occupational History   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    Comment: Glass blower/designer  Tobacco Use   Smoking status: Every Day    Packs/day: 0.25    Years: 69.00    Pack years: 17.25    Types: Cigarettes    Last attempt to quit: 04/21/2019    Years since quitting: 1.5   Smokeless tobacco: Never  Vaping Use   Vaping Use: Never used  Substance and Sexual Activity   Alcohol use: No    Alcohol/week: 0.0 standard drinks   Drug use: No   Sexual activity: Not on file  Other Topics Concern   Not on file  Social History Narrative   From [place]   Retired 2007   Widowed 2008 after 58 years   Enjoys yard work, but needs some help with yard work now.     Her daughter is helping at home some (some as of 2022).   Her daughter has vision loss and is widowed as of 2022 and moved in with patient.   Social Determinants of Health   Financial Resource Strain: Not on file  Food Insecurity: Not on file  Transportation Needs: Not on file  Physical Activity: Not on file  Stress: Not on file  Social Connections: Not on file  Intimate Partner Violence: Not on file    Allergies  Allergen Reactions   Aspirin Other (See Comments)    REACTION: Uncoated Stomach upset on empty stomach   Codeine Nausea Only and Other (See Comments)    Pill needs to be E.coated   Ibuprofen Nausea And Vomiting   Lipitor [Atorvastatin] Other (See Comments)    myalgia    Current Outpatient Medications  Medication Sig Dispense Refill   albuterol (PROAIR HFA) 108 (90 Base) MCG/ACT inhaler  Inhale 2 puffs into the lungs every 6 (six) hours as needed for wheezing or shortness of breath. 18 g 5   budesonide-formoterol (SYMBICORT) 160-4.5 MCG/ACT inhaler Inhale 2 puffs into the lungs 2 (two) times daily. Rinse after use 1 each 3   Cholecalciferol (VITAMIN D3) 25 MCG (1000 UT) CAPS Take 1 capsule (1,000 Units total) by mouth daily.     clopidogrel (PLAVIX) 75 MG tablet TAKE 1 TABLET BY MOUTH  DAILY 90 tablet 3   enalapril (VASOTEC) 10 MG tablet TAKE 1 AND 1/2 TABLETS BY  MOUTH DAILY 135 tablet 3   furosemide (LASIX) 20 MG tablet Take 1 tablet (20 mg total) by mouth daily. 90 tablet 1   hydrochlorothiazide (MICROZIDE) 12.5 MG capsule TAKE 1 CAPSULE BY MOUTH IN  THE MORNING 90 capsule 3   levothyroxine (SYNTHROID) 50 MCG tablet TAKE 1 TABLET BY MOUTH  DAILY EXCEPT 2 TABLETS ON  SUNDAYS AND WEDNESDAYS (  TOTAL 9 TABS PER WEEK) 117 tablet 3   metoprolol tartrate (LOPRESSOR) 100 MG tablet TAKE ONE-HALF TABLET BY  MOUTH IN THE MORNING AND 1  TABLET BY MOUTH IN THE  EVENING 135 tablet 3   rosuvastatin (CRESTOR) 20 MG tablet Take 1 tablet (20 mg total) by mouth daily. 90 tablet 3   sulfamethoxazole-trimethoprim (BACTRIM DS) 800-160 MG tablet  Take 1 tablet by mouth 2 (two) times daily. 6 tablet 0   No current facility-administered medications for this visit.    REVIEW OF SYSTEMS:  '[X]'$  denotes positive finding, '[ ]'$  denotes negative finding Cardiac  Comments:  Chest pain or chest pressure:    Shortness of breath upon exertion:    Short of breath when lying flat:    Irregular heart rhythm:        Vascular    Pain in calf, thigh, or hip brought on by ambulation:    Pain in feet at night that wakes you up from your sleep:     Blood clot in your veins:    Leg swelling:         Pulmonary    Oxygen at home:    Productive cough:     Wheezing:         Neurologic    Sudden weakness in arms or legs:     Sudden numbness in arms or legs:     Sudden onset of difficulty speaking or slurred speech:    Temporary loss of vision in one eye:     Problems with dizziness:         Gastrointestinal    Blood in stool:     Vomited blood:         Genitourinary    Burning when urinating:     Blood in urine:        Psychiatric    Major depression:         Hematologic    Bleeding problems:    Problems with blood clotting too easily:        Skin    Rashes or ulcers:        Constitutional    Fever or chills:      PHYSICAL EXAM Vitals:   10/27/20 1400  BP: (!) 142/79  Pulse: 67  Resp: 20  Temp: 98.1 F (36.7 C)  SpO2: 100%  Weight: 102 lb (46.3 kg)  Height: '5\' 4"'$  (1.626 m)     Constitutional: well appearing. no distress. Thin. Appears marginally nourished.  Neurologic: CN intact. no focal findings.  no sensory loss. Psychiatric:  Mood and affect symmetric and appropriate. Eyes:  No icterus. No conjunctival pallor. Ears, nose, throat:  mucous membranes moist. Midline trachea.  Cardiac: regular rate and rhythm.  Respiratory:  unlabored. Abdominal:  soft, non-tender, non-distended.  Peripheral vascular: absent left femoral pulse Extremity: no edema. no cyanosis. no pallor.  Skin: no gangrene. no ulceration. Atrophic  skin. Lymphatic: no Stemmer's sign. no palpable lymphadenopathy.  PERTINENT LABORATORY AND RADIOLOGIC DATA  Most recent CBC CBC Latest Ref Rng & Units 07/02/2020 06/05/2020 02/05/2020  WBC 4.0 - 10.5 K/uL 7.6 9.4 6.1  Hemoglobin 12.0 - 15.0 g/dL 15.9(H) 15.2(H) 16.4(H)  Hematocrit 36.0 - 46.0 % 46.0 44.2 48.5(H)  Platelets 150.0 - 400.0 K/uL 200.0 123(L) 173.0     Most recent CMP CMP Latest Ref Rng & Units 07/02/2020 06/16/2020 06/05/2020  Glucose 70 - 99 mg/dL 208(H) 162(H) 120(H)  BUN 6 - 23 mg/dL '21 15 20  '$ Creatinine 0.40 - 1.20 mg/dL 0.91 0.72 0.75  Sodium 135 - 145 mEq/L 135 132(L) 128(L)  Potassium 3.5 - 5.1 mEq/L 4.2 4.3 3.4(L)  Chloride 96 - 112 mEq/L 95(L) 95(L) 95(L)  CO2 19 - 32 mEq/L 34(H) 32 23  Calcium 8.4 - 10.5 mg/dL 9.3 8.7 8.1(L)  Total Protein 6.0 - 8.3 g/dL 6.9 6.0 5.7(L)  Total Bilirubin 0.2 - 1.2 mg/dL 0.5 0.7 1.3(H)  Alkaline Phos 39 - 117 U/L 99 167(H) 129(H)  AST 0 - 37 U/L 20 49(H) 105(H)  ALT 0 - 35 U/L 21 68(H) 67(H)    Renal function CrCl cannot be calculated (Patient's most recent lab result is older than the maximum 21 days allowed.).  Hgb A1c MFr Bld (%)  Date Value  09/18/2020 5.7    LDL Cholesterol  Date Value Ref Range Status  09/18/2020 84 0 - 99 mg/dL Final   Direct LDL  Date Value Ref Range Status  06/19/2012 115.8 mg/dL Final    Comment:    Optimal:  <100 mg/dLNear or Above Optimal:  100-129 mg/dLBorderline High:  130-159 mg/dLHigh:  160-189 mg/dLVery High:  >190 mg/dL     CLINICAL DATA:  86 year old female with history of aorto bi-iliac occlusive disease status post distal aortic and left common iliac stent placement in 2018.   EXAM: CT ANGIOGRAPHY OF ABDOMINAL AORTA WITH ILIOFEMORAL RUNOFF   TECHNIQUE: Multidetector CT imaging of the abdomen, pelvis and lower extremities was performed using the standard protocol during bolus administration of intravenous contrast. Multiplanar CT image reconstructions and MIPs were  obtained to evaluate the vascular anatomy.   CONTRAST:  165m ISOVUE-370 IOPAMIDOL (ISOVUE-370) INJECTION 76%   COMPARISON:  04/05/2019   FINDINGS: VASCULAR   Aorta: Patent and normal caliber throughout. Circumferential fibrofatty and calcific atherosclerotic calcifications, most prominent distally. Patent indwelling distal infrarenal aortic stent.   Celiac: Severe ostial stenosis secondary to atherosclerotic plaque. Mild poststenotic ectasia. Patent distally.   SMA: Patent ostium with an approximately 7 cm segment of prominent atherosclerotic plaque resulting in multifocal mild to severe stenoses. Patent distal jejunal and colonic branches.   Renals: Single bilateral renal arteries are patent. At least mild proximal left renal artery stenosis secondary to atherosclerotic plaque.   IMA: Ostial occlusion secondary to atherosclerotic plaque. Distal reconstitution.   RIGHT Lower Extremity   Inflow: Circumferential fibrofatty and calcific atherosclerotic plaque about the common iliac artery with at least moderate focal stenosis in its midportion. The internal iliac artery is heavily calcified but appears patent. Diminutive but patent external iliac artery with scattered  atherosclerotic calcifications, likely multiple at least mild stenoses.   Outflow: Common femoral artery and profunda are patent. The superficial femoral artery is patent in its proximal 4 cm followed by chronic total occlusion to the level of Hunter's canal. There is distal reconstitution of the patent popliteal artery.   Runoff: Patent three vessel runoff to the ankle.   LEFT Lower Extremity   Inflow: The common iliac artery is patent with patent indwelling stent in unchanged position. Circumferential atherosclerotic calcifications. Severe ostial stenosis of the internal iliac artery which is patent distally. Severe focal stenosis about the proximal external iliac artery which is patent but diminutive  distally with circumferential atherosclerotic calcifications.   Outflow: Common femoral artery is patent with multifocal at least mild stenosis secondary to atherosclerotic plaque. The profundus patent. Diffuse calcifications throughout the superficial femoral artery resulting in at least mild multifocal stenoses to the level of Hunter's canal. The popliteal artery is patent.   Runoff: Patent three vessel runoff to the ankle.   Veins: No obvious venous abnormality within the limitations of this arterial phase study.   Review of the MIP images confirms the above findings.   NON-VASCULAR   Lower chest: Unchanged left lower lobe solid pulmonary nodule measuring up to 7 mm. Similar appearing right middle and lingular subpleural scarring. Mild global cardiomegaly. No pericardial effusion.   Hepatobiliary: No focal liver abnormality is seen. No gallstones, gallbladder wall thickening, or biliary dilatation.   Pancreas: Unremarkable. No pancreatic ductal dilatation or surrounding inflammatory changes.   Spleen: Normal in size without focal abnormality.   Adrenals/Urinary Tract: Similar appearing diffuse thickening of the left adrenal gland. The right adrenal gland is within normal limits. Kidneys are normal, without renal calculi, focal lesion, or hydronephrosis. Bladder is unremarkable.   Stomach/Bowel: Stomach is within normal limits. Appendix is not definitively visualized. No evidence of bowel wall thickening, distention, or inflammatory changes.   Lymphatic: No abdominopelvic lymphadenopathy.   Reproductive: Uterus and bilateral adnexa are unremarkable.   Other: No abdominal wall hernia or abnormality. No abdominopelvic ascites.   Musculoskeletal: Multilevel discogenic degenerative changes of the lumbar spine, most pronounced at L4-L5 and L5-S1. Osteopenia. No acute osseous abnormality.   IMPRESSION: VASCULAR   1. Patent indwelling distal aortic and left common  iliac artery stents. Aortic Atherosclerosis (ICD10-I70.0). 2. 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 3. Atherosclerotic plaque about the bifurcation of the distal left common iliac artery resulting in severe stenosis of the proximal left internal and external iliac arteries, each patent distally but diminutive with circumferential atherosclerotic calcifications. 4. High degree of atherosclerotic burden and likely at least mild multifocal stenoses of the left lower extremity outflow vessels. 5. Patent 3 vessel bilateral lower extremity runoff.   NON-VASCULAR   No acute abdominopelvic abnormality.   Ruthann Cancer, MD   [facility]   [facility]     Electronically Signed   By: Ruthann Cancer MD   On: 06/29/2020 13:34  Vascular Imaging: LOWER EXTREMITY DOPPLER STUDY   Patient Name:  Jill Franco  Date of Exam:   10/06/2020  Medical Rec #: [id number]         Accession #:    [id number]  Date of Birth: [birth year]         Patient Gender: F  Patient Age:   31 years  Exam Location:  [facility]  Procedure:  VAS [place] ABI WITH/WO TBI  Referring Phys: CHARLES FIELDS    ---------------------------------------------------------------------------  -----     Indications: Claudication, and peripheral artery disease.   High Risk Factors: Hypertension, hyperlipidemia, past history of smoking.   Other Factors: Patient complains of constant lower extremity pain, appears  to be                 getting worse   Vascular Interventions: Left CIA and aortic stent 03/18/2016.   Comparison Study: Increase in toe-brachial indeces since prior exam of  4/282022   Performing Technologist: Alvia Grove RVT      Examination Guidelines: A complete evaluation includes at minimum, Doppler  waveform signals and systolic blood pressure  reading at the level of  bilateral  brachial, anterior tibial, and posterior tibial arteries, when vessel  segments  are accessible. Bilateral testing is considered an integral part of a  complete  examination. Photoelectric Plethysmograph (PPG) waveforms and toe systolic  pressure readings are included as required and additional duplex testing  as  needed. Limited examinations for reoccurring indications may be performed  as  noted.      ABI Findings:  +---------+------------------+-----+----------+--------+  Right    Rt Pressure (mmHg)IndexWaveform  Comment   +---------+------------------+-----+----------+--------+  Brachial 162                                        +---------+------------------+-----+----------+--------+  PTA      105               0.64 monophasic          +---------+------------------+-----+----------+--------+  DP       98                0.59 monophasic          +---------+------------------+-----+----------+--------+  Great Toe64                0.39 Abnormal            +---------+------------------+-----+----------+--------+   +---------+------------------+-----+----------+-------+  Left     Lt Pressure (mmHg)IndexWaveform  Comment  +---------+------------------+-----+----------+-------+  Brachial 165                                       +---------+------------------+-----+----------+-------+  PTA      89                0.54 monophasic         +---------+------------------+-----+----------+-------+  DP       70                0.42 monophasic         +---------+------------------+-----+----------+-------+  Great Toe42                0.25 Abnormal           +---------+------------------+-----+----------+-------+   +-------+-----------+-----------+------------+------------+  ABI/TBIToday's ABIToday's TBIPrevious ABIPrevious TBI  +-------+-----------+-----------+------------+------------+   Right  0.64       0.39       0.58        absent        +-------+-----------+-----------+------------+------------+  Left   0.54       0.25       0.68        absent        +-------+-----------+-----------+------------+------------+  Summary:  Right: Resting right ankle-brachial index indicates moderate right lower  extremity arterial disease. The right toe-brachial index is abnormal.   Left: Resting left ankle-brachial index indicates moderate left lower  extremity arterial disease. The left toe-brachial index is abnormal.       *See table(s) above for measurements and observations.       Electronically signed by Jamelle Haring on 10/06/2020 at 10:11:55 AM.   ABDOMINAL AORTA STUDY   Patient Name:  Jill Franco  Date of Exam:   10/06/2020  Medical Rec #: [id number]         Accession #:    [id number]  Date of Birth: Apr 01, 1934         Patient Gender: F  Patient Age:   19 years  Exam Location:  [facility]  Procedure:      VAS US AORTA/IVC/ILIACS  Referring Phys: Juanda Crumble FIELDS    ---------------------------------------------------------------------------  -----     Risk Factors: Hypertension, hyperlipidemia, past history of smoking.   Vascular Interventions: Left CIA and aortic stent 03/18/2016.   Limitations: Air/bowel gas. Calcific plaque noted throughout.      Performing Technologist: Alvia Grove RVT      Examination Guidelines: A complete evaluation includes B-mode imaging,  spectral  Doppler, color Doppler, and power Doppler as needed of all accessible  portions  of each vessel. Bilateral testing is considered an integral part of a  complete  examination. Limited examinations for reoccurring indications may be  performed  as noted.      Abdominal Aorta Findings:  +-------------+-------+----------+----------+----------+--------+--------+  Location     AP (cm)Trans (cm)PSV (cm/s)Waveform  ThrombusComments   +-------------+-------+----------+----------+----------+--------+--------+  Proximal                      47                                    +-------------+-------+----------+----------+----------+--------+--------+  Mid                           38                                    +-------------+-------+----------+----------+----------+--------+--------+  Distal                        120                                   +-------------+-------+----------+----------+----------+--------+--------+  RT CIA Prox                   117       monophasic                  +-------------+-------+----------+----------+----------+--------+--------+  RT CIA Mid                    280       monophasic                  +-------------+-------+----------+----------+----------+--------+--------+  RT CIA Distal                 128       monophasic                  +-------------+-------+----------+----------+----------+--------+--------+  RT EIA Prox                                               NV        +-------------+-------+----------+----------+----------+--------+--------+  RT EIA Mid                    137       monophasic                  +-------------+-------+----------+----------+----------+--------+--------+  RT EIA Distal                 125       monophasic                  +-------------+-------+----------+----------+----------+--------+--------+  LT CIA Prox                                               stent     +-------------+-------+----------+----------+----------+--------+--------+  LT CIA Mid                                                stent     +-------------+-------+----------+----------+----------+--------+--------+  LT CIA Distal                                             stent     +-------------+-------+----------+----------+----------+--------+--------+  LT EIA Prox                    459       monophasic                  +-------------+-------+----------+----------+----------+--------+--------+  LT EIA Mid                    66        monophasic                  +-------------+-------+----------+----------+----------+--------+--------+  LT EIA Distal                 85        monophasic                  +-------------+-------+----------+----------+----------+--------+--------+   Right CFA 47 cm.s monophasic waveform. Left CFA 41 cm/s monophasic  waveform.   Left Stent(s):  +---------------+--------+---------------+----------+---------------------+   Left CIA stent PSV cm/sStenosis       Waveform  Comments                +---------------+--------+---------------+----------+---------------------+   Prox to Stent  149                    monophasic                        +---------------+--------+---------------+----------+---------------------+   Proximal Stent 113                    monophasic                        +---------------+--------+---------------+----------+---------------------+  Mid Stent      130                    monophasic                        +---------------+--------+---------------+----------+---------------------+   Distal Stent   144                    monophasiclimited  visualization  +---------------+--------+---------------+----------+---------------------+   Distal to Stent599     50-99% stenosisstenotic  limited  visualization  +---------------+--------+---------------+----------+---------------------+                   Summary:  IVC/Iliac: Suboptimal exam.   - Greater than 50% stenosis in the right CIA  - 50 - 99% stenosis distal to stent and proximal EIA, limited  visualization    NOTE: Further imaging may be warranted.     *See table(s) above for measurements and observations.     Electronically signed by Jamelle Haring on 10/06/2020 at  10:11:37 AM.   Yevonne Aline. Stanford Breed, MD [facility] Phone Number: [phone] 10/27/2020 2:25 PM

## 2020-10-27 ENCOUNTER — Other Ambulatory Visit: Payer: Self-pay

## 2020-10-27 ENCOUNTER — Ambulatory Visit: Payer: Medicare Other | Admitting: Vascular Surgery

## 2020-10-27 ENCOUNTER — Encounter: Payer: Self-pay | Admitting: Vascular Surgery

## 2020-10-27 VITALS — BP 142/79 | HR 67 | Temp 98.1°F | Resp 20 | Ht 64.0 in | Wt 102.0 lb

## 2020-10-27 DIAGNOSIS — I739 Peripheral vascular disease, unspecified: Secondary | ICD-10-CM | POA: Diagnosis not present

## 2020-11-04 ENCOUNTER — Telehealth: Payer: Self-pay | Admitting: Family Medicine

## 2020-11-04 NOTE — Chronic Care Management (AMB) (Signed)
  Chronic Care Management   Note  11/04/2020 Name: Jill Franco MRN: [id number] DOB: [birth year]  Jill Franco is a 86 y.o. year old female who is a primary care patient of Tonia Ghent, MD. I reached out to [name] by phone Franco in response to a referral sent by Jill Franco's PCP, Tonia Ghent, MD.   Jill Franco including:  CCM service includes personalized support from designated clinical staff supervised by her physician, including individualized plan of care and coordination with other care providers 24/7 contact phone numbers for assistance for urgent and routine care needs. Service will only be billed when office clinical staff spend 20 minutes or more in a month to coordinate care. Only one practitioner may furnish and bill the service in a calendar month. The patient may stop CCM services at any time (effective at the end of the month) by phone call to the office staff.   Patient wishes to consider information provided and/or speak with a member of the care team before deciding about enrollment in care management services.   Follow up plan:   [name]

## 2020-11-18 ENCOUNTER — Other Ambulatory Visit: Payer: Self-pay

## 2020-11-18 ENCOUNTER — Ambulatory Visit: Payer: Medicare Other

## 2020-11-18 DIAGNOSIS — R0600 Dyspnea, unspecified: Secondary | ICD-10-CM

## 2020-11-18 DIAGNOSIS — R0609 Other forms of dyspnea: Secondary | ICD-10-CM | POA: Diagnosis not present

## 2020-11-19 ENCOUNTER — Telehealth: Payer: Self-pay

## 2020-11-19 ENCOUNTER — Other Ambulatory Visit: Payer: Self-pay | Admitting: Family Medicine

## 2020-11-19 MED ORDER — METHOCARBAMOL 500 MG PO TABS: 500.0000 mg | ORAL_TABLET | Freq: Three times a day (TID) | ORAL | 1 refills | Status: DC | PRN

## 2020-11-19 NOTE — Telephone Encounter (Signed)
She can try robaxin, rx sent. But if this if from claudication/PAD (if it clearly gets worse with walking) then she needs to see eval/get checked.  Thanks.

## 2020-11-19 NOTE — Telephone Encounter (Signed)
[facility] - Client TELEPHONE ADVICE RECORD [facility] Patient Name: Jill Franco FOOT Gender: Female DOB: 03-21-1934 Age: 86 Y 76 M 28 D Return Phone Number: [phone] (Primary) Address: City/ State/ Zip: [place] [address] Client [facility] - Client Client Site [facility] Physician Renford Dills - MD Contact Type Call Who Is Calling Patient / Member / Family / Caregiver Call Type Triage / Clinical Relationship To Patient Self Return Phone Number [phone] (Primary) Chief Complaint Leg Pain Reason for Call Symptomatic / Request for Jill Franco states she is having some cramping in her hands, fingers, legs and toes. The office wants her triaged. Translation No Nurse Assessment Nurse: Genelle Gather, RN, Magda Paganini Date/Time (Eastern Time): 11/18/2020 4:46:21 PM Confirm and document reason for call. If symptomatic, describe symptoms. ---Caller states she is having some cramping in her hands, fingers, legs and toes. She states the last three days have been worse. No tingling. Have not taken otc meds. Does the patient have any new or worsening symptoms? ---Yes Will a triage be completed? ---Yes Related visit to physician within the last 2 weeks? ---No Does the PT have any chronic conditions? (i.e. diabetes, asthma, this includes High risk factors for pregnancy, etc.) ---Yes List chronic conditions. ---Thyroid, High Cholesterol, takes a blood thinner Is this a behavioral health or substance abuse call? ---No Guidelines Guideline Title Affirmed Question Affirmed Notes Nurse Date/Time (Eastern Time) Finger Pain [1] MODERATE pain (e.g., interferes with normal activities) AND [2] present > 3 days Huffine, RN, Magda Paganini 11/18/2020 4:50:29 PM Disp. Time Eilene Ghazi Time) Disposition Final User 11/18/2020 4:54:50 PM See PCP within 24 Hours Yes Huffine, RN,  Magda Paganini PLEASE NOTE: All timestamps contained within this report are represented as [place] Standard Time. CONFIDENTIALTY NOTICE: This fax transmission is intended only for the addressee. It contains information that is legally privileged, confidential or otherwise protected from use or disclosure. If you are not the intended recipient, you are strictly prohibited from reviewing, disclosing, copying using or disseminating any of this information or taking any action in reliance on or regarding this information. If you have received this fax in error, please notify us immediately by telephone so that we can arrange for its return to [place]. Phone: [phone], Toll-Free: [phone], Fax: [phone] Page: 2 of 2 Call Id: [id number] Disposition Overriden: SEE PCP WITHIN 3 DAYS Override Reason: Patient's symptoms need a higher level of care Caller Disagree/Comply Disagree Caller Understands Yes PreDisposition Call Doctor Care Advice Given Per Guideline SEE PCP WITHIN 24 HOURS: * IF OFFICE WILL BE OPEN: You need to be examined within the next 24 hours. Call your doctor (or NP/PA) when the office opens and make an appointment. * ACETAMINOPHEN - REGULAR STRENGTH TYLENOL: Take 650 mg (two 325 mg pills) by mouth every 4 to 6 hours as needed. Each Regular Strength Tylenol pill has 325 mg of acetaminophen. The most you should take is 10 pills a day (3,250 mg total). Note: In [place], the maximum is 12 pills a day (3,900 mg total). PAIN MEDICINES: CALL BACK IF: * You become worse Comments User: Shelly Coss, RN Date/Time Eilene Ghazi Time): 11/18/2020 4:54:47 PM Pt states she has too many appointments scheduled due to her upcoming hospitalization that she cannot go to PCP now. Referrals GO TO FACILITY REFUSED

## 2020-11-19 NOTE — Telephone Encounter (Signed)
Pt said she has so many appts she does not want to schedule an appt; pt also has trouble getting transportation. Pt is requesting a med sent to [facility] for the cramping/ pt has had cramping on and off for a "long time". Pt said last 2 - 3 days cramping has been worse. Pt said has cramping whether sitting, laying or walking; no reason for pain it "just happens". Pt said tylenol might help a little bit but last night the cramping in back of rt lower leg and toes were the worst it has been. Pt request cb after Dr Damita Dunnings reviews note.

## 2020-11-20 NOTE — Telephone Encounter (Signed)
Patient notified rx was sent. Advised if this does not get better to call for appt/eval.

## 2020-11-22 ENCOUNTER — Telehealth: Payer: Self-pay | Admitting: Family Medicine

## 2020-11-22 DIAGNOSIS — R911 Solitary pulmonary nodule: Secondary | ICD-10-CM

## 2020-11-22 NOTE — Telephone Encounter (Signed)
Patient is due for follow-up lung CT regarding pulmonary nodule seen on previous imaging.  I put in the order.  Please call her so she will expect scheduling notification.  Thanks.

## 2020-11-23 ENCOUNTER — Other Ambulatory Visit: Payer: Medicare Other

## 2020-11-23 NOTE — Progress Notes (Signed)
Seeing you 10/11

## 2020-11-24 NOTE — Progress Notes (Addendum)
Surgical Instructions    Your procedure is scheduled on Thursday, October 13th.  Report to [facility] Main Entrance "A" at 5:30 A.M., then check in with the Admitting office.  Call this number if you have problems the morning of surgery:  [phone]   If you have any questions prior to your surgery date call [phone]: Open Monday-Friday 8am-4pm    Remember:  Do not eat or drink after midnight the night before your surgery      Take these medicines the morning of surgery with A SIP OF WATER   Levothyroxine (Synthroid)  Methocarbamol (Robaxin)  Metoprolol Tartrate (Lopressor)  Rosuvastatin (Crestor)   If needed:  Albuterol Inhaler (bring with you on day of surgery)  Symbicort Inhaler  Follow your surgeon's instructions on when to stop Plavix.  If no instructions were given by your surgeon then you will need to call the office to get those instructions.    As of today, STOP taking any Aspirin (unless otherwise instructed by your surgeon) Aleve, Naproxen, Ibuprofen, Motrin, Advil, Goody's, BC's, all herbal medications, fish oil, and all vitamins.          Do not wear jewelry, makeup, or nail polish Do not wear lotions, powders, perfumes, or deodorant. Do not shave 48 hours prior to surgery.   Do not bring valuables to the hospital.             [facility] is not responsible for any belongings or valuables.  Do NOT Smoke (Tobacco/Vaping)  24 hours prior to your procedure If you use a CPAP at night, you may bring your mask for your overnight stay.   Contacts, glasses, dentures or bridgework may not be worn into surgery, please bring cases for these belongings   For patients admitted to the hospital, discharge time will be determined by your treatment team.   Patients discharged the day of surgery will not be allowed to drive home, and someone needs to stay with them for 24 hours.  NO VISITORS WILL BE ALLOWED IN PRE-OP WHERE PATIENTS ARE PREPPED FOR SURGERY.  ONLY 1  SUPPORT PERSON MAY BE PRESENT IN THE WAITING ROOM WHILE YOU ARE IN SURGERY.  IF YOU ARE TO BE ADMITTED, ONCE YOU ARE IN YOUR ROOM YOU WILL BE ALLOWED TWO (2) VISITORS. 1 (ONE) VISITOR MAY STAY OVERNIGHT BUT MUST ARRIVE TO THE ROOM BY 8pm.  Minor children may have two parents present. Special consideration for safety and communication needs will be reviewed on a case by case basis.  Special instructions:    Oral Hygiene is also important to reduce your risk of infection.  Remember - BRUSH YOUR TEETH THE MORNING OF SURGERY WITH YOUR REGULAR TOOTHPASTE   [place]- Preparing For Surgery  Before surgery, you can play an important role. Because skin is not sterile, your skin needs to be as free of germs as possible. You can reduce the number of germs on your skin by washing with CHG (chlorahexidine gluconate) Soap before surgery.  CHG is an antiseptic cleaner which kills germs and bonds with the skin to continue killing germs even after washing.     Please do not use if you have an allergy to CHG or antibacterial soaps. If your skin becomes reddened/irritated stop using the CHG.  Do not shave (including legs and underarms) for at least 48 hours prior to first CHG shower. It is OK to shave your face.  Please follow these instructions carefully.     Shower the [employer]  BEFORE SURGERY and the MORNING OF SURGERY with CHG Soap.   If you chose to wash your hair, wash your hair first as usual with your normal shampoo. After you shampoo, rinse your hair and body thoroughly to remove the shampoo.  Then [employer] and genitals (private parts) with your normal soap and rinse thoroughly to remove soap.  After that Use CHG Soap as you would any other liquid soap. You can apply CHG directly to the skin and wash gently with a scrungie or a clean washcloth.   Apply the CHG Soap to your body ONLY FROM THE NECK DOWN.  Do not use on open wounds or open sores. Avoid contact with your eyes, ears, mouth and genitals  (private parts). Wash Face and genitals (private parts)  with your normal soap.   Wash thoroughly, paying special attention to the area where your surgery will be performed.  Thoroughly rinse your body with warm water from the neck down.  DO NOT shower/wash with your normal soap after using and rinsing off the CHG Soap.  Pat yourself dry with a CLEAN TOWEL.  Wear CLEAN PAJAMAS to bed the night before surgery  Place CLEAN SHEETS on your bed the night before your surgery  DO NOT SLEEP WITH PETS.   Day of Surgery:  Take a shower with CHG soap. Wear Clean/Comfortable clothing the morning of surgery Do not apply any deodorants/lotions.   Remember to brush your teeth WITH YOUR REGULAR TOOTHPASTE.   Please read over the following fact sheets that you were given.

## 2020-11-24 NOTE — Telephone Encounter (Signed)
Patient notified and is scheduled for CT on 12/08/20 at 10:15 am.

## 2020-11-25 ENCOUNTER — Other Ambulatory Visit: Payer: Self-pay

## 2020-11-25 ENCOUNTER — Ambulatory Visit: Payer: Medicare Other

## 2020-11-25 ENCOUNTER — Encounter (HOSPITAL_COMMUNITY): Payer: Self-pay

## 2020-11-25 ENCOUNTER — Encounter (HOSPITAL_COMMUNITY)
Admission: RE | Admit: 2020-11-25 | Discharge: 2020-11-25 | Disposition: A | Discharge status: Home / Self Care | Payer: Medicare Other | Source: Ambulatory Visit | Attending: Vascular Surgery | Admitting: Vascular Surgery

## 2020-11-25 ENCOUNTER — Ambulatory Visit: Payer: Medicare Other | Admitting: Cardiology

## 2020-11-25 DIAGNOSIS — R0609 Other forms of dyspnea: Secondary | ICD-10-CM | POA: Diagnosis not present

## 2020-11-25 DIAGNOSIS — Z7902 Long term (current) use of antithrombotics/antiplatelets: Secondary | ICD-10-CM | POA: Diagnosis not present

## 2020-11-25 DIAGNOSIS — Z79899 Other long term (current) drug therapy: Secondary | ICD-10-CM | POA: Insufficient documentation

## 2020-11-25 DIAGNOSIS — F17200 Nicotine dependence, unspecified, uncomplicated: Secondary | ICD-10-CM | POA: Diagnosis not present

## 2020-11-25 DIAGNOSIS — Z79890 Hormone replacement therapy (postmenopausal): Secondary | ICD-10-CM | POA: Diagnosis not present

## 2020-11-25 DIAGNOSIS — I739 Peripheral vascular disease, unspecified: Secondary | ICD-10-CM | POA: Insufficient documentation

## 2020-11-25 DIAGNOSIS — I10 Essential (primary) hypertension: Secondary | ICD-10-CM | POA: Insufficient documentation

## 2020-11-25 DIAGNOSIS — Z7901 Long term (current) use of anticoagulants: Secondary | ICD-10-CM | POA: Diagnosis not present

## 2020-11-25 DIAGNOSIS — Z01812 Encounter for preprocedural laboratory examination: Secondary | ICD-10-CM | POA: Insufficient documentation

## 2020-11-25 DIAGNOSIS — Z8673 Personal history of transient ischemic attack (TIA), and cerebral infarction without residual deficits: Secondary | ICD-10-CM | POA: Diagnosis not present

## 2020-11-25 DIAGNOSIS — J449 Chronic obstructive pulmonary disease, unspecified: Secondary | ICD-10-CM | POA: Diagnosis not present

## 2020-11-25 HISTORY — DX: Chronic obstructive pulmonary disease, unspecified: J44.9

## 2020-11-25 HISTORY — DX: Dyspnea, unspecified: R06.00

## 2020-11-25 LAB — PROTIME-INR
INR: 1 (ref 0.8–1.2)
Prothrombin Time: 13.2 seconds (ref 11.4–15.2)

## 2020-11-25 LAB — COMPREHENSIVE METABOLIC PANEL
ALT: 23 U/L (ref 0–44)
AST: 29 U/L (ref 15–41)
Albumin: 3.8 g/dL (ref 3.5–5.0)
Alkaline Phosphatase: 69 U/L (ref 38–126)
Anion gap: 8 (ref 5–15)
BUN: 14 mg/dL (ref 8–23)
CO2: 30 mmol/L (ref 22–32)
Calcium: 9.6 mg/dL (ref 8.9–10.3)
Chloride: 96 mmol/L — ABNORMAL LOW (ref 98–111)
Creatinine, Ser: 0.9 mg/dL (ref 0.44–1.00)
GFR, Estimated: >60 mL/min (ref >60)
Glucose, Bld: 115 mg/dL — ABNORMAL HIGH (ref 70–99)
Potassium: 3.9 mmol/L (ref 3.5–5.1)
Sodium: 134 mmol/L — ABNORMAL LOW (ref 135–145)
Total Bilirubin: 0.7 mg/dL (ref 0.3–1.2)
Total Protein: 7.2 g/dL (ref 6.5–8.1)

## 2020-11-25 LAB — TYPE AND SCREEN
ABO/RH(D): O NEG
Antibody Screen: NEGATIVE

## 2020-11-25 LAB — URINALYSIS, ROUTINE W REFLEX MICROSCOPIC
Bilirubin Urine: NEGATIVE
Glucose, UA: NEGATIVE mg/dL
Hgb urine dipstick: NEGATIVE
Ketones, ur: NEGATIVE mg/dL
Leukocytes,Ua: NEGATIVE
Nitrite: NEGATIVE
Protein, ur: NEGATIVE mg/dL
Specific Gravity, Urine: 1.008 (ref 1.005–1.030)
pH: 6 (ref 5.0–8.0)

## 2020-11-25 LAB — SURGICAL PCR SCREEN
MRSA, PCR: NEGATIVE
Staphylococcus aureus: NEGATIVE

## 2020-11-25 LAB — CBC
HCT: 48.1 % — ABNORMAL HIGH (ref 36.0–46.0)
Hemoglobin: 15.9 g/dL — ABNORMAL HIGH (ref 12.0–15.0)
MCH: 32.4 pg (ref 26.0–34.0)
MCHC: 33.1 g/dL (ref 30.0–36.0)
MCV: 98.2 fL (ref 80.0–100.0)
Platelets: 205 10*3/uL (ref 150–400)
RBC: 4.9 MIL/uL (ref 3.87–5.11)
RDW: 12.2 % (ref 11.5–15.5)
WBC: 8 10*3/uL (ref 4.0–10.5)
nRBC: 0 % (ref 0.0–0.2)

## 2020-11-25 LAB — PCV MYOCARDIAL PERFUSION WITH LEXISCAN: ST Depression (mm): 0 mm

## 2020-11-25 LAB — APTT: aPTT: 29 seconds (ref 24–36)

## 2020-11-25 NOTE — Progress Notes (Signed)
PCP - Isabeau Stain Cardiologist - denies  Chest x-ray - 06/05/20 (1 view) EKG - 10/20/20 ECHO - 11/18/20 Peripheral Vascular Cath - 2018   Blood Thinner Instructions: follow your surgeon's instructions on when to stop Plavix. Patient stated she will take last dose on Friday 11/27/20   COVID TEST- scheduled for 11/30/20 (surgery admit)   Anesthesia review: yes, stroke history, pt on blood thinner, EKG (10/20/20)  Patient denies shortness of breath, fever, cough and chest pain at PAT appointment   All instructions explained to the patient, with a verbal understanding of the material. Patient agrees to go over the instructions while at home for a better understanding. Patient also instructed to self quarantine after being tested for COVID-19. The opportunity to ask questions was provided.

## 2020-11-26 ENCOUNTER — Encounter (HOSPITAL_COMMUNITY): Payer: Self-pay

## 2020-11-26 NOTE — Progress Notes (Signed)
Will discuss with Dr. Einar Gip at upcoming OV>

## 2020-11-26 NOTE — Progress Notes (Signed)
Anesthesia Chart Review:   Case: [id number] Date/Time: 12/03/20 0714   Procedures:      LEFT FEMORAL ENDARTERECTOMY (Left)     INSERTION OF LEFT ILIAC STENT (Left)   Anesthesia type: General   Pre-op diagnosis: PAD   Location: [facility] OR ROOM 16 / [facility]   Surgeons: [name], MD       DISCUSSION: Pt is 86 years old with hx HTN, stroke, PAD, COPD. Current smoker.   VS: BP (!) 131/53   Pulse 86   Temp 36.6 C (Oral)   Resp 18   Ht 5\' 4"  (1.626 m)   Wt 46.7 kg   SpO2 100%   BMI 17.66 kg/m   PROVIDERS: - PCP is Tonia Ghent, MD - Saw Celeste Cantwell PA-C on 10/20/2020 for preop eval. Stress test and echo ordered, results below. Dr. Adrian Prows felt stress test was low risk. Pt cleared for surgery at last office visit 12/01/20.    LABS: Labs reviewed: Acceptable for surgery. (all labs ordered are listed, but only abnormal results are displayed)  Labs Reviewed  CBC - Abnormal; Notable for the following components:      Result Value   Hemoglobin 15.9 (*)    HCT 48.1 (*)    All other components within normal limits  COMPREHENSIVE METABOLIC PANEL - Abnormal; Notable for the following components:   Sodium 134 (*)    Chloride 96 (*)    Glucose, Bld 115 (*)    All other components within normal limits  SURGICAL PCR SCREEN  PROTIME-INR  APTT  URINALYSIS, ROUTINE W REFLEX MICROSCOPIC  TYPE AND SCREEN     IMAGES: CT angio chest 06/05/20:  1. No evidence of pulmonary embolus. 2. Stable left-sided pulmonary nodules, largest in the left lower lobe measuring 10 x 10 mm. Non-contrast chest CT in 6-12 months is considered optional for low-risk patients, but is recommended for high-risk patients.  3. No acute intrathoracic process. 4. Aortic Atherosclerosis (ICD10-I70.0) and Emphysema (ICD10-J43.9).  1 view CXR 06/05/20:  - Mild opacities at the left lung base may reflect scarring. Otherwise no acute findings   EKG 10/20/20: Sinus bradycardia with 2 PACs.  Normal axis.  Poor  R wave progression, cannot exclude anteroseptal infarct old.    CV: Nuclear stress test 11/25/20:  Carlton Adam nuclear stress test performed using 1-day protocol. - LV cavity is small. Normal myocardial perfusion. Stress LVEF 51%.  - TID is 1.30. In absence of regional perfusion abnormalities, this is a nonspecific finding. Recommend clinical correlation  Echo 11/18/20:  - Left ventricle cavity is normal in size. Mild concentric hypertrophy of the left ventricle. Normal global wall motion. Normal LV systolic function with visual EF 50-55%. Doppler evidence of grade I (impaired) diastolic dysfunction, normal LAP.  - Mild tricuspid regurgitation.  - No evidence of pulmonary hypertension.  Carotid Duplex 04/26/2019:  - Right Carotid: Velocities in the right ICA are consistent with a 1-39% stenosis. Non-hemodynamically significant plaque <50% noted in the CCA. The ECA appears <50% stenosed.  - Left Carotid: Velocities in the left ICA are consistent with a 40-59% stenosis. Non-hemodynamically significant plaque <50% noted in the CCA. The ECA appears >50% stenosed.  - Vertebrals:  Bilateral vertebral arteries demonstrate antegrade flow.  - Subclavians: Normal flow hemodynamics were seen in bilateral subclavian arteries.    Vascular ultrasound aorta/IVC/iliacs 10/06/2020: IVC/Iliac: Suboptimal exam.  - Greater than 50% stenosis in the right CIA  - 50 - 99% stenosis distal to stent and  proximal EIA, limited visualization   ABI 10/06/2020: - Right: Resting right ankle-brachial index indicates moderate right lower extremity arterial disease. The right toe-brachial index is abnormal.  - Left: Resting left ankle-brachial index indicates moderate left lower extremity arterial disease. The left toe-brachial index is abnormal.   Past Medical History:  Diagnosis Date   Cataract    bilaterally corrected.   CVA (cerebral vascular accident) ([place])    Pt was unaware and does not know when it happened.  Was  told after some testing was done.   Double vision    Dyspnea    Facial droop 10/2017   left   History of shingles 2012   HLD (hyperlipidemia)    HTN (hypertension)    Hypothyroidism    PVD (peripheral vascular disease) ([place])    Vision loss    temporary    Past Surgical History:  Procedure Laterality Date   ABDOMINAL EXPLORATION SURGERY  1950s   "they thought I was pregnant; went in to explore"   APPENDECTOMY  childhood   BACK SURGERY     BREAST LUMPECTOMY Left 1960's   benign   carotid ultrasound  06/19/2007   0-39% stenosis   CATARACT EXTRACTION W/ INTRAOCULAR LENS IMPLANT Left 09/2016   CATARACT EXTRACTION W/ INTRAOCULAR LENS IMPLANT Right 10/2016   COLONOSCOPY WITH PROPOFOL N/A 04/08/2019   Procedure: COLONOSCOPY WITH PROPOFOL;  Surgeon: Milus Banister, MD;  Location: [facility];  Service: Endoscopy;  Laterality: N/A;   DILATION AND CURETTAGE OF UTERUS  1960's X 3   miscarriages   DOPPLER ECHOCARDIOGRAPHY  06/19/2007   Normal EF 55-70%   ILIAC ARTERY STENT Left 03/18/2016   common iliac stent (6 x 40), aortic stent (10 x 19)/notes 03/18/2016   LAMINECTOMY AND MICRODISCECTOMY SPINE  1960's X 2   MOLE REMOVAL  07/10/2016   NSVD     x1   PERIPHERAL VASCULAR CATHETERIZATION N/A 03/18/2016   Procedure: Abdominal Aortogram w/Lower Extremity;  Surgeon: Elam Dutch, MD;  Location: [facility];  Service: Cardiovascular;  Laterality: N/A;   PERIPHERAL VASCULAR CATHETERIZATION  03/18/2016   Procedure: Peripheral Vascular Intervention;  Surgeon: Elam Dutch, MD;  Location: [facility];  Service: Cardiovascular;;   POLYPECTOMY  04/08/2019   Procedure: POLYPECTOMY;  Surgeon: Milus Banister, MD;  Location: [facility];  Service: Endoscopy;;    MEDICATIONS:  albuterol (PROAIR HFA) 108 (90 Base) MCG/ACT inhaler   Cholecalciferol (VITAMIN D) 50 MCG (2000 UT) tablet   Cholecalciferol (VITAMIN D3) 25 MCG (1000 UT) CAPS   clopidogrel (PLAVIX) 75 MG tablet    diphenhydrAMINE (BENADRYL) 25 MG tablet   enalapril (VASOTEC) 10 MG tablet   furosemide (LASIX) 20 MG tablet   hydrochlorothiazide (MICROZIDE) 12.5 MG capsule   levothyroxine (SYNTHROID) 50 MCG tablet   methocarbamol (ROBAXIN) 500 MG tablet   metoprolol tartrate (LOPRESSOR) 100 MG tablet   rosuvastatin (CRESTOR) 20 MG tablet   sulfamethoxazole-trimethoprim (BACTRIM DS) 800-160 MG tablet   SYMBICORT 160-4.5 MCG/ACT inhaler   No current facility-administered medications for this encounter.    If no changes, I anticipate pt can proceed with surgery as scheduled.   Willeen Cass, PhD, FNP-BC [facility]/Anesthesiology Phone: [phone] 12/01/2020 3:20 PM

## 2020-11-30 ENCOUNTER — Other Ambulatory Visit (HOSPITAL_COMMUNITY)
Admission: RE | Admit: 2020-11-30 | Discharge: 2020-11-30 | Disposition: A | Discharge status: Home / Self Care | Payer: Medicare Other | Source: Ambulatory Visit | Attending: Vascular Surgery | Admitting: Vascular Surgery

## 2020-11-30 DIAGNOSIS — Z20822 Contact with and (suspected) exposure to covid-19: Secondary | ICD-10-CM | POA: Insufficient documentation

## 2020-11-30 DIAGNOSIS — Z884 Allergy status to anesthetic agent status: Secondary | ICD-10-CM | POA: Diagnosis not present

## 2020-11-30 DIAGNOSIS — M25569 Pain in unspecified knee: Secondary | ICD-10-CM | POA: Diagnosis not present

## 2020-11-30 DIAGNOSIS — Z95820 Peripheral vascular angioplasty status with implants and grafts: Secondary | ICD-10-CM | POA: Diagnosis not present

## 2020-11-30 DIAGNOSIS — I10 Essential (primary) hypertension: Secondary | ICD-10-CM | POA: Diagnosis not present

## 2020-11-30 DIAGNOSIS — Z885 Allergy status to narcotic agent status: Secondary | ICD-10-CM | POA: Diagnosis not present

## 2020-11-30 DIAGNOSIS — I708 Atherosclerosis of other arteries: Secondary | ICD-10-CM | POA: Diagnosis not present

## 2020-11-30 DIAGNOSIS — N182 Chronic kidney disease, stage 2 (mild): Secondary | ICD-10-CM | POA: Diagnosis not present

## 2020-11-30 DIAGNOSIS — Z8673 Personal history of transient ischemic attack (TIA), and cerebral infarction without residual deficits: Secondary | ICD-10-CM | POA: Diagnosis not present

## 2020-11-30 DIAGNOSIS — I6523 Occlusion and stenosis of bilateral carotid arteries: Secondary | ICD-10-CM | POA: Diagnosis not present

## 2020-11-30 DIAGNOSIS — E785 Hyperlipidemia, unspecified: Secondary | ICD-10-CM | POA: Diagnosis not present

## 2020-11-30 DIAGNOSIS — I7409 Other arterial embolism and thrombosis of abdominal aorta: Secondary | ICD-10-CM | POA: Diagnosis not present

## 2020-11-30 DIAGNOSIS — F17210 Nicotine dependence, cigarettes, uncomplicated: Secondary | ICD-10-CM | POA: Diagnosis not present

## 2020-11-30 DIAGNOSIS — Z823 Family history of stroke: Secondary | ICD-10-CM | POA: Diagnosis not present

## 2020-11-30 DIAGNOSIS — I70212 Atherosclerosis of native arteries of extremities with intermittent claudication, left leg: Secondary | ICD-10-CM | POA: Diagnosis not present

## 2020-11-30 DIAGNOSIS — Z01812 Encounter for preprocedural laboratory examination: Secondary | ICD-10-CM | POA: Insufficient documentation

## 2020-11-30 DIAGNOSIS — R0609 Other forms of dyspnea: Secondary | ICD-10-CM | POA: Diagnosis not present

## 2020-11-30 DIAGNOSIS — J309 Allergic rhinitis, unspecified: Secondary | ICD-10-CM | POA: Diagnosis not present

## 2020-11-30 DIAGNOSIS — I739 Peripheral vascular disease, unspecified: Secondary | ICD-10-CM | POA: Diagnosis not present

## 2020-11-30 DIAGNOSIS — Z888 Allergy status to other drugs, medicaments and biological substances status: Secondary | ICD-10-CM | POA: Diagnosis not present

## 2020-11-30 DIAGNOSIS — I1310 Hypertensive heart and chronic kidney disease without heart failure, with stage 1 through stage 4 chronic kidney disease, or unspecified chronic kidney disease: Secondary | ICD-10-CM | POA: Diagnosis not present

## 2020-11-30 DIAGNOSIS — I70222 Atherosclerosis of native arteries of extremities with rest pain, left leg: Secondary | ICD-10-CM | POA: Diagnosis not present

## 2020-11-30 DIAGNOSIS — E039 Hypothyroidism, unspecified: Secondary | ICD-10-CM | POA: Diagnosis not present

## 2020-11-30 DIAGNOSIS — Z8619 Personal history of other infectious and parasitic diseases: Secondary | ICD-10-CM | POA: Diagnosis not present

## 2020-11-30 LAB — SARS CORONAVIRUS 2 (TAT 6-24 HRS): SARS Coronavirus 2: NEGATIVE

## 2020-12-01 ENCOUNTER — Ambulatory Visit: Payer: Medicare Other | Admitting: Cardiology

## 2020-12-01 ENCOUNTER — Encounter: Payer: Self-pay | Admitting: Cardiology

## 2020-12-01 ENCOUNTER — Other Ambulatory Visit: Payer: Self-pay

## 2020-12-01 VITALS — BP 134/77 | HR 67 | Temp 98.0°F | Resp 17 | Ht 64.0 in | Wt 102.0 lb

## 2020-12-01 DIAGNOSIS — I739 Peripheral vascular disease, unspecified: Secondary | ICD-10-CM | POA: Diagnosis not present

## 2020-12-01 DIAGNOSIS — R0609 Other forms of dyspnea: Secondary | ICD-10-CM | POA: Diagnosis not present

## 2020-12-01 DIAGNOSIS — I6523 Occlusion and stenosis of bilateral carotid arteries: Secondary | ICD-10-CM

## 2020-12-01 NOTE — Anesthesia Preprocedure Evaluation (Addendum)
Anesthesia Evaluation  Patient identified by MRN, date of birth, ID band Patient awake    Reviewed: Allergy & Precautions, NPO status , Patient's Chart, lab work & pertinent test results, reviewed documented beta blocker date and time   Airway Mallampati: I  TM Distance: >3 FB Neck ROM: Full    Dental  (+) Edentulous Upper, Edentulous Lower, Dental Advisory Given   Pulmonary COPD,  COPD inhaler, Current Smoker and Patient abstained from smoking.,    Pulmonary exam normal breath sounds clear to auscultation       Cardiovascular hypertension, Pt. on home beta blockers and Pt. on medications + Peripheral Vascular Disease  Normal cardiovascular exam Rhythm:Regular Rate:Normal  EKG 10/20/20: Sinus bradycardia with 2 PACs. Normal axis. Poor R wave progression, cannot exclude anteroseptal infarct old.    CV: Nuclear stress test 11/25/20:  Carlton Adam nuclear stress test performed using 1-day protocol. - LV cavity is small. Normal myocardial perfusion. Stress LVEF 51%.  - TID is 1.30. In absence of regional perfusion abnormalities, this is a nonspecific finding. Recommend clinical correlation  Echo 11/18/20:  - Left ventricle cavity is normal in size. Mild concentric hypertrophy of the left ventricle. Normal global wall motion. Normal LV systolic function with visual EF 50-55%. Doppler evidence of grade I (impaired) diastolic dysfunction, normal LAP.  - Mild tricuspid regurgitation.  - No evidence of pulmonary hypertension.  Carotid Duplex 04/26/2019: - Right Carotid: Velocities in the right ICA are consistent with a 1-39% stenosis. Non-hemodynamically significant plaque <50% noted in the CCA. The ECA appears <50% stenosed.  - Left Carotid: Velocities in the left ICA are consistent with a 40-59% stenosis. Non-hemodynamically significant plaque <50% noted in the CCA. The ECA appears >50% stenosed.  - Vertebrals: Bilateral vertebral  arteries demonstrate antegrade flow.  - Subclavians: Normal flow hemodynamics were seen in bilateral subclavianarteries.    Neuro/Psych CVA, No Residual Symptoms negative psych ROS   GI/Hepatic negative GI ROS, Neg liver ROS,   Endo/Other  Hypothyroidism   Renal/GU negative Renal ROS  negative genitourinary   Musculoskeletal negative musculoskeletal ROS (+)   Abdominal   Peds  Hematology  (+) Blood dyscrasia (on plavix, last dose 10/7), ,   Anesthesia Other Findings   Reproductive/Obstetrics                          Anesthesia Physical Anesthesia Plan  ASA: 3  Anesthesia Plan: General   Post-op Pain Management:    Induction: Intravenous  PONV Risk Score and Plan: 2 and Dexamethasone, Ondansetron and Treatment may vary due to age or medical condition  Airway Management Planned: Oral ETT  Additional Equipment: Arterial line  Intra-op Plan:   Post-operative Plan: Extubation in OR  Informed Consent: I have reviewed the patients History and Physical, chart, labs and discussed the procedure including the risks, benefits and alternatives for the proposed anesthesia with the patient or authorized representative who has indicated his/her understanding and acceptance.     Dental advisory given  Plan Discussed with: CRNA  Anesthesia Plan Comments: (2 IVs)     Anesthesia Quick Evaluation

## 2020-12-01 NOTE — Progress Notes (Signed)
Primary Physician/Referring:  Tonia Ghent, MD  Patient ID: Jill Franco, female    DOB: 24-Oct-1934, 86 y.o.   MRN: [id number]  Chief Complaint  Patient presents with   Results   PAD   Follow-up   HPI:    Jill Franco  is a 86 y.o. Caucasian female with history of hypertension, hyperlipidemia, PAD, tobacco use (smokes about half pack per day), patient reported history of CVA (unclear when), COPD, and hypothyroidism.  Denies history of diabetes, MI. Referred to our office for preoperative risk stratification by Dr. Fuller Song (vascular surgery) as patient has planned upcoming left femoral endarterectomy with retrograde iliac stenting.   Patient presents for cardiac testing.  Last office visit ordered echocardiogram which revealed normal LVEF with grade 1 diastolic dysfunction and mild LVH, otherwise no significant abnormalities.  She also underwent Lexiscan nuclear stress test for which I personally reviewed the images with Dr. Einar Gip and he feels it is overall low risk.   Patient presents with her daughter at bedside.  Patient is feeling well overall and without specific concerns or complaints.  Last office visit switch patient from pravastatin to rosuvastatin.  She is tolerating rosuvastatin without issue.  Notably patient has history of stenting to the right leg by vascular surgery.  She continues to follow with vascular surgery for management of PAD and carotid artery stenosis bilaterally.  Past Medical History:  Diagnosis Date   Cataract    bilaterally corrected.   COPD (chronic obstructive pulmonary disease) ([place])    CVA (cerebral vascular accident) ([place])    Pt was unaware and does not know when it happened.  Was told after some testing was done.   Double vision    Dyspnea    Facial droop 10/2017   left   History of shingles 2012   HLD (hyperlipidemia)    HTN (hypertension)    Hypothyroidism    PVD (peripheral vascular disease) ([place])    Vision loss     temporary   Past Surgical History:  Procedure Laterality Date   ABDOMINAL EXPLORATION SURGERY  1950s   "they thought I was pregnant; went in to explore"   APPENDECTOMY  childhood   BACK SURGERY     BREAST LUMPECTOMY Left 1960's   benign   carotid ultrasound  06/19/2007   0-39% stenosis   CATARACT EXTRACTION W/ INTRAOCULAR LENS IMPLANT Left 09/2016   CATARACT EXTRACTION W/ INTRAOCULAR LENS IMPLANT Right 10/2016   COLONOSCOPY WITH PROPOFOL N/A 04/08/2019   Procedure: COLONOSCOPY WITH PROPOFOL;  Surgeon: Milus Banister, MD;  Location: [facility];  Service: Endoscopy;  Laterality: N/A;   DILATION AND CURETTAGE OF UTERUS  1960's X 3   miscarriages   DOPPLER ECHOCARDIOGRAPHY  06/19/2007   Normal EF 55-70%   ILIAC ARTERY STENT Left 03/18/2016   common iliac stent (6 x 40), aortic stent (10 x 19)/notes 03/18/2016   LAMINECTOMY AND MICRODISCECTOMY SPINE  1960's X 2   MOLE REMOVAL  07/10/2016   NSVD     x1   PERIPHERAL VASCULAR CATHETERIZATION N/A 03/18/2016   Procedure: Abdominal Aortogram w/Lower Extremity;  Surgeon: Elam Dutch, MD;  Location: [facility];  Service: Cardiovascular;  Laterality: N/A;   PERIPHERAL VASCULAR CATHETERIZATION  03/18/2016   Procedure: Peripheral Vascular Intervention;  Surgeon: Elam Dutch, MD;  Location: [facility];  Service: Cardiovascular;;   POLYPECTOMY  04/08/2019   Procedure: POLYPECTOMY;  Surgeon: Milus Banister, MD;  Location: [facility];  Service: Endoscopy;;   Family History  Problem Relation Age of Onset   Stroke Father 77   Cancer Brother    Colon cancer Neg Hx    Breast cancer Neg Hx    Colon polyps Neg Hx    Esophageal cancer Neg Hx    Rectal cancer Neg Hx    Stomach cancer Neg Hx     Social History   Tobacco Use   Smoking status: Every Day    Packs/day: 0.25    Years: 69.00    Pack years: 17.25    Types: Cigarettes    Last attempt to quit: 04/21/2019    Years since quitting: 1.6   Smokeless tobacco:  Never  Substance Use Topics   Alcohol use: No    Alcohol/week: 0.0 standard drinks   Marital Status: Widowed   ROS  Review of Systems  Constitutional: Negative for malaise/fatigue and weight gain.  Cardiovascular:  Positive for claudication, dyspnea on exertion (stable) and leg swelling (intermittent). Negative for chest pain, near-syncope, orthopnea, palpitations, paroxysmal nocturnal dyspnea and syncope.  Neurological:  Negative for dizziness.   Objective  Blood pressure 134/77, pulse 67, temperature 98 F (36.7 C), temperature source Temporal, resp. rate 17, height 5\' 4"  (1.626 m), weight 102 lb (46.3 kg), SpO2 98 %.  Vitals with BMI 12/01/2020 11/25/2020 10/27/2020  Height 5\' 4"  5\' 4"  5\' 4"   Weight 102 lbs 102 lbs 14 oz 102 lbs  BMI 17.5 93.81 01.7  Systolic 510 258 527  Diastolic 77 53 79  Pulse 67 86 67      Physical Exam Vitals reviewed.  Constitutional:      Comments: Frail  Cardiovascular:     Rate and Rhythm: Normal rate and regular rhythm.     Pulses: Intact distal pulses.          Dorsalis pedis pulses are 0 on the right side and 0 on the left side.       Posterior tibial pulses are 0 on the right side and 0 on the left side.     Heart sounds: S1 normal and S2 normal. No murmur heard.   No gallop.     Comments: No evidence of open wounds or ulcers. Pulmonary:     Effort: Pulmonary effort is normal.     Breath sounds: Decreased air movement (bilaterally throughout) present. No rhonchi or rales.  Musculoskeletal:     Right lower leg: No edema.     Left lower leg: No edema.  Neurological:     Mental Status: She is alert.    Laboratory examination:   Recent Labs    06/05/20 1935 06/16/20 1241 07/02/20 1152 11/25/20 1030  NA 128* 132* 135 134*  K 3.4* 4.3 4.2 3.9  CL 95* 95* 95* 96*  CO2 23 32 34* 30  GLUCOSE 120* 162* 208* 115*  BUN 20 15 21 14   CREATININE 0.75 0.72 0.91 0.90  CALCIUM 8.1* 8.7 9.3 9.6  GFRNONAA >60  --   --  >60   estimated  creatinine clearance is 34 mL/min (by C-G formula based on SCr of 0.9 mg/dL).  CMP Latest Ref Rng & Units 11/25/2020 07/02/2020 06/16/2020  Glucose 70 - 99 mg/dL 115(H) 208(H) 162(H)  BUN 8 - 23 mg/dL 14 21 15   Creatinine 0.44 - 1.00 mg/dL 0.90 0.91 0.72  Sodium 135 - 145 mmol/L 134(L) 135 132(L)  Potassium 3.5 - 5.1 mmol/L 3.9 4.2 4.3  Chloride 98 - 111 mmol/L 96(L) 95(L) 95(L)  CO2 22 - 32 mmol/L 30 34(H) 32  Calcium 8.9 - 10.3 mg/dL 9.6 9.3 8.7  Total Protein 6.5 - 8.1 g/dL 7.2 6.9 6.0  Total Bilirubin 0.3 - 1.2 mg/dL 0.7 0.5 0.7  Alkaline Phos 38 - 126 U/L 69 99 167(H)  AST 15 - 41 U/L 29 20 49(H)  ALT 0 - 44 U/L 23 21 68(H)   CBC Latest Ref Rng & Units 11/25/2020 07/02/2020 06/05/2020  WBC 4.0 - 10.5 K/uL 8.0 7.6 9.4  Hemoglobin 12.0 - 15.0 g/dL 15.9(H) 15.9(H) 15.2(H)  Hematocrit 36.0 - 46.0 % 48.1(H) 46.0 44.2  Platelets 150 - 400 K/uL 205 200.0 123(L)    Lipid Panel Recent Labs    09/18/20 0918  CHOL 175  TRIG 99.0  LDLCALC 84  VLDL 19.8  HDL 71.20  CHOLHDL 2    HEMOGLOBIN A1C Lab Results  Component Value Date   HGBA1C 5.7 09/18/2020   TSH Recent Labs    06/16/20 1241  TSH 2.82    External labs:   None   Allergies   Allergies  Allergen Reactions   Aspirin Other (See Comments)    REACTION: Uncoated Stomach upset on empty stomach   Codeine Nausea Only and Other (See Comments)    Pill needs to be E.coated   Ibuprofen Nausea And Vomiting   Lipitor [Atorvastatin] Other (See Comments)    myalgia   Other Itching and Rash    Nicotine patches     Medications Prior to Visit:   Outpatient Medications Prior to Visit  Medication Sig Dispense Refill   albuterol (PROAIR HFA) 108 (90 Base) MCG/ACT inhaler Inhale 2 puffs into the lungs every 6 (six) hours as needed for wheezing or shortness of breath. 18 g 5   Cholecalciferol (VITAMIN D3) 25 MCG (1000 UT) CAPS Take 1 capsule (1,000 Units total) by mouth daily.     clopidogrel (PLAVIX) 75 MG tablet TAKE 1 TABLET  BY MOUTH  DAILY 90 tablet 3   diphenhydrAMINE (BENADRYL) 25 MG tablet Take 25 mg by mouth at bedtime as needed for sleep.     enalapril (VASOTEC) 10 MG tablet TAKE 1 AND 1/2 TABLETS BY  MOUTH DAILY 135 tablet 3   furosemide (LASIX) 20 MG tablet Take 1 tablet (20 mg total) by mouth daily. 90 tablet 1   hydrochlorothiazide (MICROZIDE) 12.5 MG capsule TAKE 1 CAPSULE BY MOUTH IN  THE MORNING 90 capsule 3   levothyroxine (SYNTHROID) 50 MCG tablet TAKE 1 TABLET BY MOUTH  DAILY EXCEPT 2 TABLETS ON  SUNDAYS AND WEDNESDAYS (  TOTAL 9 TABS PER WEEK) 117 tablet 3   methocarbamol (ROBAXIN) 500 MG tablet Take 1 tablet (500 mg total) by mouth every 8 (eight) hours as needed for muscle spasms. 30 tablet 1   metoprolol tartrate (LOPRESSOR) 100 MG tablet TAKE ONE-HALF TABLET BY  MOUTH IN THE MORNING AND 1  TABLET BY MOUTH IN THE  EVENING 135 tablet 3   rosuvastatin (CRESTOR) 20 MG tablet Take 1 tablet (20 mg total) by mouth daily. 90 tablet 3   SYMBICORT 160-4.5 MCG/ACT inhaler INHALE 2 INHALATIONS BY  MOUTH INTO THE LUNGS TWICE  DAILY RINSE AFTER USE 30.6 g 3   Cholecalciferol (VITAMIN D) 50 MCG (2000 UT) tablet Take 2,000 Units by mouth daily.     sulfamethoxazole-trimethoprim (BACTRIM DS) 800-160 MG tablet Take 1 tablet by mouth 2 (two) times daily. 6 tablet 0   No facility-administered medications prior to visit.   Final Medications at End  of Visit    Current Meds  Medication Sig   albuterol (PROAIR HFA) 108 (90 Base) MCG/ACT inhaler Inhale 2 puffs into the lungs every 6 (six) hours as needed for wheezing or shortness of breath.   Cholecalciferol (VITAMIN D3) 25 MCG (1000 UT) CAPS Take 1 capsule (1,000 Units total) by mouth daily.   clopidogrel (PLAVIX) 75 MG tablet TAKE 1 TABLET BY MOUTH  DAILY   diphenhydrAMINE (BENADRYL) 25 MG tablet Take 25 mg by mouth at bedtime as needed for sleep.   enalapril (VASOTEC) 10 MG tablet TAKE 1 AND 1/2 TABLETS BY  MOUTH DAILY   furosemide (LASIX) 20 MG tablet Take 1  tablet (20 mg total) by mouth daily.   hydrochlorothiazide (MICROZIDE) 12.5 MG capsule TAKE 1 CAPSULE BY MOUTH IN  THE MORNING   levothyroxine (SYNTHROID) 50 MCG tablet TAKE 1 TABLET BY MOUTH  DAILY EXCEPT 2 TABLETS ON  SUNDAYS AND WEDNESDAYS (  TOTAL 9 TABS PER WEEK)   methocarbamol (ROBAXIN) 500 MG tablet Take 1 tablet (500 mg total) by mouth every 8 (eight) hours as needed for muscle spasms.   metoprolol tartrate (LOPRESSOR) 100 MG tablet TAKE ONE-HALF TABLET BY  MOUTH IN THE MORNING AND 1  TABLET BY MOUTH IN THE  EVENING   rosuvastatin (CRESTOR) 20 MG tablet Take 1 tablet (20 mg total) by mouth daily.   SYMBICORT 160-4.5 MCG/ACT inhaler INHALE 2 INHALATIONS BY  MOUTH INTO THE LUNGS TWICE  DAILY RINSE AFTER USE   Radiology:   No results found.  Cardiac Studies:  Carotid Duplex 04/26/2019:  Right Carotid: Velocities in the right ICA are consistent with a 1-39%  stenosis. Non-hemodynamically significant plaque <50% noted in the  CCA. The ECA appears <50% stenosed.  Left Carotid: Velocities in the left ICA are consistent with a 40-59%  stenosis. Non-hemodynamically significant plaque <50% noted in the  CCA. The ECA appears >50% stenosed.  Vertebrals:  Bilateral vertebral arteries demonstrate antegrade flow.  Subclavians: Normal flow hemodynamics were seen in bilateral subclavian arteries.   Vascular ultrasound aorta/IVC/iliacs 10/06/2020: IVC/Iliac: Suboptimal exam.  - Greater than 50% stenosis in the right CIA  - 50 - 99% stenosis distal to stent and proximal EIA, limited visualization  ABI 10/06/2020: Right: Resting right ankle-brachial index indicates moderate right lower extremity arterial disease. The right toe-brachial index is abnormal.  Left: Resting left ankle-brachial index indicates moderate left lower extremity arterial disease. The left toe-brachial index is abnormal.  PCV ECHOCARDIOGRAM COMPLETE 11/18/2020 Left ventricle cavity is normal in size. Mild concentric hypertrophy  of the left ventricle. Normal global wall motion. Normal LV systolic function with visual EF 50-55%. Doppler evidence of grade I (impaired) diastolic dysfunction, normal LAP. Mild tricuspid regurgitation. No evidence of pulmonary hypertension.   PCV MYOCARDIAL PERFUSION WITH LEXISCAN 11/25/2020 Lexiscan nuclear stress test performed using 1-day protocol. LV cavity is small. Normal myocardial perfusion. Stress LVEF 51%. TID is 1.30. In absence of regional perfusion abnormalities, this is a nonspecific finding. Recommend clinical correlation. Personally reviewed with Dr. Einar Gip who feels stress test is overall low risk.  EKG:   10/20/2020: Sinus bradycardia with 2 PACs at a rate of 59 bpm.  Normal axis.  Poor R wave progression, cannot exclude anteroseptal infarct old.  No evidence of ischemia or underlying injury pattern.  Assessment     ICD-10-CM   1. PAD (peripheral artery disease) (HCC)  I73.9     2. Dyspnea on exertion  R06.09     3. Bilateral carotid artery  stenosis  I65.23        Medications Discontinued During This Encounter  Medication Reason   sulfamethoxazole-trimethoprim (BACTRIM DS) 800-160 MG tablet Error   Cholecalciferol (VITAMIN D) 50 MCG (2000 UT) tablet Error    No orders of the defined types were placed in this encounter.   Recommendations:   ZEYNEP FANTROY is a 86 y.o. Caucasian female with history of hypertension, hyperlipidemia, PAD, tobacco use (smokes about half pack per day), patient reported history of CVA (unclear when), COPD, and hypothyroidism.  Denies history of diabetes, MI. Referred to our office for preoperative risk stratification by Dr. Fuller Song (vascular surgery) as patient has planned upcoming left femoral endarterectomy with retrograde iliac stenting.   Given patient's multiple cardiovascular risk factors as well as symptoms of dyspnea on exertion therefore recommended further cardiac testing prior to preoperative risk stratification.   Patient underwent echocardiogram which was without significant abnormalities and noted normal LVEF, also underwent nuclear stress testing.  I personally reviewed images and stress test with Dr. Einar Gip particularly in view of TID of 1.3.  Dr. Einar Gip feels that stress test is overall low risk, and is not prohibitive of proceeding with vascular surgery.  Reviewed and discussed with patient and her daughter who is present at bedside results of echocardiogram and stress testing, details above.  Advised patient that she may proceed with upcoming vascular surgery without further cardiovascular evaluations and relatively low risk.  In regard to hyperlipidemia we will continue rosuvastatin.  We will repeat lipid profile testing in approximately 3 months.  Patient likely has chronic diastolic heart failure given history of mildly elevated BNP multiple times according to record review.  Follow-up in 6 months, sooner if needed.  Patient was seen in collaboration with Dr. Einar Gip, who personally reviewed stress test, and is in agreement with the plan/recommendations.    Alethia Berthold, PA-C 12/01/2020, 3:05 PM Office: [phone]

## 2020-12-02 ENCOUNTER — Other Ambulatory Visit: Payer: Medicare Other

## 2020-12-03 ENCOUNTER — Encounter (HOSPITAL_COMMUNITY): Payer: Self-pay | Admitting: Vascular Surgery

## 2020-12-03 ENCOUNTER — Inpatient Hospital Stay (HOSPITAL_COMMUNITY)
Admission: RE | Admit: 2020-12-03 | Discharge: 2020-12-04 | DRG: 272 | Disposition: A | Discharge status: Home / Self Care | Payer: Medicare Other | Attending: Vascular Surgery | Admitting: Vascular Surgery

## 2020-12-03 ENCOUNTER — Inpatient Hospital Stay (HOSPITAL_COMMUNITY): Payer: Medicare Other | Admitting: Anesthesiology

## 2020-12-03 ENCOUNTER — Inpatient Hospital Stay (HOSPITAL_COMMUNITY): Payer: Medicare Other

## 2020-12-03 ENCOUNTER — Other Ambulatory Visit: Payer: Self-pay

## 2020-12-03 ENCOUNTER — Ambulatory Visit: Payer: Medicare Other | Admitting: Cardiology

## 2020-12-03 ENCOUNTER — Inpatient Hospital Stay (HOSPITAL_COMMUNITY): Payer: Medicare Other | Admitting: Emergency Medicine

## 2020-12-03 ENCOUNTER — Encounter (HOSPITAL_COMMUNITY)
Admission: RE | Disposition: A | Discharge status: Home / Self Care | Payer: Self-pay | Source: Home / Self Care | Attending: Vascular Surgery

## 2020-12-03 DIAGNOSIS — Z885 Allergy status to narcotic agent status: Secondary | ICD-10-CM

## 2020-12-03 DIAGNOSIS — Z884 Allergy status to anesthetic agent status: Secondary | ICD-10-CM | POA: Diagnosis not present

## 2020-12-03 DIAGNOSIS — Z8673 Personal history of transient ischemic attack (TIA), and cerebral infarction without residual deficits: Secondary | ICD-10-CM

## 2020-12-03 DIAGNOSIS — I1310 Hypertensive heart and chronic kidney disease without heart failure, with stage 1 through stage 4 chronic kidney disease, or unspecified chronic kidney disease: Secondary | ICD-10-CM | POA: Diagnosis present

## 2020-12-03 DIAGNOSIS — Z8619 Personal history of other infectious and parasitic diseases: Secondary | ICD-10-CM

## 2020-12-03 DIAGNOSIS — E039 Hypothyroidism, unspecified: Secondary | ICD-10-CM | POA: Diagnosis present

## 2020-12-03 DIAGNOSIS — E785 Hyperlipidemia, unspecified: Secondary | ICD-10-CM | POA: Diagnosis present

## 2020-12-03 DIAGNOSIS — Z823 Family history of stroke: Secondary | ICD-10-CM | POA: Diagnosis not present

## 2020-12-03 DIAGNOSIS — N182 Chronic kidney disease, stage 2 (mild): Secondary | ICD-10-CM | POA: Diagnosis present

## 2020-12-03 DIAGNOSIS — I7409 Other arterial embolism and thrombosis of abdominal aorta: Secondary | ICD-10-CM | POA: Diagnosis not present

## 2020-12-03 DIAGNOSIS — M25569 Pain in unspecified knee: Secondary | ICD-10-CM | POA: Diagnosis not present

## 2020-12-03 DIAGNOSIS — I70212 Atherosclerosis of native arteries of extremities with intermittent claudication, left leg: Principal | ICD-10-CM | POA: Diagnosis present

## 2020-12-03 DIAGNOSIS — I708 Atherosclerosis of other arteries: Secondary | ICD-10-CM | POA: Diagnosis present

## 2020-12-03 DIAGNOSIS — F17210 Nicotine dependence, cigarettes, uncomplicated: Secondary | ICD-10-CM | POA: Diagnosis present

## 2020-12-03 DIAGNOSIS — Z20822 Contact with and (suspected) exposure to covid-19: Secondary | ICD-10-CM | POA: Diagnosis present

## 2020-12-03 DIAGNOSIS — Z888 Allergy status to other drugs, medicaments and biological substances status: Secondary | ICD-10-CM | POA: Diagnosis not present

## 2020-12-03 DIAGNOSIS — I70222 Atherosclerosis of native arteries of extremities with rest pain, left leg: Secondary | ICD-10-CM | POA: Diagnosis not present

## 2020-12-03 DIAGNOSIS — Z95820 Peripheral vascular angioplasty status with implants and grafts: Secondary | ICD-10-CM | POA: Diagnosis not present

## 2020-12-03 DIAGNOSIS — I739 Peripheral vascular disease, unspecified: Secondary | ICD-10-CM | POA: Diagnosis present

## 2020-12-03 HISTORY — PX: ULTRASOUND GUIDANCE FOR VASCULAR ACCESS: SHX6516

## 2020-12-03 HISTORY — PX: INSERTION OF ILIAC STENT: SHX6256

## 2020-12-03 HISTORY — PX: PATCH ANGIOPLASTY: SHX6230

## 2020-12-03 HISTORY — PX: ENDARTERECTOMY FEMORAL: SHX5804

## 2020-12-03 HISTORY — PX: LOWER EXTREMITY ANGIOGRAM: SHX5508

## 2020-12-03 LAB — CREATININE, SERUM
Creatinine, Ser: 0.75 mg/dL (ref 0.44–1.00)
GFR, Estimated: >60 mL/min (ref >60)

## 2020-12-03 LAB — POCT ACTIVATED CLOTTING TIME
Activated Clotting Time: 289 seconds
Activated Clotting Time: 225 seconds
Activated Clotting Time: 248 seconds
Activated Clotting Time: 271 seconds

## 2020-12-03 LAB — CBC
HCT: 40.4 % (ref 36.0–46.0)
Hemoglobin: 13.6 g/dL (ref 12.0–15.0)
MCH: 32.5 pg (ref 26.0–34.0)
MCHC: 33.7 g/dL (ref 30.0–36.0)
MCV: 96.4 fL (ref 80.0–100.0)
Platelets: 162 10*3/uL (ref 150–400)
RBC: 4.19 MIL/uL (ref 3.87–5.11)
RDW: 12.2 % (ref 11.5–15.5)
WBC: 11 10*3/uL — ABNORMAL HIGH (ref 4.0–10.5)
nRBC: 0 % (ref 0.0–0.2)

## 2020-12-03 SURGERY — ENDARTERECTOMY, FEMORAL
Anesthesia: General | Site: Groin | Laterality: Right

## 2020-12-03 MED ORDER — 0.9 % SODIUM CHLORIDE (POUR BTL) OPTIME
TOPICAL | Status: DC | PRN
  Administered 2020-12-03: 1000 mL

## 2020-12-03 MED ORDER — DOCUSATE SODIUM 100 MG PO CAPS
100.0000 mg | ORAL_CAPSULE | Freq: Every day | ORAL | Status: DC
  Administered 2020-12-04: 100 mg via ORAL
  Filled 2020-12-03: qty 1

## 2020-12-03 MED ORDER — HEPARIN SODIUM (PORCINE) 5000 UNIT/ML IJ SOLN
5000.0000 [IU] | Freq: Three times a day (TID) | INTRAMUSCULAR | Status: DC
  Administered 2020-12-04: 5000 [IU] via SUBCUTANEOUS
  Filled 2020-12-03: qty 1

## 2020-12-03 MED ORDER — ROSUVASTATIN CALCIUM 20 MG PO TABS
20.0000 mg | ORAL_TABLET | Freq: Every day | ORAL | Status: DC
  Administered 2020-12-04: 20 mg via ORAL
  Filled 2020-12-03: qty 1

## 2020-12-03 MED ORDER — ALBUTEROL SULFATE (2.5 MG/3ML) 0.083% IN NEBU: 3.0000 mL | INHALATION_SOLUTION | Freq: Four times a day (QID) | RESPIRATORY_TRACT | Status: DC | PRN

## 2020-12-03 MED ORDER — ENALAPRIL MALEATE 5 MG PO TABS
15.0000 mg | ORAL_TABLET | Freq: Every day | ORAL | Status: DC
  Administered 2020-12-04: 15 mg via ORAL
  Filled 2020-12-03: qty 3

## 2020-12-03 MED ORDER — PHENYLEPHRINE HCL-NACL 20-0.9 MG/250ML-% IV SOLN
INTRAVENOUS | Status: DC | PRN
  Administered 2020-12-03: 40 ug/min via INTRAVENOUS

## 2020-12-03 MED ORDER — EPHEDRINE SULFATE-NACL 50-0.9 MG/10ML-% IV SOSY
PREFILLED_SYRINGE | INTRAVENOUS | Status: DC | PRN
  Administered 2020-12-03 (×2): 5 mg via INTRAVENOUS

## 2020-12-03 MED ORDER — FENTANYL CITRATE (PF) 250 MCG/5ML IJ SOLN
INTRAMUSCULAR | Status: DC | PRN
  Administered 2020-12-03 (×5): 50 ug via INTRAVENOUS

## 2020-12-03 MED ORDER — CHLORHEXIDINE GLUCONATE CLOTH 2 % EX PADS: 6.0000 | MEDICATED_PAD | Freq: Once | CUTANEOUS | Status: DC

## 2020-12-03 MED ORDER — PHENOL 1.4 % MT LIQD: 1.0000 | OROMUCOSAL | Status: DC | PRN

## 2020-12-03 MED ORDER — ONDANSETRON HCL 4 MG/2ML IJ SOLN
INTRAMUSCULAR | Status: AC
  Filled 2020-12-03: qty 2

## 2020-12-03 MED ORDER — HEPARIN SODIUM (PORCINE) 1000 UNIT/ML IJ SOLN
INTRAMUSCULAR | Status: DC | PRN
  Administered 2020-12-03: 2000 [IU] via INTRAVENOUS
  Administered 2020-12-03: 5000 [IU] via INTRAVENOUS

## 2020-12-03 MED ORDER — ACETAMINOPHEN 325 MG PO TABS
650.0000 mg | ORAL_TABLET | Freq: Once | ORAL | Status: AC
  Administered 2020-12-03: 650 mg via ORAL
  Filled 2020-12-03: qty 2

## 2020-12-03 MED ORDER — PROPOFOL 10 MG/ML IV BOLUS
INTRAVENOUS | Status: AC
  Filled 2020-12-03: qty 20

## 2020-12-03 MED ORDER — DEXAMETHASONE SODIUM PHOSPHATE 10 MG/ML IJ SOLN
INTRAMUSCULAR | Status: DC | PRN
  Administered 2020-12-03: 10 mg via INTRAVENOUS

## 2020-12-03 MED ORDER — HYDROCHLOROTHIAZIDE 12.5 MG PO CAPS
12.5000 mg | ORAL_CAPSULE | Freq: Every morning | ORAL | Status: DC
  Administered 2020-12-04: 12.5 mg via ORAL
  Filled 2020-12-03: qty 1

## 2020-12-03 MED ORDER — ONDANSETRON HCL 4 MG/2ML IJ SOLN
INTRAMUSCULAR | Status: DC | PRN
  Administered 2020-12-03: 4 mg via INTRAVENOUS

## 2020-12-03 MED ORDER — ORAL CARE MOUTH RINSE: 15.0000 mL | Freq: Once | OROMUCOSAL | Status: AC

## 2020-12-03 MED ORDER — SODIUM CHLORIDE 0.9 % IV SOLN: 500.0000 mL | Freq: Once | INTRAVENOUS | Status: DC | PRN

## 2020-12-03 MED ORDER — MOMETASONE FURO-FORMOTEROL FUM 200-5 MCG/ACT IN AERO
2.0000 | INHALATION_SPRAY | Freq: Two times a day (BID) | RESPIRATORY_TRACT | Status: DC
Start: 2020-12-03 — End: 2020-12-04
  Administered 2020-12-03 – 2020-12-04 (×2): 2 via RESPIRATORY_TRACT
  Filled 2020-12-03 (×2): qty 8.8

## 2020-12-03 MED ORDER — HEPARIN 6000 UNIT IRRIGATION SOLUTION
Status: DC | PRN
  Administered 2020-12-03: 1

## 2020-12-03 MED ORDER — SUGAMMADEX SODIUM 200 MG/2ML IV SOLN
INTRAVENOUS | Status: DC | PRN
  Administered 2020-12-03: 200 mg via INTRAVENOUS

## 2020-12-03 MED ORDER — CEFAZOLIN SODIUM-DEXTROSE 2-4 GM/100ML-% IV SOLN
2.0000 g | Freq: Three times a day (TID) | INTRAVENOUS | Status: AC
Start: 2020-12-03 — End: 2020-12-04
  Administered 2020-12-03 (×2): 2 g via INTRAVENOUS
  Filled 2020-12-03 (×2): qty 100

## 2020-12-03 MED ORDER — ONDANSETRON HCL 4 MG/2ML IJ SOLN: 4.0000 mg | Freq: Four times a day (QID) | INTRAMUSCULAR | Status: DC | PRN

## 2020-12-03 MED ORDER — DEXAMETHASONE SODIUM PHOSPHATE 10 MG/ML IJ SOLN
INTRAMUSCULAR | Status: AC
  Filled 2020-12-03: qty 1

## 2020-12-03 MED ORDER — OXYCODONE-ACETAMINOPHEN 5-325 MG PO TABS
1.0000 | ORAL_TABLET | ORAL | Status: DC | PRN
Start: 2020-12-03 — End: 2020-12-04

## 2020-12-03 MED ORDER — POTASSIUM CHLORIDE CRYS ER 20 MEQ PO TBCR
20.0000 meq | EXTENDED_RELEASE_TABLET | Freq: Every day | ORAL | Status: DC | PRN
Start: 2020-12-03 — End: 2020-12-04

## 2020-12-03 MED ORDER — CLOPIDOGREL BISULFATE 75 MG PO TABS
75.0000 mg | ORAL_TABLET | Freq: Every day | ORAL | Status: DC
  Administered 2020-12-04: 75 mg via ORAL
  Filled 2020-12-03: qty 1

## 2020-12-03 MED ORDER — PHENYLEPHRINE 40 MCG/ML (10ML) SYRINGE FOR IV PUSH (FOR BLOOD PRESSURE SUPPORT)
PREFILLED_SYRINGE | INTRAVENOUS | Status: DC | PRN
  Administered 2020-12-03: 160 ug via INTRAVENOUS

## 2020-12-03 MED ORDER — BISACODYL 10 MG RE SUPP: 10.0000 mg | Freq: Every day | RECTAL | Status: DC | PRN

## 2020-12-03 MED ORDER — FUROSEMIDE 20 MG PO TABS: 20.0000 mg | ORAL_TABLET | Freq: Every day | ORAL | Status: DC

## 2020-12-03 MED ORDER — FENTANYL CITRATE (PF) 100 MCG/2ML IJ SOLN
25.0000 ug | INTRAMUSCULAR | Status: DC | PRN
  Administered 2020-12-03: 25 ug via INTRAVENOUS
  Administered 2020-12-03: 50 ug via INTRAVENOUS

## 2020-12-03 MED ORDER — PROTAMINE SULFATE 10 MG/ML IV SOLN
INTRAVENOUS | Status: DC | PRN
  Administered 2020-12-03 (×5): 10 mg via INTRAVENOUS

## 2020-12-03 MED ORDER — HYDRALAZINE HCL 20 MG/ML IJ SOLN
5.0000 mg | INTRAMUSCULAR | Status: DC | PRN
Start: 2020-12-03 — End: 2020-12-04

## 2020-12-03 MED ORDER — HEPARIN SODIUM (PORCINE) 1000 UNIT/ML IJ SOLN
INTRAMUSCULAR | Status: AC
  Filled 2020-12-03: qty 1

## 2020-12-03 MED ORDER — LIDOCAINE 2% (20 MG/ML) 5 ML SYRINGE
INTRAMUSCULAR | Status: AC
  Filled 2020-12-03: qty 5

## 2020-12-03 MED ORDER — LABETALOL HCL 5 MG/ML IV SOLN: 10.0000 mg | INTRAVENOUS | Status: DC | PRN

## 2020-12-03 MED ORDER — METOPROLOL TARTRATE 5 MG/5ML IV SOLN: 2.0000 mg | INTRAVENOUS | Status: DC | PRN

## 2020-12-03 MED ORDER — MAGNESIUM SULFATE 2 GM/50ML IV SOLN: 2.0000 g | Freq: Every day | INTRAVENOUS | Status: DC | PRN

## 2020-12-03 MED ORDER — IODIXANOL 320 MG/ML IV SOLN
INTRAVENOUS | Status: DC | PRN
  Administered 2020-12-03: 104 mL via INTRA_ARTERIAL

## 2020-12-03 MED ORDER — ROCURONIUM BROMIDE 10 MG/ML (PF) SYRINGE
PREFILLED_SYRINGE | INTRAVENOUS | Status: DC | PRN
  Administered 2020-12-03: 50 mg via INTRAVENOUS

## 2020-12-03 MED ORDER — METOPROLOL TARTRATE 50 MG PO TABS
50.0000 mg | ORAL_TABLET | Freq: Every morning | ORAL | Status: DC
  Administered 2020-12-04: 50 mg via ORAL
  Filled 2020-12-03: qty 1

## 2020-12-03 MED ORDER — LACTATED RINGERS IV SOLN: INTRAVENOUS | Status: DC

## 2020-12-03 MED ORDER — MORPHINE SULFATE (PF) 2 MG/ML IV SOLN
2.0000 mg | INTRAVENOUS | Status: DC | PRN
  Administered 2020-12-03: 2 mg via INTRAVENOUS
  Filled 2020-12-03: qty 1

## 2020-12-03 MED ORDER — GUAIFENESIN-DM 100-10 MG/5ML PO SYRP: 15.0000 mL | ORAL_SOLUTION | ORAL | Status: DC | PRN

## 2020-12-03 MED ORDER — LEVOTHYROXINE SODIUM 50 MCG PO TABS
50.0000 ug | ORAL_TABLET | Freq: Every day | ORAL | Status: DC
  Administered 2020-12-04: 50 ug via ORAL
  Filled 2020-12-03 (×2): qty 1

## 2020-12-03 MED ORDER — PANTOPRAZOLE SODIUM 40 MG PO TBEC
40.0000 mg | DELAYED_RELEASE_TABLET | Freq: Every day | ORAL | Status: DC
  Administered 2020-12-04: 40 mg via ORAL
  Filled 2020-12-03: qty 1

## 2020-12-03 MED ORDER — FENTANYL CITRATE (PF) 250 MCG/5ML IJ SOLN
INTRAMUSCULAR | Status: AC
  Filled 2020-12-03: qty 5

## 2020-12-03 MED ORDER — ALUM & MAG HYDROXIDE-SIMETH 200-200-20 MG/5ML PO SUSP: 15.0000 mL | ORAL | Status: DC | PRN

## 2020-12-03 MED ORDER — PROPOFOL 10 MG/ML IV BOLUS
INTRAVENOUS | Status: DC | PRN
  Administered 2020-12-03: 20 mg via INTRAVENOUS
  Administered 2020-12-03: 50 mg via INTRAVENOUS
  Administered 2020-12-03: 30 mg via INTRAVENOUS

## 2020-12-03 MED ORDER — LIDOCAINE 2% (20 MG/ML) 5 ML SYRINGE
INTRAMUSCULAR | Status: DC | PRN
  Administered 2020-12-03: 40 mg via INTRAVENOUS

## 2020-12-03 MED ORDER — ACETAMINOPHEN 650 MG RE SUPP: 325.0000 mg | RECTAL | Status: DC | PRN

## 2020-12-03 MED ORDER — CEFAZOLIN SODIUM-DEXTROSE 2-4 GM/100ML-% IV SOLN
2.0000 g | INTRAVENOUS | Status: AC
  Administered 2020-12-03: 2 g via INTRAVENOUS
  Filled 2020-12-03: qty 100

## 2020-12-03 MED ORDER — CHLORHEXIDINE GLUCONATE 0.12 % MT SOLN
15.0000 mL | Freq: Once | OROMUCOSAL | Status: AC
  Administered 2020-12-03: 15 mL via OROMUCOSAL
  Filled 2020-12-03: qty 15

## 2020-12-03 MED ORDER — ACETAMINOPHEN 325 MG PO TABS: 325.0000 mg | ORAL_TABLET | ORAL | Status: DC | PRN

## 2020-12-03 MED ORDER — FENTANYL CITRATE (PF) 100 MCG/2ML IJ SOLN
INTRAMUSCULAR | Status: AC
  Filled 2020-12-03: qty 2

## 2020-12-03 MED ORDER — PROTAMINE SULFATE 10 MG/ML IV SOLN
INTRAVENOUS | Status: AC
  Filled 2020-12-03: qty 5

## 2020-12-03 MED ORDER — POLYETHYLENE GLYCOL 3350 17 G PO PACK: 17.0000 g | PACK | Freq: Every day | ORAL | Status: DC | PRN

## 2020-12-03 MED ORDER — SODIUM CHLORIDE 0.9 % IV SOLN: INTRAVENOUS | Status: DC

## 2020-12-03 MED ORDER — HEPARIN 6000 UNIT IRRIGATION SOLUTION
Status: AC
  Filled 2020-12-03: qty 500

## 2020-12-03 MED ORDER — ROCURONIUM BROMIDE 10 MG/ML (PF) SYRINGE
PREFILLED_SYRINGE | INTRAVENOUS | Status: AC
  Filled 2020-12-03: qty 10

## 2020-12-03 SURGICAL SUPPLY — 85 items
ADH SKN CLS APL DERMABOND .7 (GAUZE/BANDAGES/DRESSINGS) ×4
ADH SKN CLS LQ APL DERMABOND (GAUZE/BANDAGES/DRESSINGS) ×8
APL PRP STRL LF DISP 70% ISPRP (MISCELLANEOUS) ×4
APL SKNCLS STERI-STRIP NONHPOA (GAUZE/BANDAGES/DRESSINGS) ×4
BAG BANDED W/RUBBER/TAPE 36X54 (MISCELLANEOUS) ×1 IMPLANT
BAG COUNTER SPONGE SURGICOUNT (BAG) ×5 IMPLANT
BAG EQP BAND 135X91 W/RBR TAPE (MISCELLANEOUS) ×4
BAG SNAP BAND KOVER 36X36 (MISCELLANEOUS) ×1 IMPLANT
BAG SPNG CNTER NS LX DISP (BAG) ×4
BALLN ATLAS 14X40X75 (BALLOONS) ×5
BALLN MUSTANG 6X60X75 (BALLOONS) ×5
BALLN MUSTANG 7X60X75 (BALLOONS) ×5
BALLN MUSTANG 9X20X75 (BALLOONS) ×10
BALLOON ATLAS 14X40X75 (BALLOONS) IMPLANT
BALLOON MUSTANG 6X60X75 (BALLOONS) IMPLANT
BALLOON MUSTANG 7X60X75 (BALLOONS) IMPLANT
BALLOON MUSTANG 9X20X75 (BALLOONS) IMPLANT
BENZOIN TINCTURE PRP APPL 2/3 (GAUZE/BANDAGES/DRESSINGS) ×5 IMPLANT
CANISTER SUCT 3000ML PPV (MISCELLANEOUS) ×5 IMPLANT
CANNULA VESSEL 3MM 2 BLNT TIP (CANNULA) ×10 IMPLANT
CATH BEACON 5 .035 65 KMP TIP (CATHETERS) ×1 IMPLANT
CATH OMNI FLUSH .035X70CM (CATHETERS) ×1 IMPLANT
CATH OMNI FLUSH 5F 65CM (CATHETERS) ×5 IMPLANT
CHLORAPREP W/TINT 26 (MISCELLANEOUS) ×5 IMPLANT
CLIP LIGATING EXTRA SM BLUE (MISCELLANEOUS) ×1 IMPLANT
CLIP VESOCCLUDE MED 24/CT (CLIP) ×5 IMPLANT
CLIP VESOCCLUDE SM WIDE 24/CT (CLIP) ×5 IMPLANT
CLOSURE PERCLOSE PROSTYLE (VASCULAR PRODUCTS) ×2 IMPLANT
COVER DOME SNAP 22 D (MISCELLANEOUS) ×1 IMPLANT
DERMABOND ADHESIVE PROPEN (GAUZE/BANDAGES/DRESSINGS) ×2
DERMABOND ADVANCED (GAUZE/BANDAGES/DRESSINGS) ×1
DERMABOND ADVANCED .7 DNX12 (GAUZE/BANDAGES/DRESSINGS) ×4 IMPLANT
DERMABOND ADVANCED .7 DNX6 (GAUZE/BANDAGES/DRESSINGS) IMPLANT
DRAIN CHANNEL 15F RND FF W/TCR (WOUND CARE) IMPLANT
DRAPE HALF SHEET 40X57 (DRAPES) ×1 IMPLANT
ELECT REM PT RETURN 9FT ADLT (ELECTROSURGICAL) ×5
ELECTRODE REM PT RTRN 9FT ADLT (ELECTROSURGICAL) ×4 IMPLANT
EVACUATOR SILICONE 100CC (DRAIN) IMPLANT
GAUZE SPONGE 4X4 12PLY STRL (GAUZE/BANDAGES/DRESSINGS) ×5 IMPLANT
GLOVE SURG POLYISO LF SZ8 (GLOVE) ×5 IMPLANT
GOWN STRL REUS W/ TWL LRG LVL3 (GOWN DISPOSABLE) ×8 IMPLANT
GOWN STRL REUS W/ TWL XL LVL3 (GOWN DISPOSABLE) ×4 IMPLANT
GOWN STRL REUS W/TWL LRG LVL3 (GOWN DISPOSABLE) ×10
GOWN STRL REUS W/TWL XL LVL3 (GOWN DISPOSABLE) ×5
GRAFT VASC PATCH XENOSURE 1X14 (Vascular Products) ×1 IMPLANT
KIT BASIN OR (CUSTOM PROCEDURE TRAY) ×5 IMPLANT
KIT ENCORE 26 ADVANTAGE (KITS) IMPLANT
KIT MICROPUNCTURE NIT STIFF (SHEATH) ×2 IMPLANT
KIT TURNOVER KIT B (KITS) ×5 IMPLANT
NS IRRIG 1000ML POUR BTL (IV SOLUTION) ×10 IMPLANT
PACK ENDO MINOR (CUSTOM PROCEDURE TRAY) ×5 IMPLANT
PACK PERIPHERAL VASCULAR (CUSTOM PROCEDURE TRAY) ×5 IMPLANT
PAD ARMBOARD 7.5X6 YLW CONV (MISCELLANEOUS) ×10 IMPLANT
PENCIL SMOKE EVACUATOR (MISCELLANEOUS) IMPLANT
SET MICROPUNCTURE 5F STIFF (MISCELLANEOUS) ×4 IMPLANT
SHEATH BRITE TIP 7FRX11 (SHEATH) ×1 IMPLANT
SHEATH BRITE TIP 8FR 23CM (SHEATH) ×1 IMPLANT
SHEATH PINNACLE 5F 10CM (SHEATH) ×5 IMPLANT
SHEATH PINNACLE 8F 10CM (SHEATH) ×5 IMPLANT
STENT VIABAHN 7X50X120 (Permanent Stent) ×5 IMPLANT
STENT VIABAHN 7X5X120 7FR (Permanent Stent) IMPLANT
STENT VIABAHN VBX 11X39X135 (Permanent Stent) ×1 IMPLANT
STENT VIABAHN VBX 7X59X135 (Permanent Stent) ×2 IMPLANT
STOPCOCK MORSE 400PSI 3WAY (MISCELLANEOUS) ×6 IMPLANT
STRIP CLOSURE SKIN 1/2X4 (GAUZE/BANDAGES/DRESSINGS) ×5 IMPLANT
SUT ETHILON 3 0 PS 1 (SUTURE) IMPLANT
SUT MNCRL AB 4-0 PS2 18 (SUTURE) ×5 IMPLANT
SUT PROLENE 5 0 C 1 24 (SUTURE) ×7 IMPLANT
SUT PROLENE 6 0 BV (SUTURE) ×6 IMPLANT
SUT VIC AB 2-0 CT1 27 (SUTURE) ×5
SUT VIC AB 2-0 CT1 TAPERPNT 27 (SUTURE) ×4 IMPLANT
SUT VIC AB 3-0 SH 27 (SUTURE) ×5
SUT VIC AB 3-0 SH 27X BRD (SUTURE) ×4 IMPLANT
SYR MEDRAD MARK V 150ML (SYRINGE) ×5 IMPLANT
TOWEL GREEN STERILE (TOWEL DISPOSABLE) ×10 IMPLANT
TOWEL GREEN STERILE FF (TOWEL DISPOSABLE) ×5 IMPLANT
TUBING HIGH PRESSURE 120CM (CONNECTOR) ×5 IMPLANT
UNDERPAD 30X36 HEAVY ABSORB (UNDERPADS AND DIAPERS) ×5 IMPLANT
WATER STERILE IRR 1000ML POUR (IV SOLUTION) ×5 IMPLANT
WIRE AMPLATZ SS-J .035X180CM (WIRE) IMPLANT
WIRE AMPLATZ SS-J .035X260CM (WIRE) IMPLANT
WIRE BENTSON .035X145CM (WIRE) ×4 IMPLANT
WIRE G V18X300CM (WIRE) ×1 IMPLANT
WIRE ROSEN-J .035X260CM (WIRE) ×2 IMPLANT
WIRE STARTER BENTSON 035X150 (WIRE) ×2 IMPLANT

## 2020-12-03 NOTE — Progress Notes (Signed)
  Day of Surgery Note    Subjective:  Complaining of knee pain. Family at bedside.   Vitals:   12/03/20 1200 12/03/20 1235  BP: 131/71 (!) 120/47  Pulse: 77 64  Resp: 13 16  Temp:  (!) 97.3 F (36.3 C)  SpO2: 92% 93%   General appearance: Awake, alert in no apparent distress Cardiac: Heart rate and rhythm are regular Respirations: Nonlabored Incisions: Left groin incision is well approximated without bleeding. Some ecchymosis. Right groin is soft with palp fem pulse Extremities: Both feet are warm with intact sensation and motor function.   Pulse/Doppler exam: + dorsalis pedis, posterior tibial artery Doppler signals    Assessment/Plan:  This is a 86 y.o. female who is s/p left femoral endarterectomy, profundaplasty, and patch angioplasty, terminal aortic, right CIA, left CIA and left EIA stent placement via right CFA access. VSS. I applied knee break in bed which she said felt better on her knees.   Risa Grill, PA-C 12/03/2020 1:22 PM [phone]

## 2020-12-03 NOTE — Progress Notes (Signed)
Patient arrived to 4E24 from PACU. Bil. Groin sites level 0 and bilateral DP and PT dopplered.  CHG bath completed. Patient placed on telemetry and CCMD notified. Patient educated about bedrest and oriented to room. Bed low and locked with call bed left within reach.   Gailen Shelter RN

## 2020-12-03 NOTE — Progress Notes (Signed)
Pacu RN Report to floor given  Gave report to [employer]. [phone]. Discussed surgery, meds given in OR and Pacu, VS, IV fluids given, EBL, urine output, pain and other pertinent information. Also discussed if pt had any family or friends here or belongings with them.   Bilat groin surgical wounds, L leg to stay straight, pt to lay flat until 4pm. Report to [employer].  Pt exits my care.

## 2020-12-03 NOTE — Transfer of Care (Signed)
Immediate Anesthesia Transfer of Care Note  Patient: Jill Franco  Procedure(s) Performed: LEFT FEMORAL ENDARTERECTOMY AND PROFUNDAPLASTY (Left: Groin) AORTIC STENTING, INSERTION OF BILATERAL COMMON ILIAC STENTS, AND LEFT EXTERNAL ILIAC STENT (Left: Abdomen) ULTRASOUND GUIDANCE FOR VASCULAR ACCESS, RIGHT FEMORAL ARTERY (Right: Groin) ABODOMINAL ANGIOGRAM (Abdomen) PATCH ANGIOPLASTY OF LEFT COMMON FEMORAL ARTERY USING BOVINE PERICARDIUM PATCH (Left: Groin)  Patient Location: PACU  Anesthesia Type:General  Level of Consciousness: awake, alert , oriented and patient cooperative  Airway & Oxygen Therapy: Patient Spontanous Breathing and Patient connected to face mask oxygen  Post-op Assessment: Report given to RN, Post -op Vital signs reviewed and stable and Patient moving all extremities  Post vital signs: Reviewed and stable  Last Vitals:  Vitals Value Taken Time  BP 136/62 12/03/20 1129  Temp 36.3 C 12/03/20 1129  Pulse 71 12/03/20 1131  Resp 17 12/03/20 1131  SpO2 96 % 12/03/20 1131  Vitals shown include unvalidated device data.  Last Pain:  Vitals:   12/03/20 0647  TempSrc:   PainSc: 0-No pain         Complications: No notable events documented.

## 2020-12-03 NOTE — H&P (Signed)
[facility]   ASSESSMENT / PLAN: Jill Franco is a 86 y.o. female with atherosclerosis of native and stented aortoiliac occlusive disease as well as bilateral lower extremity arteries causing causing disabling claudication.  Patient counseled patients with asymptomatic peripheral arterial disease or claudication have a 1-2% risk of developing chronic limb threatening ischemia, but a 15-30% risk of mortality in the next 5 years. Intervention should only be considered for medically optimized patients with disabling symptoms. .  Recommend the following which can slow the progression of atherosclerosis and reduce the risk of major adverse cardiac / limb events:  Complete cessation from all tobacco products. Blood glucose control with goal A1c < 7%. Blood pressure control with goal blood pressure < 140/90 mmHg. Lipid reduction therapy with goal LDL-C <100 mg/dL (<70 if symptomatic from PAD).  Aspirin 81mg  PO QD.  Atorvastatin 40-80mg  PO QD (or other "high intensity" statin therapy).  Plan left femoral endarterectomy and iliac stenting today in OR.  CHIEF COMPLAINT: Calf pain with walking  HISTORY OF PRESENT ILLNESS: Jill Franco is a 86 y.o. female who returns to clinic for surveillance of peripheral arterial disease.  She reports deteriorating claudication symptoms.  She is now disabled by her symptoms.  She can only walk a short distance before severe pain sets in bilateral calves.  She has no wounds about her feet.  She occasionally has pain in her foot at rest, but does not report symptoms typical of ischemic rest pain.  She continues to smoke, but is working hard to quit.  She is using patches with some success.  10/27/20: patient returns after evaluation by Dr. Einar Gip. Pending stress test and echo. No interval changes.  12/03/20: Patient presents for surgery. All questions answered.  VASCULAR SURGICAL HISTORY:  left common iliac stent (6 x 40), terminal  aortic stent (10 x 19) 03/18/16 ([place])  VASCULAR RISK FACTORS: Positive history of stroke / transient ischemic attack. Negative history of coronary artery disease.  Negative history of diabetes mellitus. Last A1c 5.7. Positive history of smoking. Not actively smoking. Positive history of hypertension.  No reported history of chronic kidney disease but last GFR 58-73. CKD stage 2 - 3a. Negative history of chronic obstructive pulmonary disease, but patient has been treated with inhalers with symptomatic relief.   FUNCTIONAL STATUS: ECOG performance status: (1) Restricted in physically strenuous activity, ambulatory and able to do work of light nature Ambulatory status: Ambulatory within the community with limits  Past Medical History:  Diagnosis Date   Cataract    bilaterally corrected.   COPD (chronic obstructive pulmonary disease) ([place])    CVA (cerebral vascular accident) ([place])    Pt was unaware and does not know when it happened.  Was told after some testing was done.   Double vision    Dyspnea    Facial droop 10/2017   left   History of shingles 2012   HLD (hyperlipidemia)    HTN (hypertension)    Hypothyroidism    PVD (peripheral vascular disease) ([place])    Vision loss    temporary    Past Surgical History:  Procedure Laterality Date   ABDOMINAL EXPLORATION SURGERY  1950s   "they thought I was pregnant; went in to explore"   APPENDECTOMY  childhood   BACK SURGERY     BREAST LUMPECTOMY Left 1960's   benign   carotid ultrasound  06/19/2007   0-39% stenosis   CATARACT EXTRACTION W/ INTRAOCULAR LENS IMPLANT Left 09/2016  CATARACT EXTRACTION W/ INTRAOCULAR LENS IMPLANT Right 10/2016   COLONOSCOPY WITH PROPOFOL N/A 04/08/2019   Procedure: COLONOSCOPY WITH PROPOFOL;  Surgeon: Milus Banister, MD;  Location: [facility];  Service: Endoscopy;  Laterality: N/A;   DILATION AND CURETTAGE OF UTERUS  1960's X 3   miscarriages   DOPPLER ECHOCARDIOGRAPHY  06/19/2007   Normal EF  55-70%   ILIAC ARTERY STENT Left 03/18/2016   common iliac stent (6 x 40), aortic stent (10 x 19)/notes 03/18/2016   LAMINECTOMY AND MICRODISCECTOMY SPINE  1960's X 2   MOLE REMOVAL  07/10/2016   NSVD     x1   PERIPHERAL VASCULAR CATHETERIZATION N/A 03/18/2016   Procedure: Abdominal Aortogram w/Lower Extremity;  Surgeon: Elam Dutch, MD;  Location: [place] [facility] [facility];  Service: Cardiovascular;  Laterality: N/A;   PERIPHERAL VASCULAR CATHETERIZATION  03/18/2016   Procedure: Peripheral Vascular Intervention;  Surgeon: Elam Dutch, MD;  Location: [place] [facility];  Service: Cardiovascular;;   POLYPECTOMY  04/08/2019   Procedure: POLYPECTOMY;  Surgeon: Milus Banister, MD;  Location: [facility];  Service: Endoscopy;;    Family History  Problem Relation Age of Onset   Stroke Father 31   Cancer Brother    Colon cancer Neg Hx    Breast cancer Neg Hx    Colon polyps Neg Hx    Esophageal cancer Neg Hx    Rectal cancer Neg Hx    Stomach cancer Neg Hx     Social History   Socioeconomic History   Marital status: Widowed    Spouse name: Not on file   Number of children: 1   Years of education: Not on file   Highest education level: Not on file  Occupational History   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     Her daughter is helping at home some (some as of 2022).  Her daughter has  vision loss and is widowed as of 2022 and moved in with patient.   Social Determinants of Health   Financial Resource Strain: Not on file  Food Insecurity: Not on file  Transportation Needs: Not on file  Physical Activity: Not on file  Stress: Not on file  Social Connections: Not on file  Intimate Partner Violence: Not on file    Allergies  Allergen Reactions   Aspirin Other (See Comments)    REACTION: Uncoated Stomach upset on empty stomach   Codeine Nausea Only and Other (See Comments)    Pill needs to be E.coated   Ibuprofen Nausea And Vomiting   Lipitor [Atorvastatin] Other (See Comments)    myalgia   Other Itching and Rash    Nicotine patches     Current Facility-Administered Medications  Medication  Dose Route Frequency Provider Last Rate Last Admin   0.9 %  sodium chloride infusion   Intravenous Continuous [name], MD       ceFAZolin (ANCEF) IVPB 2g/100 mL premix  2 g Intravenous 30 min Pre-Op [name], MD       Chlorhexidine Gluconate Cloth 2 % PADS 6 each  6 each Topical Once [name], MD       And   Chlorhexidine Gluconate Cloth 2 % PADS 6 each  6 each Topical Once [name], MD       lactated ringers infusion   Intravenous Continuous Woodrum, Chelsey L, MD        REVIEW OF SYSTEMS:  [X]  denotes positive finding, [ ]  denotes negative finding Cardiac  Comments:  Chest pain or chest pressure:    Shortness of breath upon exertion:    Short of breath when lying flat:    Irregular heart rhythm:        Vascular    Pain in calf, thigh, or hip brought on by ambulation:    Pain in feet at night that wakes you up from your sleep:     Blood clot in your veins:    Leg swelling:         Pulmonary    Oxygen at home:    Productive cough:     Wheezing:         Neurologic    Sudden weakness in arms or legs:     Sudden numbness in arms or legs:     Sudden onset of difficulty speaking or slurred speech:    Temporary loss of vision in  one eye:     Problems with dizziness:         Gastrointestinal    Blood in stool:     Vomited blood:         Genitourinary    Burning when urinating:     Blood in urine:        Psychiatric    Major depression:         Hematologic    Bleeding problems:    Problems with blood clotting too easily:        Skin    Rashes or ulcers:        Constitutional    Fever or chills:      PHYSICAL EXAM Vitals:   12/03/20 0601  BP: (!) 146/60  Pulse: (!) 58  Resp: 17  Temp: 97.6 F (36.4 C)  TempSrc: Oral  SpO2: 100%  Weight: 45.8 kg  Height: 5\' 4"  (1.626 m)     Constitutional: well appearing. no distress. Thin. Appears marginally nourished.  Neurologic: CN intact. no focal findings. no sensory loss. Psychiatric:  Mood and affect symmetric and appropriate. Eyes:  No icterus. No conjunctival pallor. Ears, nose, throat:  mucous membranes moist. Midline trachea.  Cardiac: regular rate and rhythm.  Respiratory:  unlabored. Abdominal:  soft, non-tender, non-distended.  Peripheral vascular: absent left femoral pulse Extremity: no edema. no cyanosis. no pallor.  Skin: no gangrene. no ulceration. Atrophic skin. Lymphatic: no Stemmer's sign. no palpable lymphadenopathy.  PERTINENT LABORATORY AND RADIOLOGIC DATA  Most recent CBC CBC Latest Ref Rng & Units 11/25/2020 07/02/2020 06/05/2020  WBC 4.0 - 10.5 K/uL 8.0 7.6 9.4  Hemoglobin 12.0 - 15.0 g/dL 15.9(H) 15.9(H) 15.2(H)  Hematocrit 36.0 - 46.0 % 48.1(H) 46.0 44.2  Platelets 150 - 400 K/uL 205 200.0 123(L)  Most recent CMP CMP Latest Ref Rng & Units 11/25/2020 07/02/2020 06/16/2020  Glucose 70 - 99 mg/dL 115(H) 208(H) 162(H)  BUN 8 - 23 mg/dL 14 21 15   Creatinine 0.44 - 1.00 mg/dL 0.90 0.91 0.72  Sodium 135 - 145 mmol/L 134(L) 135 132(L)  Potassium 3.5 - 5.1 mmol/L 3.9 4.2 4.3  Chloride 98 - 111 mmol/L 96(L) 95(L) 95(L)  CO2 22 - 32 mmol/L 30 34(H) 32  Calcium 8.9 - 10.3 mg/dL 9.6 9.3 8.7  Total Protein 6.5 - 8.1 g/dL 7.2  6.9 6.0  Total Bilirubin 0.3 - 1.2 mg/dL 0.7 0.5 0.7  Alkaline Phos 38 - 126 U/L 69 99 167(H)  AST 15 - 41 U/L 29 20 49(H)  ALT 0 - 44 U/L 23 21 68(H)    Renal function Estimated Creatinine Clearance: 33.6 mL/min (by C-G formula based on SCr of 0.9 mg/dL).  Hgb A1c MFr Bld (%)  Date Value  09/18/2020 5.7    LDL Cholesterol  Date Value Ref Range Status  09/18/2020 84 0 - 99 mg/dL Final   Direct LDL  Date Value Ref Range Status  06/19/2012 115.8 mg/dL Final    Comment:    Optimal:  <100 mg/dLNear or Above Optimal:  100-129 mg/dLBorderline High:  130-159 mg/dLHigh:  160-189 mg/dLVery High:  >190 mg/dL     CLINICAL DATA:  86 year old female with history of aorto bi-iliac occlusive disease status post distal aortic and left common iliac stent placement in 2018.   EXAM: CT ANGIOGRAPHY OF ABDOMINAL AORTA WITH ILIOFEMORAL RUNOFF   TECHNIQUE: Multidetector CT imaging of the abdomen, pelvis and lower extremities was performed using the standard protocol during bolus administration of intravenous contrast. Multiplanar CT image reconstructions and MIPs were obtained to evaluate the vascular anatomy.   CONTRAST:  160mL ISOVUE-370 IOPAMIDOL (ISOVUE-370) INJECTION 76%   COMPARISON:  04/05/2019   FINDINGS: VASCULAR   Aorta: Patent and normal caliber throughout. Circumferential fibrofatty and calcific atherosclerotic calcifications, most prominent distally. Patent indwelling distal infrarenal aortic stent.   Celiac: Severe ostial stenosis secondary to atherosclerotic plaque. Mild poststenotic ectasia. Patent distally.   SMA: Patent ostium with an approximately 7 cm segment of prominent atherosclerotic plaque resulting in multifocal mild to severe stenoses. Patent distal jejunal and colonic branches.   Renals: Single bilateral renal arteries are patent. At least mild proximal left renal artery stenosis secondary to atherosclerotic plaque.   IMA: Ostial occlusion secondary  to atherosclerotic plaque. Distal reconstitution.   RIGHT Lower Extremity   Inflow: Circumferential fibrofatty and calcific atherosclerotic plaque about the common iliac artery with at least moderate focal stenosis in its midportion. The internal iliac artery is heavily calcified but appears patent. Diminutive but patent external iliac artery with scattered atherosclerotic calcifications, likely multiple at least mild stenoses.   Outflow: Common femoral artery and profunda are patent. The superficial femoral artery is patent in its proximal 4 cm followed by chronic total occlusion to the level of Hunter's canal. There is distal reconstitution of the patent popliteal artery.   Runoff: Patent three vessel runoff to the ankle.   LEFT Lower Extremity   Inflow: The common iliac artery is patent with patent indwelling stent in unchanged position. Circumferential atherosclerotic calcifications. Severe ostial stenosis of the internal iliac artery which is patent distally. Severe focal stenosis about the proximal external iliac artery which is patent but diminutive distally with circumferential atherosclerotic calcifications.   Outflow: Common femoral artery is patent with multifocal at least mild stenosis secondary to atherosclerotic plaque. The  profundus patent. Diffuse calcifications throughout the superficial femoral artery resulting in at least mild multifocal stenoses to the level of Hunter's canal. The popliteal artery is patent.   Runoff: Patent three vessel runoff to the ankle.   Veins: No obvious venous abnormality within the limitations of this arterial phase study.   Review of the MIP images confirms the above findings.   NON-VASCULAR   Lower chest: Unchanged left lower lobe solid pulmonary nodule measuring up to 7 mm. Similar appearing right middle and lingular subpleural scarring. Mild global cardiomegaly. No pericardial effusion.   Hepatobiliary: No focal liver  abnormality is seen. No gallstones, gallbladder wall thickening, or biliary dilatation.   Pancreas: Unremarkable. No pancreatic ductal dilatation or surrounding inflammatory changes.   Spleen: Normal in size without focal abnormality.   Adrenals/Urinary Tract: Similar appearing diffuse thickening of the left adrenal gland. The right adrenal gland is within normal limits. Kidneys are normal, without renal calculi, focal lesion, or hydronephrosis. Bladder is unremarkable.   Stomach/Bowel: Stomach is within normal limits. Appendix is not definitively visualized. No evidence of bowel wall thickening, distention, or inflammatory changes.   Lymphatic: No abdominopelvic lymphadenopathy.   Reproductive: Uterus and bilateral adnexa are unremarkable.   Other: No abdominal wall hernia or abnormality. No abdominopelvic ascites.   Musculoskeletal: Multilevel discogenic degenerative changes of the lumbar spine, most pronounced at L4-L5 and L5-S1. Osteopenia. No acute osseous abnormality.   IMPRESSION: VASCULAR   1. Patent indwelling distal aortic and left common iliac artery stents. Aortic Atherosclerosis (ICD10-I70.0). 2. 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 3. Atherosclerotic plaque about the bifurcation of the distal left common iliac artery resulting in severe stenosis of the proximal left internal and external iliac arteries, each patent distally but diminutive with circumferential atherosclerotic calcifications. 4. High degree of atherosclerotic burden and likely at least mild multifocal stenoses of the left lower extremity outflow vessels. 5. Patent 3 vessel bilateral lower extremity runoff.   NON-VASCULAR   No acute abdominopelvic abnormality.   Ruthann Cancer, MD   [facility]   [facility]     Electronically Signed   By:  Ruthann Cancer MD   On: 06/29/2020 13:34  Vascular Imaging: LOWER EXTREMITY DOPPLER STUDY   Patient Name:  Jill Franco  Date of Exam:   10/06/2020  Medical Rec #: [id number]         Accession #:    [id number]  Date of Birth: 1934-12-03         Patient Gender: F  Patient Age:   21 years  Exam Location:  [facility]  Procedure:      VAS [place] ABI WITH/WO TBI  Referring Phys: Juanda Crumble FIELDS    ---------------------------------------------------------------------------  -----     Indications: Claudication, and peripheral artery disease.   High Risk Factors: Hypertension, hyperlipidemia, past history of smoking.   Other Factors: Patient complains of constant lower extremity pain, appears  to be                 getting worse   Vascular Interventions: Left CIA and aortic stent 03/18/2016.   Comparison Study: Increase in toe-brachial indeces since prior exam of  4/282022   Performing Technologist: Alvia Grove RVT      Examination Guidelines: A complete evaluation includes at minimum, Doppler  waveform signals and systolic blood pressure reading at the level of  bilateral  brachial, anterior tibial, and posterior tibial arteries, when vessel  segments  are accessible. Bilateral testing is considered an integral part of a  complete  examination. Photoelectric Plethysmograph (PPG) waveforms and toe systolic  pressure readings are included as required and additional duplex testing  as  needed. Limited examinations for reoccurring indications may be performed  as  noted.      ABI Findings:  +---------+------------------+-----+----------+--------+  Right    Rt Pressure (mmHg)IndexWaveform  Comment   +---------+------------------+-----+----------+--------+  Brachial 162                                        +---------+------------------+-----+----------+--------+  PTA      105               0.64 monophasic           +---------+------------------+-----+----------+--------+  DP       98                0.59 monophasic          +---------+------------------+-----+----------+--------+  Great Toe64                0.39 Abnormal            +---------+------------------+-----+----------+--------+   +---------+------------------+-----+----------+-------+  Left     Lt Pressure (mmHg)IndexWaveform  Comment  +---------+------------------+-----+----------+-------+  Brachial 165                                       +---------+------------------+-----+----------+-------+  PTA      89                0.54 monophasic         +---------+------------------+-----+----------+-------+  DP       70                0.42 monophasic         +---------+------------------+-----+----------+-------+  Great Toe42                0.25 Abnormal           +---------+------------------+-----+----------+-------+   +-------+-----------+-----------+------------+------------+  ABI/TBIToday's ABIToday's TBIPrevious ABIPrevious TBI  +-------+-----------+-----------+------------+------------+  Right  0.64       0.39       0.58        absent        +-------+-----------+-----------+------------+------------+  Left   0.54       0.25       0.68        absent        +-------+-----------+-----------+------------+------------+             Summary:  Right: Resting right ankle-brachial index indicates moderate right lower  extremity arterial disease. The right toe-brachial index is abnormal.   Left: Resting left ankle-brachial index indicates moderate left lower  extremity arterial disease. The left toe-brachial index is abnormal.       *See table(s) above for measurements and observations.       Electronically signed by Jamelle Haring on 10/06/2020 at 10:11:55 AM.   ABDOMINAL AORTA STUDY   Patient Name:  Jill Franco Mask  Date of Exam:   10/06/2020  Medical Rec #:  [id number]         Accession #:    [id number]  Date of Birth: 1935-01-05  Patient Gender: F  Patient Age:   23 years  Exam Location:  [facility]  Procedure:      VAS US AORTA/IVC/ILIACS  Referring Phys: CHARLES FIELDS    ---------------------------------------------------------------------------  -----     Risk Factors: Hypertension, hyperlipidemia, past history of smoking.   Vascular Interventions: Left CIA and aortic stent 03/18/2016.   Limitations: Air/bowel gas. Calcific plaque noted throughout.      Performing Technologist: Alvia Grove RVT      Examination Guidelines: A complete evaluation includes B-mode imaging,  spectral  Doppler, color Doppler, and power Doppler as needed of all accessible  portions  of each vessel. Bilateral testing is considered an integral part of a  complete  examination. Limited examinations for reoccurring indications may be  performed  as noted.      Abdominal Aorta Findings:  +-------------+-------+----------+----------+----------+--------+--------+  Location     AP (cm)Trans (cm)PSV (cm/s)Waveform  ThrombusComments  +-------------+-------+----------+----------+----------+--------+--------+  Proximal                      47                                    +-------------+-------+----------+----------+----------+--------+--------+  Mid                           38                                    +-------------+-------+----------+----------+----------+--------+--------+  Distal                        120                                   +-------------+-------+----------+----------+----------+--------+--------+  RT CIA Prox                   117       monophasic                  +-------------+-------+----------+----------+----------+--------+--------+  RT CIA Mid                    280       monophasic                   +-------------+-------+----------+----------+----------+--------+--------+  RT CIA Distal                 128       monophasic                  +-------------+-------+----------+----------+----------+--------+--------+  RT EIA Prox                                               NV        +-------------+-------+----------+----------+----------+--------+--------+  RT EIA Mid                    137       monophasic                  +-------------+-------+----------+----------+----------+--------+--------+  RT EIA Distal                 125       monophasic                  +-------------+-------+----------+----------+----------+--------+--------+  LT CIA Prox                                               stent     +-------------+-------+----------+----------+----------+--------+--------+  LT CIA Mid                                                stent     +-------------+-------+----------+----------+----------+--------+--------+  LT CIA Distal                                             stent     +-------------+-------+----------+----------+----------+--------+--------+  LT EIA Prox                   459       monophasic                  +-------------+-------+----------+----------+----------+--------+--------+  LT EIA Mid                    66        monophasic                  +-------------+-------+----------+----------+----------+--------+--------+  LT EIA Distal                 85        monophasic                  +-------------+-------+----------+----------+----------+--------+--------+   Right CFA 47 cm.s monophasic waveform. Left CFA 41 cm/s monophasic  waveform.   Left Stent(s):  +---------------+--------+---------------+----------+---------------------+   Left CIA stent PSV cm/sStenosis       Waveform  Comments                 +---------------+--------+---------------+----------+---------------------+   Prox to Stent  149                    monophasic                        +---------------+--------+---------------+----------+---------------------+   Proximal Stent 113                    monophasic                        +---------------+--------+---------------+----------+---------------------+   Mid Stent      130                    monophasic                        +---------------+--------+---------------+----------+---------------------+   Distal Stent   144                    monophasiclimited  visualization  +---------------+--------+---------------+----------+---------------------+   Distal to  [id number]     50-99% stenosisstenotic  limited  visualization  +---------------+--------+---------------+----------+---------------------+                   Summary:  IVC/Iliac: Suboptimal exam.   - Greater than 50% stenosis in the right CIA  - 50 - 99% stenosis distal to stent and proximal EIA, limited  visualization    NOTE: Further imaging may be warranted.     *See table(s) above for measurements and observations.     Electronically signed by Jamelle Haring on 10/06/2020 at 10:11:37 AM.   Yevonne Aline. Stanford Breed, MD [facility] Phone Number: [phone] 12/03/2020 7:12 AM

## 2020-12-03 NOTE — Progress Notes (Signed)
[facility] Progress Note  Labs sent from [facility].

## 2020-12-03 NOTE — Anesthesia Postprocedure Evaluation (Signed)
Anesthesia Post Note  Patient: Jill Franco  Procedure(s) Performed: LEFT FEMORAL ENDARTERECTOMY AND PROFUNDAPLASTY (Left: Groin) AORTIC STENTING, INSERTION OF BILATERAL COMMON ILIAC STENTS, AND LEFT EXTERNAL ILIAC STENT (Left: Abdomen) ULTRASOUND GUIDANCE FOR VASCULAR ACCESS, RIGHT FEMORAL ARTERY (Right: Groin) ABODOMINAL ANGIOGRAM (Abdomen) PATCH ANGIOPLASTY OF LEFT COMMON FEMORAL ARTERY USING BOVINE PERICARDIUM PATCH (Left: Groin)     Patient location during evaluation: PACU Anesthesia Type: General Level of consciousness: awake and alert Pain management: pain level controlled Vital Signs Assessment: post-procedure vital signs reviewed and stable Respiratory status: spontaneous breathing, nonlabored ventilation, respiratory function stable and patient connected to nasal cannula oxygen Cardiovascular status: blood pressure returned to baseline and stable Postop Assessment: no apparent nausea or vomiting Anesthetic complications: no   No notable events documented.  Last Vitals:  Vitals:   12/03/20 1200 12/03/20 1235  BP: 131/71 (!) 120/47  Pulse: 77 64  Resp: 13 16  Temp:  (!) 36.3 C  SpO2: 92% 93%    Last Pain:  Vitals:   12/03/20 1235  TempSrc:   PainSc: 3                  [name]

## 2020-12-03 NOTE — Anesthesia Procedure Notes (Signed)
Arterial Line Insertion Start/End10/13/2022 8:00 AM, 12/03/2020 8:10 AM Performed by: Freddrick March, MD, anesthesiologist  Patient location: OR. Preanesthetic checklist: patient identified, IV checked, site marked, risks and benefits discussed, surgical consent, monitors and equipment checked and pre-op evaluation Patient sedated Right, radial was placed Catheter size: 20 G Hand hygiene performed  and maximum sterile barriers used  Allen's test indicative of satisfactory collateral circulation Attempts: 2 Procedure performed using ultrasound guided technique. Ultrasound Notes:anatomy identified, needle tip was noted to be adjacent to the nerve/plexus identified and no ultrasound evidence of intravascular and/or intraneural injection Following insertion, Biopatch and dressing applied. Post procedure assessment: normal  Post procedure complications: second provider assisted. Patient tolerated the procedure well with no immediate complications. Additional procedure comments: First attempt by CRNA, second by MD.

## 2020-12-03 NOTE — Op Note (Signed)
DATE OF SERVICE: 12/03/2020  PATIENT:  Jill Franco  86 y.o. female  PRE-OPERATIVE DIAGNOSIS:  aortoiliac occlusive disease and left lower extremity atherosclerosis causing ischemic rest pain  POST-OPERATIVE DIAGNOSIS:  Same  PROCEDURE:   1) US guided right common femoral artery access 2) Aortogram and pelvic angiogram 3) Terminal aortic stenting (11x2mm VBX post dilated to ~59mm) 4) Right common iliac artery stenting (7x40mm VBX post dilated to 17mm proximally) 5) Left common iliac artery stenting (7x69mm VBX post dilated to 60mm proximally) 6) left external iliac artery stenting (7x38mm viabahn) 7) left femoral endarterectomy, profundaplasty, and patch angioplasty (bovine pericardium)  SURGEON:  Surgeon(s) and Role:    * [name], MD - Primary    * Broadus John, MD - Assisting  ASSISTANT: Cassandria Santee, MD  An assistant was required to facilitate exposure and expedite the case.  ANESTHESIA:   general  EBL: 138mL  BLOOD ADMINISTERED:none  DRAINS: none   LOCAL MEDICATIONS USED:  NONE  SPECIMEN:  none  COUNTS: confirmed correct.  TOURNIQUET:  none  PATIENT DISPOSITION:  PACU - hemodynamically stable.   Delay start of Pharmacological VTE agent (>24hrs) due to surgical blood loss or risk of bleeding: no  INDICATION FOR PROCEDURE: Jill Franco is a 86 y.o. female with aortoiliac occlusive disease and left lower extremity atherosclerosis causing ischemic rest pain. The patient previously underwent terminal aortic and left common iliac artery stenting with my partner Dr. Oneida Alar in 2018.  She has had progression of symptoms since that time and has developed disabling claudication which has now progressed to ischemic rest pain. After careful discussion of risks, benefits, and alternatives the patient was offered left femoral endarterectomy with iliac artery stenting. The patient understood and wished to proceed.  OPERATIVE FINDINGS: Good technical result from  complete endovascular reconstruction of aortic bifurcation.  Left external iliac stenting needed as well.  Good technical result from femoral endarterectomy and profundoplasty.  Strong Doppler flow in the feet at completion.  DESCRIPTION OF PROCEDURE: After identification of the patient in the pre-operative holding area, the patient was transferred to the operating room. The patient was positioned supine on the operating room table. Anesthesia was induced. The abdomen, groins, and thighs were prepped and draped in standard fashion. A surgical pause was performed confirming correct patient, procedure, and operative location.  A longitudinal incision was made over the left common femoral artery and femoral bifurcation.  The incision was carried down through subcutaneous tissue using Bovie electrocautery until the femoral sheath was encountered.  The femoral sheath was entered sharply.  The femoral arteries were exposed and encircled with Silastic Vesseloops.  Branches off of the common femoral artery were clipped.  Exposure was carried cranially on the external iliac artery under the inguinal ligament.  Soft area of external iliac artery was appreciated.  The left common femoral artery was accessed with micropuncture technique under direct visualization and a soft area of common femoral artery.  Using fluoroscopic guidance, a Bentson wire was navigated into the terminal aorta.  Over the wire an 8 [place] sheath was introduced into the common femoral artery and external iliac artery.  The right common femoral artery was accessed with micropuncture technique using ultrasound guidance.  Using fluoroscopic guidance a Bentson wire was navigated into the terminal aorta.  Over the wire a 7 French sheath was introduced into the common femoral artery and external iliac artery.  The patient was systemically heparinized.  Activated clotting time measurements were used throughout  the case to confirm adequate  anticoagulation.  Heparin was redosed as needed to keep an ACT above 250.  Angioplasty of the left common and external iliac arteries was performed to facilitate entry of our covered terminal aortic stent.  We selected an 11 x 39 mm VBX and deployed this in the terminal aorta under fluoroscopic guidance.  This was deployed within the existing, undersized 10 mm VBX stent.  We postdilated the 11 mm stent to approximately 14 mm using bilateral 9 mm balloons.  7 x 59 mm VBX stents were introduced into the terminal aortic stent and positioned to traverse the area of common iliac artery stenosis.  These were deployed in standard fashion simultaneously.  9 mm Mustang balloons were then used to post dilate the "kissing" portion of the common iliac artery stents within the previous aortic stent.  Left external iliac artery stenosis was noted past the bifurcation of the iliac artery and in the midportion of the external iliac artery a 7 x 50 mm Viabahn was deployed in standard fashion across the proximal external iliac artery treating both of these stenoses.  The stent was postdilated with a 6 x 60 mm Mustang balloon.  Completion angiography was performed antegrade and retrograde.  Completion angiography revealed resolution of the aortoiliac occlusive disease.  Clamps were applied to the left external iliac artery, superficial femoral artery, and to profundofemoral arteries.  Endovascular equipment was removed from the left femoral access.  The arteriotomy was enlarged with Potts scissors longitudinally.  An endarterectomy was performed with a Primary school teacher using standard technique.  The plaque appeared nearly occlusive just prior to the bifurcation of the common femoral artery.  A satisfactory plane was developed and all loose debris removed from the endarterectomy plane.  We confirmed excellent backbleeding from the common femoral arteries branches.  We confirmed torrential inflow.  A bovine pericardial patch was  brought in the field and prepared as per the manufacturer's instructions.  The patch was sewn to the arteriotomy using continuous running suture of 5-0 Prolene.  Immediately prior to completion the angioplasty was flushed and de-aired.  The patch was completed.  Clamps were released on the external iliac and superficial femoral and profunda femoris arteries.  The palpable pulse was noted throughout the repair.  2 areas of leak were repaired with simple repair suture.  Hemostasis was achieved in the patch angioplasty.  Through the right common femoral artery access and aortogram was performed to evaluate the patch angioplasty this revealed no technical defects and excellent flow into the superficial femoral artery and profunda femoris arteries.  A Perclose device was used to close the left common femoral artery access site.  Immediately prior to deployment a sheath a gram was performed with a micropuncture sheath at the access site to ensure there were no complications.  No issues were visualized.  The Perclose suture was secured and hemostasis was achieved.  Heparin was reversed with protamine.  Hemostasis was again firmed in the patch angioplasty in the surgical bed and the right common femoral artery access site.  The left femoral artery exposure was closed in layers using 2-0 Vicryl, 3-0 Vicryl, 4-0 Monocryl.  Dermabond was applied to the access site on the right and the incision on the left.  Doppler flow was confirmed in the feet at completion.  Upon completion of the case instrument and sharps counts were confirmed correct. The patient was transferred to the PACU in good condition. I was present for all portions of the  procedure.  Yevonne Aline. Stanford Breed, MD [facility] Phone Number: [phone] 12/03/2020 11:31 AM

## 2020-12-03 NOTE — Anesthesia Procedure Notes (Signed)
Procedure Name: Intubation Date/Time: 12/03/2020 7:47 AM Performed by: Myna Bright, CRNA Pre-anesthesia Checklist: Patient identified, Emergency Drugs available, Suction available and Patient being monitored Patient Re-evaluated:Patient Re-evaluated prior to induction Oxygen Delivery Method: Circle system utilized Preoxygenation: Pre-oxygenation with 100% oxygen Induction Type: IV induction Ventilation: Mask ventilation without difficulty Laryngoscope Size: Mac and 3 Grade View: Grade I Tube type: Oral Tube size: 7.0 mm Number of attempts: 1 Airway Equipment and Method: Stylet Placement Confirmation: ETT inserted through vocal cords under direct vision, positive ETCO2 and breath sounds checked- equal and bilateral Secured at: 21 cm Tube secured with: Tape Dental Injury: Teeth and Oropharynx as per pre-operative assessment

## 2020-12-03 NOTE — Discharge Instructions (Addendum)
[facility]  Discharge instructions  Lower Extremity Bypass Surgery  Please refer to the following instruction for your post-procedure care. Your surgeon or physician assistant will discuss any changes with you.  Activity  You are encouraged to walk as much as you can. You can slowly return to normal activities during the month after your surgery. Avoid strenuous activity and heavy lifting until your doctor tells you it's OK. Avoid activities such as vacuuming or swinging a golf club. Do not drive until your doctor give the OK and you are no longer taking prescription pain medications. It is also normal to have difficulty with sleep habits, eating and bowel movement after surgery. These will go away with time.  Bathing/Showering  You may shower after you go home. Do not soak in a bathtub, hot tub, or swim until the incision heals completely.  Incision Care  Clean your incision with mild soap and water. Shower every day. Pat the area dry with a clean towel. You do not need a bandage unless otherwise instructed. Do not apply any ointments or creams to your incision. If you have open wounds you will be instructed how to care for them or a visiting nurse may be arranged for you. If you have staples or sutures along your incision they will be removed at your post-op appointment. You may have skin glue on your incision. Do not peel it off. It will come off on its own in about one week. If you have a great deal of moisture in your groin, use a gauze help keep this area dry.  Wash the groin wound with soap and water daily and pat dry. (No tub bath-only shower)  Then put a dry gauze or washcloth there to keep this area dry daily and as needed.  Do not use Vaseline or neosporin on your incisions.  Only use soap and water on your incisions and then protect and keep dry.   Diet  Resume your normal diet. There are no special food restrictions following this procedure.  A low fat/ low cholesterol diet is recommended for all patients with vascular disease. In order to heal from your surgery, it is CRITICAL to get adequate nutrition. Your body requires vitamins, minerals, and protein. Vegetables are the best source of vitamins and minerals. Vegetables also provide the perfect balance of protein. Processed food has little nutritional value, so try to avoid this.  Medications  Resume taking all your medications unless your doctor or nurse practitioner tells you not to. If your incision is causing pain, you may take over-the-counter pain relievers such as acetaminophen (Tylenol). If you were prescribed a stronger pain medication, please aware these medication can cause nausea and constipation. Prevent nausea by taking the medication with a snack or meal. Avoid constipation by drinking plenty of fluids and eating foods with high amount of fiber, such as fruits, vegetables, and grains. Take Colase 100 mg (an over-the-counter stool softener) twice a day as needed for constipation. Do not take Tylenol if you are taking prescription pain medications.  Follow Up  Our office will schedule a follow up appointment 2-3 weeks following discharge.  Please call us immediately for any of the following conditions  Severe or worsening pain in your legs or feet while at rest or while walking Increase pain, redness, warmth, or drainage (pus) from your incision site(s) Fever of 101 degree or higher The swelling in your leg with the bypass suddenly worsens and becomes more painful  than when you were in the hospital If you have been instructed to feel your graft pulse then you should do so every day. If you can no longer feel this pulse, call the office immediately. Not all patients are given this instruction.  Leg swelling is common after leg bypass surgery.  The swelling should improve over a few months following surgery. To improve the swelling, you may elevate your legs above the  level of your heart while you are sitting or resting. Your surgeon or physician assistant may ask you to apply an ACE wrap or wear compression (TED) stockings to help to reduce swelling.  Reduce your risk of vascular disease  Stop smoking. If you would like help call [facility] at 1-800-QUIT-NOW 604-839-0153) or [place] at ([phone] 569-6955.  Manage your cholesterol Maintain a desired weight Control your diabetes weight Control your diabetes Keep your blood pressure down  If you have any questions, please call the office at [phone]

## 2020-12-04 ENCOUNTER — Encounter (HOSPITAL_COMMUNITY): Payer: Self-pay | Admitting: Vascular Surgery

## 2020-12-04 DIAGNOSIS — Z95820 Peripheral vascular angioplasty status with implants and grafts: Secondary | ICD-10-CM

## 2020-12-04 LAB — BASIC METABOLIC PANEL
Anion gap: 6 (ref 5–15)
BUN: 14 mg/dL (ref 8–23)
CO2: 27 mmol/L (ref 22–32)
Calcium: 8.4 mg/dL — ABNORMAL LOW (ref 8.9–10.3)
Chloride: 101 mmol/L (ref 98–111)
Creatinine, Ser: 0.87 mg/dL (ref 0.44–1.00)
GFR, Estimated: >60 mL/min (ref >60)
Glucose, Bld: 154 mg/dL — ABNORMAL HIGH (ref 70–99)
Potassium: 3.4 mmol/L — ABNORMAL LOW (ref 3.5–5.1)
Sodium: 134 mmol/L — ABNORMAL LOW (ref 135–145)

## 2020-12-04 LAB — LIPID PANEL
Cholesterol: 114 mg/dL (ref 0–200)
HDL: 71 mg/dL (ref >40)
LDL Cholesterol: 36 mg/dL (ref 0–99)
Total CHOL/HDL Ratio: 1.6 RATIO
Triglycerides: 37 mg/dL (ref <150)
VLDL: 7 mg/dL (ref 0–40)

## 2020-12-04 LAB — CBC
HCT: 35 % — ABNORMAL LOW (ref 36.0–46.0)
Hemoglobin: 12.1 g/dL (ref 12.0–15.0)
MCH: 33.1 pg (ref 26.0–34.0)
MCHC: 34.6 g/dL (ref 30.0–36.0)
MCV: 95.6 fL (ref 80.0–100.0)
Platelets: 149 10*3/uL — ABNORMAL LOW (ref 150–400)
RBC: 3.66 MIL/uL — ABNORMAL LOW (ref 3.87–5.11)
RDW: 12.1 % (ref 11.5–15.5)
WBC: 12 10*3/uL — ABNORMAL HIGH (ref 4.0–10.5)
nRBC: 0 % (ref 0.0–0.2)

## 2020-12-04 MED ORDER — OXYCODONE-ACETAMINOPHEN 5-325 MG PO TABS: 1.0000 | ORAL_TABLET | Freq: Four times a day (QID) | ORAL | 0 refills | Status: DC | PRN

## 2020-12-04 NOTE — Evaluation (Signed)
Occupational Therapy Evaluation Patient Details Name: Jill Franco MRN: [id number] DOB: Jun 19, 1934 Today's Date: 12/04/2020   History of Present Illness Pt is an 86 y.o. female admitted 12/03/20 with aortoiliac occlusive disease and LLE atherosclerosis causing ischemic rest pain. S/p BLE stenting, L femoral endarterectomy, profundaplasty, and patch angioplasty on 10/13. PMH includes COPD, CVA, HTN, PVD, vision loss.   Clinical Impression   Pt was independent, but fairly sedentary prior to admission. Demonstrated ability to perform ADL with set up, ambulate with supervision and RW in room and to sink and transfer x 2 with one hand stabilization to use BSC and return to bed. She has all necessary equipment at home, educated in multiple uses of 3 in 1, and support of her daughter and a friend. No further OT needs.     Recommendations for follow up therapy are one component of a multi-disciplinary discharge planning process, led by the attending physician.  Recommendations may be updated based on patient status, additional functional criteria and insurance authorization.   Follow Up Recommendations  No OT follow up    Equipment Recommendations  None recommended by OT    Recommendations for Other Services       Precautions / Restrictions Precautions Precautions: Fall Restrictions Weight Bearing Restrictions: No      Mobility Bed Mobility Overal bed mobility: Modified Independent                  Transfers Overall transfer level: Needs assistance Equipment used: None Transfers: Sit to/from Stand;Stand Pivot Transfers Sit to Stand: Modified independent (Device/Increase time) Stand pivot transfers: Modified independent (Device/Increase time)       General transfer comment: chair to [facility] to bed    Balance Overall balance assessment: Needs assistance Sitting-balance support: No upper extremity supported;Feet unsupported Sitting balance-Leahy Scale: Good Sitting  balance - Comments: indep to don bilateral socks   Standing balance support: Single extremity supported Standing balance-Leahy Scale: Fair Standing balance comment: can static stand without UE support; static and dynamic stability improved with walker                           ADL either performed or assessed with clinical judgement   ADL Overall ADL's : Needs assistance/impaired Eating/Feeding: Independent   Grooming: Wash/dry hands;Standing;Supervision/safety   Upper Body Bathing: Set up;Sitting   Lower Body Bathing: Set up;Sit to/from stand   Upper Body Dressing : Set up;Sitting   Lower Body Dressing: Set up;Sit to/from stand   Toilet Transfer: Modified Independent;Squat-pivot;BSC   Toileting- Clothing Manipulation and Hygiene: Modified independent;Sit to/from stand               Vision Baseline Vision/History: 1 Wears glasses Patient Visual Report: No change from baseline       Perception     Praxis      Pertinent Vitals/Pain Pain Assessment: Faces Faces Pain Scale: Hurts a little bit Pain Location: L groin incision Pain Descriptors / Indicators: Discomfort (sticky) Pain Intervention(s): Monitored during session;Premedicated before session     Hand Dominance Right   Extremity/Trunk Assessment Upper Extremity Assessment Upper Extremity Assessment: Overall WFL for tasks assessed   Lower Extremity Assessment Lower Extremity Assessment: Defer to PT evaluation       Communication Communication Communication: HOH   Cognition Arousal/Alertness: Awake/alert Behavior During Therapy: WFL for tasks assessed/performed Overall Cognitive Status: Within Functional Limits for tasks assessed  General Comments  HR up to 100s with activity; BP 139/56; SpO2 93% on RA (poor pleth)    Exercises     Shoulder Instructions      Home Living Family/patient expects to be discharged to:: Private  residence Living Arrangements: Children;Non-relatives/Friends Available Help at Discharge: Family;Available 24 hours/day Type of Home: House Home Access: Ramped entrance     Home Layout: One level     Bathroom Shower/Tub: Occupational psychologist: Standard     Home Equipment: Environmental consultant - 2 wheels;Bedside commode   Additional Comments: Lives with daughter (legally blind in one eye), and another woman, both who can help as much as needed      Prior Functioning/Environment Level of Independence: Independent        Comments: Independent without DME; primarily sedentary, but does enjoy walking around the house; reports walking outside too difficult secondary to fatigue/SOB, does not drive        OT Problem List:        OT Treatment/Interventions:      OT Goals(Current goals can be found in the care plan section) Acute Rehab OT Goals Patient Stated Goal: Return home today  OT Frequency:     Barriers to D/C:            Co-evaluation              AM-PAC OT "6 Clicks" Daily Activity     Outcome Measure Help from another person eating meals?: None Help from another person taking care of personal grooming?: A Little Help from another person toileting, which includes using toliet, bedpan, or urinal?: None Help from another person bathing (including washing, rinsing, drying)?: A Little Help from another person to put on and taking off regular upper body clothing?: None Help from another person to put on and taking off regular lower body clothing?: A Little 6 Click Score: 21   End of Session    Activity Tolerance: Patient tolerated treatment well Patient left: in bed;with call bell/phone within reach  OT Visit Diagnosis: Unsteadiness on feet (R26.81)                Time: 5537-4827 OT Time Calculation (min): 15 min Charges:  OT General Charges $OT Visit: 1 Visit OT Evaluation $OT Eval Low Complexity: 1 Low  Nestor Lewandowsky, OTR/L Acute Rehabilitation  Services Pager: [phone] Office: [phone]  Jill Franco 12/04/2020, 10:43 AM

## 2020-12-04 NOTE — Progress Notes (Addendum)
  Progress Note    12/04/2020 7:10 AM 1 Day Post-Op  Subjective:  doing well this am.  Denies any pain.  Hasn't been out of bed.  Has voided.  Afebrile 784'O-962'X systolic HR 52'W-41'L  24% RA  Gtts:  none  Vitals:   12/04/20 0200 12/04/20 0316  BP: (!) 116/53 (!) 107/43  Pulse: 62 72  Resp: 16 20  Temp:  97.6 F (36.4 C)  SpO2: 95% 95%    Physical Exam: Cardiac:  regular Lungs:  non labored Incisions:  left groin is clean and dry; right groin soft without hematoma Extremities:  brisk doppler signals bilateral DP/PT Abdomen:  soft, NT  CBC    Component Value Date/Time   WBC 12.0 (H) 12/04/2020 0320   RBC 3.66 (L) 12/04/2020 0320   HGB 12.1 12/04/2020 0320   HCT 35.0 (L) 12/04/2020 0320   PLT 149 (L) 12/04/2020 0320   MCV 95.6 12/04/2020 0320   MCH 33.1 12/04/2020 0320   MCHC 34.6 12/04/2020 0320   RDW 12.1 12/04/2020 0320   LYMPHSABS 1.4 07/02/2020 1152   MONOABS 0.5 07/02/2020 1152   EOSABS 0.1 07/02/2020 1152   BASOSABS 0.1 07/02/2020 1152    BMET    Component Value Date/Time   NA 134 (L) 12/04/2020 0320   K 3.4 (L) 12/04/2020 0320   CL 101 12/04/2020 0320   CO2 27 12/04/2020 0320   GLUCOSE 154 (H) 12/04/2020 0320   BUN 14 12/04/2020 0320   CREATININE 0.87 12/04/2020 0320   CALCIUM 8.4 (L) 12/04/2020 0320   GFRNONAA >60 12/04/2020 0320   GFRAA >60 04/26/2019 1505    INR    Component Value Date/Time   INR 1.0 11/25/2020 1030     Intake/Output Summary (Last 24 hours) at 12/04/2020 0710 Last data filed at 12/04/2020 0545 Gross per 24 hour  Intake 2140 ml  Output 500 ml  Net 1640 ml     Assessment/Plan:  86 y.o. female is s/p:  1) US guided right common femoral artery access 2) Aortogram and pelvic angiogram 3) Terminal aortic stenting (11x57mm VBX post dilated to ~70mm) 4) Right common iliac artery stenting (7x10mm VBX post dilated to 39mm proximally) 5) Left common iliac artery stenting (7x69mm VBX post dilated to 26mm  proximally) 6) left external iliac artery stenting (7x74mm viabahn) 7) left femoral endarterectomy, profundaplasty, and patch angioplasty (bovine pericardium)  1 Day Post-Op   -pt doing well this morning with brisk doppler signals both feet -incisions look fine -needs to mobilize out of bed -DVT prophylaxis:  sq heparin   Leontine Locket, PA-C [facility] [phone] 12/04/2020 7:10 AM  VASCULAR STAFF ADDENDUM: I have independently interviewed and examined the patient. I agree with the above.  Looks great POD#1 after aortic / bilateral iliac stenting with left femoral endarterectomy / profundaplasty. Her feet are warm and well perfused with strong doppler flow. Left groin soft. Labs reassuring.  OK for discharge if she can ambulate later today.  Follow up with me in 1 month with ABI / Duplex.  Yevonne Aline. Stanford Breed, MD [facility] Phone Number: [phone] 12/04/2020 7:50 AM

## 2020-12-04 NOTE — Progress Notes (Signed)
PHARMACIST LIPID MONITORING   Jill Franco is a 86 y.o. female admitted on 12/03/2020 with PVD.  Pharmacy has been consulted to optimize lipid-lowering therapy with the indication of secondary prevention for clinical ASCVD.  Recent Labs:  Lipid Panel (last 6 months):   Lab Results  Component Value Date   CHOL 114 12/04/2020   TRIG 37 12/04/2020   HDL 71 12/04/2020   CHOLHDL 1.6 12/04/2020   VLDL 7 12/04/2020   LDLCALC 36 12/04/2020    Hepatic function panel (last 6 months):   Lab Results  Component Value Date   AST 29 11/25/2020   ALT 23 11/25/2020   ALKPHOS 69 11/25/2020   BILITOT 0.7 11/25/2020    SCr (since admission):   Serum creatinine: 0.87 mg/dL 12/04/20 0320 Estimated creatinine clearance: 34.8 mL/min  Current therapy and lipid therapy tolerance Current lipid-lowering therapy:Crestor 20 mg Previous lipid-lowering therapies (if applicable): None Documented or reported allergies or intolerances to lipid-lowering therapies (if applicable): None  Assessment:   86 year old tiny lady with borderline renal function LDL 36  Plan:    1.Statin intensity (high intensity recommended for all patients regardless of the LDL):  No statin changes, high intensity statin for her age  38.Add ezetimibe (if any one of the following):   Not indicated at this time.  3.Refer to lipid clinic:   No  4.Follow-up with:  Primary care provider - Tonia Ghent, MD  5.Follow-up labs after discharge:  No changes in lipid therapy, repeat a lipid panel in one year.       Tad Moore, PharmD 12/04/2020, 7:08 AM

## 2020-12-04 NOTE — Evaluation (Signed)
Physical Therapy Evaluation Patient Details Name: Jill Franco MRN: [id number] DOB: [birth year] Today's Date: 12/04/2020  History of Present Illness  Pt is an 86 y.o. female admitted 12/03/20 with aortoiliac occlusive disease and LLE atherosclerosis causing ischemic rest pain. S/p BLE stenting, L femoral endarterectomy, profundaplasty, and patch angioplasty on 10/13. PMH includes COPD, CVA, HTN, PVD, vision loss.   Clinical Impression  Pt presents with an overall decrease in functional mobility secondary to above. PTA, pt independent, lives with supportive daughter. Educ on precautions, positioning, therex, and importance of mobility. Today, pt able to initiate transfer and gait training, stability much improved with RW use secondary to c/o LLE soreness; pt moving very well for POD#1. Pt would benefit from continued acute PT services to maximize functional mobility and independence prior to d/c home.    Recommendations for follow up therapy are one component of a multi-disciplinary discharge planning process, led by the attending physician.  Recommendations may be updated based on patient status, additional functional criteria and insurance authorization.  Follow Up Recommendations No PT follow up;Supervision - Intermittent    Equipment Recommendations  None recommended by PT    Recommendations for Other Services       Precautions / Restrictions Precautions Precautions: Fall Restrictions Weight Bearing Restrictions: No      Mobility  Bed Mobility Overal bed mobility: Modified Independent                  Transfers Overall transfer level: Needs assistance Equipment used: None;Rolling walker (2 wheeled) Transfers: Sit to/from Stand Sit to Stand: Min guard;Supervision         General transfer comment: Initial stand without DME, pt's BLEs very shaky with c/o LLE soreness, min guard; additional trial with RW, stability improved and pt endorses feeling more confident,  supervision for safety, cues for hand placement  Ambulation/Gait Ambulation/Gait assistance: Min guard;Supervision Gait Distance (Feet): 180 Feet Assistive device: Rolling walker (2 wheeled) Gait Pattern/deviations: Step-through pattern;Decreased stride length Gait velocity: Decreased   General Gait Details: Slow, steady gait with RW and intermittent min guard for balance, progressing to supervision for safety  Stairs            Wheelchair Mobility    Modified Rankin (Stroke Patients Only)       Balance Overall balance assessment: Needs assistance Sitting-balance support: No upper extremity supported;Feet unsupported Sitting balance-Leahy Scale: Good Sitting balance - Comments: indep to don bilateral socks     Standing balance-Leahy Scale: Fair Standing balance comment: can static stand without UE support; static and dynamic stability improved with walker                             Pertinent Vitals/Pain Pain Assessment: Faces Faces Pain Scale: Hurts a little bit Pain Location: L groin incision Pain Descriptors / Indicators: Discomfort Pain Intervention(s): Monitored during session    Home Living Family/patient expects to be discharged to:: Private residence Living Arrangements: Children;Non-relatives/Friends Available Help at Discharge: Family;Available 24 hours/day Type of Home: House Home Access: Ramped entrance     Home Layout: One level Home Equipment: Environmental consultant - 2 wheels Additional Comments: Lives with daughter (legally blind in one eye), and another woman, both who can help as much as needed    Prior Function Level of Independence: Independent         Comments: Independent without DME; primarily sedentary, but does enjoy walking around the house; reports walking outside too difficult secondary to  fatigue/SOB     Hand Dominance        Extremity/Trunk Assessment   Upper Extremity Assessment Upper Extremity Assessment: Overall WFL  for tasks assessed    Lower Extremity Assessment Lower Extremity Assessment: Generalized weakness       Communication   Communication: HOH  Cognition Arousal/Alertness: Awake/alert Behavior During Therapy: WFL for tasks assessed/performed Overall Cognitive Status: Within Functional Limits for tasks assessed                                        General Comments General comments (skin integrity, edema, etc.): HR up to 100s with activity; BP 139/56; SpO2 93% on RA (poor pleth)    Exercises     Assessment/Plan    PT Assessment Patient needs continued PT services  PT Problem List Decreased strength;Decreased activity tolerance;Decreased balance;Decreased mobility;Pain;Decreased knowledge of use of DME       PT Treatment Interventions DME instruction;Gait training;Functional mobility training;Therapeutic activities;Therapeutic exercise;Balance training;Patient/family education    PT Goals (Current goals can be found in the Care Plan section)  Acute Rehab PT Goals Patient Stated Goal: Return home today PT Goal Formulation: With patient Time For Goal Achievement: 12/18/20 Potential to Achieve Goals: Good    Frequency Min 3X/week   Barriers to discharge        Co-evaluation               AM-PAC PT "6 Clicks" Mobility  Outcome Measure Help needed turning from your back to your side while in a flat bed without using bedrails?: None Help needed moving from lying on your back to sitting on the side of a flat bed without using bedrails?: None Help needed moving to and from a bed to a chair (including a wheelchair)?: A Little Help needed standing up from a chair using your arms (e.g., wheelchair or bedside chair)?: A Little Help needed to walk in hospital room?: A Little Help needed climbing 3-5 steps with a railing? : A Little 6 Click Score: 20    End of Session Equipment Utilized During Treatment: Gait belt Activity Tolerance: Patient tolerated  treatment well Patient left: in chair;with call bell/phone within reach Nurse Communication: Mobility status;Other (comment) (pt indep to use BSC, does not need chair alarm secondary to this; knows to call if back to bed or to walk) PT Visit Diagnosis: Other abnormalities of gait and mobility (R26.89);Muscle weakness (generalized) (M62.81)    Time: 4920-1007 PT Time Calculation (min) (ACUTE ONLY): 24 min   Charges:   PT Evaluation $PT Eval Low Complexity: 1 Low PT Treatments $Gait Training: 8-22 mins       Mabeline Caras, PT, DPT Acute Rehabilitation Services  Pager [phone] Office [place] 12/04/2020, 8:36 AM

## 2020-12-04 NOTE — Progress Notes (Signed)
Discharge instructions provided to patient. Follow-up appointments and incision care reviewed. All questions answered. Ivs removed. Patient to be escorted hom eby her daughter and friend.  Gailen Shelter RN

## 2020-12-08 ENCOUNTER — Inpatient Hospital Stay: Admission: RE | Admit: 2020-12-08 | Payer: Medicare Other | Source: Ambulatory Visit

## 2020-12-10 ENCOUNTER — Encounter (HOSPITAL_COMMUNITY): Payer: Self-pay | Admitting: Vascular Surgery

## 2020-12-10 ENCOUNTER — Inpatient Hospital Stay: Admission: RE | Admit: 2020-12-10 | Payer: Medicare Other | Source: Ambulatory Visit

## 2020-12-11 NOTE — Discharge Summary (Signed)
Bypass Discharge Summary Patient ID: PAITLYN MCCLATCHEY [id number] 86 y.o. 1934/12/04  Admit date: 12/03/2020  Discharge date and time: 12/04/2020  3:06 PM   Admitting Physician: [name], MD   Discharge Physician: Dr. Stanford Breed  Admission Diagnoses: PAD (peripheral artery disease) [facility]) [I73.9]  Discharge Diagnoses: PAD (peripheral artery disease) ([place]) [I73.9]  Admission Condition: good  Discharged Condition: good  Indication for Admission: rest pain  Hospital Course: On the day of admission, the patient was taken to the operating room where she underwent left femoral endarterectomy, profundoplasty and patch angioplasty using bovine pericardium.  In addition she underwent left external iliac and left common iliac artery stenting.  A stent was placed in her right common iliac artery as well as her terminal aorta.  She tolerated these procedures well was taken to PACU in satisfactory condition.  On postoperative day 1, her vital signs were stable and she was afebrile.  She denied pain.  She was tolerating her diet.  She was voiding spontaneously.  Her follow-up laboratory work-up was unremarkable.  Her left groin incision was healing without signs of infection.  She had brisk bilateral dorsalis pedis and posterior tibial Doppler signals.  She was ready for discharge home after evaluation by Occupational Therapy and by physical therapy.  Consults: None  Treatments: surgery: See above   Disposition: Discharge disposition: 01-Home or Self Care       - For VQI Registry use ---  Post-op:  Wound infection: No  Graft infection: No  Transfusion: No  If yes, 0 units given New Arrhythmia: No Patency judged by: [ x] Dopper only, [ ]  Palpable graft pulse, [ ]  Palpable distal pulse, [ ]  ABI inc. > 0.15, [ ]  Duplex Discharge ABI: R , L  Discharge TBI: R , L  D/C Ambulatory Status: Ambulatory  Complications: MI: [ ]  No, [ ]  Troponin only, [ ]  EKG or Clinical CHF: No Resp  failure: [ ]  none, [ ]  Pneumonia, [ ]  Ventilator Chg in renal function: [ ]  none, [ ]  Inc. Cr > 0.5, [ ]  Temp. Dialysis, [ ]  Permanent dialysis Stroke: [ ]  None, [ ]  Minor, [ ]  Major Return to OR: No  Reason for return to OR: [ ]  Bleeding, [ ]  Infection, [ ]  Thrombosis, [ ]  Revision  Discharge medications: Statin use:  Yes ASA use:  No  for medical reason intolerant (GI upset) Plavix use:  Yes Beta blocker use: Yes Coumadin use: No  for medical reason not indicated    Patient Instructions:  Allergies as of 12/04/2020       Reactions   Aspirin Other (See Comments)   REACTION: Uncoated Stomach upset on empty stomach   Codeine Nausea Only, Other (See Comments)   Pill needs to be E.coated   Ibuprofen Nausea And Vomiting   Lipitor [atorvastatin] Other (See Comments)   myalgia   Other Itching, Rash   Nicotine patches         Medication List     TAKE these medications    albuterol 108 (90 Base) MCG/ACT inhaler Commonly known as: ProAir HFA Inhale 2 puffs into the lungs every 6 (six) hours as needed for wheezing or shortness of breath.   clopidogrel 75 MG tablet Commonly known as: PLAVIX TAKE 1 TABLET BY MOUTH  DAILY   diphenhydrAMINE 25 MG tablet Commonly known as: BENADRYL Take 25 mg by mouth at bedtime as needed for sleep.   enalapril 10 MG tablet Commonly known as: VASOTEC TAKE 1  AND 1/2 TABLETS BY  MOUTH DAILY   furosemide 20 MG tablet Commonly known as: LASIX Take 1 tablet (20 mg total) by mouth daily.   hydrochlorothiazide 12.5 MG capsule Commonly known as: MICROZIDE TAKE 1 CAPSULE BY MOUTH IN  THE MORNING   levothyroxine 50 MCG tablet Commonly known as: SYNTHROID TAKE 1 TABLET BY MOUTH  DAILY EXCEPT 2 TABLETS ON  SUNDAYS AND WEDNESDAYS (  TOTAL 9 TABS PER WEEK)   methocarbamol 500 MG tablet Commonly known as: Robaxin Take 1 tablet (500 mg total) by mouth every 8 (eight) hours as needed for muscle spasms.   metoprolol tartrate 100 MG  tablet Commonly known as: LOPRESSOR TAKE ONE-HALF TABLET BY  MOUTH IN THE MORNING AND 1  TABLET BY MOUTH IN THE  EVENING   oxyCODONE-acetaminophen 5-325 MG tablet Commonly known as: Percocet Take 1 tablet by mouth every 6 (six) hours as needed for severe pain.   rosuvastatin 20 MG tablet Commonly known as: CRESTOR Take 1 tablet (20 mg total) by mouth daily.   Symbicort 160-4.5 MCG/ACT inhaler Generic drug: budesonide-formoterol INHALE 2 INHALATIONS BY  MOUTH INTO THE LUNGS TWICE  DAILY RINSE AFTER USE   Vitamin D3 25 MCG (1000 UT) Caps Take 1 capsule (1,000 Units total) by mouth daily.       Activity: no lifting, driving, or strenuous exercise for 2 weeks Diet: cardiac diet Wound Care: keep wound clean and dry  Follow-up with Dr. Stanford Breed in 1 month.  Signed: Ed[address] Dante Roudebush 12/11/2020 11:41 AM

## 2020-12-22 ENCOUNTER — Encounter (HOSPITAL_COMMUNITY): Payer: Medicare Other

## 2020-12-22 ENCOUNTER — Encounter: Payer: Medicare Other | Admitting: Vascular Surgery

## 2020-12-24 ENCOUNTER — Other Ambulatory Visit: Payer: Self-pay

## 2020-12-24 ENCOUNTER — Ambulatory Visit (INDEPENDENT_AMBULATORY_CARE_PROVIDER_SITE_OTHER)
Admission: RE | Admit: 2020-12-24 | Discharge: 2020-12-24 | Disposition: A | Discharge status: Home / Self Care | Payer: Medicare Other | Source: Ambulatory Visit | Attending: Family Medicine | Admitting: Family Medicine

## 2020-12-24 DIAGNOSIS — J439 Emphysema, unspecified: Secondary | ICD-10-CM | POA: Diagnosis not present

## 2020-12-24 DIAGNOSIS — R911 Solitary pulmonary nodule: Secondary | ICD-10-CM | POA: Diagnosis not present

## 2020-12-24 DIAGNOSIS — I700 Atherosclerosis of aorta: Secondary | ICD-10-CM | POA: Diagnosis not present

## 2020-12-26 ENCOUNTER — Other Ambulatory Visit: Payer: Self-pay

## 2020-12-26 DIAGNOSIS — I779 Disorder of arteries and arterioles, unspecified: Secondary | ICD-10-CM

## 2020-12-29 ENCOUNTER — Encounter (HOSPITAL_COMMUNITY): Payer: Medicare Other

## 2020-12-29 ENCOUNTER — Encounter: Payer: Medicare Other | Admitting: Vascular Surgery

## 2021-01-05 ENCOUNTER — Encounter (HOSPITAL_COMMUNITY): Payer: Medicare Other

## 2021-01-05 ENCOUNTER — Encounter: Payer: Medicare Other | Admitting: Vascular Surgery

## 2021-01-07 ENCOUNTER — Other Ambulatory Visit: Payer: Self-pay | Admitting: Family Medicine

## 2021-01-08 NOTE — Telephone Encounter (Signed)
Refill request for Methocarbamol 500 mg tablet  LOV - 09/18/20 Next OV - not scheduled Last refill - 11/19/20 #30/1

## 2021-01-09 NOTE — Progress Notes (Signed)
[facility] [place]  ASSESSMENT / PLAN: Jill Franco is a 86 y.o. female with atherosclerosis of native and stented aortoiliac occlusive disease as well as bilateral lower extremity arteries causing causing disabling claudication. Now status post left femoral endarterectomy and profundaplasty, complete endovascular reconstruction of aortic bifurcation (CERAB) 12/03/20.   Recommend the following which can slow the progression of atherosclerosis and reduce the risk of major adverse cardiac / limb events:  Complete cessation from all tobacco products. Blood glucose control with goal A1c < 7%. Blood pressure control with goal blood pressure < 140/90 mmHg. Lipid reduction therapy with goal LDL-C <100 mg/dL (<70 if symptomatic from PAD).  Aspirin 81mg  PO QD.  Plavix 75mg  PO QD x 1 month (OK to stop DAPT now). Atorvastatin 40-80mg  PO QD (or other "high intensity" statin therapy).  Her non-invasive studies show little change, but she has had symptomatic relief of claudication. I suspect this is because of the indirect revascularization (restoring flow to profundas only). Will see her again in 6 months with repeat ABI.   CHIEF COMPLAINT: Calf pain with walking  HISTORY OF PRESENT ILLNESS: Jill Franco is a 86 y.o. female who returns to clinic for surveillance of peripheral arterial disease.  She reports deteriorating claudication symptoms.  She is now disabled by her symptoms.  She can only walk a short distance before severe pain sets in bilateral calves.  She has no wounds about her feet.  She occasionally has pain in her foot at rest, but does not report symptoms typical of ischemic rest pain.  She continues to smoke, but is working hard to quit.  She is using patches with some success.  10/27/20: patient returns after evaluation by Dr. Einar Gip. Pending stress test and echo. No interval changes.  01/11/21: Patient returns after hybrid revascularization.  She reports  complete symptomatic relief of claudication.  She is walking as far as she likes.  She is appreciative.   VASCULAR SURGICAL HISTORY:  03/18/16 -- left common iliac stent (6 x 40), terminal aortic stent (10 x 19)  ([place])  12/03/20 --  1) US guided right common femoral artery access 2) Aortogram and pelvic angiogram 3) Terminal aortic stenting (11x35mm VBX post dilated to ~64mm) 4) Right common iliac artery stenting (7x78mm VBX post dilated to 32mm proximally) 5) Left common iliac artery stenting (7x39mm VBX post dilated to 75mm proximally) 6) left external iliac artery stenting (7x31mm viabahn) 7) left femoral endarterectomy, profundaplasty, and patch angioplasty (bovine pericardium)  VASCULAR RISK FACTORS: Positive history of stroke / transient ischemic attack. Negative history of coronary artery disease.  Negative history of diabetes mellitus. Last A1c 5.7. Positive history of smoking. Not actively smoking. Positive history of hypertension.  No reported history of chronic kidney disease but last GFR 58-73. CKD stage 2 - 3a. Negative history of chronic obstructive pulmonary disease, but patient has been treated with inhalers with symptomatic relief.   FUNCTIONAL STATUS: ECOG performance status: (1) Restricted in physically strenuous activity, ambulatory and able to do work of light nature Ambulatory status: Ambulatory within the community with limits  Past Medical History:  Diagnosis Date   Cataract    bilaterally corrected.   COPD (chronic obstructive pulmonary disease) ([place])    CVA (cerebral vascular accident) ([place])    Pt was unaware and does not know when it happened.  Was told after some testing was done.   Double vision    Dyspnea    Facial droop 10/2017   left  History of shingles 2012   HLD (hyperlipidemia)    HTN (hypertension)    Hypothyroidism    PVD (peripheral vascular disease) ([place])    Vision loss    temporary    Past Surgical History:  Procedure  Laterality Date   ABDOMINAL EXPLORATION SURGERY  1950s   "they thought I was pregnant; went in to explore"   APPENDECTOMY  childhood   BACK SURGERY     BREAST LUMPECTOMY Left 1960's   benign   carotid ultrasound  06/19/2007   0-39% stenosis   CATARACT EXTRACTION W/ INTRAOCULAR LENS IMPLANT Left 09/2016   CATARACT EXTRACTION W/ INTRAOCULAR LENS IMPLANT Right 10/2016   COLONOSCOPY WITH PROPOFOL N/A 04/08/2019   Procedure: COLONOSCOPY WITH PROPOFOL;  Surgeon: Milus Banister, MD;  Location: [facility];  Service: Endoscopy;  Laterality: N/A;   DILATION AND CURETTAGE OF UTERUS  1960's X 3   miscarriages   DOPPLER ECHOCARDIOGRAPHY  06/19/2007   Normal EF 55-70%   ENDARTERECTOMY FEMORAL Left 12/03/2020   Procedure: LEFT FEMORAL ENDARTERECTOMY AND PROFUNDAPLASTY;  Surgeon: [name], MD;  Location: [facility];  Service: Vascular;  Laterality: Left;   ILIAC ARTERY STENT Left 03/18/2016   common iliac stent (6 x 40), aortic stent (10 x 19)/notes 03/18/2016   INSERTION OF ILIAC STENT Left 12/03/2020   Procedure: AORTIC STENTING, INSERTION OF BILATERAL COMMON ILIAC STENTS, AND LEFT EXTERNAL ILIAC STENT;  Surgeon: [name], MD;  Location: [facility];  Service: Vascular;  Laterality: Left;   LAMINECTOMY AND MICRODISCECTOMY SPINE  1960's X 2   LOWER EXTREMITY ANGIOGRAM N/A 12/03/2020   Procedure: Andris Baumann;  Surgeon: [name], MD;  Location: [facility];  Service: Vascular;  Laterality: N/A;   MOLE REMOVAL  07/10/2016   NSVD     x1   PATCH ANGIOPLASTY Left 12/03/2020   Procedure: PATCH ANGIOPLASTY OF LEFT COMMON FEMORAL ARTERY USING BOVINE PERICARDIUM PATCH;  Surgeon: [name], MD;  Location: [facility];  Service: Vascular;  Laterality: Left;   PERIPHERAL VASCULAR CATHETERIZATION N/A 03/18/2016   Procedure: Abdominal Aortogram w/Lower Extremity;  Surgeon: Elam Dutch, MD;  Location: [facility];  Service: Cardiovascular;  Laterality: N/A;   PERIPHERAL VASCULAR  CATHETERIZATION  03/18/2016   Procedure: Peripheral Vascular Intervention;  Surgeon: Elam Dutch, MD;  Location: [facility];  Service: Cardiovascular;;   POLYPECTOMY  04/08/2019   Procedure: POLYPECTOMY;  Surgeon: Milus Banister, MD;  Location: [facility];  Service: Endoscopy;;   ULTRASOUND GUIDANCE FOR VASCULAR ACCESS Right 12/03/2020   Procedure: ULTRASOUND GUIDANCE FOR VASCULAR ACCESS, RIGHT FEMORAL ARTERY;  Surgeon: [name], MD;  Location: [facility];  Service: Vascular;  Laterality: Right;    Family History  Problem Relation Age of Onset   Stroke Father 101   Cancer Brother    Colon cancer Neg Hx    Breast cancer Neg Hx    Colon polyps Neg Hx    Esophageal cancer Neg Hx    Rectal cancer Neg Hx    Stomach cancer Neg Hx     Social History   Socioeconomic History   Marital status: Widowed    Spouse name: Not on file   Number of children: 1   Years of education: Not on file   Highest education level: Not on file  Occupational History   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     Her daughter is helping at home some (some as of 2022).  Her daughter has vision loss and is widowed as of 2022 and moved in with patient.   Social Determinants of Health   Financial Resource Strain: Not on file  Food Insecurity: Not on file  Transportation Needs: Not on file  Physical  Activity: Not on file  Stress: Not on file  Social Connections: Not on file  Intimate Partner Violence: Not on file    Allergies  Allergen Reactions   Aspirin Other (See Comments)    REACTION: Uncoated Stomach upset on empty stomach   Codeine Nausea Only and Other (See Comments)    Pill needs to be E.coated   Ibuprofen Nausea And Vomiting   Lipitor [Atorvastatin] Other (See Comments)    myalgia   Other Itching and Rash    Nicotine patches     Current Outpatient Medications  Medication Sig Dispense Refill   albuterol (PROAIR HFA) 108 (90 Base) MCG/ACT inhaler Inhale 2 puffs into the lungs every 6 (six) hours as needed for wheezing or shortness of breath. 18 g 5   Cholecalciferol (VITAMIN D3) 25 MCG (1000 UT) CAPS Take 1 capsule (1,000 Units total) by mouth daily.     clopidogrel (PLAVIX) 75 MG tablet TAKE 1 TABLET BY MOUTH  DAILY 90 tablet 3   diphenhydrAMINE (BENADRYL) 25 MG tablet Take 25 mg by mouth at bedtime as needed for sleep.     enalapril (VASOTEC) 10 MG tablet TAKE 1 AND 1/2 TABLETS BY  MOUTH DAILY 135 tablet 3   furosemide (LASIX) 20 MG tablet Take 1 tablet (20 mg total) by mouth daily. 90 tablet 1   hydrochlorothiazide (MICROZIDE) 12.5 MG capsule TAKE 1 CAPSULE BY MOUTH IN  THE MORNING 90 capsule 3   levothyroxine (SYNTHROID) 50 MCG tablet TAKE 1 TABLET BY MOUTH  DAILY EXCEPT 2 TABLETS ON  SUNDAYS AND WEDNESDAYS (  TOTAL 9 TABS PER WEEK) 117 tablet 3   methocarbamol (ROBAXIN) 500 MG tablet TAKE 1 TABLET BY MOUTH EVERY 8 HOURS AS NEEDED FOR MUSCLE SPASM 30 tablet 1   metoprolol tartrate (LOPRESSOR) 100 MG tablet TAKE ONE-HALF TABLET BY  MOUTH IN THE MORNING AND 1  TABLET BY MOUTH IN THE  EVENING 135 tablet 3   rosuvastatin (CRESTOR) 20 MG tablet Take 1 tablet (20 mg total) by mouth daily. 90 tablet 3   SYMBICORT 160-4.5 MCG/ACT inhaler INHALE 2 INHALATIONS BY  MOUTH INTO THE LUNGS TWICE  DAILY RINSE AFTER USE 30.6 g 3   oxyCODONE-acetaminophen (PERCOCET) 5-325 MG tablet  Take 1 tablet by mouth every 6 (six) hours as needed for severe pain. (Patient not taking: Reported on 01/12/2021) 15 tablet 0   No current facility-administered medications for this visit.    REVIEW OF SYSTEMS:  [X]  denotes positive  finding, [ ]  denotes negative finding Cardiac  Comments:  Chest pain or chest pressure:    Shortness of breath upon exertion:    Short of breath when lying flat:    Irregular heart rhythm:        Vascular    Pain in calf, thigh, or hip brought on by ambulation:    Pain in feet at night that wakes you up from your sleep:     Blood clot in your veins:    Leg swelling:         Pulmonary    Oxygen at home:    Productive cough:     Wheezing:         Neurologic    Sudden weakness in arms or legs:     Sudden numbness in arms or legs:     Sudden onset of difficulty speaking or slurred speech:    Temporary loss of vision in one eye:     Problems with dizziness:         Gastrointestinal    Blood in stool:     Vomited blood:         Genitourinary    Burning when urinating:     Blood in urine:        Psychiatric    Major depression:         Hematologic    Bleeding problems:    Problems with blood clotting too easily:        Skin    Rashes or ulcers:        Constitutional    Fever or chills:      PHYSICAL EXAM Vitals:   01/12/21 1010  BP: 100/61  Pulse: 64  Resp: 14  Temp: 97.6 F (36.4 C)  TempSrc: Temporal  SpO2: 97%  Weight: 99 lb (44.9 kg)  Height: 5\' 4"  (1.626 m)      Constitutional: well appearing. no distress. Thin. Appears marginally nourished.  Neurologic: CN intact. no focal findings. no sensory loss. Psychiatric:  Mood and affect symmetric and appropriate. Eyes:  No icterus. No conjunctival pallor. Ears, nose, throat:  mucous membranes moist. Midline trachea.  Cardiac: regular rate and rhythm.  Respiratory:  unlabored. Abdominal:  soft, non-tender, non-distended.  Peripheral vascular: Femoral pulses 2+.  Left  groin incision healed well. Extremity: no edema. no cyanosis. no pallor.  Skin: no gangrene. no ulceration. Atrophic skin. Lymphatic: no Stemmer's sign. no palpable lymphadenopathy.  PERTINENT LABORATORY AND RADIOLOGIC DATA  Most recent CBC CBC Latest Ref Rng & Units 12/04/2020 12/03/2020 11/25/2020  WBC 4.0 - 10.5 K/uL 12.0(H) 11.0(H) 8.0  Hemoglobin 12.0 - 15.0 g/dL 12.1 13.6 15.9(H)  Hematocrit 36.0 - 46.0 % 35.0(L) 40.4 48.1(H)  Platelets 150 - 400 K/uL 149(L) 162 205     Most recent CMP CMP Latest Ref Rng & Units 12/04/2020 12/03/2020 11/25/2020  Glucose 70 - 99 mg/dL 154(H) - 115(H)  BUN 8 - 23 mg/dL 14 - 14  Creatinine 0.44 - 1.00 mg/dL 0.87 0.75 0.90  Sodium 135 - 145 mmol/L 134(L) - 134(L)  Potassium 3.5 - 5.1 mmol/L 3.4(L) - 3.9  Chloride 98 - 111 mmol/L 101 - 96(L)  CO2 22 - 32 mmol/L 27 - 30  Calcium 8.9 - 10.3 mg/dL 8.4(L) - 9.6  Total Protein 6.5 - 8.1 g/dL - - 7.2  Total Bilirubin 0.3 - 1.2 mg/dL - - 0.7  Alkaline Phos 38 - 126 U/L - - 69  AST 15 - 41 U/L - -  29  ALT 0 - 44 U/L - - 23    Renal function CrCl cannot be calculated (Patient's most recent lab result is older than the maximum 21 days allowed.).  Hgb A1c MFr Bld (%)  Date Value  09/18/2020 5.7    LDL Cholesterol  Date Value Ref Range Status  12/04/2020 36 0 - 99 mg/dL Final    Comment:           Total Cholesterol/HDL:CHD Risk Coronary Heart Disease Risk Table                     Men   Women  1/2 Average Risk   3.4   3.3  Average Risk       5.0   4.4  2 X Average Risk   9.6   7.1  3 X Average Risk  23.4   11.0        Use the calculated Patient Ratio above and the CHD Risk Table to determine the patient's CHD Risk.        ATP III CLASSIFICATION (LDL):  <100     mg/dL   Optimal  100-129  mg/dL   Near or Above                    Optimal  130-159  mg/dL   Borderline  160-189  mg/dL   High  >190     mg/dL   Very High Performed at [facility] [address], [place], [place]  [address]    Direct LDL  Date Value Ref Range Status  06/19/2012 115.8 mg/dL Final    Comment:    Optimal:  <100 mg/dLNear or Above Optimal:  100-129 mg/dLBorderline High:  130-159 mg/dLHigh:  160-189 mg/dLVery High:  >190 mg/dL     ABI  +-------+-----------+-----------+------------+------------+  ABI/TBIToday's ABIToday's TBIPrevious ABIPrevious TBI  +-------+-----------+-----------+------------+------------+  Right  0.66       0.25       0.64        0.39          +-------+-----------+-----------+------------+------------+  Left   0.66       0.37       0.54        0.25          +-------+-----------+-----------+------------+------------+   LLE duplex: Left: Elevated velocities suggest 50-74% stenosis noted in the common  femoral artery. 75-99% stenosis noted in the superficial femoral artery.   Aortoiliac duplex: Visualization of the Distal Abdominal Aorta, Right CIA, and Left CIA was  limited. Bilateral common iliac arteries were not visualized due to  excessive bowel gas. Calcification noted throughout abdominal arteries.  Stent walls not well visualized.  Velocities suggest >50% stenosis in the bilateral external iliac arteries.  This is based on limited visualization - further imaging may be warranted.   Yevonne Aline. Stanford Breed, MD [facility] Phone Number: [phone] 01/12/2021 10:58 AM

## 2021-01-09 NOTE — Telephone Encounter (Signed)
Sent. Thanks.   

## 2021-01-12 ENCOUNTER — Other Ambulatory Visit: Payer: Self-pay

## 2021-01-12 ENCOUNTER — Ambulatory Visit (INDEPENDENT_AMBULATORY_CARE_PROVIDER_SITE_OTHER)
Admission: RE | Admit: 2021-01-12 | Discharge: 2021-01-12 | Disposition: A | Discharge status: Home / Self Care | Payer: Medicare Other | Source: Ambulatory Visit | Attending: Vascular Surgery | Admitting: Vascular Surgery

## 2021-01-12 ENCOUNTER — Encounter: Payer: Self-pay | Admitting: Vascular Surgery

## 2021-01-12 ENCOUNTER — Ambulatory Visit (INDEPENDENT_AMBULATORY_CARE_PROVIDER_SITE_OTHER): Payer: Medicare Other | Admitting: Vascular Surgery

## 2021-01-12 ENCOUNTER — Ambulatory Visit (HOSPITAL_COMMUNITY)
Admission: RE | Admit: 2021-01-12 | Discharge: 2021-01-12 | Disposition: A | Discharge status: Home / Self Care | Payer: Medicare Other | Source: Ambulatory Visit | Attending: Vascular Surgery | Admitting: Vascular Surgery

## 2021-01-12 VITALS — BP 100/61 | HR 64 | Temp 97.6°F | Resp 14 | Ht 64.0 in | Wt 99.0 lb

## 2021-01-12 DIAGNOSIS — I779 Disorder of arteries and arterioles, unspecified: Secondary | ICD-10-CM | POA: Insufficient documentation

## 2021-01-12 DIAGNOSIS — I739 Peripheral vascular disease, unspecified: Secondary | ICD-10-CM

## 2021-02-01 ENCOUNTER — Telehealth: Payer: Self-pay | Admitting: Family Medicine

## 2021-02-01 NOTE — Telephone Encounter (Signed)
Patient has called wanting to inform Dr.Duncan that she likes the [facility] inhaler better than albuterol. Wants to keep symbicort on her medication list.

## 2021-02-02 NOTE — Telephone Encounter (Signed)
Duly noted.  Glad to hear.  Would still be okay to use albuterol on top of symbicort if needed but let me know if that is a frequent need.  Thanks.

## 2021-02-06 ENCOUNTER — Emergency Department (HOSPITAL_COMMUNITY): Payer: Medicare Other

## 2021-02-06 ENCOUNTER — Other Ambulatory Visit: Payer: Self-pay

## 2021-02-06 ENCOUNTER — Emergency Department (HOSPITAL_COMMUNITY)
Admission: EM | Admit: 2021-02-06 | Discharge: 2021-02-06 | Disposition: A | Discharge status: Home / Self Care | Payer: Medicare Other | Attending: Emergency Medicine | Admitting: Emergency Medicine

## 2021-02-06 DIAGNOSIS — F17210 Nicotine dependence, cigarettes, uncomplicated: Secondary | ICD-10-CM | POA: Insufficient documentation

## 2021-02-06 DIAGNOSIS — J441 Chronic obstructive pulmonary disease with (acute) exacerbation: Secondary | ICD-10-CM | POA: Insufficient documentation

## 2021-02-06 DIAGNOSIS — R0602 Shortness of breath: Secondary | ICD-10-CM | POA: Diagnosis not present

## 2021-02-06 DIAGNOSIS — Z743 Need for continuous supervision: Secondary | ICD-10-CM | POA: Diagnosis not present

## 2021-02-06 DIAGNOSIS — I499 Cardiac arrhythmia, unspecified: Secondary | ICD-10-CM | POA: Diagnosis not present

## 2021-02-06 DIAGNOSIS — Z20822 Contact with and (suspected) exposure to covid-19: Secondary | ICD-10-CM | POA: Diagnosis not present

## 2021-02-06 DIAGNOSIS — J449 Chronic obstructive pulmonary disease, unspecified: Secondary | ICD-10-CM | POA: Diagnosis not present

## 2021-02-06 DIAGNOSIS — E039 Hypothyroidism, unspecified: Secondary | ICD-10-CM | POA: Insufficient documentation

## 2021-02-06 DIAGNOSIS — I10 Essential (primary) hypertension: Secondary | ICD-10-CM | POA: Diagnosis not present

## 2021-02-06 DIAGNOSIS — Z79899 Other long term (current) drug therapy: Secondary | ICD-10-CM | POA: Diagnosis not present

## 2021-02-06 DIAGNOSIS — R0689 Other abnormalities of breathing: Secondary | ICD-10-CM | POA: Diagnosis not present

## 2021-02-06 DIAGNOSIS — Z7901 Long term (current) use of anticoagulants: Secondary | ICD-10-CM | POA: Diagnosis not present

## 2021-02-06 DIAGNOSIS — R6889 Other general symptoms and signs: Secondary | ICD-10-CM | POA: Diagnosis not present

## 2021-02-06 LAB — COMPREHENSIVE METABOLIC PANEL
ALT: 16 U/L (ref 0–44)
AST: 37 U/L (ref 15–41)
Albumin: 3.6 g/dL (ref 3.5–5.0)
Alkaline Phosphatase: 66 U/L (ref 38–126)
Anion gap: 10 (ref 5–15)
BUN: 21 mg/dL (ref 8–23)
CO2: 28 mmol/L (ref 22–32)
Calcium: 9.3 mg/dL (ref 8.9–10.3)
Chloride: 100 mmol/L (ref 98–111)
Creatinine, Ser: 0.97 mg/dL (ref 0.44–1.00)
GFR, Estimated: 58 mL/min — ABNORMAL LOW (ref >60)
Glucose, Bld: 126 mg/dL — ABNORMAL HIGH (ref 70–99)
Potassium: 4.8 mmol/L (ref 3.5–5.1)
Sodium: 138 mmol/L (ref 135–145)
Total Bilirubin: 1.3 mg/dL — ABNORMAL HIGH (ref 0.3–1.2)
Total Protein: 6.3 g/dL — ABNORMAL LOW (ref 6.5–8.1)

## 2021-02-06 LAB — CBC WITH DIFFERENTIAL/PLATELET
Abs Immature Granulocytes: 0.03 10*3/uL (ref 0.00–0.07)
Basophils Absolute: 0.1 10*3/uL (ref 0.0–0.1)
Basophils Relative: 1 %
Eosinophils Absolute: 0.2 10*3/uL (ref 0.0–0.5)
Eosinophils Relative: 3 %
HCT: 45.3 % (ref 36.0–46.0)
Hemoglobin: 15.2 g/dL — ABNORMAL HIGH (ref 12.0–15.0)
Immature Granulocytes: 0 %
Lymphocytes Relative: 11 %
Lymphs Abs: 0.9 10*3/uL (ref 0.7–4.0)
MCH: 32.9 pg (ref 26.0–34.0)
MCHC: 33.6 g/dL (ref 30.0–36.0)
MCV: 98.1 fL (ref 80.0–100.0)
Monocytes Absolute: 0.4 10*3/uL (ref 0.1–1.0)
Monocytes Relative: 5 %
Neutro Abs: 6 10*3/uL (ref 1.7–7.7)
Neutrophils Relative %: 80 %
Platelets: 225 10*3/uL (ref 150–400)
RBC: 4.62 MIL/uL (ref 3.87–5.11)
RDW: 12.6 % (ref 11.5–15.5)
WBC: 7.6 10*3/uL (ref 4.0–10.5)
nRBC: 0 % (ref 0.0–0.2)

## 2021-02-06 LAB — BRAIN NATRIURETIC PEPTIDE: B Natriuretic Peptide: 246.7 pg/mL — ABNORMAL HIGH (ref 0.0–100.0)

## 2021-02-06 LAB — RESP PANEL BY RT-PCR (FLU A&B, COVID) ARPGX2
Influenza A by PCR: NEGATIVE
Influenza B by PCR: NEGATIVE
SARS Coronavirus 2 by RT PCR: NEGATIVE

## 2021-02-06 LAB — TROPONIN I (HIGH SENSITIVITY): Troponin I (High Sensitivity): 7 ng/L (ref <18)

## 2021-02-06 MED ORDER — ALBUTEROL (5 MG/ML) CONTINUOUS INHALATION SOLN
10.0000 mg/h | INHALATION_SOLUTION | Freq: Once | RESPIRATORY_TRACT | Status: DC
  Filled 2021-02-06: qty 0.5

## 2021-02-06 MED ORDER — METHYLPREDNISOLONE SODIUM SUCC 125 MG IJ SOLR
125.0000 mg | Freq: Once | INTRAMUSCULAR | Status: AC
  Administered 2021-02-06: 125 mg via INTRAVENOUS
  Filled 2021-02-06: qty 2

## 2021-02-06 MED ORDER — PREDNISONE 20 MG PO TABS
40.0000 mg | ORAL_TABLET | Freq: Once | ORAL | Status: AC
  Administered 2021-02-06: 40 mg via ORAL
  Filled 2021-02-06: qty 2

## 2021-02-06 MED ORDER — ALBUTEROL SULFATE HFA 108 (90 BASE) MCG/ACT IN AERS
2.0000 | INHALATION_SPRAY | RESPIRATORY_TRACT | Status: DC | PRN
Start: 2021-02-06 — End: 2021-02-06
  Administered 2021-02-06: 2 via RESPIRATORY_TRACT
  Filled 2021-02-06: qty 6.7

## 2021-02-06 MED ORDER — IPRATROPIUM-ALBUTEROL 0.5-2.5 (3) MG/3ML IN SOLN
3.0000 mL | Freq: Once | RESPIRATORY_TRACT | Status: AC
  Administered 2021-02-06: 3 mL via RESPIRATORY_TRACT
  Filled 2021-02-06: qty 3

## 2021-02-06 MED ORDER — METOPROLOL TARTRATE 25 MG PO TABS
50.0000 mg | ORAL_TABLET | Freq: Once | ORAL | Status: AC
  Administered 2021-02-06: 25 mg via ORAL

## 2021-02-06 MED ORDER — AEROCHAMBER PLUS FLO-VU LARGE MISC: 1.0000 | Freq: Once | Status: DC

## 2021-02-06 MED ORDER — METOPROLOL TARTRATE 25 MG PO TABS
100.0000 mg | ORAL_TABLET | Freq: Once | ORAL | Status: DC
  Filled 2021-02-06: qty 4

## 2021-02-06 MED ORDER — PREDNISONE 20 MG PO TABS: ORAL_TABLET | ORAL | 0 refills | Status: DC

## 2021-02-06 MED ORDER — FUROSEMIDE 10 MG/ML IJ SOLN
40.0000 mg | Freq: Once | INTRAMUSCULAR | Status: AC
  Administered 2021-02-06: 40 mg via INTRAVENOUS
  Filled 2021-02-06: qty 4

## 2021-02-06 MED ORDER — DOXYCYCLINE HYCLATE 100 MG PO CAPS: 100.0000 mg | ORAL_CAPSULE | Freq: Two times a day (BID) | ORAL | 0 refills | Status: DC

## 2021-02-06 NOTE — ED Provider Notes (Addendum)
COPD.  Patient improved with 1 DuoNeb but still was dyspneic.  Currently getting hour continuous nebulizer treatment.  Suspected COPD if significantly improved consider discharge. Physical Exam  BP (!) 164/64 (BP Location: Right Arm)    Pulse 88    Resp (!) 25    SpO2 97%   Physical Exam  ED Course/Procedures     Procedures  MDM  12:00 patient reports feeling much improved after 2 DuoNeb therapies.  She reports she feels much better than upon arrival.  Recheck is for improved airflow with some occasional wheeze.  Patient does not have any peripheral edema.  Chest x-ray shows slight amount of graininess but not changed from previous, BNP has mild elevation at 200.  Clinically patient does not show signs of significant congestive heart failure troponin is negative.  1 IV dose of Lasix given.  Will have patient increase her Lasix dose for 2 days.  Certainly also appears to be more significant component of COPD.  We will continue oral steroids and albuterol therapy.         Charlesetta Shanks, MD 02/06/21 1210    Charlesetta Shanks, MD 02/06/21 [phone]

## 2021-02-06 NOTE — Discharge Instructions (Addendum)
1.  At this time your symptoms appear most likely due to COPD.  You were given an IV dose of steroids and breathing treatments in the emergency department.  You are being prescribed an antibiotic today called doxycycline.  Take this as prescribed, start today.  You are also being prescribed prednisone, start that prescription tomorrow.  Use the albuterol inhaler 2 puffs with the spacer as instructed in the emergency department.  Use it every 4 hours but you may also use an additional dose every 2 hours if needed for extra wheezing or coughing. 2.  You were given 40 mg of Lasix in the emergency department today.  Your usual dose is 20 mg.  Take 40 mg a day for the next 2 days and then return to your dose of 20 mg. 3.  Is very importantly to follow-up closely with your family doctor.  Try to make an appointment within the next 2 to 5 days.  If your symptoms are worsening and you cannot be seen by your family doctor, return to the emergency department.

## 2021-02-06 NOTE — ED Triage Notes (Signed)
Pt bib [facility] c/o sob. Hx of COPD, felt sob yesterday and used rescue inhaler, saw improvement. Felt sob tonight, did same with no improvement. 1.25 mg solumedrol and duoneb given PTA. Expiratory wheezing noted, improved with duoneb and then came back upon arrival.  VS PTA BP 180/82, O2 93% RA, HR 94, SR w PACs

## 2021-02-06 NOTE — ED Provider Notes (Addendum)
[facility] EMERGENCY DEPARTMENT Provider Note   CSN: [id number] Arrival date & time: 02/06/21  6213     History Chief Complaint  Patient presents with   Respiratory Distress    Jill Franco is a 86 y.o. female.  Patient presents to ER chief complaint of difficulty breathing.  Symptoms started yesterday.  Describes it as persistent.  Otherwise denies any new cough denies fevers denies headache denies chest pain.  No vomiting or diarrhea reported.      Past Medical History:  Diagnosis Date   Cataract    bilaterally corrected.   COPD (chronic obstructive pulmonary disease) ([place])    CVA (cerebral vascular accident) ([place])    Pt was unaware and does not know when it happened.  Was told after some testing was done.   Double vision    Dyspnea    Facial droop 10/2017   left   History of shingles 2012   HLD (hyperlipidemia)    HTN (hypertension)    Hypothyroidism    PVD (peripheral vascular disease) ([place])    Vision loss    temporary    Patient Active Problem List   Diagnosis Date Noted   Dysuria 10/25/2020   Osteoporosis 10/11/2020   Hyperglycemia 09/22/2020   SOB (shortness of breath) 09/22/2020   Elevated LFTs 06/16/2020   Protein-calorie malnutrition, mild ([place]) 06/16/2020   PND (paroxysmal nocturnal dyspnea) 06/16/2020   Anemia 09/12/2019   Pulmonary nodule 04/17/2019   Hyponatremia 04/17/2019   Vertigo 04/17/2019   Polyp of transverse colon    Lower GI bleed 04/05/2019   Medicare annual wellness visit, subsequent 09/12/2018   CVA (cerebral vascular accident) ([place]) 11/27/2017   Vision loss 11/21/2017   Double vision 08/16/2016   PAD (peripheral artery disease) ([place]) 03/18/2016   Peripheral vascular disease ([place]) 01/11/2016   Right leg pain 01/11/2016   Back pain 01/03/2016   Claudication ([place]) 01/03/2016   Paresthesia 01/03/2016   Hair loss 10/01/2015   Allergic rhinitis 03/20/2015   Advance care planning 08/04/2014   Smoking  08/04/2014   Healthcare maintenance 06/07/2011   Hypothyroidism 08/23/2006   HLD (hyperlipidemia) 08/23/2006   HYPERTENSION, BENIGN ESSENTIAL 07/18/2006    Past Surgical History:  Procedure Laterality Date   ABDOMINAL EXPLORATION SURGERY  1950s   "they thought I was pregnant; went in to explore"   APPENDECTOMY  childhood   BACK SURGERY     BREAST LUMPECTOMY Left 1960's   benign   carotid ultrasound  06/19/2007   0-39% stenosis   CATARACT EXTRACTION W/ INTRAOCULAR LENS IMPLANT Left 09/2016   CATARACT EXTRACTION W/ INTRAOCULAR LENS IMPLANT Right 10/2016   COLONOSCOPY WITH PROPOFOL N/A 04/08/2019   Procedure: COLONOSCOPY WITH PROPOFOL;  Surgeon: Milus Banister, MD;  Location: [facility];  Service: Endoscopy;  Laterality: N/A;   DILATION AND CURETTAGE OF UTERUS  1960's X 3   miscarriages   DOPPLER ECHOCARDIOGRAPHY  06/19/2007   Normal EF 55-70%   ENDARTERECTOMY FEMORAL Left 12/03/2020   Procedure: LEFT FEMORAL ENDARTERECTOMY AND PROFUNDAPLASTY;  Surgeon: [name], MD;  Location: [facility] OR;  Service: Vascular;  Laterality: Left;   ILIAC ARTERY STENT Left 03/18/2016   common iliac stent (6 x 40), aortic stent (10 x 19)/notes 03/18/2016   INSERTION OF ILIAC STENT Left 12/03/2020   Procedure: AORTIC STENTING, INSERTION OF BILATERAL COMMON ILIAC STENTS, AND LEFT EXTERNAL ILIAC STENT;  Surgeon: [name], MD;  Location: [place];  Service: Vascular;  Laterality: Left;   LAMINECTOMY  AND MICRODISCECTOMY SPINE  1960's X 2   LOWER EXTREMITY ANGIOGRAM N/A 12/03/2020   Procedure: Andris Baumann;  Surgeon: [name], MD;  Location: [facility] OR;  Service: Vascular;  Laterality: N/A;   MOLE REMOVAL  07/10/2016   NSVD     x1   PATCH ANGIOPLASTY Left 12/03/2020   Procedure: PATCH ANGIOPLASTY OF LEFT COMMON FEMORAL ARTERY USING BOVINE PERICARDIUM PATCH;  Surgeon: [name], MD;  Location: [place];  Service: Vascular;  Laterality: Left;   PERIPHERAL VASCULAR CATHETERIZATION N/A  03/18/2016   Procedure: Abdominal Aortogram w/Lower Extremity;  Surgeon: Elam Dutch, MD;  Location: [place] CV [facility];  Service: Cardiovascular;  Laterality: N/A;   PERIPHERAL VASCULAR CATHETERIZATION  03/18/2016   Procedure: Peripheral Vascular Intervention;  Surgeon: Elam Dutch, MD;  Location: [place] [facility];  Service: Cardiovascular;;   POLYPECTOMY  04/08/2019   Procedure: POLYPECTOMY;  Surgeon: Milus Banister, MD;  Location: [facility];  Service: Endoscopy;;   ULTRASOUND GUIDANCE FOR VASCULAR ACCESS Right 12/03/2020   Procedure: ULTRASOUND GUIDANCE FOR VASCULAR ACCESS, RIGHT FEMORAL ARTERY;  Surgeon: [name], MD;  [facility];  Service: Vascular;  Laterality: Right;     OB History   No obstetric history on file.     Family History  Problem Relation Age of Onset   Stroke Father 79   Cancer Brother    Colon cancer Neg Hx    Breast cancer Neg Hx    Colon polyps Neg Hx    Esophageal cancer Neg Hx    Rectal cancer Neg Hx    Stomach cancer Neg Hx     Social History   Tobacco Use   Smoking status: Every Day    Packs/day: 0.25    Years: 69.00    Pack years: 17.25    Types: Cigarettes    Last attempt to quit: 04/21/2019    Years since quitting: 1.8   Smokeless tobacco: Never  Vaping Use   Vaping Use: Never used  Substance Use Topics   Alcohol use: No    Alcohol/week: 0.0 standard drinks   Drug use: No    Home Medications Prior to Admission medications   Medication Sig Start Date End Date Taking? Authorizing Provider  albuterol (PROAIR HFA) 108 (90 Base) MCG/ACT inhaler Inhale 2 puffs into the lungs every 6 (six) hours as needed for wheezing or shortness of breath. 09/23/20   Tonia Ghent, MD  Cholecalciferol (VITAMIN D3) 25 MCG (1000 UT) CAPS Take 1 capsule (1,000 Units total) by mouth daily. 10/25/20   Tonia Ghent, MD  clopidogrel (PLAVIX) 75 MG tablet TAKE 1 TABLET BY MOUTH  DAILY 08/20/20   Tonia Ghent, MD  diphenhydrAMINE  (BENADRYL) 25 MG tablet Take 25 mg by mouth at bedtime as needed for sleep.    [provider]  enalapril (VASOTEC) 10 MG tablet TAKE 1 AND 1/2 TABLETS BY  MOUTH DAILY 08/20/20   Tonia Ghent, MD  furosemide (LASIX) 20 MG tablet Take 1 tablet (20 mg total) by mouth daily. 09/16/20   Tonia Ghent, MD  hydrochlorothiazide (MICROZIDE) 12.5 MG capsule TAKE 1 CAPSULE BY MOUTH IN  THE MORNING 09/15/20   Tonia Ghent, MD  levothyroxine (SYNTHROID) 50 MCG tablet TAKE 1 TABLET BY MOUTH  DAILY EXCEPT 2 TABLETS ON  SUNDAYS AND WEDNESDAYS (  TOTAL 9 TABS PER WEEK) 08/20/20   Tonia Ghent, MD  methocarbamol (ROBAXIN) 500 MG tablet TAKE 1 TABLET  BY MOUTH EVERY 8 HOURS AS NEEDED FOR MUSCLE SPASM 01/09/21   Tonia Ghent, MD  metoprolol tartrate (LOPRESSOR) 100 MG tablet TAKE ONE-HALF TABLET BY  MOUTH IN THE MORNING AND 1  TABLET BY MOUTH IN THE  EVENING 09/15/20   Tonia Ghent, MD  oxyCODONE-acetaminophen (PERCOCET) 5-325 MG tablet Take 1 tablet by mouth every 6 (six) hours as needed for severe pain. Patient not taking: Reported on 01/12/2021 12/04/20   Barbie Banner, PA-C  rosuvastatin (CRESTOR) 20 MG tablet Take 1 tablet (20 mg total) by mouth daily. 10/20/20 10/15/21  Cantwell, Anderson Malta C, PA-C  SYMBICORT 160-4.5 MCG/ACT inhaler INHALE 2 INHALATIONS BY  MOUTH INTO THE LUNGS TWICE  DAILY RINSE AFTER USE 11/19/20   Tonia Ghent, MD    Allergies    Aspirin, Codeine, Ibuprofen, Lipitor [atorvastatin], and Other  Review of Systems   Review of Systems  Constitutional:  Negative for fever.  HENT:  Negative for ear pain.   Eyes:  Negative for pain.  Respiratory:  Positive for shortness of breath. Negative for cough.   Cardiovascular:  Negative for chest pain.  Gastrointestinal:  Negative for abdominal pain.  Genitourinary:  Negative for flank pain.  Musculoskeletal:  Negative for back pain.  Skin:  Negative for rash.  Neurological:  Negative for headaches.   Physical  Exam Updated Vital Signs BP (!) 164/64 (BP Location: Right Arm)    Pulse 88    Resp (!) 25    SpO2 97%   Physical Exam Constitutional:      General: She is not in acute distress.    Appearance: Normal appearance.  HENT:     Head: Normocephalic.     Nose: Nose normal.  Eyes:     Extraocular Movements: Extraocular movements intact.  Cardiovascular:     Rate and Rhythm: Normal rate.  Pulmonary:     Effort: Respiratory distress present.     Breath sounds: Wheezing present.     Comments: Speech is moderately clipped due to shortness of breath. Musculoskeletal:        General: Normal range of motion.     Cervical back: Normal range of motion.  Neurological:     General: No focal deficit present.     Mental Status: She is alert. Mental status is at baseline.    ED Results / Procedures / Treatments   Labs (all labs ordered are listed, but only abnormal results are displayed) Labs Reviewed  CBC WITH DIFFERENTIAL/PLATELET - Abnormal; Notable for the following components:      Result Value   Hemoglobin 15.2 (*)    All other components within normal limits  COMPREHENSIVE METABOLIC PANEL - Abnormal; Notable for the following components:   Glucose, Bld 126 (*)    Total Protein 6.3 (*)    Total Bilirubin 1.3 (*)    GFR, Estimated 58 (*)    All other components within normal limits  BRAIN NATRIURETIC PEPTIDE - Abnormal; Notable for the following components:   B Natriuretic Peptide 246.7 (*)    All other components within normal limits  RESP PANEL BY RT-PCR (FLU A&B, COVID) ARPGX2  TROPONIN I (HIGH SENSITIVITY)    EKG EKG Interpretation  Date/Time:  Saturday February 06 2021 05:34:58 EST Ventricular Rate:  89 PR Interval:  194 QRS Duration: 78 QT Interval:  340 QTC Calculation: 414 R Axis:   85 Text Interpretation: Sinus rhythm Atrial premature complexes Biatrial enlargement Borderline right axis deviation Confirmed by Thamas Jaegers ([id number]) on  02/06/2021 7:18:56  AM  Radiology DG Chest Port 1 View  Result Date: 02/06/2021 CLINICAL DATA:  Shortness of breath EXAM: PORTABLE CHEST 1 VIEW COMPARISON:  06/05/2020 FINDINGS: Large lung volumes with interstitial coarsening. No nodule in the left lower lobe as seen on chest CT from November 2022. Artifact from EKG leads. Normal heart size and mediastinal contours. IMPRESSION: No acute finding when compared to priors. COPD. Electronically Signed   By: Jorje Guild M.D.   On: 02/06/2021 06:14    Procedures .Critical Care Performed by: Luna Fuse, MD Authorized by: Luna Fuse, MD   Critical care provider statement:    Critical care time (minutes):  40   Critical care time was exclusive of:  Separately billable procedures and treating other patients   Critical care was necessary to treat or prevent imminent or life-threatening deterioration of the following conditions:  Respiratory failure Comments:     Severe COPD exacerbation requiring continuous albuterol treatment.   Medications Ordered in ED Medications  ipratropium-albuterol (DUONEB) 0.5-2.5 (3) MG/3ML nebulizer solution 3 mL (has no administration in time range)  albuterol (PROVENTIL,VENTOLIN) solution continuous neb (has no administration in time range)  methylPREDNISolone sodium succinate (SOLU-MEDROL) 125 mg/2 mL injection 125 mg (125 mg Intravenous Given 02/06/21 0612)    ED Course  I have reviewed the triage vital signs and the nursing notes.  Pertinent labs & imaging results that were available during my care of the patient were reviewed by me and considered in my medical decision making (see chart for details).    MDM Rules/Calculators/A&P                         Patient arrives with vital signs within normal limits O2 saturation 97% on room air which is appropriate.  Wheezes on exam noted.  Given IV steroids and breathing treatments.  Labs and imaging ordered    Final Clinical Impression(s) / ED Diagnoses Final  diagnoses:  COPD exacerbation [facility])    Rx / DC Orders ED Discharge Orders     None        Luna Fuse, MD 02/06/21 [id number]    Luna Fuse, MD 02/06/21 [id number]    Luna Fuse, MD 02/06/21 [phone]

## 2021-02-06 NOTE — ED Notes (Signed)
DC instructions reviewed with pt. PT verbalized understanding. PT DC °

## 2021-02-07 ENCOUNTER — Telehealth: Payer: Self-pay | Admitting: Family Medicine

## 2021-02-07 NOTE — Telephone Encounter (Signed)
Patient need f/u scheduled at clinic in next 2 days.  Please schedule with me if possible or another doc/NP at the clinic.  Thanks.

## 2021-02-08 NOTE — Telephone Encounter (Signed)
[place] Night - Client Nonclinical Telephone Record  [facility] Client [facility] Night - Client Client Site [facility] - Night Provider Renford Dills - MD Contact Type Call Who Is Calling Patient / Member / Family / Caregiver Caller Name Corinne Phone Number [phone] Patient Name Jill Franco Patient DOB 03-21-1934 Call Type Message Only Information Provided Reason for Call Request to Schedule Office Appointment Initial Comment Caller states she would like to make an appointment, she was seen in the ER on Saturday. Patient request to speak to RN No Additional Comment Caller declined triage, transferred to the office successfully for further assistance. Disp. Time Disposition Final User 02/08/2021 8:08:18 AM General Information Provided Yes Steward Ros Call Closed By: Steward Ros Transaction Date/Time: 02/08/2021 8:05:22 AM (ET

## 2021-02-08 NOTE — Telephone Encounter (Signed)
Patient has appt already on 02/09/21 with Dr. Damita Dunnings.

## 2021-02-09 ENCOUNTER — Other Ambulatory Visit: Payer: Self-pay

## 2021-02-09 ENCOUNTER — Encounter: Payer: Self-pay | Admitting: Family Medicine

## 2021-02-09 ENCOUNTER — Ambulatory Visit (INDEPENDENT_AMBULATORY_CARE_PROVIDER_SITE_OTHER): Payer: Medicare Other | Admitting: Family Medicine

## 2021-02-09 VITALS — BP 112/68 | HR 69 | Temp 97.7°F | Ht 64.0 in | Wt 97.2 lb

## 2021-02-09 DIAGNOSIS — R0602 Shortness of breath: Secondary | ICD-10-CM

## 2021-02-09 DIAGNOSIS — J449 Chronic obstructive pulmonary disease, unspecified: Secondary | ICD-10-CM

## 2021-02-09 LAB — BASIC METABOLIC PANEL
BUN: 31 mg/dL — ABNORMAL HIGH (ref 6–23)
CO2: 31 mEq/L (ref 19–32)
Calcium: 10 mg/dL (ref 8.4–10.5)
Chloride: 94 mEq/L — ABNORMAL LOW (ref 96–112)
Creatinine, Ser: 1.05 mg/dL (ref 0.40–1.20)
GFR: 48.72 mL/min — ABNORMAL LOW (ref >60.00)
Glucose, Bld: 130 mg/dL — ABNORMAL HIGH (ref 70–99)
Potassium: 3.5 mEq/L (ref 3.5–5.1)
Sodium: 135 mEq/L (ref 135–145)

## 2021-02-09 MED ORDER — BUDESONIDE-FORMOTEROL FUMARATE 160-4.5 MCG/ACT IN AERO: 2.0000 | INHALATION_SPRAY | Freq: Two times a day (BID) | RESPIRATORY_TRACT | 3 refills | Status: DC

## 2021-02-09 MED ORDER — ALBUTEROL SULFATE HFA 108 (90 BASE) MCG/ACT IN AERS: 2.0000 | INHALATION_SPRAY | Freq: Four times a day (QID) | RESPIRATORY_TRACT | 5 refills | Status: DC | PRN

## 2021-02-09 MED ORDER — METHOCARBAMOL 500 MG PO TABS: ORAL_TABLET | ORAL | 3 refills | Status: DC

## 2021-02-09 NOTE — Progress Notes (Signed)
This visit occurred during the SARS-CoV-2 public health emergency.  Safety protocols were in place, including screening questions prior to the visit, additional usage of staff PPE, and extensive cleaning of exam room while observing appropriate contact time as indicated for disinfecting solutions.  To ER 02/06/21.  She was SOB and wheezing.  Given steroid and nebs and improved.  Was discharged. She had run out of symbicort in the meantime.  She isn't smoking in the meantime.    Recent mild BNP elevation d/w pt. She took extra dose of lasix for 2 days, back to baseline QD dosing now.   On doxycycline currently, along with prednisone.    "I'm 100% better."  She has white sputum now and feels better when getting that up.    Methocarbamol BID resolved cramping, d/w pt.  Would continue as is. Discussed.  Meds, vitals, and allergies reviewed.   ROS: Per HPI unless specifically indicated in ROS section   GEN: nad, alert and oriented HEENT: ncat NECK: supple w/o LA CV: rrr.  PULM: ctab, no inc wob, no wheeze, no rales. ABD: soft, +bs EXT: no edema SKIN: Well-perfused.

## 2021-02-09 NOTE — Patient Instructions (Signed)
Go to the lab on the way out.   If you have mychart we'll likely use that to update you.    Don't change your meds for now.  Finish the doxycycline and prednisone.  Take the prednisone with food.   I'll check with staff here about help with your inhaler cost. Take care.  Glad to see you.

## 2021-02-10 DIAGNOSIS — J449 Chronic obstructive pulmonary disease, unspecified: Secondary | ICD-10-CM | POA: Insufficient documentation

## 2021-02-10 DIAGNOSIS — J441 Chronic obstructive pulmonary disease with (acute) exacerbation: Secondary | ICD-10-CM | POA: Insufficient documentation

## 2021-02-10 NOTE — Assessment & Plan Note (Signed)
We talked about pathophysiology of COPD exacerbation and the rationale for smoking cessation, prednisone treatment, use of antibiotics.  We talked about concurrent fluid retention and mild BNP elevation and rationale for diuretic use.  Reasonable to finish prednisone, continue using Symbicort, and finish her antibiotics.  I thanked her for smoking cessation.  I will check with pharmacy to see about getting help paying for inhalers as they are expensive.  She is back to her routine daily dose of furosemide and I would continue that for now.  Reasonable to recheck labs today.  See notes on labs.  Okay for outpatient follow-up.  Lungs are clear and she feels better.  30 minutes were devoted to patient care in this encounter (this includes time spent reviewing the patient's file/history, interviewing and examining the patient, counseling/reviewing plan with patient).

## 2021-02-16 ENCOUNTER — Telehealth: Payer: Self-pay | Admitting: Family Medicine

## 2021-02-16 DIAGNOSIS — R0600 Dyspnea, unspecified: Secondary | ICD-10-CM

## 2021-02-16 MED ORDER — FUROSEMIDE 20 MG PO TABS: 20.0000 mg | ORAL_TABLET | Freq: Every day | ORAL | 1 refills | Status: DC

## 2021-02-16 NOTE — Telephone Encounter (Signed)
Erx sent

## 2021-02-16 NOTE — Telephone Encounter (Signed)
°  Encourage patient to contact the pharmacy for refills or they can request refills through [place]:  Please schedule appointment if longer than 1 year  NEXT APPOINTMENT DATE:06/01/21  MEDICATION:furosemide (LASIX) 20 MG tablet  Is the patient out of medication?   [facility] ([facility] - [place] [place]  Let patient know to contact pharmacy at the end of the day to make sure medication is ready.  Please notify patient to allow 48-72 hours to process  CLINICAL FILLS OUT ALL BELOW:   LAST REFILL:  QTY:  REFILL DATE:    OTHER COMMENTS:    Okay for refill?  Please advise

## 2021-03-22 ENCOUNTER — Telehealth: Payer: Self-pay

## 2021-03-22 NOTE — Telephone Encounter (Signed)
I spoke with pt; pt said that she has had problems with her feet swelling for years. Pt said this time is different because feet are swelling for last couple of weeks and do not go down overnight. Pt said she has redness on side of rt foot but has had that for 1 yr or more. Pt said when in hospital having vein surgery pts furosemide was increased to 40 mg daily. Pt said when she was discharged from hospital pt was advised to go back to furosemide 20 mg daily. Pt said she has been taking furosemide 20 mg daily but not taking care of swelling this time.  Pt said she does not have pain in feet but has lower leg pains but pt was advised that was due to recent vein surgery. Pt had FU visit with vascular surgeon Nov 2022 and pt said she has not had any CP.SOB.H/A or dizziness. Pt does not want to schedule appt to be seen and pt thought Dr Damita Dunnings would tell her what to take without being seen. Pt request cb after reviewed by Dr Damita Dunnings. UC & ED precautions given and pt voiced understanding. Pt last seen by Dr Damita Dunnings as ED FU on 02/09/2021.[facility]. Sending note to Dr Damita Dunnings and Janett Billow CMA and will teams Janett Billow also.

## 2021-03-22 NOTE — Telephone Encounter (Signed)
Spoke with patient and advised patient to take an extra 20 mg of lasix in the morning for 1-2 days and let us know how her swelling is. Advised we need to get her rechecked in the next couple of days if possible. Patient verbalized understanding.

## 2021-03-22 NOTE — Telephone Encounter (Signed)
I would try taking an extra 20mg  of lasix in the AM (ie take 40mg ) for one to two days and see if that makes a difference.  I wouldn't take it tonight as it may increase nighttime urination. Would need recheck here in clinic when possible.  Thanks.

## 2021-03-22 NOTE — Telephone Encounter (Signed)
[facility] - Client TELEPHONE ADVICE RECORD [facility] Patient Name: Jill Franco Gender: Female DOB: 1935/01/09 Age: 87 Y 9 M 30 D Return Phone Number: [phone] (Primary) Address: City/ State/ Zip: [place]  [address] Client [facility] - Client Client Site [place] - Day Provider Renford Dills - MD Contact Type Call Who Is Calling Patient / Member / Family / Caregiver Call Type Triage / Clinical Relationship To Patient Self Return Phone Number [phone] (Primary) Chief Complaint Leg Pain Reason for Call Symptomatic / Request for [place] states she has a pt on the line. Caller state she has swelling in both feet and need advice. Translation No Nurse Assessment Nurse: Ahorlu, RN, Shellia Cleverly Date/Time (Eastern Time): 03/22/2021 2:02:54 PM Confirm and document reason for call. If symptomatic, describe symptoms. ---caller states she has swelling in bilateral feet x 2 weeks taking 20mg  lasix daily but not working. Does the patient have any new or worsening symptoms? ---Yes Will a triage be completed? ---Yes Related visit to physician within the last 2 weeks? ---No Does the PT have any chronic conditions? (i.e. diabetes, asthma, this includes High risk factors for pregnancy, etc.) ---Yes List chronic conditions. ---hypertension, pvd Is this a behavioral health or substance abuse call? ---No Guidelines Guideline Title Affirmed Question Affirmed Notes Nurse Date/Time Eilene Ghazi Time) Leg Swelling and Edema [1] MILD swelling of both ankles (i.e., pedal edema) AND [2] is a chronic symptom (recurrent or ongoing AND present > 4 weeks) Ahorlu, RN, Shellia Cleverly 03/22/2021 2:05:42 PM Disp. Time Eilene Ghazi Time) Disposition Final User 03/22/2021 2:16:05 PM See PCP within 2 Weeks Yes Ahorlu, RN, Shellia Cleverly PLEASE NOTE: All timestamps contained within this  report are represented as [place] Standard Time. CONFIDENTIALTY NOTICE: This fax transmission is intended only for the addressee. It contains information that is legally privileged, confidential or otherwise protected from use or disclosure. If you are not the intended recipient, you are strictly prohibited from reviewing, disclosing, copying using or disseminating any of this information or taking any action in reliance on or regarding this information. If you have received this fax in error, please notify us immediately by telephone so that we can arrange for its return to [place]. Phone: [phone], Toll-Free: [phone], Fax: [phone] Page: 2 of 2 Call Id: [id number] [place] Disagree/Comply Comply Caller Understands Yes PreDisposition [place] Advice Given Per Guideline SEE PCP WITHIN 2 WEEKS: * You need to be seen for this ongoing problem within the next 2 weeks. * PCP VISIT: Call your doctor (or NP/PA) during regular office hours and make an appointment. CHRONIC LEG SWELLING AND EDEMA: * Elevate your legs or try to lie down one or two times a day for 20 minutes. * Try to avoid standing still for long periods of time. A daily walk is good for you and can also help reduce leg swelling. CALL BACK IF: * You become worse CARE ADVICE given per Leg Swelling and Edema (Adult) guideline. Referrals REFERRED TO PCP OFFIC

## 2021-03-24 NOTE — Telephone Encounter (Signed)
Patient made appt to be rechecked on 03/25/21 at 12pm

## 2021-03-25 ENCOUNTER — Other Ambulatory Visit: Payer: Self-pay

## 2021-03-25 ENCOUNTER — Ambulatory Visit (INDEPENDENT_AMBULATORY_CARE_PROVIDER_SITE_OTHER): Payer: Medicare Other | Admitting: Family Medicine

## 2021-03-25 ENCOUNTER — Encounter: Payer: Self-pay | Admitting: Family Medicine

## 2021-03-25 VITALS — BP 118/78 | HR 62 | Temp 97.6°F | Ht 64.0 in | Wt 107.0 lb

## 2021-03-25 DIAGNOSIS — R0600 Dyspnea, unspecified: Secondary | ICD-10-CM

## 2021-03-25 DIAGNOSIS — R609 Edema, unspecified: Secondary | ICD-10-CM | POA: Diagnosis not present

## 2021-03-25 LAB — BASIC METABOLIC PANEL
BUN: 22 mg/dL (ref 6–23)
CO2: 33 mEq/L — ABNORMAL HIGH (ref 19–32)
Calcium: 9.3 mg/dL (ref 8.4–10.5)
Chloride: 99 mEq/L (ref 96–112)
Creatinine, Ser: 0.82 mg/dL (ref 0.40–1.20)
GFR: 65.49 mL/min (ref >60.00)
Glucose, Bld: 123 mg/dL — ABNORMAL HIGH (ref 70–99)
Potassium: 3.7 mEq/L (ref 3.5–5.1)
Sodium: 137 mEq/L (ref 135–145)

## 2021-03-25 MED ORDER — HYDROCHLOROTHIAZIDE 12.5 MG PO CAPS: ORAL_CAPSULE | ORAL | Status: DC

## 2021-03-25 MED ORDER — FUROSEMIDE 20 MG PO TABS: 20.0000 mg | ORAL_TABLET | Freq: Every day | ORAL | Status: DC

## 2021-03-25 NOTE — Patient Instructions (Addendum)
Go to the lab on the way out.   If you have mychart we'll likely use that to update you.     I would take an extra dose of furosemide today.   Check your feet each AM.  I would hold HCTZ for now.   If your feet are still puffy tomorrow AM, then take 2 tabs of furosemide.  If swelling is clearly better, then take 1 tab of furosemide.   Try to keep your legs up when possible.    See if changing your position in the recliner helps your legs pain.  I sent a message to the vascular clinic about moving your appointment sooner.    Update me in a few days, sooner if needed.

## 2021-03-25 NOTE — Progress Notes (Signed)
This visit occurred during the SARS-CoV-2 public health emergency.  Safety protocols were in place, including screening questions prior to the visit, additional usage of staff PPE, and extensive cleaning of exam room while observing appropriate contact time as indicated for disinfecting solutions.  BLE edema. Noted in the last 2 weeks.  Still still taking lasix 20mg  a day and HCTZ 12.5mg  a day.  Breathing is still good.  No CP.  No wheeze. Sleeping on 1 pillow with a flat bed.  Not SOB at night laying down.  Weight is up.  Some paresthesias episodically in the feet.  R leg pain from the hip down to the foot, in the day and at night.  Pain is not worse walking.  She has h/o PAD noted.  She clearly felt better after prev vascular tx then the pain came back after the fact.  No pain currently.    She had been taking robaxin for cramps and it helped.  Stopping that med didn't help with edema.   She isn't smoking, I thanked her for her effort.  She is still on symbicort.  No recent SABA use.    Meds, vitals, and allergies reviewed.   ROS: Per HPI unless specifically indicated in ROS section   Nad Ncat Neck supple, no LA Ctab Rrr 2+ BLE edema and unable to palpate DP pulses but that could be from BLE.  Normal cap refill w/o ulceration.    30 minutes were devoted to patient care in this encounter (this includes time spent reviewing the patient's file/history, interviewing and examining the patient, counseling/reviewing plan with patient).

## 2021-03-28 DIAGNOSIS — R609 Edema, unspecified: Secondary | ICD-10-CM | POA: Insufficient documentation

## 2021-03-28 NOTE — Assessment & Plan Note (Signed)
See notes on labs for  I would take an extra dose of furosemide today.   I asked her to check her feet each AM.  I would hold HCTZ for now.   If her feet are still puffy tomorrow AM, then take 2 tabs of furosemide.  If swelling is clearly better, then take 1 tab of furosemide.   I asked her to try to keep her legs up when possible.    I want her to see if changing her position in the recliner helps her leg pain.   I sent a message to the vascular clinic about moving her appointment sooner and I appreciate the help of all involved.  I asked her to update me in a few days, sooner if needed.

## 2021-03-29 ENCOUNTER — Ambulatory Visit: Payer: Medicare Other | Admitting: Student

## 2021-03-29 ENCOUNTER — Other Ambulatory Visit: Payer: Self-pay

## 2021-03-29 ENCOUNTER — Encounter: Payer: Self-pay | Admitting: Student

## 2021-03-29 ENCOUNTER — Telehealth: Payer: Self-pay

## 2021-03-29 VITALS — BP 137/64 | HR 57 | Temp 97.8°F | Ht 64.0 in | Wt 107.0 lb

## 2021-03-29 DIAGNOSIS — R600 Localized edema: Secondary | ICD-10-CM

## 2021-03-29 DIAGNOSIS — I6529 Occlusion and stenosis of unspecified carotid artery: Secondary | ICD-10-CM | POA: Diagnosis not present

## 2021-03-29 DIAGNOSIS — I739 Peripheral vascular disease, unspecified: Secondary | ICD-10-CM | POA: Diagnosis not present

## 2021-03-29 DIAGNOSIS — I5189 Other ill-defined heart diseases: Secondary | ICD-10-CM | POA: Diagnosis not present

## 2021-03-29 MED ORDER — ASPIRIN EC 81 MG PO TBEC: 81.0000 mg | DELAYED_RELEASE_TABLET | Freq: Every day | ORAL | 3 refills | Status: AC

## 2021-03-29 NOTE — Patient Instructions (Addendum)
Take Lasix 40 mg once daily for the next 5 days.  Then return to taking Lasix 20 mg once daily Have lab work done at Dr. Josefine Class office in 1 week Stop hydrochlorothiazide Restart aspirin 81 mg daily

## 2021-03-29 NOTE — Progress Notes (Signed)
Thank you Dr. Damita Dunnings. I saw patient this morning. Given continued edema I have advised her to continue increased Lasix with repeat labs in 1 week. Also restarted ASA with Plavix for PAD. I will see her again in my office in 2 weeks for further management.

## 2021-03-29 NOTE — Addendum Note (Signed)
Addended by: Tonia Ghent on: 03/29/2021 04:58 PM   Modules accepted: Orders

## 2021-03-29 NOTE — Telephone Encounter (Addendum)
I put in the follow up lab orders so we can get those done.  I would try the higher dose of lasix as advised (40mg ) and see how that goes.  If needed, ie if no effect at 40mg  per day, she could try 60mg  per day for 2 days.  Then would back down to 40mg .  Please update me in about 1 week, when she gets labs done.  Please help her get a lab visit scheduled.  Thanks.

## 2021-03-29 NOTE — Telephone Encounter (Signed)
Called patient to let her know that we can not do labs unless ordered by Dr. Damita Dunnings. Are you ok with ordering labs from Cardiology so that can have done out our office?   Patient wanted to let you know that she has not had any improvement in swelling in legs. She has increased laxix to 40mg  daily after she was seen in office by you.

## 2021-03-29 NOTE — Progress Notes (Signed)
Primary Physician/Referring:  Tonia Ghent, MD  Patient ID: Jill Franco, female    DOB: Mar 01, 1934, 87 y.o.   MRN: [id number]  Chief Complaint  Patient presents with   Edema   Hyperlipidemia   PAD   Follow-up   HPI:    Jill Franco  is a 87 y.o. Caucasian female with history of hypertension, hyperlipidemia, PAD, tobacco use (smokes about half pack per day), patient reported history of CVA (unclear when), COPD, and hypothyroidism.  Denies history of diabetes, MI. Referred to our office for preoperative risk stratification by Dr. Fuller Song (vascular surgery) as patient has planned upcoming left femoral endarterectomy with retrograde iliac stenting.   Patient was admitted December 2022 with COPD exacerbation.  She has subsequently followed up with PCP, most recently on 03/25/2021 at which time she had bilateral leg edema that had been worsening since the end of December 2022.  PCP advised patient to increase Lasix from 20 mg to 40 mg daily and to follow-up with our office.  She now presents for follow-up.  Patient continues to have bilateral ankle and foot edema.  She also reports worsening leg pain both with components of true claudication and pseudoclaudication.  Suspect leg pain is multifactorial including underlying MSK etiology.  Notably patient is currently only taking Plavix, not aspirin.  Patient denies resting leg pain and has no ulcers or wounds.  She states pain is worse in the right leg than the left.  Past Medical History:  Diagnosis Date   Cataract    bilaterally corrected.   COPD (chronic obstructive pulmonary disease) ([place])    CVA (cerebral vascular accident) ([place])    Pt was unaware and does not know when it happened.  Was told after some testing was done.   Double vision    Dyspnea    Facial droop 10/2017   left   History of shingles 2012   HLD (hyperlipidemia)    HTN (hypertension)    Hypothyroidism    PVD (peripheral vascular disease) ([place])     Vision loss    temporary   Past Surgical History:  Procedure Laterality Date   ABDOMINAL EXPLORATION SURGERY  1950s   "they thought I was pregnant; went in to explore"   APPENDECTOMY  childhood   BACK SURGERY     BREAST LUMPECTOMY Left 1960's   benign   carotid ultrasound  06/19/2007   0-39% stenosis   CATARACT EXTRACTION W/ INTRAOCULAR LENS IMPLANT Left 09/2016   CATARACT EXTRACTION W/ INTRAOCULAR LENS IMPLANT Right 10/2016   COLONOSCOPY WITH PROPOFOL N/A 04/08/2019   Procedure: COLONOSCOPY WITH PROPOFOL;  Surgeon: Milus Banister, MD;  Location: [facility];  Service: Endoscopy;  Laterality: N/A;   DILATION AND CURETTAGE OF UTERUS  1960's X 3   miscarriages   DOPPLER ECHOCARDIOGRAPHY  06/19/2007   Normal EF 55-70%   ENDARTERECTOMY FEMORAL Left 12/03/2020   Procedure: LEFT FEMORAL ENDARTERECTOMY AND PROFUNDAPLASTY;  Surgeon: [name], MD;  Location: [facility];  Service: Vascular;  Laterality: Left;   ILIAC ARTERY STENT Left 03/18/2016   common iliac stent (6 x 40), aortic stent (10 x 19)/notes 03/18/2016   INSERTION OF ILIAC STENT Left 12/03/2020   Procedure: AORTIC STENTING, INSERTION OF BILATERAL COMMON ILIAC STENTS, AND LEFT EXTERNAL ILIAC STENT;  Surgeon: [name], MD;  Location: [facility];  Service: Vascular;  Laterality: Left;   LAMINECTOMY AND MICRODISCECTOMY SPINE  1960's X 2   LOWER EXTREMITY ANGIOGRAM N/A 12/03/2020  Procedure: Andris Baumann;  Surgeon: [name], MD;  Location: [facility] OR;  Service: Vascular;  Laterality: N/A;   MOLE REMOVAL  07/10/2016   NSVD     x1   PATCH ANGIOPLASTY Left 12/03/2020   Procedure: PATCH ANGIOPLASTY OF LEFT COMMON FEMORAL ARTERY USING BOVINE PERICARDIUM PATCH;  Surgeon: [name], MD;  Location: [facility];  Service: Vascular;  Laterality: Left;   PERIPHERAL VASCULAR CATHETERIZATION N/A 03/18/2016   Procedure: Abdominal Aortogram w/Lower Extremity;  Surgeon: Elam Dutch, MD;  Location: [facility];  Service:  Cardiovascular;  Laterality: N/A;   PERIPHERAL VASCULAR CATHETERIZATION  03/18/2016   Procedure: Peripheral Vascular Intervention;  Surgeon: Elam Dutch, MD;  Location: [facility];  Service: Cardiovascular;;   POLYPECTOMY  04/08/2019   Procedure: POLYPECTOMY;  Surgeon: Milus Banister, MD;  Location: [facility];  Service: Endoscopy;;   ULTRASOUND GUIDANCE FOR VASCULAR ACCESS Right 12/03/2020   Procedure: ULTRASOUND GUIDANCE FOR VASCULAR ACCESS, RIGHT FEMORAL ARTERY;  Surgeon: [name], MD;  Location: [facility];  Service: Vascular;  Laterality: Right;   Family History  Problem Relation Age of Onset   Stroke Father 12   Cancer Brother    Colon cancer Neg Hx    Breast cancer Neg Hx    Colon polyps Neg Hx    Esophageal cancer Neg Hx    Rectal cancer Neg Hx    Stomach cancer Neg Hx     Social History   Tobacco Use   Smoking status: Every Day    Packs/day: 0.25    Years: 69.00    Pack years: 17.25    Types: Cigarettes    Last attempt to quit: 04/21/2019    Years since quitting: 1.9   Smokeless tobacco: Never  Substance Use Topics   Alcohol use: No    Alcohol/week: 0.0 standard drinks   Marital Status: Widowed   ROS  Review of Systems  Cardiovascular:  Positive for claudication (worsening since last visit with vascular surgery), dyspnea on exertion (stable) and leg swelling (worse over the last 4 weeks). Negative for chest pain, near-syncope, orthopnea, palpitations, paroxysmal nocturnal dyspnea and syncope.   Objective  Blood pressure 137/64, pulse (!) 57, temperature 97.8 F (36.6 C), height 5\' 4"  (1.626 m), weight 107 lb (48.5 kg), SpO2 99 %.  Vitals with BMI 03/29/2021 03/25/2021 02/09/2021  Height 5\' 4"  5\' 4"  5\' 4"   Weight 107 lbs 107 lbs 97 lbs 3 oz  BMI 18.36 02.40 97.35  Systolic 329 924 268  Diastolic 64 78 68  Pulse 57 62 69      Physical Exam Vitals reviewed.  Constitutional:      Comments: Frail  Cardiovascular:     Rate and Rhythm: Normal  rate and regular rhythm.     Pulses: Intact distal pulses.          Dorsalis pedis pulses are 0 on the right side and 0 on the left side.       Posterior tibial pulses are 0 on the right side and 0 on the left side.     Heart sounds: S1 normal and S2 normal. No murmur heard.   No gallop.     Comments: No evidence of open wounds or ulcers. Right foot cooler to touch than left.  Pulmonary:     Effort: Pulmonary effort is normal.     Breath sounds: Decreased air movement (bilaterally throughout) present.  Musculoskeletal:     Right lower leg:  Edema (1+ pitting to ankle) present.     Left lower leg: Edema (1+ pitting to ankle) present.  Skin:    Capillary Refill: Capillary refill takes 2 to 3 seconds.  Neurological:     Mental Status: She is alert.    Laboratory examination:   Recent Labs    12/03/20 1225 12/04/20 0320 02/06/21 0609 02/09/21 1244 03/25/21 1252  NA  --  134* 138 135 137  K  --  3.4* 4.8 3.5 3.7  CL  --  101 100 94* 99  CO2  --  27 28 31  33*  GLUCOSE  --  154* 126* 130* 123*  BUN  --  14 21 31* 22  CREATININE 0.75 0.87 0.97 1.05 0.82  CALCIUM  --  8.4* 9.3 10.0 9.3  GFRNONAA >60 >60 58*  --   --    estimated creatinine clearance is 39.1 mL/min (by C-G formula based on SCr of 0.82 mg/dL).  CMP Latest Ref Rng & Units 03/25/2021 02/09/2021 02/06/2021  Glucose 70 - 99 mg/dL 123(H) 130(H) 126(H)  BUN 6 - 23 mg/dL 22 31(H) 21  Creatinine 0.40 - 1.20 mg/dL 0.82 1.05 0.97  Sodium 135 - 145 mEq/L 137 135 138  Potassium 3.5 - 5.1 mEq/L 3.7 3.5 4.8  Chloride 96 - 112 mEq/L 99 94(L) 100  CO2 19 - 32 mEq/L 33(H) 31 28  Calcium 8.4 - 10.5 mg/dL 9.3 10.0 9.3  Total Protein 6.5 - 8.1 g/dL - - 6.3(L)  Total Bilirubin 0.3 - 1.2 mg/dL - - 1.3(H)  Alkaline Phos 38 - 126 U/L - - 66  AST 15 - 41 U/L - - 37  ALT 0 - 44 U/L - - 16   CBC Latest Ref Rng & Units 02/06/2021 12/04/2020 12/03/2020  WBC 4.0 - 10.5 K/uL 7.6 12.0(H) 11.0(H)  Hemoglobin 12.0 - 15.0 g/dL 15.2(H) 12.1  13.6  Hematocrit 36.0 - 46.0 % 45.3 35.0(L) 40.4  Platelets 150 - 400 K/uL 225 149(L) 162    Lipid Panel Recent Labs    09/18/20 0918 12/04/20 0320  CHOL 175 114  TRIG 99.0 37  LDLCALC 84 36  VLDL 19.8 7  HDL 71.20 71  CHOLHDL 2 1.6    HEMOGLOBIN A1C Lab Results  Component Value Date   HGBA1C 5.7 09/18/2020   TSH Recent Labs    06/16/20 1241  TSH 2.82    External labs:   None   Allergies   Allergies  Allergen Reactions   Aspirin Other (See Comments)    REACTION: Uncoated Stomach upset on empty stomach   Codeine Nausea Only and Other (See Comments)    Pill needs to be E.coated   Ibuprofen Nausea And Vomiting   Lipitor [Atorvastatin] Other (See Comments)    myalgia   Other Itching and Rash    Nicotine patches     Medications Prior to Visit:   Outpatient Medications Prior to Visit  Medication Sig Dispense Refill   albuterol (PROAIR HFA) 108 (90 Base) MCG/ACT inhaler Inhale 2 puffs into the lungs every 6 (six) hours as needed for wheezing or shortness of breath. 18 g 5   budesonide-formoterol (SYMBICORT) 160-4.5 MCG/ACT inhaler Inhale 2 puffs into the lungs in the morning and at bedtime. 30.6 g 3   Cholecalciferol (VITAMIN D3) 25 MCG (1000 UT) CAPS Take 1 capsule (1,000 Units total) by mouth daily.     clopidogrel (PLAVIX) 75 MG tablet TAKE 1 TABLET BY MOUTH  DAILY 90 tablet 3  diphenhydrAMINE (BENADRYL) 25 MG tablet Take 25 mg by mouth at bedtime as needed for sleep.     enalapril (VASOTEC) 10 MG tablet TAKE 1 AND 1/2 TABLETS BY  MOUTH DAILY 135 tablet 3   furosemide (LASIX) 20 MG tablet Take 1-2 tablets (20-40 mg total) by mouth daily.     levothyroxine (SYNTHROID) 50 MCG tablet TAKE 1 TABLET BY MOUTH  DAILY EXCEPT 2 TABLETS ON  SUNDAYS AND WEDNESDAYS (  TOTAL 9 TABS PER WEEK) 117 tablet 3   methocarbamol (ROBAXIN) 500 MG tablet TAKE 1 TABLET BY MOUTH EVERY 12 HOURS AS NEEDED FOR MUSCLE SPASM 180 tablet 3   metoprolol tartrate (LOPRESSOR) 100 MG tablet TAKE  ONE-HALF TABLET BY  MOUTH IN THE MORNING AND 1  TABLET BY MOUTH IN THE  EVENING 135 tablet 3   rosuvastatin (CRESTOR) 20 MG tablet Take 1 tablet (20 mg total) by mouth daily. 90 tablet 3   hydrochlorothiazide (MICROZIDE) 12.5 MG capsule Held as of 03/25/21 (Patient not taking: Reported on 03/29/2021)     No facility-administered medications prior to visit.   Final Medications at End of Visit    Current Meds  Medication Sig   albuterol (PROAIR HFA) 108 (90 Base) MCG/ACT inhaler Inhale 2 puffs into the lungs every 6 (six) hours as needed for wheezing or shortness of breath.   aspirin EC 81 MG tablet Take 1 tablet (81 mg total) by mouth daily. Swallow whole.   budesonide-formoterol (SYMBICORT) 160-4.5 MCG/ACT inhaler Inhale 2 puffs into the lungs in the morning and at bedtime.   Cholecalciferol (VITAMIN D3) 25 MCG (1000 UT) CAPS Take 1 capsule (1,000 Units total) by mouth daily.   clopidogrel (PLAVIX) 75 MG tablet TAKE 1 TABLET BY MOUTH  DAILY   diphenhydrAMINE (BENADRYL) 25 MG tablet Take 25 mg by mouth at bedtime as needed for sleep.   enalapril (VASOTEC) 10 MG tablet TAKE 1 AND 1/2 TABLETS BY  MOUTH DAILY   furosemide (LASIX) 20 MG tablet Take 1-2 tablets (20-40 mg total) by mouth daily.   levothyroxine (SYNTHROID) 50 MCG tablet TAKE 1 TABLET BY MOUTH  DAILY EXCEPT 2 TABLETS ON  SUNDAYS AND WEDNESDAYS (  TOTAL 9 TABS PER WEEK)   methocarbamol (ROBAXIN) 500 MG tablet TAKE 1 TABLET BY MOUTH EVERY 12 HOURS AS NEEDED FOR MUSCLE SPASM   metoprolol tartrate (LOPRESSOR) 100 MG tablet TAKE ONE-HALF TABLET BY  MOUTH IN THE MORNING AND 1  TABLET BY MOUTH IN THE  EVENING   rosuvastatin (CRESTOR) 20 MG tablet Take 1 tablet (20 mg total) by mouth daily.   Radiology:   No results found.  Cardiac Studies:   Carotid Duplex 04/26/2019:  Right Carotid: Velocities in the right ICA are consistent with a 1-39%  stenosis. Non-hemodynamically significant plaque <50% noted in the  CCA. The ECA appears <50%  stenosed.  Left Carotid: Velocities in the left ICA are consistent with a 40-59%  stenosis. Non-hemodynamically significant plaque <50% noted in the  CCA. The ECA appears >50% stenosed.  Vertebrals:  Bilateral vertebral arteries demonstrate antegrade flow.  Subclavians: Normal flow hemodynamics were seen in bilateral subclavian arteries.   Vascular ultrasound aorta/IVC/iliacs 10/06/2020: IVC/Iliac: Suboptimal exam.  - Greater than 50% stenosis in the right CIA  - 50 - 99% stenosis distal to stent and proximal EIA, limited visualization  ABI 10/06/2020: Right: Resting right ankle-brachial index indicates moderate right lower extremity arterial disease. The right toe-brachial index is abnormal.  Left: Resting left ankle-brachial index indicates moderate  left lower extremity arterial disease. The left toe-brachial index is abnormal.  PCV ECHOCARDIOGRAM COMPLETE 11/18/2020 Left ventricle cavity is normal in size. Mild concentric hypertrophy of the left ventricle. Normal global wall motion. Normal LV systolic function with visual EF 50-55%. Doppler evidence of grade I (impaired) diastolic dysfunction, normal LAP. Mild tricuspid regurgitation. No evidence of pulmonary hypertension.   [facility] MYOCARDIAL PERFUSION WITH LEXISCAN 11/25/2020 Lexiscan nuclear stress test performed using 1-day protocol. LV cavity is small. Normal myocardial perfusion. Stress LVEF 51%. TID is 1.30. In absence of regional perfusion abnormalities, this is a nonspecific finding. Recommend clinical correlation. Personally reviewed with Dr. Einar Gip who feels stress test is overall low risk.  EKG:   03/29/2021: Sinus rhythm at a rate of 57 bpm.  Normal axis.  Left atrial enlargement.  Poor R wave progression, cannot include anteroseptal infarct old.  No evidence of ischemia or underlying injury pattern.  Compared to EKG 10/20/2020, no significant change.   Assessment     ICD-10-CM   1. PAD (peripheral artery disease) (HCC)  I73.9      2. Diastolic dysfunction  R42.70 EKG 12-Lead    3. Bilateral leg edema  W23.7 Basic metabolic panel    Brain natriuretic peptide       Medications Discontinued During This Encounter  Medication Reason   hydrochlorothiazide (MICROZIDE) 12.5 MG capsule Discontinued by provider    Meds ordered this encounter  Medications   aspirin EC 81 MG tablet    Sig: Take 1 tablet (81 mg total) by mouth daily. Swallow whole.    Dispense:  90 tablet    Refill:  3     Recommendations:   JANEISHA RYLE is a 87 y.o. Caucasian female with history of hypertension, hyperlipidemia, PAD, tobacco use (smokes about half pack per day), patient reported history of CVA (unclear when), COPD, and hypothyroidism.  Denies history of diabetes, MI. Referred to our office for preoperative risk stratification by Dr. Fuller Song (vascular surgery) as patient has planned upcoming left femoral endarterectomy with retrograde iliac stenting.   Patient presents for urgent visit at the request of her PCP given worsening bilateral leg pain as well as edema over the last several weeks.  Given leg edema advised patient to take Lasix 40 mg p.o. daily for the next 5 days.  We will repeat BMP and BNP in 1 week.   Suspect patient's worsening leg pain to be multifactorial including both musculoskeletal etiology as well as underlying PAD.  We will therefore resume aspirin 81 mg daily and continue Plavix.  There is no evidence of critical limb ischemia and patient has no rest pain.  Patient follows with vascular surgery for management of PAD.  We will plan to follow patient closely.  Follow-up in 2 weeks, sooner if needed, for PAD and leg edema.   Alethia Berthold, PA-C 03/29/2021, 12:43 PM Office: [phone]

## 2021-03-30 NOTE — Telephone Encounter (Signed)
Spoke with patient about medication doses and labs. Appt had been scheduled for 04/06/21 at 8:20 am.

## 2021-04-06 ENCOUNTER — Other Ambulatory Visit: Payer: Medicare Other

## 2021-04-06 DIAGNOSIS — R600 Localized edema: Secondary | ICD-10-CM | POA: Diagnosis not present

## 2021-04-07 LAB — BASIC METABOLIC PANEL
BUN/Creatinine Ratio: 25 (ref 12–28)
BUN: 23 mg/dL (ref 8–27)
CO2: 24 mmol/L (ref 20–29)
Calcium: 9.5 mg/dL (ref 8.7–10.3)
Chloride: 102 mmol/L (ref 96–106)
Creatinine, Ser: 0.92 mg/dL (ref 0.57–1.00)
Glucose: 88 mg/dL (ref 70–99)
Potassium: 4.8 mmol/L (ref 3.5–5.2)
Sodium: 143 mmol/L (ref 134–144)
eGFR: 61 mL/min/{1.73_m2} (ref >59)

## 2021-04-07 LAB — SPECIMEN STATUS REPORT

## 2021-04-07 LAB — BRAIN NATRIURETIC PEPTIDE: BNP: 159.6 pg/mL — ABNORMAL HIGH (ref 0.0–100.0)

## 2021-04-12 ENCOUNTER — Other Ambulatory Visit: Payer: Self-pay

## 2021-04-12 ENCOUNTER — Ambulatory Visit: Payer: Medicare Other | Admitting: Student

## 2021-04-12 ENCOUNTER — Telehealth: Payer: Self-pay | Admitting: Family Medicine

## 2021-04-12 ENCOUNTER — Encounter: Payer: Self-pay | Admitting: Student

## 2021-04-12 VITALS — BP 117/68 | HR 62 | Temp 97.5°F | Resp 17 | Ht 64.0 in | Wt 106.8 lb

## 2021-04-12 DIAGNOSIS — I739 Peripheral vascular disease, unspecified: Secondary | ICD-10-CM | POA: Diagnosis not present

## 2021-04-12 DIAGNOSIS — R600 Localized edema: Secondary | ICD-10-CM | POA: Diagnosis not present

## 2021-04-12 MED ORDER — RIVAROXABAN 2.5 MG PO TABS: 2.5000 mg | ORAL_TABLET | Freq: Two times a day (BID) | ORAL | 3 refills | Status: DC

## 2021-04-12 NOTE — Progress Notes (Signed)
Primary Physician/Referring:  Tonia Ghent, MD  Patient ID: Jill Franco, female    DOB: 18-Jan-1935, 87 y.o.   MRN: [id number]  Chief Complaint  Patient presents with   PAD   Leg Swelling    2 WEEKS   HPI:    Jill Franco  is a 87 y.o. Caucasian female with history of hypertension, hyperlipidemia, PAD, tobacco use (smokes about half pack per day), patient reported history of CVA (unclear when), COPD, and hypothyroidism.  Denies history of diabetes, MI. Referred to our office for preoperative risk stratification by Dr. Fuller Song (vascular surgery) as patient has planned upcoming left femoral endarterectomy with retrograde iliac stenting.   Patient presents for 2-week follow-up.  At last office visit given worsening bilateral leg edema advised patient to take Lasix 40 mg daily for 5 days.  Repeat BMP remained stable and BNP only mildly elevated at 159.  Last office visit also resumed aspirin 81 mg daily in addition to Plavix given underlying PAD.  However upon further discussion patient reports she only took aspirin for 5 days.  Leg swelling has improved, however leg pain remains unchanged.  Pain is worse in her right leg than her left, however pain is not both legs located primarily in the thighs.  This pain is worst when walking or lying down at night.  Notably patient reports she quit smoking in December 2022.  Past Medical History:  Diagnosis Date   Cataract    bilaterally corrected.   COPD (chronic obstructive pulmonary disease) ([place])    CVA (cerebral vascular accident) ([place])    Pt was unaware and does not know when it happened.  Was told after some testing was done.   Double vision    Dyspnea    Facial droop 10/2017   left   History of shingles 2012   HLD (hyperlipidemia)    HTN (hypertension)    Hypothyroidism    PVD (peripheral vascular disease) ([place])    Vision loss    temporary   Past Surgical History:  Procedure Laterality Date   ABDOMINAL EXPLORATION  SURGERY  1950s   "they thought I was pregnant; went in to explore"   APPENDECTOMY  childhood   BACK SURGERY     BREAST LUMPECTOMY Left 1960's   benign   carotid ultrasound  06/19/2007   0-39% stenosis   CATARACT EXTRACTION W/ INTRAOCULAR LENS IMPLANT Left 09/2016   CATARACT EXTRACTION W/ INTRAOCULAR LENS IMPLANT Right 10/2016   COLONOSCOPY WITH PROPOFOL N/A 04/08/2019   Procedure: COLONOSCOPY WITH PROPOFOL;  Surgeon: Milus Banister, MD;  Location: [facility];  Service: Endoscopy;  Laterality: N/A;   DILATION AND CURETTAGE OF UTERUS  1960's X 3   miscarriages   DOPPLER ECHOCARDIOGRAPHY  06/19/2007   Normal EF 55-70%   ENDARTERECTOMY FEMORAL Left 12/03/2020   Procedure: LEFT FEMORAL ENDARTERECTOMY AND PROFUNDAPLASTY;  Surgeon: [name], MD;  Location: [facility];  Service: Vascular;  Laterality: Left;   ILIAC ARTERY STENT Left 03/18/2016   common iliac stent (6 x 40), aortic stent (10 x 19)/notes 03/18/2016   INSERTION OF ILIAC STENT Left 12/03/2020   Procedure: AORTIC STENTING, INSERTION OF BILATERAL COMMON ILIAC STENTS, AND LEFT EXTERNAL ILIAC STENT;  Surgeon: [name], MD;  Location: [place];  Service: Vascular;  Laterality: Left;   LAMINECTOMY AND MICRODISCECTOMY SPINE  1960's X 2   LOWER EXTREMITY ANGIOGRAM N/A 12/03/2020   Procedure: Andris Baumann;  Surgeon: [name], MD;  Location:  [facility];  Service: Vascular;  Laterality: N/A;   MOLE REMOVAL  07/10/2016   NSVD     x1   PATCH ANGIOPLASTY Left 12/03/2020   Procedure: PATCH ANGIOPLASTY OF LEFT COMMON FEMORAL ARTERY USING BOVINE PERICARDIUM PATCH;  Surgeon: [name], MD;  Location: [facility];  Service: Vascular;  Laterality: Left;   PERIPHERAL VASCULAR CATHETERIZATION N/A 03/18/2016   Procedure: Abdominal Aortogram w/Lower Extremity;  Surgeon: Elam Dutch, MD;  Location: [facility];  Service: Cardiovascular;  Laterality: N/A;   PERIPHERAL VASCULAR CATHETERIZATION  03/18/2016   Procedure:  Peripheral Vascular Intervention;  Surgeon: Elam Dutch, MD;  Location: [facility];  Service: Cardiovascular;;   POLYPECTOMY  04/08/2019   Procedure: POLYPECTOMY;  Surgeon: Milus Banister, MD;  Location: [facility];  Service: Endoscopy;;   ULTRASOUND GUIDANCE FOR VASCULAR ACCESS Right 12/03/2020   Procedure: ULTRASOUND GUIDANCE FOR VASCULAR ACCESS, RIGHT FEMORAL ARTERY;  Surgeon: [name], MD;  Location: [facility];  Service: Vascular;  Laterality: Right;   Family History  Problem Relation Age of Onset   Stroke Father 39   Cancer Brother    Colon cancer Neg Hx    Breast cancer Neg Hx    Colon polyps Neg Hx    Esophageal cancer Neg Hx    Rectal cancer Neg Hx    Stomach cancer Neg Hx     Social History   Tobacco Use   Smoking status: Former    Packs/day: 0.25    Years: 69.00    Pack years: 17.25    Types: Cigarettes    Quit date: 01/2021    Years since quitting: 0.2   Smokeless tobacco: Never  Substance Use Topics   Alcohol use: No    Alcohol/week: 0.0 standard drinks   Marital Status: Widowed   ROS  Review of Systems  Cardiovascular:  Positive for claudication (worsening since last visit with vascular surgery) and dyspnea on exertion (stable). Negative for chest pain, leg swelling (resolved), near-syncope, orthopnea, palpitations, paroxysmal nocturnal dyspnea and syncope.   Objective  Blood pressure 117/68, pulse 62, temperature (!) 97.5 F (36.4 C), temperature source Temporal, resp. rate 17, height 5\' 4"  (1.626 m), weight 106 lb 12.8 oz (48.4 kg), SpO2 97 %.  Vitals with BMI 04/12/2021 03/29/2021 03/25/2021  Height 5\' 4"  5\' 4"  5\' 4"   Weight 106 lbs 13 oz 107 lbs 107 lbs  BMI 18.32 26.33 35.45  Systolic 625 638 937  Diastolic 68 64 78  Pulse 62 57 62      Physical Exam Vitals reviewed.  Constitutional:      Comments: Frail  Cardiovascular:     Rate and Rhythm: Normal rate and regular rhythm.     Pulses: Intact distal pulses.          Dorsalis  pedis pulses are 0 on the right side and 0 on the left side.       Posterior tibial pulses are 0 on the right side and 0 on the left side.     Heart sounds: S1 normal and S2 normal. No murmur heard.   No gallop.     Comments: No evidence of open wounds or ulcers. Right foot cooler to touch than left.  Pulmonary:     Effort: Pulmonary effort is normal.     Breath sounds: Decreased air movement (bilaterally throughout) present.  Musculoskeletal:     Right lower leg: No edema.     Left lower leg: No edema.  Skin:    Capillary Refill: Capillary refill takes 2 to 3 seconds.  Neurological:     Mental Status: She is alert.    Laboratory examination:   Recent Labs    12/03/20 1225 12/04/20 0320 02/06/21 0609 02/09/21 1244 03/25/21 1252 04/06/21 0000  NA  --  134* 138 135 137 143  K  --  3.4* 4.8 3.5 3.7 4.8  CL  --  101 100 94* 99 102  CO2  --  27 28 31  33* 24  GLUCOSE  --  154* 126* 130* 123* 88  BUN  --  14 21 31* 22 23  CREATININE 0.75 0.87 0.97 1.05 0.82 0.92  CALCIUM  --  8.4* 9.3 10.0 9.3 9.5  GFRNONAA >60 >60 58*  --   --   --    estimated creatinine clearance is 34.8 mL/min (by C-G formula based on SCr of 0.92 mg/dL).  CMP Latest Ref Rng & Units 04/06/2021 03/25/2021 02/09/2021  Glucose 70 - 99 mg/dL 88 123(H) 130(H)  BUN 8 - 27 mg/dL 23 22 31(H)  Creatinine 0.57 - 1.00 mg/dL 0.92 0.82 1.05  Sodium 134 - 144 mmol/L 143 137 135  Potassium 3.5 - 5.2 mmol/L 4.8 3.7 3.5  Chloride 96 - 106 mmol/L 102 99 94(L)  CO2 20 - 29 mmol/L 24 33(H) 31  Calcium 8.7 - 10.3 mg/dL 9.5 9.3 10.0  Total Protein 6.5 - 8.1 g/dL - - -  Total Bilirubin 0.3 - 1.2 mg/dL - - -  Alkaline Phos 38 - 126 U/L - - -  AST 15 - 41 U/L - - -  ALT 0 - 44 U/L - - -   CBC Latest Ref Rng & Units 02/06/2021 12/04/2020 12/03/2020  WBC 4.0 - 10.5 K/uL 7.6 12.0(H) 11.0(H)  Hemoglobin 12.0 - 15.0 g/dL 15.2(H) 12.1 13.6  Hematocrit 36.0 - 46.0 % 45.3 35.0(L) 40.4  Platelets 150 - 400 K/uL 225 149(L) 162     Lipid Panel Recent Labs    09/18/20 0918 12/04/20 0320  CHOL 175 114  TRIG 99.0 37  LDLCALC 84 36  VLDL 19.8 7  HDL 71.20 71  CHOLHDL 2 1.6    HEMOGLOBIN A1C Lab Results  Component Value Date   HGBA1C 5.7 09/18/2020   TSH Recent Labs    06/16/20 1241  TSH 2.82  External labs:   None   Allergies   Allergies  Allergen Reactions   Aspirin Other (See Comments)    REACTION: Uncoated Stomach upset on empty stomach   Codeine Nausea Only and Other (See Comments)    Pill needs to be E.coated   Ibuprofen Nausea And Vomiting   Lipitor [Atorvastatin] Other (See Comments)    myalgia   Other Itching and Rash    Nicotine patches     Medications Prior to Visit:   Outpatient Medications Prior to Visit  Medication Sig Dispense Refill   albuterol (PROAIR HFA) 108 (90 Base) MCG/ACT inhaler Inhale 2 puffs into the lungs every 6 (six) hours as needed for wheezing or shortness of breath. 18 g 5   aspirin EC 81 MG tablet Take 1 tablet (81 mg total) by mouth daily. Swallow whole. 90 tablet 3   budesonide-formoterol (SYMBICORT) 160-4.5 MCG/ACT inhaler Inhale 2 puffs into the lungs in the morning and at bedtime. 30.6 g 3   Cholecalciferol (VITAMIN D3) 25 MCG (1000 UT) CAPS Take 1 capsule (1,000 Units total) by mouth daily.     diphenhydrAMINE (BENADRYL) 25 MG tablet  Take 25 mg by mouth at bedtime as needed for sleep.     enalapril (VASOTEC) 10 MG tablet TAKE 1 AND 1/2 TABLETS BY  MOUTH DAILY 135 tablet 3   furosemide (LASIX) 20 MG tablet Take 1-2 tablets (20-40 mg total) by mouth daily.     levothyroxine (SYNTHROID) 50 MCG tablet TAKE 1 TABLET BY MOUTH  DAILY EXCEPT 2 TABLETS ON  SUNDAYS AND WEDNESDAYS (  TOTAL 9 TABS PER WEEK) 117 tablet 3   methocarbamol (ROBAXIN) 500 MG tablet TAKE 1 TABLET BY MOUTH EVERY 12 HOURS AS NEEDED FOR MUSCLE SPASM 180 tablet 3   metoprolol tartrate (LOPRESSOR) 100 MG tablet TAKE ONE-HALF TABLET BY  MOUTH IN THE MORNING AND 1  TABLET BY MOUTH IN THE   EVENING 135 tablet 3   rosuvastatin (CRESTOR) 20 MG tablet Take 1 tablet (20 mg total) by mouth daily. 90 tablet 3   clopidogrel (PLAVIX) 75 MG tablet TAKE 1 TABLET BY MOUTH  DAILY 90 tablet 3   No facility-administered medications prior to visit.   Final Medications at End of Visit    Current Meds  Medication Sig   albuterol (PROAIR HFA) 108 (90 Base) MCG/ACT inhaler Inhale 2 puffs into the lungs every 6 (six) hours as needed for wheezing or shortness of breath.   aspirin EC 81 MG tablet Take 1 tablet (81 mg total) by mouth daily. Swallow whole.   budesonide-formoterol (SYMBICORT) 160-4.5 MCG/ACT inhaler Inhale 2 puffs into the lungs in the morning and at bedtime.   Cholecalciferol (VITAMIN D3) 25 MCG (1000 UT) CAPS Take 1 capsule (1,000 Units total) by mouth daily.   diphenhydrAMINE (BENADRYL) 25 MG tablet Take 25 mg by mouth at bedtime as needed for sleep.   enalapril (VASOTEC) 10 MG tablet TAKE 1 AND 1/2 TABLETS BY  MOUTH DAILY   furosemide (LASIX) 20 MG tablet Take 1-2 tablets (20-40 mg total) by mouth daily.   levothyroxine (SYNTHROID) 50 MCG tablet TAKE 1 TABLET BY MOUTH  DAILY EXCEPT 2 TABLETS ON  SUNDAYS AND WEDNESDAYS (  TOTAL 9 TABS PER WEEK)   methocarbamol (ROBAXIN) 500 MG tablet TAKE 1 TABLET BY MOUTH EVERY 12 HOURS AS NEEDED FOR MUSCLE SPASM   metoprolol tartrate (LOPRESSOR) 100 MG tablet TAKE ONE-HALF TABLET BY  MOUTH IN THE MORNING AND 1  TABLET BY MOUTH IN THE  EVENING   rivaroxaban (XARELTO) 2.5 MG TABS tablet Take 1 tablet (2.5 mg total) by mouth 2 (two) times daily.   rosuvastatin (CRESTOR) 20 MG tablet Take 1 tablet (20 mg total) by mouth daily.   [DISCONTINUED] clopidogrel (PLAVIX) 75 MG tablet TAKE 1 TABLET BY MOUTH  DAILY   Radiology:   No results found.  Cardiac Studies:   Carotid Duplex 04/26/2019:  Right Carotid: Velocities in the right ICA are consistent with a 1-39%  stenosis. Non-hemodynamically significant plaque <50% noted in the  CCA. The ECA appears  <50% stenosed.  Left Carotid: Velocities in the left ICA are consistent with a 40-59%  stenosis. Non-hemodynamically significant plaque <50% noted in the  CCA. The ECA appears >50% stenosed.  Vertebrals:  Bilateral vertebral arteries demonstrate antegrade flow.  Subclavians: Normal flow hemodynamics were seen in bilateral subclavian arteries.   Vascular ultrasound aorta/IVC/iliacs 10/06/2020: IVC/Iliac: Suboptimal exam.  - Greater than 50% stenosis in the right CIA  - 50 - 99% stenosis distal to stent and proximal EIA, limited visualization  ABI 10/06/2020: Right: Resting right ankle-brachial index indicates moderate right lower extremity arterial disease.  The right toe-brachial index is abnormal.  Left: Resting left ankle-brachial index indicates moderate left lower extremity arterial disease. The left toe-brachial index is abnormal.  PCV ECHOCARDIOGRAM COMPLETE 11/18/2020 Left ventricle cavity is normal in size. Mild concentric hypertrophy of the left ventricle. Normal global wall motion. Normal LV systolic function with visual EF 50-55%. Doppler evidence of grade I (impaired) diastolic dysfunction, normal LAP. Mild tricuspid regurgitation. No evidence of pulmonary hypertension.   PCV MYOCARDIAL PERFUSION WITH LEXISCAN 11/25/2020 Lexiscan nuclear stress test performed using 1-day protocol. LV cavity is small. Normal myocardial perfusion. Stress LVEF 51%. TID is 1.30. In absence of regional perfusion abnormalities, this is a nonspecific finding. Recommend clinical correlation. Personally reviewed with Dr. Einar Gip who feels stress test is overall low risk.  EKG:   03/29/2021: Sinus rhythm at a rate of 57 bpm.  Normal axis.  Left atrial enlargement.  Poor R wave progression, cannot include anteroseptal infarct old.  No evidence of ischemia or underlying injury pattern.  Compared to EKG 10/20/2020, no significant change.   Assessment     ICD-10-CM   1. PAD (peripheral artery disease) (HCC)   I73.9     2. Bilateral leg edema  R60.0         Medications Discontinued During This Encounter  Medication Reason   clopidogrel (PLAVIX) 75 MG tablet Change in therapy    Meds ordered this encounter  Medications   rivaroxaban (XARELTO) 2.5 MG TABS tablet    Sig: Take 1 tablet (2.5 mg total) by mouth 2 (two) times daily.    Dispense:  60 tablet    Refill:  3     Recommendations:   Jill Franco is a 87 y.o. Caucasian female with history of hypertension, hyperlipidemia, PAD, tobacco use (smokes about half pack per day), patient reported history of CVA (unclear when), COPD, and hypothyroidism.  Denies history of diabetes, MI. Referred to our office for preoperative risk stratification by Dr. Fuller Song (vascular surgery) as patient has planned upcoming left femoral endarterectomy with retrograde iliac stenting.   Patient presents for 2-week follow-up of PAD and leg edema.  Leg edema has essentially resolved, however patient continues to have lateral claudication symptoms which have worsened since her last visit with vascular surgery.  There is no evidence of critical limb ischemia and patient has no rest pain or open wounds.  Patient was seen in collaboration with Dr. Einar Gip and shared decision was to stop Plavix and start patient on low-dose Xarelto in combination with aspirin.  Also recommend that patient have relatively urgent follow-up visit with vascular surgery for further evaluation and recommendations.  In regard to leg edema it is essentially resolved since last office visit.  Renal function remained stable and BNP was mildly elevated.  Patient may reduce Lasix to as needed dosing.  Follow-up in 4 weeks, sooner if needed.  Patient was seen in collaboration with Dr. Einar Gip and he is in agreement with the plan.     Jill Berthold, PA-C 04/16/2021, 11:22 AM Office: [phone]

## 2021-04-12 NOTE — Chronic Care Management (AMB) (Signed)
°  Chronic Care Management   Note  04/12/2021 Name: JAIYAH BEINING MRN: [id number] DOB: [birth year]  MANIYA DONOVAN is a 87 y.o. year old female who is a primary care patient of Tonia Ghent, MD. I reached out to [name] by phone today in response to a referral sent by Ms. Adron Bene Goding's PCP, Tonia Ghent, MD.   Ms. Rabanal was given information about Chronic Care Management services today including:  CCM service includes personalized support from designated clinical staff supervised by her physician, including individualized plan of care and coordination with other care providers 24/7 contact phone numbers for assistance for urgent and routine care needs. Service will only be billed when office clinical staff spend 20 minutes or more in a month to coordinate care. Only one practitioner may furnish and bill the service in a calendar month. The patient may stop CCM services at any time (effective at the end of the month) by phone call to the office staff.   Patient agreed to services and verbal consent obtained.   Follow up plan:   [name]

## 2021-04-19 NOTE — Progress Notes (Signed)
[facility] [place]  ASSESSMENT / PLAN: Jill Franco is a 87 y.o. female with atherosclerosis of native and stented aortoiliac occlusive disease as well as bilateral lower extremity arteries causing causing disabling claudication. Now status post left femoral endarterectomy and profundaplasty, complete endovascular reconstruction of aortic bifurcation (CERAB) 12/03/20.   Recommend the following which can slow the progression of atherosclerosis and reduce the risk of major adverse cardiac / limb events:  Complete cessation from all tobacco products. Blood glucose control with goal A1c < 7%. Blood pressure control with goal blood pressure < 140/90 mmHg. Lipid reduction therapy with goal LDL-C <100 mg/dL (<70 if symptomatic from PAD).  Aspirin 81mg  PO QD.  Plavix 75mg  PO QD x 1 month (OK to stop DAPT now). Atorvastatin 40-80mg  PO QD (or other "high intensity" statin therapy).  She has new rest pain in her left foot. She has no palpable femoral pulses; I suspect her aortoiliac stenting has thrombosed. Will plan CT angiogram of abdomen/pelvis with runoff to plan a revascularization. I will call her with the results.  CHIEF COMPLAINT: Calf pain with walking  HISTORY OF PRESENT ILLNESS: Jill Franco is a 86 y.o. female who returns to clinic for surveillance of peripheral arterial disease.  She reports deteriorating claudication symptoms.  She is now disabled by her symptoms.  She can only walk a short distance before severe pain sets in bilateral calves.  She has no wounds about her feet.  She occasionally has pain in her foot at rest, but does not report symptoms typical of ischemic rest pain.  She continues to smoke, but is working hard to quit.  She is using patches with some success.  10/27/20: patient returns after evaluation by Dr. Einar Gip. Pending stress test and echo. No interval changes.  01/11/21: Patient returns after hybrid revascularization.  She reports  complete symptomatic relief of claudication.  She is walking as far as she likes.  She is appreciative.   04/20/21: Patient returns for follow-up evaluation.  She has had return of severe symptoms in her bilateral lower extremities, left worse than right.  She endorses early rest pain symptoms in her left foot.  She has short distance, disabling claudication in both calves.  VASCULAR SURGICAL HISTORY:  03/18/16 -- left common iliac stent (6 x 40), terminal aortic stent (10 x 19)  ([place])  12/03/20 --  1) US guided right common femoral artery access 2) Aortogram and pelvic angiogram 3) Terminal aortic stenting (11x63mm VBX post dilated to ~51mm) 4) Right common iliac artery stenting (7x67mm VBX post dilated to 42mm proximally) 5) Left common iliac artery stenting (7x55mm VBX post dilated to 20mm proximally) 6) left external iliac artery stenting (7x77mm viabahn) 7) left femoral endarterectomy, profundaplasty, and patch angioplasty (bovine pericardium)  VASCULAR RISK FACTORS: Positive history of stroke / transient ischemic attack. Negative history of coronary artery disease.  Negative history of diabetes mellitus. Last A1c 5.7. Positive history of smoking. Not actively smoking. Positive history of hypertension.  No reported history of chronic kidney disease but last GFR 58-73. CKD stage 2 - 3a. Negative history of chronic obstructive pulmonary disease, but patient has been treated with inhalers with symptomatic relief.   FUNCTIONAL STATUS: ECOG performance status: (1) Restricted in physically strenuous activity, ambulatory and able to do work of light nature Ambulatory status: Ambulatory within the community with limits  Past Medical History:  Diagnosis Date   Cataract    bilaterally corrected.   COPD (chronic obstructive pulmonary disease) (  [place])    CVA (cerebral vascular accident) ([place])    Pt was unaware and does not know when it happened.  Was told after some testing was done.    Double vision    Dyspnea    Facial droop 10/2017   left   History of shingles 2012   HLD (hyperlipidemia)    HTN (hypertension)    Hypothyroidism    PVD (peripheral vascular disease) ([place])    Vision loss    temporary    Past Surgical History:  Procedure Laterality Date   ABDOMINAL EXPLORATION SURGERY  1950s   "they thought I was pregnant; went in to explore"   APPENDECTOMY  childhood   BACK SURGERY     BREAST LUMPECTOMY Left 1960's   benign   carotid ultrasound  06/19/2007   0-39% stenosis   CATARACT EXTRACTION W/ INTRAOCULAR LENS IMPLANT Left 09/2016   CATARACT EXTRACTION W/ INTRAOCULAR LENS IMPLANT Right 10/2016   COLONOSCOPY WITH PROPOFOL N/A 04/08/2019   Procedure: COLONOSCOPY WITH PROPOFOL;  Surgeon: Milus Banister, MD;  Location: [facility];  Service: Endoscopy;  Laterality: N/A;   DILATION AND CURETTAGE OF UTERUS  1960's X 3   miscarriages   DOPPLER ECHOCARDIOGRAPHY  06/19/2007   Normal EF 55-70%   ENDARTERECTOMY FEMORAL Left 12/03/2020   Procedure: LEFT FEMORAL ENDARTERECTOMY AND PROFUNDAPLASTY;  Surgeon: [name], MD;  Location: [facility];  Service: Vascular;  Laterality: Left;   ILIAC ARTERY STENT Left 03/18/2016   common iliac stent (6 x 40), aortic stent (10 x 19)/notes 03/18/2016   INSERTION OF ILIAC STENT Left 12/03/2020   Procedure: AORTIC STENTING, INSERTION OF BILATERAL COMMON ILIAC STENTS, AND LEFT EXTERNAL ILIAC STENT;  Surgeon: [name], MD;  Location: [facility];  Service: Vascular;  Laterality: Left;   LAMINECTOMY AND MICRODISCECTOMY SPINE  1960's X 2   LOWER EXTREMITY ANGIOGRAM N/A 12/03/2020   Procedure: Andris Baumann;  Surgeon: [name], MD;  Location: [place] [place];  Service: Vascular;  Laterality: N/A;   MOLE REMOVAL  07/10/2016   NSVD     x1   PATCH ANGIOPLASTY Left 12/03/2020   Procedure: PATCH ANGIOPLASTY OF LEFT COMMON FEMORAL ARTERY USING BOVINE PERICARDIUM PATCH;  Surgeon: [name], MD;  Location: [facility];  Service:  Vascular;  Laterality: Left;   PERIPHERAL VASCULAR CATHETERIZATION N/A 03/18/2016   Procedure: Abdominal Aortogram w/Lower Extremity;  Surgeon: Elam Dutch, MD;  Location: [facility];  Service: Cardiovascular;  Laterality: N/A;   PERIPHERAL VASCULAR CATHETERIZATION  03/18/2016   Procedure: Peripheral Vascular Intervention;  Surgeon: Elam Dutch, MD;  Location: [facility];  Service: Cardiovascular;;   POLYPECTOMY  04/08/2019   Procedure: POLYPECTOMY;  Surgeon: Milus Banister, MD;  Location: [facility];  Service: Endoscopy;;   ULTRASOUND GUIDANCE FOR VASCULAR ACCESS Right 12/03/2020   Procedure: ULTRASOUND GUIDANCE FOR VASCULAR ACCESS, RIGHT FEMORAL ARTERY;  Surgeon: [name], MD;  Location: [facility];  Service: Vascular;  Laterality: Right;    Family History  Problem Relation Age of Onset   Stroke Father 88   Cancer Brother    Colon cancer Neg Hx    Breast cancer Neg Hx    Colon polyps Neg Hx    Esophageal cancer Neg Hx    Rectal cancer Neg Hx    Stomach cancer Neg Hx     Social History   Socioeconomic History   Marital status: Widowed    Spouse name: Not on file  Number of children: 1   Years of education: Not on file   Highest education level: Not on file  Occupational History   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     Her daughter is helping at home some (some as of  2022).  Her daughter has vision loss and is widowed as of 2022 and moved in with patient.   Social Determinants of Health   Financial Resource Strain: Not on file  Food Insecurity: Not on file  Transportation Needs: Not on file  Physical Activity: Not on file  Stress: Not on file  Social Connections: Not on file  Intimate Partner Violence: Not on file    Allergies  Allergen Reactions   Aspirin Other (See Comments)    REACTION: Uncoated Stomach upset on empty stomach   Codeine Nausea Only and Other (See Comments)    Pill needs to be E.coated   Ibuprofen Nausea And Vomiting   Lipitor [Atorvastatin] Other (See Comments)    myalgia   Other Itching and Rash    Nicotine patches     Current Outpatient Medications  Medication Sig Dispense Refill   albuterol (PROAIR HFA) 108 (90 Base) MCG/ACT inhaler Inhale 2 puffs into the lungs every 6 (six) hours as needed for wheezing or shortness of breath. 18 g 5   aspirin EC 81 MG tablet Take 1 tablet (81 mg total) by mouth daily. Swallow whole. 90 tablet 3   budesonide-formoterol (SYMBICORT) 160-4.5 MCG/ACT inhaler Inhale 2 puffs into the lungs in the morning and at bedtime. 30.6 g 3   Cholecalciferol (VITAMIN D3) 25 MCG (1000 UT) CAPS Take 1 capsule (1,000 Units total) by mouth daily.     diphenhydrAMINE (BENADRYL) 25 MG tablet Take 25 mg by mouth at bedtime as needed for sleep.     enalapril (VASOTEC) 10 MG tablet TAKE 1 AND 1/2 TABLETS BY  MOUTH DAILY 135 tablet 3   furosemide (LASIX) 20 MG tablet Take 1-2 tablets (20-40 mg total) by mouth daily.     levothyroxine (SYNTHROID) 50 MCG tablet TAKE 1 TABLET BY MOUTH  DAILY EXCEPT 2 TABLETS ON  SUNDAYS AND WEDNESDAYS (  TOTAL 9 TABS PER WEEK) 117 tablet 3   methocarbamol (ROBAXIN) 500 MG tablet TAKE 1 TABLET BY MOUTH EVERY 12 HOURS AS NEEDED FOR MUSCLE SPASM 180 tablet 3   metoprolol tartrate (LOPRESSOR) 100 MG tablet TAKE ONE-HALF TABLET BY  MOUTH IN THE MORNING AND 1  TABLET BY MOUTH IN THE   EVENING 135 tablet 3   rivaroxaban (XARELTO) 2.5 MG TABS tablet Take 1 tablet (2.5 mg total) by mouth 2 (two) times daily. 60 tablet 3   rosuvastatin (CRESTOR) 20 MG tablet Take 1 tablet (20 mg total) by mouth daily.  90 tablet 3   No current facility-administered medications for this visit.    REVIEW OF SYSTEMS:  [X]  denotes positive finding, [ ]  denotes negative finding Cardiac  Comments:  Chest pain or chest pressure:    Shortness of breath upon exertion:    Short of breath when lying flat:    Irregular heart rhythm:        Vascular    Pain in calf, thigh, or hip brought on by ambulation:    Pain in feet at night that wakes you up from your sleep:     Blood clot in your veins:    Leg swelling:         Pulmonary    Oxygen at home:    Productive cough:     Wheezing:         Neurologic    Sudden weakness in arms or legs:     Sudden numbness in arms or legs:     Sudden onset of difficulty speaking or slurred speech:    Temporary loss of vision in one eye:     Problems with dizziness:         Gastrointestinal    Blood in stool:     Vomited blood:         Genitourinary    Burning when urinating:     Blood in urine:        Psychiatric    Major depression:         Hematologic    Bleeding problems:    Problems with blood clotting too easily:        Skin    Rashes or ulcers:        Constitutional    Fever or chills:      PHYSICAL EXAM There were no vitals filed for this visit.     Constitutional: well appearing. no distress. Thin. Appears marginally nourished.  Neurologic: CN intact. no focal findings. no sensory loss. Psychiatric:  Mood and affect symmetric and appropriate. Eyes:  No icterus. No conjunctival pallor. Ears, nose, throat:  mucous membranes moist. Midline trachea.  Cardiac: regular rate and rhythm.  Respiratory:  unlabored. Abdominal:  soft, non-tender, non-distended.  Peripheral vascular: Femoral pulses absent.  Left groin incision healed  well. Extremity: no edema. no cyanosis. no pallor.  Skin: no gangrene. no ulceration. Atrophic skin. Lymphatic: no Stemmer's sign. no palpable lymphadenopathy.  PERTINENT LABORATORY AND RADIOLOGIC DATA  Most recent CBC CBC Latest Ref Rng & Units 02/06/2021 12/04/2020 12/03/2020  WBC 4.0 - 10.5 K/uL 7.6 12.0(H) 11.0(H)  Hemoglobin 12.0 - 15.0 g/dL 15.2(H) 12.1 13.6  Hematocrit 36.0 - 46.0 % 45.3 35.0(L) 40.4  Platelets 150 - 400 K/uL 225 149(L) 162     Most recent CMP CMP Latest Ref Rng & Units 04/06/2021 03/25/2021 02/09/2021  Glucose 70 - 99 mg/dL 88 123(H) 130(H)  BUN 8 - 27 mg/dL 23 22 31(H)  Creatinine 0.57 - 1.00 mg/dL 0.92 0.82 1.05  Sodium 134 - 144 mmol/L 143 137 135  Potassium 3.5 - 5.2 mmol/L 4.8 3.7 3.5  Chloride 96 - 106 mmol/L 102 99 94(L)  CO2 20 - 29 mmol/L 24 33(H) 31  Calcium 8.7 - 10.3 mg/dL 9.5 9.3 10.0  Total Protein 6.5 - 8.1 g/dL - - -  Total Bilirubin 0.3 - 1.2 mg/dL - - -  Alkaline Phos 38 - 126 U/L - - -  AST 15 - 41 U/L - - -  ALT 0 - 44 U/L - - -  Renal function Estimated Creatinine Clearance: 34.8 mL/min (by C-G formula based on SCr of 0.92 mg/dL).  Hgb A1c MFr Bld (%)  Date Value  09/18/2020 5.7    LDL Cholesterol  Date Value Ref Range Status  12/04/2020 36 0 - 99 mg/dL Final    Comment:           Total Cholesterol/HDL:CHD Risk Coronary Heart Disease Risk Table                     Men   Women  1/2 Average Risk   3.4   3.3  Average Risk       5.0   4.4  2 X Average Risk   9.6   7.1  3 X Average Risk  23.4   11.0        Use the calculated Patient Ratio above and the CHD Risk Table to determine the patient's CHD Risk.        ATP III CLASSIFICATION (LDL):  <100     mg/dL   Optimal  100-129  mg/dL   Near or Above                    Optimal  130-159  mg/dL   Borderline  160-189  mg/dL   High  >190     mg/dL   Very High Performed at [facility] [address] [place], [place] [address]    Direct LDL  Date Value Ref  Range Status  06/19/2012 115.8 mg/dL Final    Comment:    Optimal:  <100 mg/dLNear or Above Optimal:  100-129 mg/dLBorderline High:  130-159 mg/dLHigh:  160-189 mg/dLVery High:  >190 mg/dL       Yevonne Aline. Stanford Breed, MD [facility] Phone Number: [phone] 04/19/2021 5:15 PM

## 2021-04-19 NOTE — H&P (View-Only) (Signed)
[facility] [place]  ASSESSMENT / PLAN: Jill Franco is a 87 y.o. female with atherosclerosis of native and stented aortoiliac occlusive disease as well as bilateral lower extremity arteries causing causing disabling claudication. Now status post left femoral endarterectomy and profundaplasty, complete endovascular reconstruction of aortic bifurcation (CERAB) 12/03/20.   Recommend the following which can slow the progression of atherosclerosis and reduce the risk of major adverse cardiac / limb events:  Complete cessation from all tobacco products. Blood glucose control with goal A1c < 7%. Blood pressure control with goal blood pressure < 140/90 mmHg. Lipid reduction therapy with goal LDL-C <100 mg/dL (<70 if symptomatic from PAD).  Aspirin 81mg  PO QD.  Plavix 75mg  PO QD x 1 month (OK to stop DAPT now). Atorvastatin 40-80mg  PO QD (or other "high intensity" statin therapy).  She has new rest pain in her left foot. She has no palpable femoral pulses; I suspect her aortoiliac stenting has thrombosed. Will plan CT angiogram of abdomen/pelvis with runoff to plan a revascularization. I will call her with the results.  CHIEF COMPLAINT: Calf pain with walking  HISTORY OF PRESENT ILLNESS: Jill Franco is a 87 y.o. female who returns to clinic for surveillance of peripheral arterial disease.  She reports deteriorating claudication symptoms.  She is now disabled by her symptoms.  She can only walk a short distance before severe pain sets in bilateral calves.  She has no wounds about her feet.  She occasionally has pain in her foot at rest, but does not report symptoms typical of ischemic rest pain.  She continues to smoke, but is working hard to quit.  She is using patches with some success.  10/27/20: patient returns after evaluation by Dr. Einar Gip. Pending stress test and echo. No interval changes.  01/11/21: Patient returns after hybrid revascularization.  She reports  complete symptomatic relief of claudication.  She is walking as far as she likes.  She is appreciative.   04/20/21: Patient returns for follow-up evaluation.  She has had return of severe symptoms in her bilateral lower extremities, left worse than right.  She endorses early rest pain symptoms in her left foot.  She has short distance, disabling claudication in both calves.  VASCULAR SURGICAL HISTORY:  03/18/16 -- left common iliac stent (6 x 40), terminal aortic stent (10 x 19)  ([place])  12/03/20 --  1) US guided right common femoral artery access 2) Aortogram and pelvic angiogram 3) Terminal aortic stenting (11x1mm VBX post dilated to ~8mm) 4) Right common iliac artery stenting (7x56mm VBX post dilated to 37mm proximally) 5) Left common iliac artery stenting (7x75mm VBX post dilated to 75mm proximally) 6) left external iliac artery stenting (7x59mm viabahn) 7) left femoral endarterectomy, profundaplasty, and patch angioplasty (bovine pericardium)  VASCULAR RISK FACTORS: Positive history of stroke / transient ischemic attack. Negative history of coronary artery disease.  Negative history of diabetes mellitus. Last A1c 5.7. Positive history of smoking. Not actively smoking. Positive history of hypertension.  No reported history of chronic kidney disease but last GFR 58-73. CKD stage 2 - 3a. Negative history of chronic obstructive pulmonary disease, but patient has been treated with inhalers with symptomatic relief.   FUNCTIONAL STATUS: ECOG performance status: (1) Restricted in physically strenuous activity, ambulatory and able to do work of light nature Ambulatory status: Ambulatory within the community with limits  Past Medical History:  Diagnosis Date   Cataract    bilaterally corrected.   COPD (chronic obstructive pulmonary disease) (  [place])    CVA (cerebral vascular accident) ([place])    Pt was unaware and does not know when it happened.  Was told after some testing was done.    Double vision    Dyspnea    Facial droop 10/2017   left   History of shingles 2012   HLD (hyperlipidemia)    HTN (hypertension)    Hypothyroidism    PVD (peripheral vascular disease) ([place])    Vision loss    temporary    Past Surgical History:  Procedure Laterality Date   ABDOMINAL EXPLORATION SURGERY  1950s   "they thought I was pregnant; went in to explore"   APPENDECTOMY  childhood   BACK SURGERY     BREAST LUMPECTOMY Left 1960's   benign   carotid ultrasound  06/19/2007   0-39% stenosis   CATARACT EXTRACTION W/ INTRAOCULAR LENS IMPLANT Left 09/2016   CATARACT EXTRACTION W/ INTRAOCULAR LENS IMPLANT Right 10/2016   COLONOSCOPY WITH PROPOFOL N/A 04/08/2019   Procedure: COLONOSCOPY WITH PROPOFOL;  Surgeon: Milus Banister, MD;  Location: [facility];  Service: Endoscopy;  Laterality: N/A;   DILATION AND CURETTAGE OF UTERUS  1960's X 3   miscarriages   DOPPLER ECHOCARDIOGRAPHY  06/19/2007   Normal EF 55-70%   ENDARTERECTOMY FEMORAL Left 12/03/2020   Procedure: LEFT FEMORAL ENDARTERECTOMY AND PROFUNDAPLASTY;  Surgeon: [name], MD;  Location: [facility];  Service: Vascular;  Laterality: Left;   ILIAC ARTERY STENT Left 03/18/2016   common iliac stent (6 x 40), aortic stent (10 x 19)/notes 03/18/2016   INSERTION OF ILIAC STENT Left 12/03/2020   Procedure: AORTIC STENTING, INSERTION OF BILATERAL COMMON ILIAC STENTS, AND LEFT EXTERNAL ILIAC STENT;  Surgeon: [name], MD;  Location: [facility];  Service: Vascular;  Laterality: Left;   LAMINECTOMY AND MICRODISCECTOMY SPINE  1960's X 2   LOWER EXTREMITY ANGIOGRAM N/A 12/03/2020   Procedure: Andris Baumann;  Surgeon: [name], MD;  Location: [facility];  Service: Vascular;  Laterality: N/A;   MOLE REMOVAL  07/10/2016   NSVD     x1   PATCH ANGIOPLASTY Left 12/03/2020   Procedure: PATCH ANGIOPLASTY OF LEFT COMMON FEMORAL ARTERY USING BOVINE PERICARDIUM PATCH;  Surgeon: [name], MD;  Location: [facility];  Service:  Vascular;  Laterality: Left;   PERIPHERAL VASCULAR CATHETERIZATION N/A 03/18/2016   Procedure: Abdominal Aortogram w/Lower Extremity;  Surgeon: Elam Dutch, MD;  Location: [facility];  Service: Cardiovascular;  Laterality: N/A;   PERIPHERAL VASCULAR CATHETERIZATION  03/18/2016   Procedure: Peripheral Vascular Intervention;  Surgeon: Elam Dutch, MD;  Location: [facility];  Service: Cardiovascular;;   POLYPECTOMY  04/08/2019   Procedure: POLYPECTOMY;  Surgeon: Milus Banister, MD;  Location: [facility];  Service: Endoscopy;;   ULTRASOUND GUIDANCE FOR VASCULAR ACCESS Right 12/03/2020   Procedure: ULTRASOUND GUIDANCE FOR VASCULAR ACCESS, RIGHT FEMORAL ARTERY;  Surgeon: [name], MD;  Location: [facility];  Service: Vascular;  Laterality: Right;    Family History  Problem Relation Age of Onset   Stroke Father 96   Cancer Brother    Colon cancer Neg Hx    Breast cancer Neg Hx    Colon polyps Neg Hx    Esophageal cancer Neg Hx    Rectal cancer Neg Hx    Stomach cancer Neg Hx     Social History   Socioeconomic History   Marital status: Widowed    Spouse name: Not on file  Number of children: 1   Years of education: Not on file   Highest education level: Not on file  Occupational History   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     Her daughter is helping at home some (some as of  2022).  Her daughter has vision loss and is widowed as of 2022 and moved in with patient.   Social Determinants of Health   Financial Resource Strain: Not on file  Food Insecurity: Not on file  Transportation Needs: Not on file  Physical Activity: Not on file  Stress: Not on file  Social Connections: Not on file  Intimate Partner Violence: Not on file    Allergies  Allergen Reactions   Aspirin Other (See Comments)    REACTION: Uncoated Stomach upset on empty stomach   Codeine Nausea Only and Other (See Comments)    Pill needs to be E.coated   Ibuprofen Nausea And Vomiting   Lipitor [Atorvastatin] Other (See Comments)    myalgia   Other Itching and Rash    Nicotine patches     Current Outpatient Medications  Medication Sig Dispense Refill   albuterol (PROAIR HFA) 108 (90 Base) MCG/ACT inhaler Inhale 2 puffs into the lungs every 6 (six) hours as needed for wheezing or shortness of breath. 18 g 5   aspirin EC 81 MG tablet Take 1 tablet (81 mg total) by mouth daily. Swallow whole. 90 tablet 3   budesonide-formoterol (SYMBICORT) 160-4.5 MCG/ACT inhaler Inhale 2 puffs into the lungs in the morning and at bedtime. 30.6 g 3   Cholecalciferol (VITAMIN D3) 25 MCG (1000 UT) CAPS Take 1 capsule (1,000 Units total) by mouth daily.     diphenhydrAMINE (BENADRYL) 25 MG tablet Take 25 mg by mouth at bedtime as needed for sleep.     enalapril (VASOTEC) 10 MG tablet TAKE 1 AND 1/2 TABLETS BY  MOUTH DAILY 135 tablet 3   furosemide (LASIX) 20 MG tablet Take 1-2 tablets (20-40 mg total) by mouth daily.     levothyroxine (SYNTHROID) 50 MCG tablet TAKE 1 TABLET BY MOUTH  DAILY EXCEPT 2 TABLETS ON  SUNDAYS AND WEDNESDAYS (  TOTAL 9 TABS PER WEEK) 117 tablet 3   methocarbamol (ROBAXIN) 500 MG tablet TAKE 1 TABLET BY MOUTH EVERY 12 HOURS AS NEEDED FOR MUSCLE SPASM 180 tablet 3   metoprolol tartrate (LOPRESSOR) 100 MG tablet TAKE ONE-HALF TABLET BY  MOUTH IN THE MORNING AND 1  TABLET BY MOUTH IN THE   EVENING 135 tablet 3   rivaroxaban (XARELTO) 2.5 MG TABS tablet Take 1 tablet (2.5 mg total) by mouth 2 (two) times daily. 60 tablet 3   rosuvastatin (CRESTOR) 20 MG tablet Take 1 tablet (20 mg total) by mouth daily.  90 tablet 3   No current facility-administered medications for this visit.    REVIEW OF SYSTEMS:  [X]  denotes positive finding, [ ]  denotes negative finding Cardiac  Comments:  Chest pain or chest pressure:    Shortness of breath upon exertion:    Short of breath when lying flat:    Irregular heart rhythm:        Vascular    Pain in calf, thigh, or hip brought on by ambulation:    Pain in feet at night that wakes you up from your sleep:     Blood clot in your veins:    Leg swelling:         Pulmonary    Oxygen at home:    Productive cough:     Wheezing:         Neurologic    Sudden weakness in arms or legs:     Sudden numbness in arms or legs:     Sudden onset of difficulty speaking or slurred speech:    Temporary loss of vision in one eye:     Problems with dizziness:         Gastrointestinal    Blood in stool:     Vomited blood:         Genitourinary    Burning when urinating:     Blood in urine:        Psychiatric    Major depression:         Hematologic    Bleeding problems:    Problems with blood clotting too easily:        Skin    Rashes or ulcers:        Constitutional    Fever or chills:      PHYSICAL EXAM There were no vitals filed for this visit.     Constitutional: well appearing. no distress. Thin. Appears marginally nourished.  Neurologic: CN intact. no focal findings. no sensory loss. Psychiatric:  Mood and affect symmetric and appropriate. Eyes:  No icterus. No conjunctival pallor. Ears, nose, throat:  mucous membranes moist. Midline trachea.  Cardiac: regular rate and rhythm.  Respiratory:  unlabored. Abdominal:  soft, non-tender, non-distended.  Peripheral vascular: Femoral pulses absent.  Left groin incision healed  well. Extremity: no edema. no cyanosis. no pallor.  Skin: no gangrene. no ulceration. Atrophic skin. Lymphatic: no Stemmer's sign. no palpable lymphadenopathy.  PERTINENT LABORATORY AND RADIOLOGIC DATA  Most recent CBC CBC Latest Ref Rng & Units 02/06/2021 12/04/2020 12/03/2020  WBC 4.0 - 10.5 K/uL 7.6 12.0(H) 11.0(H)  Hemoglobin 12.0 - 15.0 g/dL 15.2(H) 12.1 13.6  Hematocrit 36.0 - 46.0 % 45.3 35.0(L) 40.4  Platelets 150 - 400 K/uL 225 149(L) 162     Most recent CMP CMP Latest Ref Rng & Units 04/06/2021 03/25/2021 02/09/2021  Glucose 70 - 99 mg/dL 88 123(H) 130(H)  BUN 8 - 27 mg/dL 23 22 31(H)  Creatinine 0.57 - 1.00 mg/dL 0.92 0.82 1.05  Sodium 134 - 144 mmol/L 143 137 135  Potassium 3.5 - 5.2 mmol/L 4.8 3.7 3.5  Chloride 96 - 106 mmol/L 102 99 94(L)  CO2 20 - 29 mmol/L 24 33(H) 31  Calcium 8.7 - 10.3 mg/dL 9.5 9.3 10.0  Total Protein 6.5 - 8.1 g/dL - - -  Total Bilirubin 0.3 - 1.2 mg/dL - - -  Alkaline Phos 38 - 126 U/L - - -  AST 15 - 41 U/L - - -  ALT 0 - 44 U/L - - -  Renal function Estimated Creatinine Clearance: 34.8 mL/min (by C-G formula based on SCr of 0.92 mg/dL).  Hgb A1c MFr Bld (%)  Date Value  09/18/2020 5.7    LDL Cholesterol  Date Value Ref Range Status  12/04/2020 36 0 - 99 mg/dL Final    Comment:           Total Cholesterol/HDL:CHD Risk Coronary Heart Disease Risk Table                     Men   Women  1/2 Average Risk   3.4   3.3  Average Risk       5.0   4.4  2 X Average Risk   9.6   7.1  3 X Average Risk  23.4   11.0        Use the calculated Patient Ratio above and the CHD Risk Table to determine the patient's CHD Risk.        ATP III CLASSIFICATION (LDL):  <100     mg/dL   Optimal  100-129  mg/dL   Near or Above                    Optimal  130-159  mg/dL   Borderline  160-189  mg/dL   High  >190     mg/dL   Very High Performed at [facility] [address] [place], [place] [address]    Direct LDL  Date Value Ref  Range Status  06/19/2012 115.8 mg/dL Final    Comment:    Optimal:  <100 mg/dLNear or Above Optimal:  100-129 mg/dLBorderline High:  130-159 mg/dLHigh:  160-189 mg/dLVery High:  >190 mg/dL       Yevonne Aline. Stanford Breed, MD [facility] Phone Number: [phone] 04/19/2021 5:15 PM

## 2021-04-20 ENCOUNTER — Encounter: Payer: Self-pay | Admitting: Vascular Surgery

## 2021-04-20 ENCOUNTER — Ambulatory Visit: Payer: Medicare Other | Admitting: Vascular Surgery

## 2021-04-20 ENCOUNTER — Other Ambulatory Visit: Payer: Self-pay

## 2021-04-20 VITALS — BP 115/58 | HR 60 | Temp 97.9°F | Resp 20 | Ht 64.0 in | Wt 106.2 lb

## 2021-04-20 DIAGNOSIS — I739 Peripheral vascular disease, unspecified: Secondary | ICD-10-CM

## 2021-04-22 ENCOUNTER — Other Ambulatory Visit: Payer: Self-pay

## 2021-04-22 DIAGNOSIS — I739 Peripheral vascular disease, unspecified: Secondary | ICD-10-CM

## 2021-04-23 ENCOUNTER — Ambulatory Visit
Admission: RE | Admit: 2021-04-23 | Discharge: 2021-04-23 | Disposition: A | Discharge status: Home / Self Care | Payer: Medicare Other | Source: Ambulatory Visit | Attending: Vascular Surgery | Admitting: Vascular Surgery

## 2021-04-23 DIAGNOSIS — I743 Embolism and thrombosis of arteries of the lower extremities: Secondary | ICD-10-CM | POA: Diagnosis not present

## 2021-04-23 DIAGNOSIS — I745 Embolism and thrombosis of iliac artery: Secondary | ICD-10-CM | POA: Diagnosis not present

## 2021-04-23 DIAGNOSIS — M7989 Other specified soft tissue disorders: Secondary | ICD-10-CM | POA: Diagnosis not present

## 2021-04-23 DIAGNOSIS — I739 Peripheral vascular disease, unspecified: Secondary | ICD-10-CM

## 2021-04-23 DIAGNOSIS — K551 Chronic vascular disorders of intestine: Secondary | ICD-10-CM | POA: Diagnosis not present

## 2021-04-23 MED ORDER — IOPAMIDOL (ISOVUE-370) INJECTION 76%
100.0000 mL | Freq: Once | INTRAVENOUS | Status: AC | PRN
  Administered 2021-04-23: 100 mL via INTRAVENOUS

## 2021-04-26 ENCOUNTER — Telehealth: Payer: Self-pay

## 2021-04-26 DIAGNOSIS — R0600 Dyspnea, unspecified: Secondary | ICD-10-CM

## 2021-04-26 MED ORDER — FUROSEMIDE 20 MG PO TABS: 20.0000 mg | ORAL_TABLET | Freq: Every day | ORAL | 3 refills | Status: DC

## 2021-04-26 NOTE — Telephone Encounter (Signed)
Patient is requesting refill on her Lasix. Is currently out. Please send to [facility]. Please advise

## 2021-04-26 NOTE — Telephone Encounter (Signed)
ERX sent.  

## 2021-04-27 ENCOUNTER — Other Ambulatory Visit: Payer: Self-pay

## 2021-04-27 ENCOUNTER — Ambulatory Visit (INDEPENDENT_AMBULATORY_CARE_PROVIDER_SITE_OTHER): Payer: Medicare Other | Admitting: Vascular Surgery

## 2021-04-27 DIAGNOSIS — I7409 Other arterial embolism and thrombosis of abdominal aorta: Secondary | ICD-10-CM

## 2021-04-27 DIAGNOSIS — I70222 Atherosclerosis of native arteries of extremities with rest pain, left leg: Secondary | ICD-10-CM

## 2021-04-27 MED ORDER — FUROSEMIDE 20 MG PO TABS: 20.0000 mg | ORAL_TABLET | Freq: Every day | ORAL | 0 refills | Status: DC

## 2021-04-27 NOTE — Progress Notes (Signed)
Jill Franco is a 87 y.o. female with atherosclerosis of native and stented aortoiliac occlusive disease as well as bilateral lower extremities, now status post left femoral endarterectomy and profundaplasty, complete endovascular reconstruction of aortic bifurcation (CERAB) 12/03/20.  She has recently developed ischemic rest pain of the left lower extremity.  CT angiogram was performed and shows severe stenosis of the left common iliac artery stent along with new, critical stenosis of the left external iliac artery.  Catheterization is indicated for chronic limb threatening ischemia. ? ?Recommend the following which can slow the progression of atherosclerosis and reduce the risk of major adverse cardiac / limb events:  ?Complete cessation from all tobacco products. ?Blood glucose control with goal A1c < 7%. ?Blood pressure control with goal blood pressure < 140/90 mmHg. ?Lipid reduction therapy with goal LDL-C <100 mg/dL (<70 if symptomatic from PAD).  ?Aspirin '81mg'$  PO QD.  ?Plavix '75mg'$  PO QD x 1 month (OK to stop DAPT now). ?Atorvastatin 40-'80mg'$  PO QD (or other "high intensity" statin therapy). ? ?Plan abdominal aortic angiogram with intervention the left iliac artery stenosis via bilateral common femoral artery access as soon as schedule permits. ? ?Jill Franco. Stanford Breed, MD ?[facility] [place] ?Office Phone Number: [phone] ?04/27/2021 3:56 PM ? ? ?

## 2021-04-27 NOTE — Addendum Note (Signed)
Addended by: Pilar Grammes on: 04/27/2021 01:28 PM ? ? Modules accepted: Orders ? ?

## 2021-04-27 NOTE — Telephone Encounter (Signed)
Pt needed a small rx to go to [facility] while they wait on the mail order to come ?

## 2021-04-30 ENCOUNTER — Ambulatory Visit (HOSPITAL_COMMUNITY)
Admission: RE | Admit: 2021-04-30 | Discharge: 2021-04-30 | Disposition: A | Discharge status: Home / Self Care | Payer: Medicare Other | Attending: Vascular Surgery | Admitting: Vascular Surgery

## 2021-04-30 ENCOUNTER — Other Ambulatory Visit: Payer: Self-pay

## 2021-04-30 ENCOUNTER — Encounter (HOSPITAL_COMMUNITY)
Admission: RE | Disposition: A | Discharge status: Home / Self Care | Payer: Self-pay | Source: Home / Self Care | Attending: Vascular Surgery

## 2021-04-30 DIAGNOSIS — F17210 Nicotine dependence, cigarettes, uncomplicated: Secondary | ICD-10-CM | POA: Diagnosis not present

## 2021-04-30 DIAGNOSIS — J449 Chronic obstructive pulmonary disease, unspecified: Secondary | ICD-10-CM | POA: Insufficient documentation

## 2021-04-30 DIAGNOSIS — Z7982 Long term (current) use of aspirin: Secondary | ICD-10-CM | POA: Insufficient documentation

## 2021-04-30 DIAGNOSIS — E039 Hypothyroidism, unspecified: Secondary | ICD-10-CM | POA: Insufficient documentation

## 2021-04-30 DIAGNOSIS — T82858A Stenosis of vascular prosthetic devices, implants and grafts, initial encounter: Secondary | ICD-10-CM | POA: Insufficient documentation

## 2021-04-30 DIAGNOSIS — E785 Hyperlipidemia, unspecified: Secondary | ICD-10-CM | POA: Diagnosis not present

## 2021-04-30 DIAGNOSIS — I70222 Atherosclerosis of native arteries of extremities with rest pain, left leg: Secondary | ICD-10-CM | POA: Diagnosis not present

## 2021-04-30 DIAGNOSIS — Z8673 Personal history of transient ischemic attack (TIA), and cerebral infarction without residual deficits: Secondary | ICD-10-CM | POA: Insufficient documentation

## 2021-04-30 DIAGNOSIS — Y832 Surgical operation with anastomosis, bypass or graft as the cause of abnormal reaction of the patient, or of later complication, without mention of misadventure at the time of the procedure: Secondary | ICD-10-CM | POA: Diagnosis not present

## 2021-04-30 DIAGNOSIS — Z7901 Long term (current) use of anticoagulants: Secondary | ICD-10-CM | POA: Insufficient documentation

## 2021-04-30 DIAGNOSIS — Z95820 Peripheral vascular angioplasty status with implants and grafts: Secondary | ICD-10-CM | POA: Diagnosis not present

## 2021-04-30 DIAGNOSIS — Z79899 Other long term (current) drug therapy: Secondary | ICD-10-CM | POA: Insufficient documentation

## 2021-04-30 DIAGNOSIS — I129 Hypertensive chronic kidney disease with stage 1 through stage 4 chronic kidney disease, or unspecified chronic kidney disease: Secondary | ICD-10-CM | POA: Insufficient documentation

## 2021-04-30 DIAGNOSIS — Z79890 Hormone replacement therapy (postmenopausal): Secondary | ICD-10-CM | POA: Diagnosis not present

## 2021-04-30 DIAGNOSIS — N182 Chronic kidney disease, stage 2 (mild): Secondary | ICD-10-CM | POA: Diagnosis not present

## 2021-04-30 HISTORY — PX: PERIPHERAL VASCULAR INTERVENTION: CATH118257

## 2021-04-30 HISTORY — PX: ABDOMINAL AORTOGRAM: CATH118222

## 2021-04-30 HISTORY — PX: PERIPHERAL VASCULAR BALLOON ANGIOPLASTY: CATH118281

## 2021-04-30 LAB — POCT I-STAT, CHEM 8
BUN: 23 mg/dL (ref 8–23)
Calcium, Ion: 1.24 mmol/L (ref 1.15–1.40)
Chloride: 101 mmol/L (ref 98–111)
Creatinine, Ser: 0.8 mg/dL (ref 0.44–1.00)
Glucose, Bld: 83 mg/dL (ref 70–99)
HCT: 40 % (ref 36.0–46.0)
Hemoglobin: 13.6 g/dL (ref 12.0–15.0)
Potassium: 4.3 mmol/L (ref 3.5–5.1)
Sodium: 139 mmol/L (ref 135–145)
TCO2: 29 mmol/L (ref 22–32)

## 2021-04-30 LAB — POCT ACTIVATED CLOTTING TIME
Activated Clotting Time: 209 seconds
Activated Clotting Time: 179 seconds

## 2021-04-30 SURGERY — ABDOMINAL AORTOGRAM
Anesthesia: LOCAL | Laterality: Right

## 2021-04-30 MED ORDER — SODIUM CHLORIDE 0.9 % WEIGHT BASED INFUSION
1.0000 mL/kg/h | INTRAVENOUS | Status: DC
  Administered 2021-04-30: 1 mL/kg/h via INTRAVENOUS

## 2021-04-30 MED ORDER — CLOPIDOGREL BISULFATE 75 MG PO TABS
300.0000 mg | ORAL_TABLET | Freq: Once | ORAL | Status: AC
  Administered 2021-04-30: 300 mg via ORAL

## 2021-04-30 MED ORDER — HYDRALAZINE HCL 20 MG/ML IJ SOLN
INTRAMUSCULAR | Status: AC
  Filled 2021-04-30: qty 1

## 2021-04-30 MED ORDER — HEPARIN SODIUM (PORCINE) 1000 UNIT/ML IJ SOLN
INTRAMUSCULAR | Status: AC
  Filled 2021-04-30: qty 10

## 2021-04-30 MED ORDER — LABETALOL HCL 5 MG/ML IV SOLN: 10.0000 mg | INTRAVENOUS | Status: DC | PRN

## 2021-04-30 MED ORDER — LIDOCAINE HCL (PF) 1 % IJ SOLN
INTRAMUSCULAR | Status: AC
  Filled 2021-04-30: qty 30

## 2021-04-30 MED ORDER — SODIUM CHLORIDE 0.9 % IV SOLN: 250.0000 mL | INTRAVENOUS | Status: DC | PRN

## 2021-04-30 MED ORDER — ALUM & MAG HYDROXIDE-SIMETH 200-200-20 MG/5ML PO SUSP
30.0000 mL | Freq: Once | ORAL | Status: AC
  Administered 2021-04-30: 30 mL via ORAL
  Filled 2021-04-30: qty 30

## 2021-04-30 MED ORDER — ASPIRIN 81 MG PO CHEW
CHEWABLE_TABLET | ORAL | Status: AC
  Filled 2021-04-30: qty 1

## 2021-04-30 MED ORDER — HEPARIN (PORCINE) IN NACL 1000-0.9 UT/500ML-% IV SOLN
INTRAVENOUS | Status: AC
  Filled 2021-04-30: qty 1000

## 2021-04-30 MED ORDER — IODIXANOL 320 MG/ML IV SOLN
INTRAVENOUS | Status: DC | PRN
  Administered 2021-04-30: 40 mL via INTRA_ARTERIAL

## 2021-04-30 MED ORDER — MIDAZOLAM HCL 2 MG/2ML IJ SOLN
INTRAMUSCULAR | Status: AC
Start: 2021-04-30 — End: ?
  Filled 2021-04-30: qty 2

## 2021-04-30 MED ORDER — ACETAMINOPHEN 325 MG PO TABS
650.0000 mg | ORAL_TABLET | ORAL | Status: DC | PRN
  Administered 2021-04-30: 650 mg via ORAL
  Filled 2021-04-30: qty 2

## 2021-04-30 MED ORDER — HEPARIN (PORCINE) IN NACL 1000-0.9 UT/500ML-% IV SOLN
INTRAVENOUS | Status: DC | PRN
  Administered 2021-04-30 (×2): 500 mL

## 2021-04-30 MED ORDER — CLOPIDOGREL BISULFATE 75 MG PO TABS: 75.0000 mg | ORAL_TABLET | Freq: Every day | ORAL | Status: DC

## 2021-04-30 MED ORDER — HYDRALAZINE HCL 20 MG/ML IJ SOLN
5.0000 mg | INTRAMUSCULAR | Status: DC | PRN
  Administered 2021-04-30: 5 mg via INTRAVENOUS

## 2021-04-30 MED ORDER — SODIUM CHLORIDE 0.9% FLUSH: 3.0000 mL | Freq: Two times a day (BID) | INTRAVENOUS | Status: DC

## 2021-04-30 MED ORDER — ASPIRIN EC 81 MG PO TBEC
81.0000 mg | DELAYED_RELEASE_TABLET | Freq: Every day | ORAL | Status: DC
  Administered 2021-04-30: 81 mg via ORAL

## 2021-04-30 MED ORDER — HEPARIN SODIUM (PORCINE) 1000 UNIT/ML IJ SOLN
INTRAMUSCULAR | Status: DC | PRN
  Administered 2021-04-30: 5000 [IU] via INTRAVENOUS

## 2021-04-30 MED ORDER — FENTANYL CITRATE (PF) 100 MCG/2ML IJ SOLN
INTRAMUSCULAR | Status: DC | PRN
  Administered 2021-04-30: 50 ug via INTRAVENOUS

## 2021-04-30 MED ORDER — MIDAZOLAM HCL 2 MG/2ML IJ SOLN
INTRAMUSCULAR | Status: DC | PRN
  Administered 2021-04-30: 1 mg via INTRAVENOUS

## 2021-04-30 MED ORDER — ALUM & MAG HYDROXIDE-SIMETH 200-200-20 MG/5ML PO SUSP
ORAL | Status: AC
  Filled 2021-04-30: qty 30

## 2021-04-30 MED ORDER — ONDANSETRON HCL 4 MG/2ML IJ SOLN
4.0000 mg | Freq: Four times a day (QID) | INTRAMUSCULAR | Status: DC | PRN
  Administered 2021-04-30: 4 mg via INTRAVENOUS
  Filled 2021-04-30: qty 2

## 2021-04-30 MED ORDER — FENTANYL CITRATE (PF) 100 MCG/2ML IJ SOLN
INTRAMUSCULAR | Status: AC
Start: 2021-04-30 — End: ?
  Filled 2021-04-30: qty 2

## 2021-04-30 MED ORDER — SODIUM CHLORIDE 0.9 % IV SOLN
INTRAVENOUS | Status: DC
Start: 2021-04-30 — End: 2021-04-30

## 2021-04-30 MED ORDER — CLOPIDOGREL BISULFATE 300 MG PO TABS
ORAL_TABLET | ORAL | Status: AC
  Filled 2021-04-30: qty 1

## 2021-04-30 MED ORDER — SODIUM CHLORIDE 0.9% FLUSH: 3.0000 mL | INTRAVENOUS | Status: DC | PRN

## 2021-04-30 MED ORDER — LIDOCAINE HCL (PF) 1 % IJ SOLN
INTRAMUSCULAR | Status: DC | PRN
  Administered 2021-04-30 (×2): 5 mL

## 2021-04-30 SURGICAL SUPPLY — 25 items
BALLN MUSTANG 6X60X75 (BALLOONS) ×5
BALLN MUSTANG 7X60X75 (BALLOONS) ×15
BALLOON MUSTANG 6X60X75 (BALLOONS) IMPLANT
BALLOON MUSTANG 7X60X75 (BALLOONS) IMPLANT
CATH OMNI FLUSH 5F 65CM (CATHETERS) ×2 IMPLANT
CATH VISIONS PV .035 IVUS (CATHETERS) ×2 IMPLANT
CLOSURE PERCLOSE PROSTYLE (VASCULAR PRODUCTS) ×2 IMPLANT
DEVICE CONTINUOUS FLUSH (MISCELLANEOUS) ×2 IMPLANT
GLIDEWIRE NITREX 0.018X80X5 (WIRE) ×5
GUIDEWIRE NITREX 0.018X80X5 (WIRE) IMPLANT
KIT ENCORE 26 ADVANTAGE (KITS) ×4 IMPLANT
KIT MICROPUNCTURE NIT STIFF (SHEATH) ×2 IMPLANT
KIT PV (KITS) ×6 IMPLANT
SHEATH PINNACLE 5F 10CM (SHEATH) ×2 IMPLANT
SHEATH PINNACLE 8F 10CM (SHEATH) ×2 IMPLANT
SHEATH PROBE COVER 6X72 (BAG) ×2 IMPLANT
STENT ELUVIA 7X60X130 (Permanent Stent) ×2 IMPLANT
STOPCOCK MORSE 400PSI 3WAY (MISCELLANEOUS) ×2 IMPLANT
SYR MEDRAD MARK 7 150ML (SYRINGE) ×6 IMPLANT
TRANSDUCER W/STOPCOCK (MISCELLANEOUS) ×6 IMPLANT
TRAY PV CATH (CUSTOM PROCEDURE TRAY) ×6 IMPLANT
TUBING CIL FLEX 10 FLL-RA (TUBING) ×2 IMPLANT
WIRE BENTSON .035X145CM (WIRE) ×2 IMPLANT
WIRE HI TORQ VERSACORE J 260CM (WIRE) ×2 IMPLANT
WIRE HITORQ VERSACORE ST 145CM (WIRE) ×2 IMPLANT

## 2021-04-30 NOTE — Interval H&P Note (Signed)
History and Physical Interval Note: ? ?04/30/2021 ?8:39 AM ? ?Jill Franco  has presented today for surgery, with the diagnosis of aortic illac accul disease - artherosclerosis of native artery - rest pain left leg.  The various methods of treatment have been discussed with the patient and family. After consideration of risks, benefits and other options for treatment, the patient has consented to  Procedure(s): ?ABDOMINAL AORTOGRAM W/LOWER EXTREMITY (N/A) as a surgical intervention.  The patient's history has been reviewed, patient examined, no change in status, stable for surgery.  I have reviewed the patient's chart and labs.  Questions were answered to the patient's satisfaction.   ? ? ?[name] ? ? ?

## 2021-04-30 NOTE — Progress Notes (Signed)
SITE AREA: right groin/femoral ? ?SITE PRIOR TO REMOVAL:  LEVEL 0 ? ?PRESSURE APPLIED FOR: approximately 20 minutes ? ?MANUAL: yes ? ?PATIENT STATUS DURING PULL: stable ? ?POST PULL SITE:  LEVEL 0 ? ?POST PULL INSTRUCTIONS GIVEN: yes ? ?POST PULL PULSES PRESENT: right post tibial and left pedal dopplerable ? ?DRESSING APPLIED: gauze with tegaderm ? ?BEDREST BEGINS @ 1159 ? ?COMMENTS: purewick placed during manual removal of sheath by [place], NT ?

## 2021-04-30 NOTE — Op Note (Signed)
DATE OF SERVICE: 04/30/2021 ? ?PATIENT:  Jill Franco  87 y.o. female ? ?PRE-OPERATIVE DIAGNOSIS:  aortoiliac occlusive disease; restenosis of left common iliac stent; left external iliac stenosis. Causing ischemic rest pain on left ? ?POST-OPERATIVE DIAGNOSIS:  Same ? ?PROCEDURE:   ?1) US guided bilateral common femoral artery access ?2) Aortogram ?3) Left pelvic angiogram (51m total contrast) ?4) Angioplasty of bilateral common iliac artery stents (7x658mMustang) ?5) Left external iliac artery stenting (7x6036mluvia) ?6) Intravascular ultrasound of left common and external iliac arteries ?7) Conscious sedation (41 minutes) ? ?SURGEON:  Jill Franco ? ?ASSISTANT: none ? ?ANESTHESIA:   local and IV sedation ? ?ESTIMATED BLOOD LOSS: minimal ? ?LOCAL MEDICATIONS USED:  LIDOCAINE  ? ?COUNTS: confirmed correct. ? ?PATIENT DISPOSITION:  PACU - hemodynamically stable. ?  ?Delay start of Pharmacological VTE agent (>24hrs) due to surgical blood loss or risk of bleeding: no ? ?INDICATION FOR PROCEDURE: Jill Franco a 86 51o. female who recently underwent femoral endarterctomy and endovascular reconstruction of the aortic bifurcation. She initially had symptomatic relief. The patient returned several months later reporting worsening pain in her left leg consistent with ischemic rest pain. A CT angiogram showed impingement of her left common iliac artery stent and new external iliac artery stenosis. After careful discussion of risks, benefits, and alternatives the patient was offered angiogram with possible intervention. The patient understood and wished to proceed. ? ?OPERATIVE FINDINGS:  ?Redemonstration of left common iliac artery stent impingement on intravascular ultrasound. ?Simultaneous angioplasty of the bilateral common iliac artery stents with a 7 x 60 Mustang balloon relieved impingement on follow-up intravascular ultrasound. ?Aortogram and pelvic angiogram shows redemonstration of left external  iliac artery stenosis. ?Left external iliac artery stenosis much improved after stenting with a 7 x 60 mm Eluvia with post dilation with a 6 x 60 mm Mustang.  Palpable pulse was noted in the left groin at the end of the case. ? ?DESCRIPTION OF PROCEDURE: After identification of the patient in the pre-operative holding area, the patient was transferred to the operating room. The patient was positioned supine on the operating room table. Anesthesia was induced. The groins was prepped and draped in standard fashion. A surgical pause was performed confirming correct patient, procedure, and operative location. ? ?The bilateral groins were anesthetized with subcutaneous injection of 1% lidocaine. Using ultrasound guidance, the bilateral common femoral arteries was accessed with micropuncture technique. Fluoroscopy was used to confirm cannulation over the femoral head. The 625F sheath was upsized to 25F.  ? ?Versacore wires were advanced into the terminal aorta bilaterally.  Our access was upsized to 8 [place] the left.  Our access was upsized to 5 [place] the right.  The patient was systemically heparinized with 5000 units of IV heparin. ? ?Over the wires, intravascular ultrasound was performed on the left.  This redemonstrated impingement of the left common iliac artery stent.  Over the wires, a 7 x 60 mm Mustang balloon was delivered across the common iliac artery stents bilaterally.  Simultaneous inflation of the balloon to rated burst pressure was performed.  No change in hemodynamics was noted.  The balloons were then simultaneously deflated.  Repeat intravascular ultrasound revealed improvement in the left common iliac artery profile.  Pulsatile flow was noted in the sideport of the left femoral sheath. ? ?An Omni Flush catheter was advanced over the wire on the right side to the terminal aorta.  A retrograde pelvic angiogram was performed from the  right side access.  This redemonstrated the external iliac artery  stenosis.  I elected to stent this to the acetabulum.  A 7 x 60 mm Eluvia was used to stent this area.  This was postdilated with a 6 x 60 mm Mustang balloon.  Follow-up angiogram done via power injection from the terminal aorta showed resolution of the left common and external iliac stenosis.  Preserved brisk flow through both iliac arteries into the groins.  Satisfied we ended the case here. ? ?A Perclose device was used to close the 8 French arteriotomy on the left.  Manual pressure was used to close the arteriotomy on the right. ? ?Conscious sedation was administered with the use of IV fentanyl and midazolam under continuous physician and nurse monitoring.  Heart rate, blood pressure, and oxygen saturation were continuously monitored.  Total sedation time was 41 minutes ? ?Upon completion of the case instrument and sharps counts were confirmed correct. The patient was transferred to the PACU in good condition. I was present for all portions of the procedure. ? ?PLAN: Aspirin 81 mg by mouth daily.  Plavix 75 mg by mouth daily.  High intensity statin therapy.  Follow-up with me in 1 month with ABI. ? ?Jill Franco. Jill Breed, MD ?[facility] [place] ?Office Phone Number: [phone] ?04/30/2021 9:29 AM ? ?

## 2021-05-03 ENCOUNTER — Encounter (HOSPITAL_COMMUNITY): Payer: Self-pay | Admitting: Vascular Surgery

## 2021-05-04 ENCOUNTER — Encounter (HOSPITAL_COMMUNITY): Payer: Self-pay | Admitting: Vascular Surgery

## 2021-05-06 ENCOUNTER — Telehealth: Payer: Self-pay

## 2021-05-06 NOTE — Chronic Care Management (AMB) (Signed)
? ? ?Chronic Care Management ?Pharmacy Assistant  ? ?Name: Jill Franco  MRN: [id number] DOB: 1934/08/14 ? ?Jill Franco is an 87 y.o. year old female who presents for his initial CCM visit with the clinical pharmacist. ? ?Reason for Encounter: Initial Questions ?  ?Conditions to be addressed/monitored: ?HTN and HLD ? ?Recent office visits:  ?03/25/21-PCP-Graham Duncan,MD-Edema -Increase Lasix -asked her to check her feet each AM.  ?I would hold HCTZ for now. ?[facility]) No medication changes ? ?Recent consult visits:  ?04/30/21-[facility]-Thomas Hawken,MD- patient had abdominal aortogram with stent placement procedure- no admission ?04/27/21-Vascular Surgery- Marcello Moores Hawken,MD-Occlusive disease- schedule stent placement ?04/20/21-Vascular Surgery-Thomas Hawken,MD-follow up occlusive disease-Will plan CT angiogram of abdomen/pelvis with runoff to plan a revascularization.Continue aspirin xarelto ?04/12/21-Cardiology-Ceeste Cantwell,PA-Follow up leg swelling.- stop Plavix and start patient on low-dose Xarelto in combination with aspirin  Patient may reduce Lasix to as needed dosing. ?03/29/21-Cardiology-Celeste Cantwell,PA-Follow up Resume aspirin '81mg'$  and plavix  ?02/06/21- ED- Jeannie Done Pfeiffer,MD-COPD-Patient improved with 1 DuoNeb but still was dyspneic.  Currently getting hour continuous nebulizer treatment.  Suspected COPD if significantly improved consider discharge. 1 IV dose of Lasix given.  Will have patient increase her Lasix dose for 2 days.  Certainly also appears to be more significant component of COPD.  We will continue oral steroids and albuterol therapy ?01/12/21-Vascular Surgery-Thomas Hawken,MD- Post op hybrid revascularization. ?12/01/20-Cardiology-Jay Ganji,MD- Peripheral artery disease-ECG,Stress test ? ? ?Hospital visits:  ?Medication Reconciliation was completed by comparing discharge summary, patient?s EMR and Pharmacy  list, and upon discussion with patient. ? ?Admitted to the hospital on 12/03/20 due to Occlusive disease. Discharge date was 12/04/21. Discharged from [facility].   ? ?New?Medications Started at [facility] Discharge:?? ?-started Oxycodone ? ?Medications that remain the same after Hospital Discharge:??  ?-All other medications will remain the same.   ? ?Medications: ?Outpatient Encounter Medications as of 05/06/2021  ?Medication Sig Note  ? albuterol (PROAIR HFA) 108 (90 Base) MCG/ACT inhaler Inhale 2 puffs into the lungs every 6 (six) hours as needed for wheezing or shortness of breath.   ? aspirin EC 81 MG tablet Take 1 tablet (81 mg total) by mouth daily. Swallow whole.   ? budesonide-formoterol (SYMBICORT) 160-4.5 MCG/ACT inhaler Inhale 2 puffs into the lungs in the morning and at bedtime. (Patient taking differently: Inhale 2 puffs into the lungs 2 (two) times daily as needed (respiratory issues.).)   ? Cholecalciferol (VITAMIN D3) 25 MCG (1000 UT) CAPS Take 1 capsule (1,000 Units total) by mouth daily.   ? diphenhydrAMINE (BENADRYL) 25 MG tablet Take 25 mg by mouth at bedtime as needed for sleep. 04/28/2021: Seldom   ? enalapril (VASOTEC) 10 MG tablet TAKE 1 AND 1/2 TABLETS BY  MOUTH DAILY (Patient taking differently: Take 5-10 mg by mouth See admin instructions. Take 0.5 tablet (5 mg) by mouth in the morning & take 1 tablet (10 mg) by mouth at night.)   ? furosemide (LASIX) 20 MG tablet Take 1-2 tablets (20-40 mg total) by mouth daily. 04/28/2021: Patient has not picked up from pharmacy  ? hydrochlorothiazide (MICROZIDE) 12.5 MG capsule Take 12.5 mg by mouth every evening.   ? levothyroxine (SYNTHROID) 50 MCG tablet TAKE 1 TABLET BY MOUTH  DAILY EXCEPT 2 TABLETS ON  SUNDAYS AND WEDNESDAYS (  TOTAL 9 TABS PER WEEK)   ? methocarbamol (ROBAXIN) 500 MG tablet TAKE 1 TABLET BY MOUTH EVERY 12 HOURS AS NEEDED FOR MUSCLE SPASM   ? metoprolol  tartrate (LOPRESSOR) 100 MG tablet TAKE ONE-HALF TABLET BY  MOUTH IN THE  MORNING AND 1  TABLET BY MOUTH IN THE  EVENING   ? rivaroxaban (XARELTO) 2.5 MG TABS tablet Take 1 tablet (2.5 mg total) by mouth 2 (two) times daily. 04/28/2021: On hold due to upcoming procedure.  ? rosuvastatin (CRESTOR) 20 MG tablet Take 1 tablet (20 mg total) by mouth daily.   ? ?No facility-administered encounter medications on file as of 05/06/2021.  ? ? ? ?Lab Results  ?Component Value Date/Time  ? HGBA1C 5.7 09/18/2020 09:18 AM  ?  ? ?BP Readings from Last 3 Encounters:  ?04/30/21 (!) 121/54  ?04/20/21 (!) 115/58  ?04/12/21 117/68  ? ? ? ?Patient contacted to confirm in office appointment with Charlene Brooke, Pharm D, on 05/11/21 at 1:30pm. ? ?Do you have any problems getting your medications? No- Patient reports getting prescriptions mail order with no complaints ? ?What is your top health concern you would like to discuss at your upcoming visit? Timing how she takes her medications ? ?Have you seen any other providers since your last visit with PCP? Yes  Cardiology, Vascular Surgery  ? ? ? ?Star Rating Drugs:  ?Medication:  Last Fill: Day Supply ?Enalapril '10mg'$  02/19/21 90 ?Rosuvastatin '20mg'$  04/14/21 90 ?Pravastatin '10mg'$  02/19/21 90 ? ?Care Gaps: ?Annual wellness visit in last year? No ?Most Recent BP reading:115/58  60-P 04/20/21 ? ?Charlene Brooke, CPP notified ? ?Sayge Salvato, CCMA ?Health concierge  ?[phone]  ?  ?

## 2021-05-10 ENCOUNTER — Ambulatory Visit: Payer: Medicare Other | Admitting: Student

## 2021-05-11 ENCOUNTER — Telehealth: Payer: Self-pay | Admitting: Pharmacist

## 2021-05-11 ENCOUNTER — Other Ambulatory Visit: Payer: Self-pay

## 2021-05-11 ENCOUNTER — Ambulatory Visit (INDEPENDENT_AMBULATORY_CARE_PROVIDER_SITE_OTHER): Payer: Medicare Other | Admitting: Pharmacist

## 2021-05-11 DIAGNOSIS — M810 Age-related osteoporosis without current pathological fracture: Secondary | ICD-10-CM

## 2021-05-11 DIAGNOSIS — I739 Peripheral vascular disease, unspecified: Secondary | ICD-10-CM

## 2021-05-11 DIAGNOSIS — J449 Chronic obstructive pulmonary disease, unspecified: Secondary | ICD-10-CM

## 2021-05-11 DIAGNOSIS — I10 Essential (primary) hypertension: Secondary | ICD-10-CM

## 2021-05-11 DIAGNOSIS — E039 Hypothyroidism, unspecified: Secondary | ICD-10-CM

## 2021-05-11 DIAGNOSIS — E785 Hyperlipidemia, unspecified: Secondary | ICD-10-CM

## 2021-05-11 MED ORDER — BUDESONIDE-FORMOTEROL FUMARATE 160-4.5 MCG/ACT IN AERO: 2.0000 | INHALATION_SPRAY | Freq: Two times a day (BID) | RESPIRATORY_TRACT | 3 refills | Status: DC

## 2021-05-11 NOTE — Telephone Encounter (Signed)
Patient had DEXA 09/2020 with T-score -3.1 (femoral neck) and had discussed pharmacologic treatments with PCP at that time, opted to defer treatment until after vascular surgery. Pt has now completed vascular surgery and is open to starting treatment for osteoporosis. Discussed Prolia vs oral bisphosphonate, pt opted for oral therapy. ? ?Recommend alendronate 70 mg weekly. Pt agreed and was counseled on medication. ? ?Forwarding to PCP for approval. ?

## 2021-05-11 NOTE — Progress Notes (Signed)
? ?Chronic Care Management ?Pharmacy Note ? ?05/11/2021 ?Name:  Jill Franco MRN:  [id number] DOB:  [birth year] ? ?Summary: CCM Initial visit ?-DEXA 09/2020 revealed osteoporosis (T -3.1 at femoral neck). Pt had deferred treatment until after vascular procedure which she has now completed, she is open to treatment now ?-Pt is taking Symbicort PRN mainly due to high cost ($130 per 3 months) ?-Pt is taking Benadryl occasionally for help with sleep; discussed risks of Benadryl use including memory impairment, constipation, urinary retention ? ?Recommendations/Changes made from today's visit: ?-Recommend Alendronate 70 mg weekly ?-[place] for Symbicort. Refilled Symbicort to manufacturer via office refill protocol ?-Advised to avoid Benadryl for sleep; try melatonin instead ? ?Plan: ?-[place] will call patient 1 month for medication update ?-Pharmacist follow up televisit scheduled for 6 months ? ? ? ?Subjective: ?Jill Franco is an 87 y.o. year old female who is a primary patient of Damita Dunnings, Elveria Rising, MD.  The CCM team was consulted for assistance with disease management and care coordination needs.   ? ?Engaged with patient face to face for initial visit in response to provider referral for pharmacy case management and/or care coordination services.  ? ?Consent to Services:  ?The patient was given the following information about Chronic Care Management services today, agreed to services, and gave verbal consent: 1. CCM service includes personalized support from designated clinical staff supervised by the primary care provider, including individualized plan of care and coordination with other care providers 2. 24/7 contact phone numbers for assistance for urgent and routine care needs. 3. Service will only be billed when office clinical staff spend 20 minutes or more in a month to coordinate care. 4. Only one practitioner may furnish and bill the service in a calendar month.  5.The patient may stop CCM services at any time (effective at the end of the month) by phone call to the office staff. 6. The patient will be responsible for cost sharing (co-pay) of up to 20% of the service fee (after annual deductible is met). Patient agreed to services and consent obtained. ? ?Patient Care Team: ?Tonia Ghent, MD as PCP - General (Family Medicine) ?Marica Otter, OD as Referring Physician (Optometry) ?Ronae Noell, Cleaster [place], [facility] as Pharmacist (Pharmacist) ? ?Recent office visits: ?03/25/21-PCP-Graham Duncan,MD OV: Edema -Increase Lasix -asked her to check her feet each AM. Hold HCTZ for now. ? ?[facility]) No medication changes ? ?10/22/20 Dr Damita Dunnings OV: f/u DEXA. Discussed treatment options. Pursue VitD testing and PAD eval before treating osteoporosis. Consider bisphos, Prolia in future. ? ?Recent consult visits: ?04/27/21-Vascular Surgery- Thomas Hawken,MD-Occlusive disease- schedule stent placement ?04/20/21-Vascular Surgery-Thomas Hawken,MD-follow up occlusive disease-Will plan CT angiogram of abdomen/pelvis with runoff to plan a revascularization. Continue aspirin xarelto ?04/12/21-Cardiology-Ceeste Cantwell,PA-Follow up leg swelling.- stop Plavix and start patient on low-dose Xarelto in combination with aspirin  Patient may reduce Lasix to as needed dosing. ?03/29/21-Cardiology-Celeste Cantwell,PA-Follow up Resume aspirin 62m and plavix  ?02/06/21-[place] [facility]- MJeannie DonePfeiffer,MD-COPD-Patient improved with 1 DuoNeb but still was dyspneic.  Currently getting hour continuous nebulizer treatment.  Suspected COPD if significantly improved consider discharge. 1 IV dose of Lasix given.  Will have patient increase her Lasix dose for 2 days.  Certainly also appears to be more significant component of COPD.  We will continue oral steroids and albuterol therapy ?01/12/21-Vascular Surgery-Thomas Hawken,MD- Post op hybrid  revascularization. ?12/01/20-Cardiology-Jay Ganji,MD- Peripheral artery disease-ECG,Stress test ? ?Hospital visits: ?04/30/21-[facility]-Thomas Hawken,MD- patient  had abdominal aortogram with stent placement procedure- no admission ? ?02/06/21-[place] [facility]- Jeannie Done Pfeiffer,MD-COPD-Patient improved with 1 DuoNeb but still was dyspneic.  Currently getting hour continuous nebulizer treatment.  Suspected COPD if significantly improved consider discharge. 1 IV dose of Lasix given.  Will have patient increase her Lasix dose for 2 days.  Certainly also appears to be more significant component of COPD.  We will continue oral steroids and albuterol therapy ? ? ?Objective: ? ?Lab Results  ?Component Value Date  ? CREATININE 0.80 04/30/2021  ? BUN 23 04/30/2021  ? GFR 65.49 03/25/2021  ? EGFR 61 04/06/2021  ? GFRNONAA 58 (L) 02/06/2021  ? GFRAA >60 04/26/2019  ? NA 139 04/30/2021  ? K 4.3 04/30/2021  ? CALCIUM 9.5 04/06/2021  ? CO2 24 04/06/2021  ? GLUCOSE 83 04/30/2021  ? ? ?Lab Results  ?Component Value Date/Time  ? HGBA1C 5.7 09/18/2020 09:18 AM  ? GFR 65.49 03/25/2021 12:52 PM  ? GFR 48.72 (L) 02/09/2021 12:44 PM  ?  ?Last diabetic Eye exam: No results found for: HMDIABEYEEXA  ?Last diabetic Foot exam: No results found for: HMDIABFOOTEX  ? ?Lab Results  ?Component Value Date  ? CHOL 114 12/04/2020  ? HDL 71 12/04/2020  ? [place] 36 12/04/2020  ? LDLDIRECT 115.8 06/19/2012  ? TRIG 37 12/04/2020  ? CHOLHDL 1.6 12/04/2020  ? ? ?Hepatic Function Latest Ref Rng & Units 02/06/2021 11/25/2020 07/02/2020  ?Total Protein 6.5 - 8.1 g/dL 6.3(L) 7.2 6.9  ?Albumin 3.5 - 5.0 g/dL 3.6 3.8 3.7  ?AST 15 - 41 U/L 37 29 20  ?ALT 0 - 44 U/L _0 ?Alk Phosphatase 38 - 126 U/L 66 69 99  ?Total Bilirubin 0.3 - 1.2 mg/dL 1.3(H) 0.7 0.5  ?Bilirubin, Direct 0.0 - 0.3 mg/dL - - -  ? ? ?Lab Results  ?Component Value Date/Time  ? TSH 2.82 06/16/2020 12:41 PM  ? TSH 1.18 09/12/2019 11:10 AM  ? FREET4 1.1 09/23/2008 09:07 AM  ? FREET4 1.1  06/04/2007 10:00 AM  ? ? ?CBC Latest Ref Rng & Units 04/30/2021 02/06/2021 12/04/2020  ?WBC 4.0 - 10.5 K/uL - 7.6 12.0(H)  ?Hemoglobin 12.0 - 15.0 g/dL 13.6 15.2(H) 12.1  ?Hematocrit 36.0 - 46.0 % 40.0 45.3 35.0(L)  ?Platelets 150 - 400 K/uL - 225 149(L)  ? ? ?Lab Results  ?Component Value Date/Time  ? VD25OH 53.92 10/22/2020 10:26 AM  ? VD25OH 26 (L) 09/23/2008 09:54 PM  ? ? ?Clinical ASCVD: Yes  ?The ASCVD Risk score (Arnett DK, et al., 2019) failed to calculate for the following reasons: ?  The 2019 ASCVD risk score is only valid for ages 62 to 88 ?  The patient has a prior MI or stroke diagnosis   ? ?Depression screen [facility] 2/9 09/18/2020 09/12/2019 08/17/2017  ?Decreased Interest 0 0 0  ?Down, Depressed, Hopeless 0 0 0  ?PHQ - 2 Score 0 0 0  ?Altered sleeping 0 - 0  ?Tired, decreased energy 0 - 0  ?Change in appetite 0 - 0  ?Feeling bad or failure about yourself  0 - 0  ?Trouble concentrating 0 - 0  ?Moving slowly or fidgety/restless 0 - 0  ?Suicidal thoughts 0 - 0  ?PHQ-9 Score 0 - 0  ?Difficult doing work/chores Not difficult at all - Not difficult at all  ?Some recent data might be hidden  ?  ? ?Social History  ? ?Tobacco Use  ?Smoking Status Former  ? Packs/day: 0.25  ? Years:  69.00  ? Pack years: 17.25  ? Types: Cigarettes  ? Quit date: 01/2021  ? Years since quitting: 0.3  ? Passive exposure: Current  ?Smokeless Tobacco Never  ? ?BP Readings from Last 3 Encounters:  ?04/30/21 (!) 121/54  ?04/20/21 (!) 115/58  ?04/12/21 117/68  ? ?Pulse Readings from Last 3 Encounters:  ?04/30/21 66  ?04/20/21 60  ?04/12/21 62  ? ?Wt Readings from Last 3 Encounters:  ?04/30/21 104 lb (47.2 kg)  ?04/20/21 106 lb 3.2 oz (48.2 kg)  ?04/12/21 106 lb 12.8 oz (48.4 kg)  ? ?BMI Readings from Last 3 Encounters:  ?04/30/21 17.85 kg/m?  ?04/20/21 18.23 kg/m?  ?04/12/21 18.33 kg/m?  ? ? ?Assessment/Interventions: Review of patient past medical history, allergies, medications, health status, including review of consultants reports,  laboratory and other test data, was performed as part of comprehensive evaluation and provision of chronic care management services.  ? ?SDOH:  (Social Determinants of Health) assessments and interventions performed: Yes ?SDOH Interventions

## 2021-05-11 NOTE — Patient Instructions (Signed)
Visit Information ? ?Phone number for Pharmacist: [phone] ? ?Thank you for meeting with me to discuss your medications! I look forward to working with you to achieve your health care goals. Below is a summary of what we talked about during the visit: ? ? Goals Addressed   ? ?  ?  ?  ?  ? This Visit's Progress  ?  Manage My Medicine     ?  Timeframe:  Long-Range Goal ?Priority:  Medium ?Start Date:        05/11/21                     ?Expected End Date:      05/12/22                ? ?Follow Up Date Sept 2023 ?  ?- call for medicine refill 2 or 3 days before it runs out ?- call if I am sick and can't take my medicine ?- keep a list of all the medicines I take; vitamins and herbals too ?- use a pillbox to sort medicine  ?  ?Why is this important?   ?These steps will help you keep on track with your medicines. ?  ?Notes:  ?  ? ?  ? ? ?Care Plan : [place]  ?Updates made by Charlton Haws, RPH since 05/11/2021 12:00 AM  ?  ? ?Problem: Hypertension, Hyperlipidemia, COPD, Hypothyroidism, and Osteoporosis   ?Priority: High  ?  ? ?Long-Range Goal: Disease mgmt   ?Start Date: 05/11/2021  ?Expected End Date: 05/12/2022  ?This Visit's Progress: On track  ?Priority: High  ?Note:   ?Current Barriers:  ?Unable to independently afford treatment regimen ?Suboptimal therapeutic regimen for Osteoporosis ? ?Pharmacist Clinical Goal(s):  ?Patient will verbalize ability to afford treatment regimen ?adhere to plan to optimize therapeutic regimen for Osteoporosis as evidenced by report of adherence to recommended medication management changes through collaboration with PharmD and provider.  ? ?Interventions: ?1:1 collaboration with Jill Ghent, MD regarding development and update of comprehensive plan of care as evidenced by provider attestation and co-signature ?Inter-disciplinary care team collaboration (see longitudinal plan of care) ?Comprehensive medication review performed; medication list updated in  electronic medical record ? ?Hypertension (BP goal <130/80) ?-Controlled - BP is at goal in office; pt denies s/sx of hypotension ?-Current home readings: n/a ?-Current treatment: ?Enalapril 10 mg - 1/2 tab AM, 1 tab PM - Appropriate, Effective, Safe, Accessible ?Furosemide 20 mg BID -  Appropriate, Effective, Safe, Accessible ?HCTZ 12.5 mg daily-  Appropriate, Effective, Safe, Accessible ?Metoprolol tartrate 100 mg - 1/2 tab AM, 1 tab PM - Appropriate, Effective, Safe, Accessible ?-Medications previously tried: n/a  ?-Denies hypotensive/hypertensive symptoms ?-Educated on BP goals and benefits of medications for prevention of heart attack, stroke and kidney damage; Daily salt intake goal < 2300 mg; Symptoms of hypotension and importance of maintaining adequate hydration; ?-Counseled to monitor BP at home periodically ?-Recommended to continue current medication ? ?Hyperlipidemia / ASCVD (LDL goal < 70) ?-Controlled - LDL 36 (11/2020) at goal; pt endorses compliance with rosuvastatin; per pharmacy pravastatin has been filled at same time as rosuvastatin, pt confirms only taking rosuvastatin; she has also stopped clopdigrel and started Xarelto, denies issue so far ?-Hx PAD. Hx CVA (MRI 2019) ?-Current treatment: ?Rosuvastatin 20 mg daily - Appropriate, Effective, Safe, Accessible ?Xarelto 2.5 mg BID - Appropriate, Effective, Safe, Accessible ?Aspirin 81 mg daily - Appropriate, Effective, Safe, Accessible ?-Medications previously tried: clopidogrel  ?-  Educated on Cholesterol goals; Benefits of statin for ASCVD risk reduction; ?-Discussed low-dose Xarelto indication for PAD ?-Recommended to continue current medication ? ?COPD (Goal: control symptoms and prevent exacerbations) ?-Controlled - pt reports using Symbicort and albuterol both PRN; she also has quit smoking since last COPD exacerbation 01/2021 which served as a "wake up call"; Symbicort cost limits use as maintenance inhaler ?-Gold Grade: unable to  assess ?-Current COPD Classification:  A (low sx, <2 exacerbations/yr) ?-MMRC/CAT score: not on file ?-Pulmonary function testing: not on file ?-Exacerbations requiring treatment in last 6 months: 1 ?-Current treatment  ?Symbicort HFA 160-4.5 mcg/act 2 puff BID - Appropriate, Effective, Safe, Query Accessible ?Albuterol HFA PRN -Appropriate, Effective, Safe, Accessible ?-Medications previously tried: n/a  ?-Patient denies consistent use of maintenance inhaler ?-Frequency of rescue inhaler use: daily or QOD ?-Counseled on Proper inhaler technique; Benefits of consistent maintenance inhaler use ?-[place] for Symbicort via online portal. Ordered refill to [facility] mail order. ?-Recommended to continue current medication ? ?Osteoporosis (Goal prevent fractures) ?-Not ideally controlled - pt has discussed treatment options with PCP previously but opted to defer until after vascular surgery; she has now completed procedure and open to starting treatment; she denies significant issues with reflux/heartburn ?-Last DEXA Scan: 09/2020  ? T-Score femoral neck: -3.1 ? T-Score forearm radius: -2.5 ?-Patient is a candidate for pharmacologic treatment due to T-Score < -2.5 in femoral neck ?-Current treatment  ?Vitamin D 1000 IU daily -Appropriate, Effective, Safe, Accessible ?-Medications previously tried: n/a  ?-Recommend [id number] units of vitamin D daily. Counseled on oral bisphosphonate administration: take in the morning, 30 minutes prior to food with 6-8 oz of water. Do not lie down for at least 30 minutes after taking. Recommend weight-bearing and muscle strengthening exercises for building and maintaining bone density. ?-Recommend alendronate 70 mg weekly - consulting with PCP. ? ?Hypothyroidism (Goal: maintain TSH in goal range) ?-Controlled - TSH at goal; pt takes levothyroxine with other meds in the morning ?-Current treatment  ?Levothyroxine 50 mcg daily except 2 tab Sun/Wed - Appropriate, Effective, Safe,  Accessible ?-Medications previously tried: n/a ?-Advised pt to continue same administration (do not move levothyroxine as TSH is at goal)  ?-Recommended to continue current medication ? ?Health Maintenance ?-Vaccine gaps: TDAP, covid booster ?-Current therapy:  ?Methocarbamol 500 mg PRN - Appropriate, Effective, Safe, Accessible ?Benadryl 25 mg PRN (sleep) - Query appropriate ?-Discussed risks associated with Benadryl- memory impairment, constipation, urinary retention. Advised to try melatonin 5 mg - 1/2 tab 30 min before bedtime ? ?Patient Goals/Self-Care Activities ?Patient will:  ?- take medications as prescribed as evidenced by patient report and record review ?focus on medication adherence by pill box ?collaborate with provider on medication access solutions ?target a minimum of 150 minutes of moderate intensity exercise weekly ?  ?  ? ?Jill Franco was given information about Chronic Care Management services today including:  ?CCM service includes personalized support from designated clinical staff supervised by her physician, including individualized plan of care and coordination with other care providers ?24/7 contact phone numbers for assistance for urgent and routine care needs. ?Standard insurance, coinsurance, copays and deductibles apply for chronic care management only during months in which we provide at least 20 minutes of these services. Most insurances cover these services at 100%, however patients may be responsible for any copay, coinsurance and/or deductible if applicable. This service may help you avoid the need for more expensive face-to-face services. ?Only one practitioner may furnish and bill the service in a  calendar month. ?The patient may stop CCM services at any time (effective at the end of the month) by phone call to the office staff. ? ?Patient agreed to services and verbal consent obtained.  ? ?The patient verbalized understanding of instructions, educational materials, and care  plan provided today and declined offer to receive copy of patient instructions, educational materials, and care plan.  ?Telephone follow up appointment with pharmacy team member scheduled for: 6 months ? ?Charlene Brooke,

## 2021-05-12 MED ORDER — ALENDRONATE SODIUM 70 MG PO TABS: 70.0000 mg | ORAL_TABLET | ORAL | 3 refills | Status: DC

## 2021-05-12 NOTE — Telephone Encounter (Signed)
I sent the prescription for alendronate.  If she has any trouble taking it with heartburn or trouble swallowing then stop and let me know.  Thanks. ?

## 2021-05-12 NOTE — Addendum Note (Signed)
Addended by: Tonia Ghent on: 05/12/2021 09:58 AM ? ? Modules accepted: Orders ? ?

## 2021-05-12 NOTE — Telephone Encounter (Signed)
Called notified pt that medication has been sent to pharmacy for heartburn and to call us back if she has any issues. ?

## 2021-05-19 ENCOUNTER — Telehealth: Payer: Self-pay | Admitting: Family Medicine

## 2021-05-19 MED ORDER — RIVAROXABAN 2.5 MG PO TABS: 2.5000 mg | ORAL_TABLET | Freq: Two times a day (BID) | ORAL | 3 refills | Status: DC

## 2021-05-19 NOTE — Telephone Encounter (Signed)
Pt has a 14 day supply of rivaroxaban (XARELTO) 2.5 MG TABS tablet, wants to make sure the next prescription goes through [facility] and not to [facility], please advise.  ? ? ?

## 2021-05-19 NOTE — Telephone Encounter (Signed)
Rx has [facility]  ?

## 2021-05-21 DIAGNOSIS — E785 Hyperlipidemia, unspecified: Secondary | ICD-10-CM | POA: Diagnosis not present

## 2021-05-21 DIAGNOSIS — J449 Chronic obstructive pulmonary disease, unspecified: Secondary | ICD-10-CM

## 2021-05-21 DIAGNOSIS — M810 Age-related osteoporosis without current pathological fracture: Secondary | ICD-10-CM

## 2021-05-21 DIAGNOSIS — I10 Essential (primary) hypertension: Secondary | ICD-10-CM

## 2021-05-21 DIAGNOSIS — E039 Hypothyroidism, unspecified: Secondary | ICD-10-CM

## 2021-05-25 ENCOUNTER — Other Ambulatory Visit: Payer: Self-pay

## 2021-05-25 ENCOUNTER — Telehealth: Payer: Self-pay

## 2021-05-25 DIAGNOSIS — I739 Peripheral vascular disease, unspecified: Secondary | ICD-10-CM

## 2021-05-25 NOTE — Chronic Care Management (AMB) (Signed)
? ? ?Chronic Care Management ?Pharmacy Assistant  ? ?Name: Jill Franco  MRN: [id number] DOB: 22-Jul-1934 ? ? ?Reason for Encounter: General Adherence ?  ? ?Recent office visits:  ?None since last CCM contact ? ?Recent consult visits:  ?None since last CCM contact ? ?Hospital visits:  ?None in previous 6 months ? ?Medications: ?Outpatient Encounter Medications as of 05/25/2021  ?Medication Sig Note  ? albuterol (PROAIR HFA) 108 (90 Base) MCG/ACT inhaler Inhale 2 puffs into the lungs every 6 (six) hours as needed for wheezing or shortness of breath.   ? alendronate (FOSAMAX) 70 MG tablet Take 1 tablet (70 mg total) by mouth every 7 (seven) days. Take with a full glass of water on an empty stomach.   ? aspirin EC 81 MG tablet Take 1 tablet (81 mg total) by mouth daily. Swallow whole.   ? budesonide-formoterol (SYMBICORT) 160-4.5 MCG/ACT inhaler Inhale 2 puffs into the lungs in the morning and at bedtime.   ? Cholecalciferol (VITAMIN D3) 25 MCG (1000 UT) CAPS Take 1 capsule (1,000 Units total) by mouth daily.   ? diphenhydrAMINE (BENADRYL) 25 MG tablet Take 25 mg by mouth at bedtime as needed for sleep. 04/28/2021: Seldom   ? enalapril (VASOTEC) 10 MG tablet TAKE 1 AND 1/2 TABLETS BY  MOUTH DAILY (Patient taking differently: Take 5-10 mg by mouth See admin instructions. Take 0.5 tablet (5 mg) by mouth in the morning & take 1 tablet (10 mg) by mouth at night.)   ? furosemide (LASIX) 20 MG tablet Take 1-2 tablets (20-40 mg total) by mouth daily. (Patient taking differently: Take 20 mg by mouth 2 (two) times daily.)   ? hydrochlorothiazide (MICROZIDE) 12.5 MG capsule Take 12.5 mg by mouth every evening.   ? levothyroxine (SYNTHROID) 50 MCG tablet TAKE 1 TABLET BY MOUTH  DAILY EXCEPT 2 TABLETS ON  SUNDAYS AND WEDNESDAYS (  TOTAL 9 TABS PER WEEK)   ? methocarbamol (ROBAXIN) 500 MG tablet TAKE 1 TABLET BY MOUTH EVERY 12 HOURS AS NEEDED FOR MUSCLE SPASM   ? metoprolol tartrate (LOPRESSOR) 100 MG tablet TAKE ONE-HALF TABLET BY   MOUTH IN THE MORNING AND 1  TABLET BY MOUTH IN THE  EVENING   ? rivaroxaban (XARELTO) 2.5 MG TABS tablet Take 1 tablet (2.5 mg total) by mouth 2 (two) times daily.   ? rosuvastatin (CRESTOR) 20 MG tablet Take 1 tablet (20 mg total) by mouth daily.   ? ?No facility-administered encounter medications on file as of 05/25/2021.  ? ?Contacted Jill Franco on 05/25/21  for general disease state and medication adherence call.  ? ?Patient is not more than 5 days past due for refill on the following medications per chart history: ? ?[facility] Medications: ?Medication Name/mg Last Fill Days Supply ?Enalapril '10mg'$   05/15/21 90 ?Rosuvastatin '20mg'$   04/14/21 90 ?Pravastatin '10mg'$   05/15/21 90 ?Alendronate '70mg'$   05/12/21 91 ? ? ?What concerns do you have about your medications? The patient is having nose bleeds and wonders if its medication related  ? ?The patient denies side effects with their medications.  ? ?How often do you forget or accidentally miss a dose? Never ? ?Do you use a pillbox? Yes ? ?Are you having any problems getting your medications from your pharmacy? No  uses mail order and for short supply she uses [facility]  ? ?Has the cost of your medications been a concern? No ? ?Since last visit with CPP, the following interventions have been made. The patient has had  2 doses of Alendronate '70mg'$   takes every Thursday. No complications related to taking this medication. The patient also has started using melatonin at night when she has trouble falling asleep and this is helping . ? ?The patient has not had an ED visit since last contact.  ? ?The patient reports the following problems with their health. Reports nose bleeds for the last 2 days and does not know if related to any medications. She is on Xarelto and stopped it a day. The patient has a call in to the PCP office . ? ?Patient denies concerns or questions for Jill Franco, PharmD at this time.  ? ?Counseled patient on:  ?Importance of taking medication daily  without missed doses, Benefits of adherence packaging or a pillbox, and Access to CCM team for any cost, medication or pharmacy concerns. ? ? ?Care Gaps: ?Annual wellness visit in last year? No ?Most Recent BP reading: 115/58  ? ? ?Upcoming appointments: ?No appointments scheduled within the next 30 days. ? ?Jill Franco, CPP notified ? ?Jill Franco, CCMA ?Health concierge  ?[phone] ?

## 2021-05-25 NOTE — Telephone Encounter (Addendum)
I would try to continue xarelto.  If she has recurrent bleeding then needs eval and it would be okay to skip a dose in that situation.  Would try miralax for constipation, taken daily prn.  Let me know/seek eval if that isn't helping.  Thanks.  ?

## 2021-05-25 NOTE — Telephone Encounter (Signed)
Spoke with patient and advised patient about message from Dr. Damita Dunnings. Patient verbalized understanding.  ?

## 2021-05-25 NOTE — Telephone Encounter (Signed)
Pt called requesting a call back she has question and concern about RX rivaroxaban (XARELTO) 2.5 MG TABS tablet . Having possible side effect . Nosebleed and constipation . Please advise [phone]  ? ?

## 2021-05-25 NOTE — Telephone Encounter (Addendum)
I spoke with pt; pt said since in hospital in March 2023 on and off pt has had problems with nose bleed and constipation on and off. Last nosebleed  was last night 05/24/21; pt said nosebleed was off and on for 30 -45 mins;not a heavy nosebleed but bright red blood on rt side of nose; when pt would hold tissue to nose the blood on tissue was about the size of a nickel. Pt did not clamp her nose with her fingers the bleeding stopped on its own. Pt usually take xarelto 2.5 mg bid but pt did not take xarelto last night or this morning. Pt did not have any bleeding last night or this morning.Pt said she usually goes approx 5 days without BM due to constipation. Pt said she only sees blood when strains for a long time trying to have BM. Does not see blood in commode water but small amt of blood on tissue when pt wipes. Pt is sure comes from rectum. Pt said she cannot rmember the last normal BM. Pt said she takes 3 stool softeners each time. Pt said last constipated BM was 05/23/21 and now pt said she feels tight and swollen at naval.Pt wants to know if should keep taking the xarelto or not. UC & ED precautions given and pt voiced understanding but would appreciate cb today after DR Damita Dunnings reviews this note. Sending note to Dr Damita Dunnings and Janett Billow CMA.have already teams [place] CMA. ?

## 2021-06-01 ENCOUNTER — Ambulatory Visit: Payer: Medicare Other | Admitting: Student

## 2021-06-07 ENCOUNTER — Encounter: Payer: Self-pay | Admitting: Family

## 2021-06-07 ENCOUNTER — Ambulatory Visit: Payer: Medicare Other | Admitting: Family

## 2021-06-07 ENCOUNTER — Other Ambulatory Visit: Payer: Self-pay | Admitting: Family

## 2021-06-07 ENCOUNTER — Telehealth: Payer: Self-pay

## 2021-06-07 VITALS — BP 120/60 | Temp 97.8°F | Resp 16 | Ht 64.0 in | Wt 105.4 lb

## 2021-06-07 DIAGNOSIS — R42 Dizziness and giddiness: Secondary | ICD-10-CM | POA: Diagnosis not present

## 2021-06-07 DIAGNOSIS — R5383 Other fatigue: Secondary | ICD-10-CM

## 2021-06-07 DIAGNOSIS — R799 Abnormal finding of blood chemistry, unspecified: Secondary | ICD-10-CM

## 2021-06-07 DIAGNOSIS — I951 Orthostatic hypotension: Secondary | ICD-10-CM | POA: Diagnosis not present

## 2021-06-07 LAB — COMPREHENSIVE METABOLIC PANEL
ALT: 14 U/L (ref 0–35)
AST: 19 U/L (ref 0–37)
Albumin: 4.3 g/dL (ref 3.5–5.2)
Alkaline Phosphatase: 75 U/L (ref 39–117)
BUN: 38 mg/dL — ABNORMAL HIGH (ref 6–23)
CO2: 34 mEq/L — ABNORMAL HIGH (ref 19–32)
Calcium: 9.5 mg/dL (ref 8.4–10.5)
Chloride: 95 mEq/L — ABNORMAL LOW (ref 96–112)
Creatinine, Ser: 1.18 mg/dL (ref 0.40–1.20)
GFR: 42.25 mL/min — ABNORMAL LOW (ref >60.00)
Glucose, Bld: 89 mg/dL (ref 70–99)
Potassium: 3.6 mEq/L (ref 3.5–5.1)
Sodium: 137 mEq/L (ref 135–145)
Total Bilirubin: 0.5 mg/dL (ref 0.2–1.2)
Total Protein: 7 g/dL (ref 6.0–8.3)

## 2021-06-07 LAB — CBC
HCT: 40.1 % (ref 36.0–46.0)
Hemoglobin: 13.4 g/dL (ref 12.0–15.0)
MCHC: 33.4 g/dL (ref 30.0–36.0)
MCV: 93.7 fl (ref 78.0–100.0)
Platelets: 229 10*3/uL (ref 150.0–400.0)
RBC: 4.28 Mil/uL (ref 3.87–5.11)
RDW: 13.1 % (ref 11.5–15.5)
WBC: 9.9 10*3/uL (ref 4.0–10.5)

## 2021-06-07 LAB — B12 AND FOLATE PANEL
Folate: >23.5 ng/mL (ref >5.9)
Vitamin B-12: 498 pg/mL (ref 211–911)

## 2021-06-07 NOTE — Telephone Encounter (Signed)
[facility] - Client ?TELEPHONE ADVICE RECORD ?AccessNurse? ?Patient ?Name: ?[name] ?FOOT ?Gender: Female ?DOB: 1934-02-25 ?Age: 87 Y 17 D ?Return ?Phone ?Number: ?[id number] ?(Primary) ?Address: ?City/ ?State/ ?Zip: ?[place] ? [address] ?Client [facility] - Client ?Client Site [facility] Day ?Provider Renford Dills - MD ?Contact Type Call ?Who Is Calling Patient / Member / Family / Caregiver ?Call Type Triage / Clinical ?Relationship To Patient Self ?Return Phone Number [phone] (Primary) ?Chief Complaint Dizziness ?Reason for Call Symptomatic / Request for Health Information ?Initial Comment Caller states she has dizziness effecting driving. ?Translation No ?Nurse Assessment ?Nurse: Hardin Negus, RN, Mardene Celeste Date/Time [facility] Time): 06/07/2021 8:34:18 AM ?Confirm and document reason for call. If ?symptomatic, describe symptoms. ?---She has been having vertigo. She gets dizzy when ?she walks and it is affecting her driving. Started on ?Friday night ?Does the patient have any new or worsening ?symptoms? ---Yes ?Will a triage be completed? ---Yes ?Related visit to physician within the last 2 weeks? ---No ?Does the PT have any chronic conditions? (i.e. ?diabetes, asthma, this includes High risk factors for ?pregnancy, etc.) ?---Yes ?List chronic conditions. ---cardiac issues ?Is this a behavioral health or substance abuse call? ---No ?Guidelines ?Guideline Title Affirmed Question Affirmed Notes Nurse Date/Time (Eastern ?Time) ?Dizziness - Vertigo [1] Dizziness ?(vertigo) present ?now AND [2] one ?or more STROKE ?RISK FACTORS ?(i.e., hypertension, ?diabetes, prior stroke/ ?TIA, heart attack) ?(Exception: prior ?physician evaluation ?for this AND no ?Hardin Negus, RN, Mardene Celeste 06/07/2021 8:36:02 ?AM ?PLEASE NOTE: All timestamps contained within this report are represented as [place] Standard Time. ?CONFIDENTIALTY NOTICE: This fax  transmission is intended only for the addressee. It contains information that is legally privileged, confidential or ?otherwise protected from use or disclosure. If you are not the intended recipient, you are strictly prohibited from reviewing, disclosing, copying using ?or disseminating any of this information or taking any action in reliance on or regarding this information. If you have received this fax in error, please ?notify us immediately by telephone so that we can arrange for its return to [place]. Phone: [phone], Toll-Free: [phone], Fax: [phone] ?Page: 2 of 2 ?Call Id: [id number] ?Guidelines ?Guideline Title Affirmed Question Affirmed Notes Nurse Date/Time (Eastern ?Time) ?different/worse than ?usual) ?Disp. Time (Eastern ?Time) Disposition Final User ?06/07/2021 8:38:56 AM Go to ED Now (or PCP triage) Yes Hardin Negus, RN, Mardene Celeste ?Caller Disagree/Comply Comply ?Caller Understands Yes ?PreDisposition Call Doctor ?Care Advice Given Per Guideline ?GO TO ED NOW (OR PCP TRIAGE): NOTE TO TRIAGER - DRIVING: CARE ADVICE given per Dizziness - Vertigo (Adult) ?guideline. ?Comments ?User: Ledora Bottcher, RN Date/Time [facility] Time): 06/07/2021 8:44:12 AM ?I connected her to the office . ?Referrals ?GO TO FACILITY REFUSE ?

## 2021-06-07 NOTE — Patient Instructions (Addendum)
D/c the HCTZ (hydrochlorothiazide) as well as the Lasix for two days.  ?See if lightheaded feeling improves.  ?Make sure you're drinking enough water throughout the day.  ? ?Check blood pressure twice daily, and follow up in two days. ? ?Stop by the lab prior to leaving today. I will notify you of your results once received.  ? ?Due to recent changes in healthcare laws, you may see results of your imaging and/or laboratory studies on MyChart before I have had a chance to review them.  I understand that in some cases there may be results that are confusing or concerning to you. Please understand that not all results are received at the same time and often I may need to interpret multiple results in order to provide you with the best plan of care or course of treatment. Therefore, I ask that you please give me 2 business days to thoroughly review all your results before contacting my office for clarification. Should we see a critical lab result, you will be contacted sooner.  ? ?It was a pleasure seeing you today! Please do not hesitate to reach out with any questions and or concerns. ? ?Regards,  ? ?Adah Stoneberg ?FNP-C ? ?

## 2021-06-07 NOTE — Assessment & Plan Note (Signed)
Ekg today in office, bradycardia stable from prior (pt on metoprolol so brady expected) ?Ordering cmp and cbc to r/o anemia, electrolyte abn or kidney dysfunction. pending results ?Advised pt to increase water intake.  ?Not thought to be vertigo, negative on PE ?

## 2021-06-07 NOTE — Assessment & Plan Note (Addendum)
D/c lasix and hctz x 2 days ?Increase water intake ?F/u in two days to reassess ?Cbc cmp pending results ? ?If any cp sob go to er and or call 911 ?

## 2021-06-07 NOTE — Progress Notes (Signed)
? ?Established Patient Office Visit ? ?Subjective:  ?Patient ID: Jill Franco, female    DOB: 10/20/1934  Age: 87 y.o. MRN: [id number] ? ?CC:  ?Chief Complaint  ?Patient presents with  ? Dizziness  ?  Since Friday evening  ? ? ?HPI ?Jill Franco is here today with concerns.  ? ?Four days ago with dizziness, room is not spinning. When she goes to stand up hard to walk without feeling dizziness. As long as she is sitting she feels fine. Has improved over the last few days.  ? ?No chest pain palp and or sob.  ?Has had this occur maybe five years ago,and was diagnosed with vertigo.  ?No ear pain. No sinus pressure, no nasal congestion.  ?Doesn't seem to change with turning head. ? ?Orthostatic VS for the past 24 hrs: ? BP- Lying BP- Sitting BP- Standing at 0 minutes  ?06/07/21 1236 1'20/80 90/60 90/50 '$  ? ?  ?Past Medical History:  ?Diagnosis Date  ? Cataract   ? bilaterally corrected.  ? COPD (chronic obstructive pulmonary disease) ([place])   ? CVA (cerebral vascular accident) [facility])   ? Pt was unaware and does not know when it happened.  Was told after some testing was done.  ? Double vision   ? Dyspnea   ? Facial droop 10/2017  ? left  ? History of shingles 2012  ? HLD (hyperlipidemia)   ? HTN (hypertension)   ? Hypothyroidism   ? PVD (peripheral vascular disease) ([place])   ? Vision loss   ? temporary  ? ? ?Past Surgical History:  ?Procedure Laterality Date  ? ABDOMINAL AORTOGRAM N/A 04/30/2021  ? Procedure: ABDOMINAL AORTOGRAM;  Surgeon: [name], MD;  Location: [facility] CV LAB;  Service: Cardiovascular;  Laterality: N/A;  ? ABDOMINAL EXPLORATION SURGERY  1950s  ? "they thought I was pregnant; went in to explore"  ? APPENDECTOMY  childhood  ? BACK SURGERY    ? BREAST LUMPECTOMY Left 1960's  ? benign  ? carotid ultrasound  06/19/2007  ? 0-39% stenosis  ? CATARACT EXTRACTION W/ INTRAOCULAR LENS IMPLANT Left 09/2016  ? CATARACT EXTRACTION W/ INTRAOCULAR LENS IMPLANT Right 10/2016  ? COLONOSCOPY WITH PROPOFOL  N/A 04/08/2019  ? Procedure: COLONOSCOPY WITH PROPOFOL;  Surgeon: Milus Banister, MD;  Location: [facility] ENDOSCOPY;  Service: Endoscopy;  Laterality: N/A;  ? DILATION AND CURETTAGE OF UTERUS  1960's X 3  ? miscarriages  ? DOPPLER ECHOCARDIOGRAPHY  06/19/2007  ? Normal EF 55-70%  ? ENDARTERECTOMY FEMORAL Left 12/03/2020  ? Procedure: LEFT FEMORAL ENDARTERECTOMY AND PROFUNDAPLASTY;  Surgeon: [name], MD;  Location: [facility];  Service: Vascular;  Laterality: Left;  ? ILIAC ARTERY STENT Left 03/18/2016  ? common iliac stent (6 x 40), aortic stent (10 x 19)/notes 03/18/2016  ? INSERTION OF ILIAC STENT Left 12/03/2020  ? Procedure: AORTIC STENTING, INSERTION OF BILATERAL COMMON ILIAC STENTS, AND LEFT EXTERNAL ILIAC STENT;  Surgeon: [name], MD;  Location: [facility] OR;  Service: Vascular;  Laterality: Left;  ? LAMINECTOMY AND MICRODISCECTOMY SPINE  1960's X 2  ? LOWER EXTREMITY ANGIOGRAM N/A 12/03/2020  ? Procedure: Andris Baumann;  Surgeon: [name], MD;  Location: [facility];  Service: Vascular;  Laterality: N/A;  ? MOLE REMOVAL  07/10/2016  ? NSVD    ? x1  ? PATCH ANGIOPLASTY Left 12/03/2020  ? Procedure: PATCH ANGIOPLASTY OF LEFT COMMON FEMORAL ARTERY USING BOVINE PERICARDIUM PATCH;  Surgeon: [name], MD;  Location[facility]  OR;  Service: Vascular;  Laterality: Left;  ? PERIPHERAL VASCULAR BALLOON ANGIOPLASTY Right 04/30/2021  ? Procedure: PERIPHERAL VASCULAR BALLOON ANGIOPLASTY;  Surgeon: [name], MD;  Location: [place] [facility] CV LAB;  Service: Cardiovascular;  Laterality: Right;  rt common iliac  ? PERIPHERAL VASCULAR CATHETERIZATION N/A 03/18/2016  ? Procedure: Abdominal Aortogram w/Lower Extremity;  Surgeon: Elam Dutch, MD;  Location: [facility] CV LAB;  Service: Cardiovascular;  Laterality: N/A;  ? PERIPHERAL VASCULAR CATHETERIZATION  03/18/2016  ? Procedure: Peripheral Vascular Intervention;  Surgeon: Elam Dutch, MD;  Location: [place] CV LAB;  Service: Cardiovascular;;  ?  PERIPHERAL VASCULAR INTERVENTION Left 04/30/2021  ? Procedure: PERIPHERAL VASCULAR INTERVENTION;  Surgeon: [name], MD;  Location: [facility] CV LAB;  Service: Cardiovascular;  Laterality: Left;  lt common iliac  ? POLYPECTOMY  04/08/2019  ? Procedure: POLYPECTOMY;  Surgeon: Milus Banister, MD;  Location: [facility];  Service: Endoscopy;;  ? ULTRASOUND GUIDANCE FOR VASCULAR ACCESS Right 12/03/2020  ? Procedure: ULTRASOUND GUIDANCE FOR VASCULAR ACCESS, RIGHT FEMORAL ARTERY;  Surgeon: [name], MD;  Location: [facility];  Service: Vascular;  Laterality: Right;  ? ? ?Family History  ?Problem Relation Age of Onset  ? Stroke Father 74  ? Cancer Brother   ? Colon cancer Neg Hx   ? Breast cancer Neg Hx   ? Colon polyps Neg Hx   ? Esophageal cancer Neg Hx   ? Rectal cancer Neg Hx   ? Stomach cancer Neg Hx   ? ? ?Social History  ? ?Socioeconomic History  ? Marital status: Widowed  ?  Spouse name: Not on file  ? Number of children: 1  ? Years of education: Not on file  ? Highest education level: Not on file  ?Occupational History  ? 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  ?  Comment: Glass blower/designer  ?Tobacco Use  ? Smoking status: Former  ?  Packs/day: 0.25  ?  Years: 69.00  ?  Pack years: 17.25  ?  Types: Cigarettes  ?  Quit date: 01/2021  ?  Years since quitting: 0.3  ?  Passive exposure: Current  ? Smokeless tobacco: Never  ?Vaping Use  ? Vaping Use: Never used  ?Substance and Sexual Activity  ? Alcohol use: No  ?  Alcohol/week: 0.0 standard drinks  ? Drug use: No  ? Sexual activity: Not on file  ?Other Topics Concern  ? Not on file  ?Social History Narrative  ? From [employer]  ? Retired 2007  ? Widowed 2008 after 49 years  ? Enjoys yard work, but needs some help with yard work now.    ? Her daughter is helping at home some (some as of 2022).  Her daughter has vision loss and is widowed as of 2022 and moved in with patient.  ? ?Social Determinants of Health  ? ?Financial Resource Strain: Medium Risk  ? Difficulty  of Paying Living Expenses: Somewhat hard  ?Food Insecurity: No Food Insecurity  ? Worried About Charity fundraiser in the Last Year: Never true  ? Ran Out of Food in the Last Year: Never true  ?Transportation Needs: Not on file  ?Physical Activity: Not on file  ?Stress: Not on file  ?Social Connections: Not on file  ?Intimate Partner Violence: Not on file  ? ? ?Outpatient Medications Prior to Visit  ?Medication Sig Dispense Refill  ? albuterol (PROAIR HFA) 108 (90 Base) MCG/ACT inhaler Inhale 2 puffs into the lungs every 6 (six)  hours as needed for wheezing or shortness of breath. 18 g 5  ? alendronate (FOSAMAX) 70 MG tablet Take 1 tablet (70 mg total) by mouth every 7 (seven) days. Take with a full glass of water on an empty stomach. 13 tablet 3  ? aspirin EC 81 MG tablet Take 1 tablet (81 mg total) by mouth daily. Swallow whole. 90 tablet 3  ? budesonide-formoterol (SYMBICORT) 160-4.5 MCG/ACT inhaler Inhale 2 puffs into the lungs in the morning and at bedtime. 30.6 g 3  ? Cholecalciferol (VITAMIN D3) 25 MCG (1000 UT) CAPS Take 1 capsule (1,000 Units total) by mouth daily.    ? diphenhydrAMINE (BENADRYL) 25 MG tablet Take 25 mg by mouth at bedtime as needed for sleep.    ? enalapril (VASOTEC) 10 MG tablet TAKE 1 AND 1/2 TABLETS BY  MOUTH DAILY (Patient taking differently: Take 5-10 mg by mouth See admin instructions. Take 0.5 tablet (5 mg) by mouth in the morning & take 1 tablet (10 mg) by mouth at night.) 135 tablet 3  ? furosemide (LASIX) 20 MG tablet Take 1-2 tablets (20-40 mg total) by mouth daily. (Patient taking differently: Take 20 mg by mouth 2 (two) times daily.) 7 tablet 0  ? hydrochlorothiazide (MICROZIDE) 12.5 MG capsule Take 12.5 mg by mouth every evening.    ? levothyroxine (SYNTHROID) 50 MCG tablet TAKE 1 TABLET BY MOUTH  DAILY EXCEPT 2 TABLETS ON  SUNDAYS AND WEDNESDAYS (  TOTAL 9 TABS PER WEEK) 117 tablet 3  ? methocarbamol (ROBAXIN) 500 MG tablet TAKE 1 TABLET BY MOUTH EVERY 12 HOURS AS NEEDED  FOR MUSCLE SPASM 180 tablet 3  ? metoprolol tartrate (LOPRESSOR) 100 MG tablet TAKE ONE-HALF TABLET BY  MOUTH IN THE MORNING AND 1  TABLET BY MOUTH IN THE  EVENING 135 tablet 3  ? rivaroxaban (XARELTO) 2.5 MG

## 2021-06-07 NOTE — Progress Notes (Signed)
Reviewed, bradycardia. Stable from prior. Reviewed with pt in office.

## 2021-06-07 NOTE — Telephone Encounter (Signed)
Pt already has appt to see T Dugal FNP today at 12 noon. I spoke with pt and she does not think she needs to go to ED. Pt said last time had vertigo was 5 yrs ago. Pt said that dizziness where room spins started on 06/04/21 in the evening. Since then on and off pt has dizziness when up moving around. Pt said if sitting still pt does not have dizziness. Pt said has "zig zag shapes that move back and forth in front of eyes; last time had that symptom was last wk and pt has previously seen eye dr about that and has been told it is eye spasms. Pt said she does not have any weakness in extremities except for 6 - 7 years pt has had weakness in legs but that is not new and has not worsened. Pt plans on driving to office; I advised pt she should not drive if having dizziness on and off. Pt voiced understanding and will have her daughter bring her to the appt today at 12 noon. UC & ED precautions given and pt voiced understanding. Sending note to T Dugal FNP and Claiborne Billings CMA. ?

## 2021-06-07 NOTE — Assessment & Plan Note (Signed)
Rise slowly from lying or sitting position ?Increase water intake ? ?

## 2021-06-07 NOTE — Progress Notes (Signed)
Can we call pt to see if she can drop off urine sample? Only new finding is that kidneys are slightly decreased, work on water intake as well as we discussed.

## 2021-06-08 ENCOUNTER — Encounter: Payer: Self-pay | Admitting: Vascular Surgery

## 2021-06-08 ENCOUNTER — Ambulatory Visit (HOSPITAL_COMMUNITY)
Admission: RE | Admit: 2021-06-08 | Discharge: 2021-06-08 | Disposition: A | Discharge status: Home / Self Care | Payer: Medicare Other | Source: Ambulatory Visit | Attending: Vascular Surgery | Admitting: Vascular Surgery

## 2021-06-08 ENCOUNTER — Ambulatory Visit (INDEPENDENT_AMBULATORY_CARE_PROVIDER_SITE_OTHER): Payer: Medicare Other | Admitting: Vascular Surgery

## 2021-06-08 VITALS — BP 101/60 | HR 90 | Temp 98.0°F | Resp 20 | Ht 64.0 in | Wt 105.0 lb

## 2021-06-08 DIAGNOSIS — I739 Peripheral vascular disease, unspecified: Secondary | ICD-10-CM | POA: Diagnosis not present

## 2021-06-08 DIAGNOSIS — I7409 Other arterial embolism and thrombosis of abdominal aorta: Secondary | ICD-10-CM | POA: Diagnosis not present

## 2021-06-08 NOTE — Progress Notes (Signed)
[facility] [place] ? ?ASSESSMENT / PLAN: ?Jill Franco is a 87 y.o. female with atherosclerosis of native and stented aortoiliac occlusive disease as well as bilateral lower extremity arteries initially causing causing disabling claudication. Now status post left femoral endarterectomy and profundaplasty, complete endovascular reconstruction of aortic bifurcation (CERAB) 12/03/20. This required endovascular revision 04/30/21 for mechanical compression of the iliac stenting.  ? ?Recommend the following which can slow the progression of atherosclerosis and reduce the risk of major adverse cardiac / limb events:  ?Complete cessation from all tobacco products. ?Blood glucose control with goal A1c < 7%. ?Blood pressure control with goal blood pressure < 140/90 mmHg. ?Lipid reduction therapy with goal LDL-C <100 mg/dL (<70 if symptomatic from PAD).  ?Aspirin '81mg'$  PO QD.  ?Atorvastatin 40-'80mg'$  PO QD (or other "high intensity" statin therapy). ? ?Patient had early relief after endovascular revision, but unfortunately her symptoms have recurred.  I counseled her extensively about her options going forward.  I think further aortoiliac stenting is not likely to be successful given her diminutive arteries and early treatment failures.  ? ?Our options going forward include aortobifemoral bypass, (for which I do not think she is an excellent candidate), or extra-anatomic bypass such as axillobifemoral bypass.  I explained to her that we should avoid intervention as long as she can tolerate.  I will see her again in short interval follow-up.  Should her symptoms become disabling, or should she develop limb threatening ischemia, we will need to proceed with revascularization more urgently. ? ?CHIEF COMPLAINT: Calf pain with walking ? ?HISTORY OF PRESENT ILLNESS: ?Jill Franco is a 87 y.o. female who returns to clinic for surveillance of peripheral arterial disease.  She reports deteriorating  claudication symptoms.  She is now disabled by her symptoms.  She can only walk a short distance before severe pain sets in bilateral calves.  She has no wounds about her feet.  She occasionally has pain in her foot at rest, but does not report symptoms typical of ischemic rest pain.  She continues to smoke, but is working hard to quit.  She is using patches with some success. ? ?10/27/20: patient returns after evaluation by Dr. Einar Gip. Pending stress test and echo. No interval changes. ? ?01/11/21: Patient returns after hybrid revascularization.  She reports complete symptomatic relief of claudication.  She is walking as far as she likes.  She is appreciative.  ? ?04/20/21: Patient returns for follow-up evaluation.  She has had return of severe symptoms in her bilateral lower extremities, left worse than right.  She endorses early rest pain symptoms in her left foot.  She has short distance, disabling claudication in both calves. ? ?06/08/21: Patient returns to clinic.  After intervention, the patient had early relief of her symptoms, but they have now returned.  We had a long discussion about our options going forward.  She is maximally medically managed for peripheral arterial disease.  Her symptoms are not yet disabling to her. ? ?VASCULAR SURGICAL HISTORY:  ?03/18/16 -- left common iliac stent (6 x 40), terminal aortic stent (10 x 19)  (Fields) ? ?12/03/20 --  ?1) US guided right common femoral artery access ?2) Aortogram and pelvic angiogram ?3) Terminal aortic stenting (11x29m VBX post dilated to ~1107m ?4) Right common iliac artery stenting (7x597mBX post dilated to 9mm33moximally) ?5) Left common iliac artery stenting (7x59mm36m post dilated to 9mm p73mimally) ?6) left external iliac artery stenting (7x50mm v11mhn) ?7) left femoral endarterectomy,  profundaplasty, and patch angioplasty (bovine pericardium) ? ?04/30/21 -- ?Angioplasty of bilateral common iliac artery stents (7x4m [place]) ?Left external iliac  artery stenting (7x68mEluvia) ? ?VASCULAR RISK FACTORS: ?Positive history of stroke / transient ischemic attack. ?Negative history of coronary artery disease.  ?Negative history of diabetes mellitus. Last A1c 5.7. ?Positive history of smoking. Not actively smoking. ?Positive history of hypertension.  ?No reported history of chronic kidney disease but last GFR 58-73. CKD stage 2 - 3a. ?Negative history of chronic obstructive pulmonary disease, but patient has been treated with inhalers with symptomatic relief.  ? ?FUNCTIONAL STATUS: ?ECOG performance status: (1) Restricted in physically strenuous activity, ambulatory and able to do work of light nature ?Ambulatory status: Ambulatory within the community with limits ? ?Past Medical History:  ?Diagnosis Date  ? Cataract   ? bilaterally corrected.  ? COPD [facility] disease) [place]  ? [facility]  ? Pt was unaware and does not know when it happened.  Was told after some testing was done.  ? Double vision   ? Dyspnea   ? Facial droop 10/2017  ? left  ? History of shingles 2012  ? HLD (hyperlipidemia)   ? HTN (hypertension)   ? Hypothyroidism   ? PVD (peripheral vascular disease) ([facility]  ? Vision loss   ? temporary  ? ? ?Past Surgical History:  ?Procedure Laterality Date  ? ABDOMINAL AORTOGRAM N/A 04/30/2021  ? Procedure: ABDOMINAL AORTOGRAM;  Surgeon: HaCherre RobinsMD;  [facility];  Service: Cardiovascular;  Laterality: N/A;  ? ABDOMINAL EXPLORATION SURGERY  1950s  ? "they thought I was pregnant; went in to explore"  ? APPENDECTOMY  childhood  ? BACK SURGERY    ? BREAST LUMPECTOMY Left 1960's  ? benign  ? carotid ultrasound  06/19/2007  ? 0-39% stenosis  ? CATARACT EXTRACTION W/ INTRAOCULAR LENS IMPLANT Left 09/2016  ? CATARACT EXTRACTION W/ INTRAOCULAR LENS IMPLANT Right 10/2016  ? COLONOSCOPY WITH PROPOFOL N/A 04/08/2019  ? Procedure: COLONOSCOPY WITH PROPOFOL;  Surgeon: JaMilus BanisterMD;   [facility];  Service: Endoscopy;  Laterality: N/A;  ? DILATION AND CURETTAGE OF UTERUS  1960's X 3  ? miscarriages  ? DOPPLER ECHOCARDIOGRAPHY  06/19/2007  ? Normal EF 55-70%  ? ENDARTERECTOMY FEMORAL Left 12/03/2020  ? Procedure: LEFT FEMORAL ENDARTERECTOMY AND PROFUNDAPLASTY;  Surgeon: HaCherre RobinsMD;  [facility] Service: Vascular;  Laterality: Left;  ? ILIAC ARTERY STENT Left 03/18/2016  ? common iliac stent (6 x 40), aortic stent (10 x 19)/notes 03/18/2016  ? INSERTION OF ILIAC STENT Left 12/03/2020  ? Procedure: AORTIC STENTING, INSERTION OF BILATERAL COMMON ILIAC STENTS, AND LEFT EXTERNAL ILIAC STENT;  Surgeon: HaCherre RobinsMD;  [facility];  Service: Vascular;  Laterality: Left;  ? LAMINECTOMY AND MICRODISCECTOMY SPINE  1960's X 2  ? LOWER EXTREMITY ANGIOGRAM N/A 12/03/2020  ? Procedure: ABAndris Baumann Surgeon: HaCherre RobinsMD;  [facility]: V[facility];  Laterality: N/A;  ? MOLE REMOVAL  07/10/2016  ? NSVD    ? x1  ? PATCH ANGIOPLASTY Left 12/03/2020  ? Procedure: PATCH ANGIOPLASTY OF LEFT COMMON FEMORAL ARTERY USING BOVINE PERICARDIUM PATCH;  Surgeon: HaCherre RobinsMD;  [facility]: V[facility];  Laterality: Left;  ? PERIPHERAL VASCULAR BALLOON ANGIOPLASTY Right 04/30/2021  ? Procedure: PERIPHERAL VASCULAR BALLOON ANGIOPLASTY;  Surgeon: HaCherre RobinsMD;  [facility];  Service: Cardiovascular;  Laterality: Right;  rt common iliac  ? PERIPHERAL VASCULAR CATHETERIZATION N/A 03/18/2016  ? Procedure: Abdominal Aortogram w/Lower Extremity;  Surgeon: Elam Dutch, MD;  Location: [place] CV LAB;  Service: Cardiovascular;  Laterality: N/A;  ? PERIPHERAL VASCULAR CATHETERIZATION  03/18/2016  ? Procedure: Peripheral Vascular Intervention;  Surgeon: Elam Dutch, MD;  Location: [place] CV LAB;  Service: Cardiovascular;;  ? PERIPHERAL VASCULAR INTERVENTION Left 04/30/2021  ? Procedure: PERIPHERAL VASCULAR  INTERVENTION;  Surgeon: [name], MD;  Location: [place] CV LAB;  Service: Cardiovascular;  Laterality: Left;  lt common iliac  ? POLYPECTOMY  04/08/2019  ? Procedure: POLYPECTOMY;  Surgeon: Milus Banister, MD;

## 2021-06-08 NOTE — Telephone Encounter (Signed)
Seen pt on 4/17 and spoke to [facility] about dizziness. ?

## 2021-06-09 ENCOUNTER — Encounter: Payer: Self-pay | Admitting: Family

## 2021-06-09 ENCOUNTER — Encounter: Payer: Self-pay | Admitting: Student

## 2021-06-09 ENCOUNTER — Ambulatory Visit: Payer: Medicare Other | Admitting: Student

## 2021-06-09 ENCOUNTER — Ambulatory Visit (INDEPENDENT_AMBULATORY_CARE_PROVIDER_SITE_OTHER): Payer: Medicare Other | Admitting: Family

## 2021-06-09 ENCOUNTER — Ambulatory Visit (INDEPENDENT_AMBULATORY_CARE_PROVIDER_SITE_OTHER)
Admission: RE | Admit: 2021-06-09 | Discharge: 2021-06-09 | Disposition: A | Discharge status: Home / Self Care | Payer: Medicare Other | Source: Ambulatory Visit | Attending: Family | Admitting: Family

## 2021-06-09 VITALS — BP 112/60 | HR 60 | Temp 98.0°F | Resp 16 | Ht 64.0 in | Wt 107.1 lb

## 2021-06-09 VITALS — BP 90/54 | HR 72 | Temp 97.7°F | Resp 17 | Ht 64.0 in | Wt 108.0 lb

## 2021-06-09 DIAGNOSIS — R944 Abnormal results of kidney function studies: Secondary | ICD-10-CM | POA: Diagnosis not present

## 2021-06-09 DIAGNOSIS — R3915 Urgency of urination: Secondary | ICD-10-CM | POA: Insufficient documentation

## 2021-06-09 DIAGNOSIS — R42 Dizziness and giddiness: Secondary | ICD-10-CM

## 2021-06-09 DIAGNOSIS — R062 Wheezing: Secondary | ICD-10-CM | POA: Diagnosis not present

## 2021-06-09 DIAGNOSIS — R911 Solitary pulmonary nodule: Secondary | ICD-10-CM | POA: Diagnosis not present

## 2021-06-09 DIAGNOSIS — R0602 Shortness of breath: Secondary | ICD-10-CM | POA: Diagnosis not present

## 2021-06-09 DIAGNOSIS — I951 Orthostatic hypotension: Secondary | ICD-10-CM | POA: Diagnosis not present

## 2021-06-09 DIAGNOSIS — R059 Cough, unspecified: Secondary | ICD-10-CM | POA: Diagnosis not present

## 2021-06-09 DIAGNOSIS — R0609 Other forms of dyspnea: Secondary | ICD-10-CM | POA: Diagnosis not present

## 2021-06-09 DIAGNOSIS — R799 Abnormal finding of blood chemistry, unspecified: Secondary | ICD-10-CM | POA: Insufficient documentation

## 2021-06-09 DIAGNOSIS — R0600 Dyspnea, unspecified: Secondary | ICD-10-CM

## 2021-06-09 LAB — POCT URINALYSIS DIP (CLINITEK)
Bilirubin, UA: NEGATIVE
Blood, UA: NEGATIVE
Glucose, UA: NEGATIVE mg/dL
Ketones, POC UA: NEGATIVE mg/dL
Leukocytes, UA: NEGATIVE
Nitrite, UA: NEGATIVE
Spec Grav, UA: 1.01 (ref 1.010–1.025)
Urobilinogen, UA: 0.2 E.U./dL
pH, UA: 7 (ref 5.0–8.0)

## 2021-06-09 LAB — BRAIN NATRIURETIC PEPTIDE: Pro B Natriuretic peptide (BNP): 164 pg/mL — ABNORMAL HIGH (ref 0.0–100.0)

## 2021-06-09 MED ORDER — METOPROLOL TARTRATE 50 MG PO TABS: ORAL_TABLET | ORAL | 3 refills | Status: DC

## 2021-06-09 MED ORDER — FUROSEMIDE 20 MG PO TABS: 20.0000 mg | ORAL_TABLET | Freq: Every day | ORAL | Status: DC | PRN

## 2021-06-09 NOTE — Assessment & Plan Note (Addendum)
Wheezing left lobes, upper and lower,  ?Ordering cxr pending results.  ?R/o pneumonia ?Reviewed prior bnp, only slight elevation. Repeat bnp today  ?

## 2021-06-09 NOTE — Assessment & Plan Note (Signed)
Lightheaded upon rising. Slight improvement with d/c of diuretics. ?Resume lasix 20 mg only once daily, temporarily d/c hctz for now due to lower blood pressure. ?Continue with increased water intake to stay hydrated.  ?Rise slowly upon standing or sitting up.  ?I have reached out to cardiology that pt seeing, they are going to call her to try to get her in this week as well for cardiac eval/ last ekg stable 4/17 with bradycardia, no changes.  ?

## 2021-06-09 NOTE — Progress Notes (Signed)
? ?Established Patient Office Visit ? ?Subjective:  ?Patient ID: Jill Franco, female    DOB: 23-Sep-1934  Age: 87 y.o. MRN: [id number] ? ?CC:  ?Chief Complaint  ?Patient presents with  ? Hypertension  ? ? ?HPI ?Jill Franco is here today for follow up.  ?Some improvement in dizziness in the last two days. She has d/c her HCTZ as well as the furosemide as well. Has not yet taken it this am.  ? ?Did bring urine sample with her this am, just fresh prior to coming in.  ?She does note that she has some urgency and pressure, new in the last few days. She has had it before in the past but then had went away. No dysuria. No urinary frequency.  ? ?Still with lower blood pressure today on exam, 112/60.  ?She does see a cardiologist.  ? ?Past Medical History:  ?Diagnosis Date  ? Cataract   ? bilaterally corrected.  ? COPD (chronic obstructive pulmonary disease) ([place])   ? CVA (cerebral vascular accident) [facility])   ? Pt was unaware and does not know when it happened.  Was told after some testing was done.  ? Double vision   ? Dyspnea   ? Facial droop 10/2017  ? left  ? History of shingles 2012  ? HLD (hyperlipidemia)   ? HTN (hypertension)   ? Hypothyroidism   ? PVD (peripheral vascular disease) ([place])   ? Vision loss   ? temporary  ? ? ?Past Surgical History:  ?Procedure Laterality Date  ? ABDOMINAL AORTOGRAM N/A 04/30/2021  ? Procedure: ABDOMINAL AORTOGRAM;  Surgeon: [name], MD;  Location: [place] CV LAB;  Service: Cardiovascular;  Laterality: N/A;  ? ABDOMINAL EXPLORATION SURGERY  1950s  ? "they thought I was pregnant; went in to explore"  ? APPENDECTOMY  childhood  ? BACK SURGERY    ? BREAST LUMPECTOMY Left 1960's  ? benign  ? carotid ultrasound  06/19/2007  ? 0-39% stenosis  ? CATARACT EXTRACTION W/ INTRAOCULAR LENS IMPLANT Left 09/2016  ? CATARACT EXTRACTION W/ INTRAOCULAR LENS IMPLANT Right 10/2016  ? COLONOSCOPY WITH PROPOFOL N/A 04/08/2019  ? Procedure: COLONOSCOPY WITH PROPOFOL;  Surgeon: Milus Banister, MD;  [facility];  Service: Endoscopy;  Laterality: N/A;  ? DILATION AND CURETTAGE OF UTERUS  1960's X 3  ? miscarriages  ? DOPPLER ECHOCARDIOGRAPHY  06/19/2007  ? Normal EF 55-70%  ? ENDARTERECTOMY FEMORAL Left 12/03/2020  ? Procedure: LEFT FEMORAL ENDARTERECTOMY AND PROFUNDAPLASTY;  Surgeon: [name], MD;  Location: [place];  Service: Vascular;  Laterality: Left;  ? ILIAC ARTERY STENT Left 03/18/2016  ? common iliac stent (6 x 40), aortic stent (10 x 19)/notes 03/18/2016  ? INSERTION OF ILIAC STENT Left 12/03/2020  ? Procedure: AORTIC STENTING, INSERTION OF BILATERAL COMMON ILIAC STENTS, AND LEFT EXTERNAL ILIAC STENT;  Surgeon: [name], MD;  [facility];  Service: Vascular;  Laterality: Left;  ? LAMINECTOMY AND MICRODISCECTOMY SPINE  1960's X 2  ? LOWER EXTREMITY ANGIOGRAM N/A 12/03/2020  ? Procedure: Andris Baumann;  Surgeon: [name], MD;  Location: [place];  Service: Vascular;  Laterality: N/A;  ? MOLE REMOVAL  07/10/2016  ? NSVD    ? x1  ? PATCH ANGIOPLASTY Left 12/03/2020  ? Procedure: PATCH ANGIOPLASTY OF LEFT COMMON FEMORAL ARTERY USING BOVINE PERICARDIUM PATCH;  Surgeon: [name], MD;  Location: [place];  Service: Vascular;  Laterality: Left;  ? PERIPHERAL VASCULAR BALLOON ANGIOPLASTY  Right 04/30/2021  ? Procedure: PERIPHERAL VASCULAR BALLOON ANGIOPLASTY;  Surgeon: [name], MD;  Location: [place] CV LAB;  Service: Cardiovascular;  Laterality: Right;  rt common iliac  ? PERIPHERAL VASCULAR CATHETERIZATION N/A 03/18/2016  ? Procedure: Abdominal Aortogram w/Lower Extremity;  Surgeon: Elam Dutch, MD;  Location: [place] CV LAB;  Service: Cardiovascular;  Laterality: N/A;  ? PERIPHERAL VASCULAR CATHETERIZATION  03/18/2016  ? Procedure: Peripheral Vascular Intervention;  Surgeon: Elam Dutch, MD;  Location: [place] CV LAB;  Service: Cardiovascular;;  ? PERIPHERAL VASCULAR INTERVENTION Left 04/30/2021  ? Procedure: PERIPHERAL VASCULAR  INTERVENTION;  Surgeon: [name], MD;  Location: [place] CV LAB;  Service: Cardiovascular;  Laterality: Left;  lt common iliac  ? POLYPECTOMY  04/08/2019  ? Procedure: POLYPECTOMY;  Surgeon: Milus Banister, MD;  Location[facility];  Service: Endoscopy;;  ? ULTRASOUND GUIDANCE FOR VASCULAR ACCESS Right 12/03/2020  ? Procedure: ULTRASOUND GUIDANCE FOR VASCULAR ACCESS, RIGHT FEMORAL ARTERY;  Surgeon: [name], MD;  Location: [place];  Service: Vascular;  Laterality: Right;  ? ? ?Family History  ?Problem Relation Age of Onset  ? Stroke Father 73  ? Cancer Brother   ? Colon cancer Neg Hx   ? Breast cancer Neg Hx   ? Colon polyps Neg Hx   ? Esophageal cancer Neg Hx   ? Rectal cancer Neg Hx   ? Stomach cancer Neg Hx   ? ? ?Social History  ? ?Socioeconomic History  ? Marital status: Widowed  ?  Spouse name: Not on file  ? Number of children: 1  ? Years of education: Not on file  ? Highest education level: Not on file  ?Occupational History  ? 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  ?  Comment: Glass blower/designer  ?Tobacco Use  ? Smoking status: Former  ?  Packs/day: 0.25  ?  Years: 69.00  ?  Pack years: 17.25  ?  Types: Cigarettes  ?  Quit date: 01/2021  ?  Years since quitting: 0.3  ?  Passive exposure: Current  ? Smokeless tobacco: Never  ?Vaping Use  ? Vaping Use: Never used  ?Substance and Sexual Activity  ? Alcohol use: No  ?  Alcohol/week: 0.0 standard drinks  ? Drug use: No  ? Sexual activity: Not on file  ?Other Topics Concern  ? Not on file  ?Social History Narrative  ? From [employer]  ? Retired 2007  ? Widowed 2008 after 49 years  ? Enjoys yard work, but needs some help with yard work now.    ? Her daughter is helping at home some (some as of 2022).  Her daughter has vision loss and is widowed as of 2022 and moved in with patient.  ? ?Social Determinants of Health  ? ?Financial Resource Strain: Medium Risk  ? Difficulty of Paying Living Expenses: Somewhat hard  ?Food Insecurity: No Food Insecurity  ?  Worried About Charity fundraiser in the Last Year: Never true  ? Ran Out of Food in the Last Year: Never true  ?Transportation Needs: Not on file  ?Physical Activity: Not on file  ?Stress: Not on file  ?Social Connections: Not on file  ?Intimate Partner Violence: Not on file  ? ? ?Outpatient Medications Prior to Visit  ?Medication Sig Dispense Refill  ? albuterol (PROAIR HFA) 108 (90 Base) MCG/ACT inhaler Inhale 2 puffs into the lungs every 6 (six) hours as needed for wheezing or shortness of breath. 18 g 5  ?  alendronate (FOSAMAX) 70 MG tablet Take 1 tablet (70 mg total) by mouth every 7 (seven) days. Take with a full glass of water on an empty stomach. 13 tablet 3  ? aspirin EC 81 MG tablet Take 1 tablet (81 mg total) by mouth daily. Swallow whole. 90 tablet 3  ? budesonide-formoterol (SYMBICORT) 160-4.5 MCG/ACT inhaler Inhale 2 puffs into the lungs in the morning and at bedtime. 30.6 g 3  ? Cholecalciferol (VITAMIN D3) 25 MCG (1000 UT) CAPS Take 1 capsule (1,000 Units total) by mouth daily.    ? diphenhydrAMINE (BENADRYL) 25 MG tablet Take 25 mg by mouth at bedtime as needed for sleep.    ? enalapril (VASOTEC) 10 MG tablet TAKE 1 AND 1/2 TABLETS BY  MOUTH DAILY (Patient taking differently: Take 5-10 mg by mouth See admin instructions. Take 0.5 tablet (5 mg) by mouth in the morning & take 1 tablet (10 mg) by mouth at night.) 135 tablet 3  ? furosemide (LASIX) 20 MG tablet Take 1-2 tablets (20-40 mg total) by mouth daily. (Patient taking differently: Take 20 mg by mouth 2 (two) times daily.) 7 tablet 0  ? hydrochlorothiazide (MICROZIDE) 12.5 MG capsule Take 12.5 mg by mouth every evening.    ? levothyroxine (SYNTHROID) 50 MCG tablet TAKE 1 TABLET BY MOUTH  DAILY EXCEPT 2 TABLETS ON  SUNDAYS AND WEDNESDAYS (  TOTAL 9 TABS PER WEEK) 117 tablet 3  ? methocarbamol (ROBAXIN) 500 MG tablet TAKE 1 TABLET BY MOUTH EVERY 12 HOURS AS NEEDED FOR MUSCLE SPASM 180 tablet 3  ? metoprolol tartrate (LOPRESSOR) 100 MG tablet TAKE  ONE-HALF TABLET BY  MOUTH IN THE MORNING AND 1  TABLET BY MOUTH IN THE  EVENING 135 tablet 3  ? rivaroxaban (XARELTO) 2.5 MG TABS tablet Take 1 tablet (2.5 mg total) by mouth 2 (two) times daily. 60 tabl

## 2021-06-09 NOTE — Assessment & Plan Note (Signed)
Still with slight symptoms but improved.  ?Reviewed cmp, lower egfr ordering urine today , pending results.  ?Reviewed cbc no anemia.  ?Pt with improvement d/c of hctz and furosemide, still with lower blood pressure.  ? ?

## 2021-06-09 NOTE — Progress Notes (Signed)
? ?Primary Physician/Referring:  Tonia Ghent, MD ? ?Patient ID: Jill Franco, female    DOB: August 26, 1934, 87 y.o.   MRN: [id number] ? ?Chief Complaint  ?Patient presents with  ? Follow-up  ? Dizziness  ? ?HPI:   ? ?Jill Franco  is a 87 y.o. Caucasian female with history of hypertension, hyperlipidemia, PAD, tobacco use (smokes about half pack per day), patient reported history of CVA (unclear when), COPD, and hypothyroidism.  Denies history of diabetes, MI. Referred to our office for preoperative risk stratification by Dr. Fuller Song (vascular surgery) as patient has planned upcoming left femoral endarterectomy with retrograde iliac stenting.  ? ?Patient presents for urgent visit at the request of her PCPs office given hypotension and dizziness ongoing for the last week.  Patient states she is otherwise fairly asymptomatic.  Patient's PCP stopped hydrochlorothiazide and Lasix 3 days ago, however dizziness has continued. ? ?Notably patient reports she quit smoking in December 2022. ? ?Past Medical History:  ?Diagnosis Date  ? Cataract   ? bilaterally corrected.  ? COPD (chronic obstructive pulmonary disease) ([place])   ? CVA (cerebral vascular accident) [facility])   ? Pt was unaware and does not know when it happened.  Was told after some testing was done.  ? Double vision   ? Dyspnea   ? Facial droop 10/2017  ? left  ? History of shingles 2012  ? HLD (hyperlipidemia)   ? HTN (hypertension)   ? Hypothyroidism   ? PVD (peripheral vascular disease) ([place])   ? Vision loss   ? temporary  ? ?Past Surgical History:  ?Procedure Laterality Date  ? ABDOMINAL AORTOGRAM N/A 04/30/2021  ? Procedure: ABDOMINAL AORTOGRAM;  Surgeon: [name], MD;  Location: [place] [facility];  Service: Cardiovascular;  Laterality: N/A;  ? ABDOMINAL EXPLORATION SURGERY  1950s  ? "they thought I was pregnant; went in to explore"  ? APPENDECTOMY  childhood  ? BACK SURGERY    ? BREAST LUMPECTOMY Left 1960's  ? benign  ? carotid  ultrasound  06/19/2007  ? 0-39% stenosis  ? CATARACT EXTRACTION W/ INTRAOCULAR LENS IMPLANT Left 09/2016  ? CATARACT EXTRACTION W/ INTRAOCULAR LENS IMPLANT Right 10/2016  ? COLONOSCOPY WITH PROPOFOL N/A 04/08/2019  ? Procedure: COLONOSCOPY WITH PROPOFOL;  Surgeon: Milus Banister, MD;  Location: [facility];  Service: Endoscopy;  Laterality: N/A;  ? DILATION AND CURETTAGE OF UTERUS  1960's X 3  ? miscarriages  ? DOPPLER ECHOCARDIOGRAPHY  06/19/2007  ? Normal EF 55-70%  ? ENDARTERECTOMY FEMORAL Left 12/03/2020  ? Procedure: LEFT FEMORAL ENDARTERECTOMY AND PROFUNDAPLASTY;  Surgeon: [name], MD;  Location: [place];  Service: Vascular;  Laterality: Left;  ? ILIAC ARTERY STENT Left 03/18/2016  ? common iliac stent (6 x 40), aortic stent (10 x 19)/notes 03/18/2016  ? INSERTION OF ILIAC STENT Left 12/03/2020  ? Procedure: AORTIC STENTING, INSERTION OF BILATERAL COMMON ILIAC STENTS, AND LEFT EXTERNAL ILIAC STENT;  Surgeon: [name], MD;  Location: [facility];  Service: Vascular;  Laterality: Left;  ? LAMINECTOMY AND MICRODISCECTOMY SPINE  1960's X 2  ? LOWER EXTREMITY ANGIOGRAM N/A 12/03/2020  ? Procedure: Andris Baumann;  Surgeon: [name], MD;  Location: [place];  Service: Vascular;  Laterality: N/A;  ? MOLE REMOVAL  07/10/2016  ? NSVD    ? x1  ? PATCH ANGIOPLASTY Left 12/03/2020  ? Procedure: PATCH ANGIOPLASTY OF LEFT COMMON FEMORAL ARTERY USING BOVINE PERICARDIUM PATCH;  Surgeon: [name],  MD;  Location: [place];  Service: Vascular;  Laterality: Left;  ? PERIPHERAL VASCULAR BALLOON ANGIOPLASTY Right 04/30/2021  ? Procedure: PERIPHERAL VASCULAR BALLOON ANGIOPLASTY;  Surgeon: [name], MD;  Location: [place] [facility];  Service: Cardiovascular;  Laterality: Right;  rt common iliac  ? PERIPHERAL VASCULAR CATHETERIZATION N/A 03/18/2016  ? Procedure: Abdominal Aortogram w/Lower Extremity;  Surgeon: Elam Dutch, MD;  Location: [facility];  Service: Cardiovascular;  Laterality:  N/A;  ? PERIPHERAL VASCULAR CATHETERIZATION  03/18/2016  ? Procedure: Peripheral Vascular Intervention;  Surgeon: Elam Dutch, MD;  Location: [facility];  Service: Cardiovascular;;  ? PERIPHERAL VASCULAR INTERVENTION Left 04/30/2021  ? Procedure: PERIPHERAL VASCULAR INTERVENTION;  Surgeon: [name], MD;  Location: [place];  Service: Cardiovascular;  Laterality: Left;  lt common iliac  ? POLYPECTOMY  04/08/2019  ? Procedure: POLYPECTOMY;  Surgeon: Milus Banister, MD;  Location: [facility];  Service: Endoscopy;;  ? ULTRASOUND GUIDANCE FOR VASCULAR ACCESS Right 12/03/2020  ? Procedure: ULTRASOUND GUIDANCE FOR VASCULAR ACCESS, RIGHT FEMORAL ARTERY;  Surgeon: [name], MD;  Location: [place];  Service: Vascular;  Laterality: Right;  ? ?Family History  ?Problem Relation Age of Onset  ? Stroke Father 10  ? Cancer Brother   ? Colon cancer Neg Hx   ? Breast cancer Neg Hx   ? Colon polyps Neg Hx   ? Esophageal cancer Neg Hx   ? Rectal cancer Neg Hx   ? Stomach cancer Neg Hx   ?  ?Social History  ? ?Tobacco Use  ? Smoking status: Former  ?  Packs/day: 0.25  ?  Years: 69.00  ?  Pack years: 17.25  ?  Types: Cigarettes  ?  Quit date: 01/2021  ?  Years since quitting: 0.3  ?  Passive exposure: Current  ? Smokeless tobacco: Never  ?Substance Use Topics  ? Alcohol use: No  ?  Alcohol/week: 0.0 standard drinks  ? ?Marital Status: Widowed  ? ?ROS  ?Review of Systems  ?Cardiovascular:  Positive for claudication and dyspnea on exertion (stable). Negative for chest pain, leg swelling (resolved), near-syncope, orthopnea, palpitations, paroxysmal nocturnal dyspnea and syncope.  ? ?Objective  ?Blood pressure (!) 90/54, pulse 72, temperature 97.7 ?F (36.5 ?C), resp. rate 17, height '5\' 4"'$  (1.626 m), weight 108 lb (49 kg), SpO2 96 %.  ? ?  06/09/2021  ? 12:46 PM 06/09/2021  ?  9:25 AM 06/08/2021  ?  9:46 AM  ?Vitals with BMI  ?Height '5\' 4"'$  '5\' 4"'$  '5\' 4"'$   ?Weight 108 lbs 107 lbs 2 oz 105 lbs  ?BMI 18.53 18.38  18.01  ?Systolic 90 161 096  ?Diastolic 54 60 60  ?Pulse 72 60 90  ?  ?Orthostatic VS for the past 72 hrs (Last 3 readings): ? Orthostatic BP Patient Position BP Location Cuff Size Orthostatic Pulse  ?06/09/21 1257 (!) 77/64 Standing Left Arm Normal 74  ?06/09/21 1256 106/43 Sitting Left Arm Normal 67  ?06/09/21 1255 103/43 Supine Left Arm Normal (!) 47  ? ? ? Physical Exam ?Vitals reviewed.  ?Constitutional:   ?   Comments: Frail  ?Cardiovascular:  ?   Rate and Rhythm: Normal rate and regular rhythm.  ?   Pulses: Intact distal pulses.     ?     Dorsalis pedis pulses are 0 on the right side and 0 on the left side.  ?     Posterior tibial pulses are 0 on  the right side and 0 on the left side.  ?   Heart sounds: S1 normal and S2 normal. No murmur heard. ?  No gallop.  ?   Comments: No evidence of open wounds or ulcers. Right foot cooler to touch than left.  ?Pulmonary:  ?   Effort: Pulmonary effort is normal.  ?   Breath sounds: Decreased air movement (bilaterally throughout) present.  ?Musculoskeletal:  ?   Right lower leg: No edema.  ?   Left lower leg: No edema.  ?Skin: ?   Capillary Refill: Capillary refill takes 2 to 3 seconds.  ?Neurological:  ?   Mental Status: She is alert.  ?Physical exam unchanged compared to previous office visit. ? ?Laboratory examination:  ? ?Recent Labs  ?  12/03/20 ?1225 12/04/20 ?0320 02/06/21 ?0609 02/09/21 ?1244 03/25/21 ?1252 04/06/21 ?0000 04/30/21 ?5885 06/07/21 ?1308  ?NA  --  134* 138   < > 137 143 139 137  ?K  --  3.4* 4.8   < > 3.7 4.8 4.3 3.6  ?CL  --  101 100   < > 99 102 101 95*  ?CO2  --  27 28   < > 33* 24  --  34*  ?GLUCOSE  --  154* 126*   < > 123* 88 83 89  ?BUN  --  14 21   < > '22 23 23 '$ 38*  ?CREATININE 0.75 0.87 0.97   < > 0.82 0.92 0.80 1.18  ?CALCIUM  --  8.4* 9.3   < > 9.3 9.5  --  9.5  ?GFRNONAA >60 >60 58*  --   --   --   --   --   ? < > = values in this interval not displayed.  ? ?estimated creatinine clearance is 27 mL/min (by C-G formula based on SCr of 1.18  mg/dL).  ? ?  Latest Ref Rng & Units 06/07/2021  ?  1:08 PM 04/30/2021  ?  7:43 AM 04/06/2021  ? 12:00 AM  ?CMP  ?Glucose 70 - 99 mg/dL 89   83   88    ?BUN 6 - 23 mg/dL 38   23   23    ?Creatinine 0.40 - 1.20 mg/dL

## 2021-06-09 NOTE — Assessment & Plan Note (Signed)
Ordering urine culture pending results  

## 2021-06-09 NOTE — Assessment & Plan Note (Signed)
Order urine culture and poct urine,  ?Pending results ?

## 2021-06-09 NOTE — Progress Notes (Signed)
Wnl, pending urine culure

## 2021-06-09 NOTE — Patient Instructions (Addendum)
Increase water intake.  ?The cardiologist office will be giving you a call to try to fit you in this week, please have phone available.  ?Temporarily stop the HCTZ until seen by cardiologist.  ?Resume furosemide (lasix) 20 mg but only one tablet once daily.  ? ?Stop by the lab prior to leaving today. I will notify you of your results once received.  ?Complete xray(s) prior to leaving today. I will notify you of your results once received. ? ?Due to recent changes in healthcare laws, you may see results of your imaging and/or laboratory studies on MyChart before I have had a chance to review them.  I understand that in some cases there may be results that are confusing or concerning to you. Please understand that not all results are received at the same time and often I may need to interpret multiple results in order to provide you with the best plan of care or course of treatment. Therefore, I ask that you please give me 2 business days to thoroughly review all your results before contacting my office for clarification. Should we see a critical lab result, you will be contacted sooner.  ? ?It was a pleasure seeing you today! Please do not hesitate to reach out with any questions and or concerns. ? ?Regards,  ? ?Kelcie Currie ?FNP-C ? ?

## 2021-06-09 NOTE — Progress Notes (Signed)
No acute findings seen on the chest x-ray which is good.  There is still left lower lobe nodule which was also there on the CT of November 2022.  There is hyperinflation but we already know that the patient has COPD so this is expected.

## 2021-06-10 LAB — URINE CULTURE
MICRO NUMBER:: 13284577
SPECIMEN QUALITY:: ADEQUATE

## 2021-06-10 NOTE — Progress Notes (Signed)
Bnp is stable.

## 2021-06-11 ENCOUNTER — Other Ambulatory Visit: Payer: Self-pay | Admitting: *Deleted

## 2021-06-11 DIAGNOSIS — I739 Peripheral vascular disease, unspecified: Secondary | ICD-10-CM

## 2021-06-11 DIAGNOSIS — I7409 Other arterial embolism and thrombosis of abdominal aorta: Secondary | ICD-10-CM

## 2021-06-11 NOTE — Progress Notes (Signed)
Urine culture is negative for infection.

## 2021-06-11 NOTE — Progress Notes (Signed)
Called pt to inform her about her lab results. Pt understood

## 2021-06-24 ENCOUNTER — Encounter: Payer: Self-pay | Admitting: Family Medicine

## 2021-06-24 ENCOUNTER — Ambulatory Visit (INDEPENDENT_AMBULATORY_CARE_PROVIDER_SITE_OTHER): Payer: Medicare Other | Admitting: Family Medicine

## 2021-06-24 DIAGNOSIS — R0600 Dyspnea, unspecified: Secondary | ICD-10-CM | POA: Diagnosis not present

## 2021-06-24 DIAGNOSIS — R609 Edema, unspecified: Secondary | ICD-10-CM

## 2021-06-24 MED ORDER — FUROSEMIDE 20 MG PO TABS: 40.0000 mg | ORAL_TABLET | Freq: Every morning | ORAL | Status: DC

## 2021-06-24 NOTE — Patient Instructions (Signed)
Try taking 2 furosemide tabs (total of '40mg'$ ) in the AM.  See if your weight comes down and the swelling gets better.  Let me know about your BP and weight in a few days.   ? ?I would still take metoprolol if your top BP number is above 120 but you may not need it with the change in furosemide.   ? ?We may need to change your furosemide dosing as we go along.  ? ?Take care.  Glad to see you. ?

## 2021-06-24 NOTE — Progress Notes (Signed)
She had been taking metoprolol twice daily most days.  She takes it only if SBP is above 120.   ? ?Lightheadedness is better in the meantime, off HCTZ.  She is taking lasix '20mg'$  BID but she doesn't see sig inc in UOP with use.  Weight is up in the meantime.  More BLE edema.  ? ?Meds, vitals, and allergies reviewed.  ? ?ROS: Per HPI unless specifically indicated in ROS section  ? ?GEN: nad, alert and oriented ?HEENT:ncat ?NECK: supple w/o LA ?CV: rrr.  ?PULM: ctab, no inc wob ?ABD: soft, +bs ?EXT: 1+ BLE edema at the feet and ankles. ?SKIN: no acute rash ? ?

## 2021-06-27 NOTE — Assessment & Plan Note (Signed)
Discussed options.  ? ?Advised to try taking 2 furosemide tabs (total of '40mg'$ ) in the AM.  She can see if her weight comes down and the swelling gets better.  She will let me know about her BP and weight in a few days.   ? ?I would still take metoprolol if SBP is above 120 but she may not need it with the change in furosemide.   ? ?We may need to change her furosemide dosing as we go along.  ?

## 2021-06-29 ENCOUNTER — Telehealth: Payer: Self-pay | Admitting: Family Medicine

## 2021-06-29 ENCOUNTER — Other Ambulatory Visit: Payer: Self-pay

## 2021-06-29 ENCOUNTER — Other Ambulatory Visit: Payer: Self-pay | Admitting: Family Medicine

## 2021-06-29 DIAGNOSIS — R0600 Dyspnea, unspecified: Secondary | ICD-10-CM

## 2021-06-29 NOTE — Telephone Encounter (Signed)
Pt called and wants to speak with the nurse about the medication below, says it is not covered by her insurance. Pt wants to know if "there is something else that insurance would cover." methocarbamol (ROBAXIN) 500 MG tablet ? ?Callback Number: [id number] ?

## 2021-06-29 NOTE — Telephone Encounter (Signed)
Home Health Verbal Orders ?Caller Name: Hazle Nordmann ?Agency Name: [facility] ? ?Callback number: [phone] ? ?Requesting:  Nurse Orders ? ?Reason: Wound Care  ? ?Frequency: Twice for 1 week, once a week for 4 weeks ? ?Please forward to [facility] pool or providers CMA  ?

## 2021-06-30 MED ORDER — TIZANIDINE HCL 4 MG PO TABS: 2.0000 mg | ORAL_TABLET | Freq: Two times a day (BID) | ORAL | 1 refills | Status: DC | PRN

## 2021-06-30 NOTE — Telephone Encounter (Signed)
Please give the order.  Thanks.   

## 2021-06-30 NOTE — Addendum Note (Signed)
Addended by: Tonia Ghent on: 06/30/2021 11:41 AM ? ? Modules accepted: Orders ? ?

## 2021-06-30 NOTE — Telephone Encounter (Signed)
Could try tizanidine with sedation caution.  Rx sent.  Thanks.  ?

## 2021-06-30 NOTE — Telephone Encounter (Signed)
Called and left message with verbal orders on secure VM. ?

## 2021-06-30 NOTE — Telephone Encounter (Signed)
Called and spoke with patient about medication changes. Patient is okay with that and [facility] has already called her to let her know they filled the rx.  ?

## 2021-07-01 ENCOUNTER — Telehealth: Payer: Self-pay | Admitting: Family Medicine

## 2021-07-01 ENCOUNTER — Encounter: Payer: Self-pay | Admitting: Family Medicine

## 2021-07-01 MED ORDER — ROSUVASTATIN CALCIUM 20 MG PO TABS: 20.0000 mg | ORAL_TABLET | Freq: Every day | ORAL | 3 refills | Status: DC

## 2021-07-01 NOTE — Telephone Encounter (Signed)
Erx sent

## 2021-07-01 NOTE — Telephone Encounter (Signed)
?  Encourage patient to contact the pharmacy for refills or they can request refills through [facility] ? ?LAST APPOINTMENT DATE:  Please schedule appointment if longer than 1 year ? ?NEXT APPOINTMENT DATE: ? ?MEDICATION:rosuvastatin ? ?Is the patient out of medication?  ? ?PHARMACY: ? ?Let patient know to contact pharmacy at the end of the day to make sure medication is ready. ? ?Please notify patient to allow 48-72 hours to process ? ?CLINICAL FILLS OUT ALL BELOW:  ? ?LAST REFILL: ? ?QTY: ? ?REFILL DATE: ? ? ? ?OTHER COMMENTS:  please call pt she want to talk to you about medication/ pt stated her insurance will not pay for it ? ? ?Okay for refill? ? ?Please advise ? ? ?  ?

## 2021-07-02 NOTE — Telephone Encounter (Signed)
Order below routed to the wrong patient ? ?Corrected order to the right patient with Tonya from [place] ?

## 2021-07-04 NOTE — Telephone Encounter (Signed)
Noted. Thanks.

## 2021-07-09 ENCOUNTER — Other Ambulatory Visit: Payer: Self-pay | Admitting: Family Medicine

## 2021-07-10 ENCOUNTER — Other Ambulatory Visit: Payer: Self-pay | Admitting: Family Medicine

## 2021-07-20 ENCOUNTER — Telehealth: Payer: Self-pay | Admitting: Family Medicine

## 2021-07-20 DIAGNOSIS — R0600 Dyspnea, unspecified: Secondary | ICD-10-CM

## 2021-07-20 MED ORDER — FUROSEMIDE 20 MG PO TABS: 40.0000 mg | ORAL_TABLET | Freq: Every morning | ORAL | 2 refills | Status: DC

## 2021-07-20 NOTE — Telephone Encounter (Signed)
Encourage patient to contact the pharmacy for refills or they can request refills through [facility]  Did the patient contact the pharmacy:  Yes  LAST APPOINTMENT DATE:  06/24/2021  MEDICATION: furosemide (LASIX) 20 MG tablet  Is the patient out of medication? Yes  If not, how much is left? N/A  Is this a 90 day supply: N/A  PHARMACY:  [facility] - [place], [place] Phone Number: [phone]  Let patient know to contact pharmacy at the end of the day to make sure medication is ready.  Please notify patient to allow 48-72 hours to process

## 2021-07-20 NOTE — Telephone Encounter (Signed)
Erx sent

## 2021-07-20 NOTE — Progress Notes (Unsigned)
Primary Physician/Referring:  Tonia Ghent, MD  Patient ID: Jill Franco, female    DOB: [birth year], 87 y.o.   MRN: [id number]  No chief complaint on file.  HPI:    Jill Franco  is a 87 y.o. Caucasian female with history of hypertension, hyperlipidemia, PAD, tobacco use (smokes about half pack per day), patient reported history of CVA (unclear when), COPD, and hypothyroidism.  Denies history of diabetes, MI. Referred to our office for preoperative risk stratification by Dr. Fuller Song (vascular surgery) as patient has planned upcoming left femoral endarterectomy with retrograde iliac stenting.   Patient was last seen in our office 06/09/2021 for an urgent visit with concerns of dizziness and hypotension.  At that visit discontinued enalapril and reduced Lopressor 25 mg in the morning and 50 mg in the afternoon.  She was subsequently seen by PCP with increasing leg edema and uptrending weight, therefore was patient to increase Lasix dosing.  Patient now presents for 6-week follow-up of orthostatic hypotension. ***  ***  Patient presents for urgent visit at the request of her PCPs office given hypotension and dizziness ongoing for the last week.  Patient states she is otherwise fairly asymptomatic.  Patient's PCP stopped hydrochlorothiazide and Lasix 3 days ago, however dizziness has continued.  Notably patient reports she quit smoking in December 2022.  Past Medical History:  Diagnosis Date   Cataract    bilaterally corrected.   COPD (chronic obstructive pulmonary disease) ([place])    CVA (cerebral vascular accident) ([place])    Pt was unaware and does not know when it happened.  Was told after some testing was done.   Double vision    Dyspnea    Facial droop 10/2017   left   History of shingles 2012   HLD (hyperlipidemia)    HTN (hypertension)    Hypothyroidism    PVD (peripheral vascular disease) ([place])    Vision loss    temporary   Past Surgical History:  Procedure  Laterality Date   ABDOMINAL AORTOGRAM N/A 04/30/2021   Procedure: ABDOMINAL AORTOGRAM;  Surgeon: [name], MD;  Location: [facility];  Service: Cardiovascular;  Laterality: N/A;   ABDOMINAL EXPLORATION SURGERY  1950s   "they thought I was pregnant; went in to explore"   APPENDECTOMY  childhood   BACK SURGERY     BREAST LUMPECTOMY Left 1960's   benign   carotid ultrasound  06/19/2007   0-39% stenosis   CATARACT EXTRACTION W/ INTRAOCULAR LENS IMPLANT Left 09/2016   CATARACT EXTRACTION W/ INTRAOCULAR LENS IMPLANT Right 10/2016   COLONOSCOPY WITH PROPOFOL N/A 04/08/2019   Procedure: COLONOSCOPY WITH PROPOFOL;  Surgeon: Milus Banister, MD;  Location: [facility];  Service: Endoscopy;  Laterality: N/A;   DILATION AND CURETTAGE OF UTERUS  1960's X 3   miscarriages   DOPPLER ECHOCARDIOGRAPHY  06/19/2007   Normal EF 55-70%   ENDARTERECTOMY FEMORAL Left 12/03/2020   Procedure: LEFT FEMORAL ENDARTERECTOMY AND PROFUNDAPLASTY;  Surgeon: [name], MD;  Location: [facility];  Service: Vascular;  Laterality: Left;   ILIAC ARTERY STENT Left 03/18/2016   common iliac stent (6 x 40), aortic stent (10 x 19)/notes 03/18/2016   INSERTION OF ILIAC STENT Left 12/03/2020   Procedure: AORTIC STENTING, INSERTION OF BILATERAL COMMON ILIAC STENTS, AND LEFT EXTERNAL ILIAC STENT;  Surgeon: [name], MD;  Location: [facility];  Service: Vascular;  Laterality: Left;   LAMINECTOMY AND MICRODISCECTOMY SPINE  1960's X 2  LOWER EXTREMITY ANGIOGRAM N/A 12/03/2020   Procedure: Andris Baumann;  Surgeon: [name], MD;  Location: [facility];  Service: Vascular;  Laterality: N/A;   MOLE REMOVAL  07/10/2016   NSVD     x1   PATCH ANGIOPLASTY Left 12/03/2020   Procedure: PATCH ANGIOPLASTY OF LEFT COMMON FEMORAL ARTERY USING BOVINE PERICARDIUM PATCH;  Surgeon: [name], MD;  Location: [facility];  Service: Vascular;  Laterality: Left;   PERIPHERAL VASCULAR BALLOON ANGIOPLASTY Right 04/30/2021    Procedure: PERIPHERAL VASCULAR BALLOON ANGIOPLASTY;  Surgeon: [name], MD;  Location: [facility];  Service: Cardiovascular;  Laterality: Right;  rt common iliac   PERIPHERAL VASCULAR CATHETERIZATION N/A 03/18/2016   Procedure: Abdominal Aortogram w/Lower Extremity;  Surgeon: Elam Dutch, MD;  Location: [facility];  Service: Cardiovascular;  Laterality: N/A;   PERIPHERAL VASCULAR CATHETERIZATION  03/18/2016   Procedure: Peripheral Vascular Intervention;  Surgeon: Elam Dutch, MD;  Location: [facility];  Service: Cardiovascular;;   PERIPHERAL VASCULAR INTERVENTION Left 04/30/2021   Procedure: PERIPHERAL VASCULAR INTERVENTION;  Surgeon: [name], MD;  Location: [facility];  Service: Cardiovascular;  Laterality: Left;  lt common iliac   POLYPECTOMY  04/08/2019   Procedure: POLYPECTOMY;  Surgeon: Milus Banister, MD;  Location: [facility];  Service: Endoscopy;;   ULTRASOUND GUIDANCE FOR VASCULAR ACCESS Right 12/03/2020   Procedure: ULTRASOUND GUIDANCE FOR VASCULAR ACCESS, RIGHT FEMORAL ARTERY;  Surgeon: [name], MD;  Location: [facility];  Service: Vascular;  Laterality: Right;   Family History  Problem Relation Age of Onset   Stroke Father 64   Cancer Brother    Colon cancer Neg Hx    Breast cancer Neg Hx    Colon polyps Neg Hx    Esophageal cancer Neg Hx    Rectal cancer Neg Hx    Stomach cancer Neg Hx     Social History   Tobacco Use   Smoking status: Former    Packs/day: 0.25    Years: 69.00    Pack years: 17.25    Types: Cigarettes    Quit date: 01/2021    Years since quitting: 0.4    Passive exposure: Current   Smokeless tobacco: Never  Substance Use Topics   Alcohol use: No    Alcohol/week: 0.0 standard drinks   Marital Status: Widowed   ROS  Review of Systems  Cardiovascular:  Positive for claudication and dyspnea on exertion (stable). Negative for chest pain, leg swelling (resolved), near-syncope, orthopnea,  palpitations, paroxysmal nocturnal dyspnea and syncope.   Objective  There were no vitals taken for this visit.     06/24/2021   10:53 AM 06/09/2021   12:46 PM 06/09/2021    9:25 AM  Vitals with BMI  Height '5\' 4"'$  '5\' 4"'$  '5\' 4"'$   Weight 114 lbs 108 lbs 107 lbs 2 oz  BMI 19.56 63.87 56.43  Systolic 329 90 518  Diastolic 84 54 60  Pulse 55 72 60    No data found.    Physical Exam Vitals reviewed.  Constitutional:      Comments: Frail  Cardiovascular:     Rate and Rhythm: Normal rate and regular rhythm.     Pulses: Intact distal pulses.          Dorsalis pedis pulses are 0 on the right side and 0 on the left side.       Posterior tibial pulses are 0 on the right side  and 0 on the left side.     Heart sounds: S1 normal and S2 normal. No murmur heard.   No gallop.     Comments: No evidence of open wounds or ulcers. Right foot cooler to touch than left.  Pulmonary:     Effort: Pulmonary effort is normal.     Breath sounds: Decreased air movement (bilaterally throughout) present.  Musculoskeletal:     Right lower leg: No edema.     Left lower leg: No edema.  Skin:    Capillary Refill: Capillary refill takes 2 to 3 seconds.  Neurological:     Mental Status: She is alert.  Physical exam unchanged compared to previous office visit.  Laboratory examination:   Recent Labs    12/03/20 1225 12/04/20 0320 02/06/21 0609 02/09/21 1244 03/25/21 1252 04/06/21 0000 04/30/21 0743 06/07/21 1308  NA  --  134* 138   < > 137 143 139 137  K  --  3.4* 4.8   < > 3.7 4.8 4.3 3.6  CL  --  101 100   < > 99 102 101 95*  CO2  --  27 28   < > 33* 24  --  34*  GLUCOSE  --  154* 126*   < > 123* 88 83 89  BUN  --  14 21   < > '22 23 23 '$ 38*  CREATININE 0.75 0.87 0.97   < > 0.82 0.92 0.80 1.18  CALCIUM  --  8.4* 9.3   < > 9.3 9.5  --  9.5  GFRNONAA >60 >60 58*  --   --   --   --   --    < > = values in this interval not displayed.    CrCl cannot be calculated (Patient's most recent lab result is  older than the maximum 21 days allowed.).     Latest Ref Rng & Units 06/07/2021    1:08 PM 04/30/2021    7:43 AM 04/06/2021   12:00 AM  CMP  Glucose 70 - 99 mg/dL 89   83   88    BUN 6 - 23 mg/dL 38   23   23    Creatinine 0.40 - 1.20 mg/dL 1.18   0.80   0.92    Sodium 135 - 145 mEq/L 137   139   143    Potassium 3.5 - 5.1 mEq/L 3.6   4.3   4.8    Chloride 96 - 112 mEq/L 95   101   102    CO2 19 - 32 mEq/L 34    24    Calcium 8.4 - 10.5 mg/dL 9.5    9.5    Total Protein 6.0 - 8.3 g/dL 7.0      Total Bilirubin 0.2 - 1.2 mg/dL 0.5      Alkaline Phos 39 - 117 U/L 75      AST 0 - 37 U/L 19      ALT 0 - 35 U/L 14          Latest Ref Rng & Units 06/07/2021    1:08 PM 04/30/2021    7:43 AM 02/06/2021    6:09 AM  CBC  WBC 4.0 - 10.5 K/uL 9.9    7.6    Hemoglobin 12.0 - 15.0 g/dL 13.4   13.6   15.2    Hematocrit 36.0 - 46.0 % 40.1   40.0   45.3    Platelets 150.0 - 400.0  K/uL 229.0    225      Lipid Panel Recent Labs    09/18/20 0918 12/04/20 0320  CHOL 175 114  TRIG 99.0 37  LDLCALC 84 36  VLDL 19.8 7  HDL 71.20 71  CHOLHDL 2 1.6     HEMOGLOBIN A1C Lab Results  Component Value Date   HGBA1C 5.7 09/18/2020   TSH No results for input(s): TSH in the last 8760 hours. External labs:   None   Allergies   Allergies  Allergen Reactions   Aspirin Other (See Comments)    REACTION: Uncoated Stomach upset on empty stomach   Codeine Nausea Only and Other (See Comments)    Pill needs to be E.coated   Ibuprofen Nausea And Vomiting   Lipitor [Atorvastatin] Other (See Comments)    myalgia   Other Itching and Rash    Nicotine patches     Medications Prior to Visit:   Outpatient Medications Prior to Visit  Medication Sig Dispense Refill   albuterol (PROAIR HFA) 108 (90 Base) MCG/ACT inhaler Inhale 2 puffs into the lungs every 6 (six) hours as needed for wheezing or shortness of breath. 18 g 5   alendronate (FOSAMAX) 70 MG tablet Take 1 tablet (70 mg total) by mouth every  7 (seven) days. Take with a full glass of water on an empty stomach. 13 tablet 3   aspirin EC 81 MG tablet Take 1 tablet (81 mg total) by mouth daily. Swallow whole. 90 tablet 3   budesonide-formoterol (SYMBICORT) 160-4.5 MCG/ACT inhaler Inhale 2 puffs into the lungs in the morning and at bedtime. 30.6 g 3   Cholecalciferol (VITAMIN D3) 25 MCG (1000 UT) CAPS Take 1 capsule (1,000 Units total) by mouth daily.     clopidogrel (PLAVIX) 75 MG tablet Take 75 mg by mouth daily.     furosemide (LASIX) 20 MG tablet Take 2 tablets (40 mg total) by mouth in the morning. 30 tablet 2   levothyroxine (SYNTHROID) 50 MCG tablet TAKE 1 TABLET BY MOUTH  DAILY EXCEPT 2 TABLETS ON  SUNDAYS AND WEDNESDAYS (  TOTAL 9 TABS PER WEEK) 117 tablet 3   melatonin 5 MG TABS Take 5 mg by mouth at bedtime as needed.     metoprolol tartrate (LOPRESSOR) 50 MG tablet TAKE ONE-HALF TABLET BY  MOUTH IN THE MORNING AND 1  TABLET BY MOUTH IN THE  EVENING 60 tablet 3   rivaroxaban (XARELTO) 2.5 MG TABS tablet Take 1 tablet (2.5 mg total) by mouth 2 (two) times daily. 60 tablet 3   rosuvastatin (CRESTOR) 20 MG tablet Take 1 tablet (20 mg total) by mouth daily. 90 tablet 3   tiZANidine (ZANAFLEX) 4 MG tablet Take 0.5-1 tablets (2-4 mg total) by mouth 2 (two) times daily as needed for muscle spasms (sedation caution). 30 tablet 1   No facility-administered medications prior to visit.   Final Medications at End of Visit    No outpatient medications have been marked as taking for the 07/21/21 encounter (Appointment) with Rayetta Pigg, Melea Prezioso C, PA-C.   Radiology:   No results found.  Cardiac Studies:   Carotid Duplex 04/26/2019:  Right Carotid: Velocities in the right ICA are consistent with a 1-39%  stenosis. Non-hemodynamically significant plaque <50% noted in the  CCA. The ECA appears <50% stenosed.  Left Carotid: Velocities in the left ICA are consistent with a 40-59%  stenosis. Non-hemodynamically significant plaque <50% noted in  the  CCA. The ECA appears >50% stenosed.  Vertebrals:  Bilateral vertebral arteries demonstrate antegrade flow.  Subclavians: Normal flow hemodynamics were seen in bilateral subclavian arteries.   Vascular ultrasound aorta/IVC/iliacs 10/06/2020: IVC/Iliac: Suboptimal exam.  - Greater than 50% stenosis in the right CIA  - 50 - 99% stenosis distal to stent and proximal EIA, limited visualization  [facility] ECHOCARDIOGRAM COMPLETE 11/18/2020 Left ventricle cavity is normal in size. Mild concentric hypertrophy of the left ventricle. Normal global wall motion. Normal LV systolic function with visual EF 50-55%. Doppler evidence of grade I (impaired) diastolic dysfunction, normal LAP. Mild tricuspid regurgitation. No evidence of pulmonary hypertension.   [facility] MYOCARDIAL PERFUSION WITH LEXISCAN 11/25/2020 Lexiscan nuclear stress test performed using 1-day protocol. LV cavity is small. Normal myocardial perfusion. Stress LVEF 51%. TID is 1.30. In absence of regional perfusion abnormalities, this is a nonspecific finding. Recommend clinical correlation. Personally reviewed with Dr. Einar Gip who feels stress test is overall low risk.  ABI 06/08/2021:  Right: Resting right ankle-brachial index indicates moderate right lower extremity arterial disease. The right toe-brachial index is abnormal.  Left: Resting left ankle-brachial index indicates mild left lower extremity arterial disease. The left toe-brachial index is abnormal.   EKG:   03/29/2021: Sinus rhythm at a rate of 57 bpm.  Normal axis.  Left atrial enlargement.  Poor R wave progression, cannot include anteroseptal infarct old.  No evidence of ischemia or underlying injury pattern.  Compared to EKG 10/20/2020, no significant change.   Assessment   No diagnosis found.     There are no discontinued medications.   No orders of the defined types were placed in this encounter.    Recommendations:   Jill Franco is a 87 y.o. Caucasian female  with history of hypertension, hyperlipidemia, PAD, tobacco use (smokes about half pack per day), patient reported history of CVA (unclear when), COPD, and hypothyroidism.  Denies history of diabetes, MI. Referred to our office for preoperative risk stratification by Dr. Fuller Song (vascular surgery) as patient has planned upcoming left femoral endarterectomy with retrograde iliac stenting.   Patient was last seen in our office 06/09/2021 for an urgent visit with concerns of dizziness and hypotension.  At that visit discontinued enalapril and reduced Lopressor 25 mg in the morning and 50 mg in the afternoon.  She was subsequently seen by PCP with increasing leg edema and uptrending weight, therefore was patient to increase Lasix dosing.  Patient now presents for 6-week follow-up of orthostatic hypotension. ***  ***  Patient presents for urgent visit at the request of her PCP given dizziness and hypotension.  Despite discontinuation of Lasix and hydrochlorothiazide 3 days ago by PCP patient remains hypotensive and orthostatic with dizziness.  We will therefore discontinue enalapril and reduce Lopressor from 50 mg and 100 mg in the morning and afternoon to 25 mg in the morning and 50 mg in the afternoon.  Have advised patient to hold metoprolol today and recheck her blood pressure prior to taking metoprolol in the morning tomorrow.  If patient's systolic blood pressure is <120 mmHg patient will hold metoprolol and call our office, may need to discontinue metoprolol.  We will await results of urinalysis and BNP done in PCPs office earlier today.  Counseled patient regarding as needed dosing of Lasix.  Patient monitors daily weights at home, encouraged her to continue to do so.  Patient is scheduled to see PCP in 2 weeks, will defer close follow-up of orthostatic hypotension to PCPs office at that time.  She will follow-up with our office in 6  weeks, sooner if needed.   Alethia Berthold,  PA-C 07/20/2021, 2:01 PM Office: [phone]

## 2021-07-21 ENCOUNTER — Encounter: Payer: Self-pay | Admitting: Student

## 2021-07-21 ENCOUNTER — Telehealth: Payer: Self-pay

## 2021-07-21 ENCOUNTER — Ambulatory Visit: Payer: Medicare Other | Admitting: Student

## 2021-07-21 VITALS — BP 155/70 | HR 88 | Temp 98.2°F | Resp 16 | Ht 64.0 in | Wt 113.0 lb

## 2021-07-21 DIAGNOSIS — I951 Orthostatic hypotension: Secondary | ICD-10-CM | POA: Diagnosis not present

## 2021-07-21 DIAGNOSIS — R600 Localized edema: Secondary | ICD-10-CM

## 2021-07-21 NOTE — Telephone Encounter (Signed)
Patient is calling in asking for a nurse to give her a call back, as she has questions regarding these medications -  rivaroxaban (XARELTO) 2.5 MG TABS tablet // metoprolol tartrate (LOPRESSOR) 50 MG tablet

## 2021-07-21 NOTE — Telephone Encounter (Signed)
Called patient about her medications and answered her questions.

## 2021-07-29 ENCOUNTER — Other Ambulatory Visit: Payer: Self-pay | Admitting: Family Medicine

## 2021-08-01 ENCOUNTER — Other Ambulatory Visit: Payer: Self-pay | Admitting: Family Medicine

## 2021-08-02 ENCOUNTER — Other Ambulatory Visit: Payer: Self-pay | Admitting: Family Medicine

## 2021-08-05 ENCOUNTER — Other Ambulatory Visit: Payer: Self-pay | Admitting: Family Medicine

## 2021-08-07 ENCOUNTER — Other Ambulatory Visit: Payer: Self-pay | Admitting: Family Medicine

## 2021-08-07 DIAGNOSIS — R0600 Dyspnea, unspecified: Secondary | ICD-10-CM

## 2021-08-17 ENCOUNTER — Encounter: Payer: Self-pay | Admitting: Family Medicine

## 2021-08-17 ENCOUNTER — Ambulatory Visit (INDEPENDENT_AMBULATORY_CARE_PROVIDER_SITE_OTHER): Payer: Medicare Other | Admitting: Family Medicine

## 2021-08-17 DIAGNOSIS — R059 Cough, unspecified: Secondary | ICD-10-CM

## 2021-08-17 DIAGNOSIS — R609 Edema, unspecified: Secondary | ICD-10-CM

## 2021-08-17 DIAGNOSIS — I739 Peripheral vascular disease, unspecified: Secondary | ICD-10-CM

## 2021-08-17 MED ORDER — METOPROLOL TARTRATE 100 MG PO TABS: ORAL_TABLET | ORAL | 2 refills | Status: DC

## 2021-08-17 MED ORDER — PREDNISONE 10 MG PO TABS: ORAL_TABLET | ORAL | 0 refills | Status: DC

## 2021-08-17 NOTE — Progress Notes (Signed)
Metoprolol dose clarified and med list updated.  She is taking metoprolol '50mg'$  QD PRN if SBP >120.  She is taking metoprolol if needed but not daily, only used a few times in a week.    Her xarelto is prohibitively expensive, d/w pt about getting pharmacy help here at clinic.  I sent a note to staff at the [facility].  I appreciate the help of all involved.  She can occ get lightheaded on standing.  She didn't feel her heart racing.  D/w pt about trying a lower dose of lasix, depending on her sx.  See avs.  She is taking adequate fluid per her report, routine cautions d/w pt.    She doesn't have leg cramping with walking.    Cough for a few weeks now, sick contact at home. Still using inhalers at baseline.  Chest congestion, clear sputum.  Some occ wheeze.  No fevers. Occ dyspnea noted, right after getting out of bed (at nadir of inhaler effect from the night before).  Meds, vitals, and allergies reviewed.   ROS: Per HPI unless specifically indicated in ROS section   [place]ad [place]ck supple, no LA Rrr Ctab, no focal decrease in breath sounds. Abdomen soft.  Nontender. No BLE edema.   Skin well perfused.  30 minutes were devoted to patient care in this encounter (this includes time spent reviewing the patient's file/history, interviewing and examining the patient, counseling/reviewing plan with patient).

## 2021-08-18 ENCOUNTER — Telehealth: Payer: Self-pay | Admitting: Pharmacist

## 2021-08-18 ENCOUNTER — Telehealth: Payer: Medicare Other

## 2021-08-18 DIAGNOSIS — R059 Cough, unspecified: Secondary | ICD-10-CM | POA: Insufficient documentation

## 2021-08-18 DIAGNOSIS — I739 Peripheral vascular disease, unspecified: Secondary | ICD-10-CM

## 2021-08-18 MED ORDER — XARELTO 2.5 MG PO TABS: 2.5000 mg | ORAL_TABLET | Freq: Two times a day (BID) | ORAL | 1 refills | Status: DC

## 2021-08-18 NOTE — Addendum Note (Signed)
Addended by: Charlton Haws on: 08/18/2021 04:51 PM   Modules accepted: Orders

## 2021-08-18 NOTE — Assessment & Plan Note (Signed)
Reasonable to try prednisone in the short-term with routine steroid cautions discussed with patient.  She agrees with plan.  She will update me as needed.  Okay for outpatient follow-up.

## 2021-08-18 NOTE — Telephone Encounter (Signed)
Spoke with patient, she would like to enroll in [employer]. Enrolled her online, she will be enrolled until end of 2023, when insurance resets in Jan 2024 she will resume filling Xarelto and regular pharmacy.  Sent Xarelto eRx to [facility]. Advised patient Carney Harder will call her to coordinate shipment and verify payment info. Pt voiced understanding.

## 2021-08-18 NOTE — Assessment & Plan Note (Signed)
Discussed options. If lightheaded, then try taking 1 tab of lasix that day and she can see how she feels.

## 2021-08-18 NOTE — Telephone Encounter (Signed)
Noted. Thanks.

## 2021-08-18 NOTE — Telephone Encounter (Signed)
Per patient's daughter, Xarelto copay is increasing from $100 to $400 through mail order (90-day supply). [employer]arelto no longer offers patients assistance for insured patients, but pt can enroll in [employer] for reduced cost Xarelto - this will bring cost down to $240 per 90 day suppy and Xarelto will be shipped from [facility].  Patient will call back if she would like to enroll.

## 2021-08-18 NOTE — Assessment & Plan Note (Signed)
I am asking for pharmacy help on Xarelto cost.  Continue Xarelto for now.

## 2021-08-25 ENCOUNTER — Telehealth: Payer: Self-pay

## 2021-08-25 NOTE — Chronic Care Management (AMB) (Signed)
Chronic Care Management Pharmacy Assistant   Name: Jill Franco  MRN: [id number] DOB: 09-03-1934   Reason for Encounter: Hypertension Disease State    Recent office visits:  08/17/21-Graham Duncan,MD(PCP)-cough,start prednisone '10mg'$  taper dose,Her xarelto is prohibitively expensive, d/w pt about getting pharmacy help here at clinic. 06/24/32-Graham Duncan,MD(PCP)-f/u PND-try 2 furosemide tablets in am,call with BP and weight in a few days-take metoprolol is BP is >120 06/09/21-Tabitha Dugal,FNP(fam med)-f/u dizziness,labs ordered, UA,Chest xray,Resume lasix 20 mg only once daily, temporarily d/c hctz for now due to lower blood pressure. F/u 2 weeks 06/07/21-Tabitha Dugal,FNP(fam med)- f/u dizziness -labs ordered,EKG,  D/c lasix and hctz x 2 days ,increase water intake.f/u 2 days with BP log.        Recent consult visits: 07/21/21-Celeste Cantwell,PA(cardio)-f/u PAD,hypotension,no medication changes f/u 3 months 06/09/21-Celeste Cantwell,PA(cardio)-f/u dizziness,will therefore discontinue enalapril and reduce Lopressor from 50 mg and 100 mg in the morning and afternoon to 25 mg in th  e morning and 50 mg in the afternoon f/u 6 weeks 06/08/21-Thomas Hawken,MD(vas.surg.)post op endarterectomy and profundaplasty.No medication changes  Hospital visits:  None in previous 6 months  Medications: Outpatient Encounter Medications as of 08/25/2021  Medication Sig   albuterol (PROAIR HFA) 108 (90 Base) MCG/ACT inhaler Inhale 2 puffs into the lungs every 6 (six) hours as needed for wheezing or shortness of breath.   alendronate (FOSAMAX) 70 MG tablet Take 1 tablet (70 mg total) by mouth every 7 (seven) days. Take with a full glass of water on an empty stomach.   aspirin EC 81 MG tablet Take 1 tablet (81 mg total) by mouth daily. Swallow whole.   budesonide-formoterol (SYMBICORT) 160-4.5 MCG/ACT inhaler Inhale 2 puffs into the lungs in the morning and at bedtime.   Cholecalciferol (VITAMIN D3) 25  MCG (1000 UT) CAPS Take 1 capsule (1,000 Units total) by mouth daily.   furosemide (LASIX) 20 MG tablet TAKE 2 TABLETS BY MOUTH IN THE  MORNING   levothyroxine (SYNTHROID) 50 MCG tablet TAKE 1 TABLET BY MOUTH  DAILY EXCEPT 2 TABLETS ON  SUNDAYS AND WEDNESDAYS (  TOTAL 9 TABS PER WEEK)   melatonin 5 MG TABS Take 5 mg by mouth at bedtime as needed.   metoprolol tartrate (LOPRESSOR) 100 MG tablet 1/2 tab ('50mg'$ ) daily if needed if SBP is above 120.   predniSONE (DELTASONE) 10 MG tablet Take 2 a day for 5 days, then 1 a day for 5 days, with food. Don't take with aleve/ibuprofen.   rivaroxaban (XARELTO) 2.5 MG TABS tablet Take 1 tablet (2.5 mg total) by mouth 2 (two) times daily.   rosuvastatin (CRESTOR) 20 MG tablet Take 1 tablet (20 mg total) by mouth daily.   tiZANidine (ZANAFLEX) 4 MG tablet Take 0.5-1 tablets (2-4 mg total) by mouth 2 (two) times daily as needed for muscle spasms (sedation caution).   No facility-administered encounter medications on file as of 08/25/2021.    Recent Office Vitals: BP Readings from Last 3 Encounters:  08/17/21 104/66  07/21/21 (!) 155/70  06/24/21 124/84   Pulse Readings from Last 3 Encounters:  08/17/21 (!) 52  07/21/21 88  06/24/21 (!) 55    Wt Readings from Last 3 Encounters:  08/17/21 110 lb (49.9 kg)  07/21/21 113 lb (51.3 kg)  06/24/21 114 lb (51.7 kg)     Kidney Function Lab Results  Component Value Date/Time   CREATININE 1.18 06/07/2021 01:08 PM   CREATININE 0.80 04/30/2021 07:43 AM   GFR 42.25 (L) 06/07/2021 01:08  PM   GFRNONAA 58 (L) 02/06/2021 06:09 AM   GFRAA >60 04/26/2019 03:05 PM       Latest Ref Rng & Units 06/07/2021    1:08 PM 04/30/2021    7:43 AM 04/06/2021   12:00 AM  BMP  Glucose 70 - 99 mg/dL 89  83  88   BUN 6 - 23 mg/dL 38  23  23   Creatinine 0.40 - 1.20 mg/dL 1.18  0.80  0.92   BUN/Creat Ratio 12 - 28   25   Sodium 135 - 145 mEq/L 137  139  143   Potassium 3.5 - 5.1 mEq/L 3.6  4.3  4.8   Chloride 96 - 112 mEq/L  95  101  102   CO2 19 - 32 mEq/L 34   24   Calcium 8.4 - 10.5 mg/dL 9.5   9.5      Contacted patient on 08/26/21 to discuss hypertension disease state  Current antihypertensive regimen: Furosemide 20 mg- take 2 tablets in the am   HCTZ 12.5 mg -1 tablet daily  Metoprolol tartrate 100 mg - 1/2 tab AM, 1 tab PM  she will take 1 tablet in am if BP is elevated    Patient verbally confirms she is taking the above medications as directed. Yes  How often are you checking your Blood Pressure? daily  she checks her blood pressure in the morning before taking her medication.  Current home BP readings:  DATE:             BP               PULSE  07/27/21  126/68  -  07/28/21  140/68  -  07/29/21  141/64  -  07/30/21  139/72  -  08/01/21 113/77  -  08/24/21  171/61  -  08/25/21  118/68  -  08/26/21  117/75  -   Discussed the high BP reading of 171/61 and the patient did not have any symptoms of HA, dizziness, vision changes, she had not eaten anything fried.  Wrist or arm cuff: arm cuff Caffeine intake: none  Salt intake:limits adding to food Over the counter medications including pseudoephedrine or NSAIDs?  Any readings above 180/120? No   What recent interventions/DTPs have been made by any provider to improve Blood Pressure control since last CPP Visit: changed the patient to Xarelto BID, she will pay $249 for 90ds, the patient is happy with this medication.  Any recent hospitalizations or ED visits since last visit with CPP? No  What diet changes have been made to improve Blood Pressure Control?   The  patient reports cooks healthy meals at home, does not get takeout . Limits adding salt to food. The patient drinks plenty of water  What exercise is being done to improve your Blood Pressure Control?   Patient walks around in the grocery store, she plants a garden for the summer.walks around her yard.  Adherence Review: Is the patient currently on ACE/ARB medication? No Does the patient have  >5 day gap between last estimated fill dates? No   Star Rating Drugs:  Medication:  Last Fill: Day Supply Rosuvastatin '20mg'$  07/01/21 90    Care Gaps: Annual wellness visit in last year? Yes Most Recent BP reading:104/66  52-P  08/17/21  Upcoming appointments: No appointments scheduled within the next 30 days.   Charlene Brooke, CPP notified  Avel Sensor, [place]  [phone]

## 2021-09-06 NOTE — Progress Notes (Unsigned)
[facility] [place]  ASSESSMENT / PLAN: Jill Franco is a 87 y.o. female with atherosclerosis of native and stented aortoiliac occlusive disease as well as bilateral lower extremity arteries initially causing causing disabling claudication. Now status post left femoral endarterectomy and profundaplasty, complete endovascular reconstruction of aortic bifurcation (CERAB) 12/03/20. This required endovascular revision 04/30/21 for mechanical compression of the iliac stenting.   Recommend the following which can slow the progression of atherosclerosis and reduce the risk of major adverse cardiac / limb events:  Complete cessation from all tobacco products. Blood glucose control with goal A1c < 7%. Blood pressure control with goal blood pressure < 140/90 mmHg. Lipid reduction therapy with goal LDL-C <100 mg/dL (<70 if symptomatic from PAD).  Aspirin '81mg'$  PO QD.  Atorvastatin 40-'80mg'$  PO QD (or other "high intensity" statin therapy).  Patient had early relief after endovascular revision, but unfortunately her symptoms have recurred.  I counseled her extensively about her options going forward.  I think further aortoiliac stenting is not likely to be successful given her diminutive arteries and early treatment failures.   Our options going forward include aortobifemoral bypass, (for which I do not think she is an excellent candidate), or extra-anatomic bypass such as axillobifemoral bypass.  I explained to her that we should avoid intervention as long as she can tolerate.  I will see her again in six months with repeat ABI.  CHIEF COMPLAINT: Calf pain with walking  HISTORY OF PRESENT ILLNESS: Jill Franco is a 86 y.o. female who returns to clinic for surveillance of peripheral arterial disease.  She reports deteriorating claudication symptoms.  She is now disabled by her symptoms.  She can only walk a short distance before severe pain sets in bilateral calves.  She has no  wounds about her feet.  She occasionally has pain in her foot at rest, but does not report symptoms typical of ischemic rest pain.  She continues to smoke, but is working hard to quit.  She is using patches with some success.  10/27/20: patient returns after evaluation by Dr. Einar Gip. Pending stress test and echo. No interval changes.  01/11/21: Patient returns after hybrid revascularization.  She reports complete symptomatic relief of claudication.  She is walking as far as she likes.  She is appreciative.   04/20/21: Patient returns for follow-up evaluation.  She has had return of severe symptoms in her bilateral lower extremities, left worse than right.  She endorses early rest pain symptoms in her left foot.  She has short distance, disabling claudication in both calves.  06/08/21: Patient returns to clinic.  After intervention, the patient had early relief of her symptoms, but they have now returned.  We had a long discussion about our options going forward.  She is maximally medically managed for peripheral arterial disease.  Her symptoms are not yet disabling to her.  09/07/21: patient returns to clinic and reports his symptoms have improved. She has started on Xarelto. She attributes improvement in her symptoms to Xarelto.   VASCULAR SURGICAL HISTORY:  03/18/16 -- left common iliac stent (6 x 40), terminal aortic stent (10 x 19)  ([facility])  12/03/20 --  1) US guided right common femoral artery access 2) Aortogram and pelvic angiogram 3) Terminal aortic stenting (11x8m VBX post dilated to ~189m 4) Right common iliac artery stenting (7x5936mBX post dilated to 9mm66moximally) 5) Left common iliac artery stenting (7x59mm66m post dilated to 9mm p6mimally) 6) left external iliac artery stenting (  7x13m viabahn) 7) left femoral endarterectomy, profundaplasty, and patch angioplasty (bovine pericardium)  04/30/21 -- Angioplasty of bilateral common iliac artery stents (7x672mMustang) Left  external iliac artery stenting (7x6038mluvia)  VASCULAR RISK FACTORS: Positive history of stroke / transient ischemic attack. Negative history of coronary artery disease.  Negative history of diabetes mellitus. Last A1c 5.7. Positive history of smoking. Not actively smoking. Positive history of hypertension.  No reported history of chronic kidney disease but last GFR 58-73. CKD stage 2 - 3a. Negative history of chronic obstructive pulmonary disease, but patient has been treated with inhalers with symptomatic relief.   FUNCTIONAL STATUS: ECOG performance status: (1) Restricted in physically strenuous activity, ambulatory and able to do work of light nature Ambulatory status: Ambulatory within the community with limits  Past Medical History:  Diagnosis Date   Cataract    bilaterally corrected.   COPD (chronic obstructive pulmonary disease) ([facility]  [facility]re[facility] vascular accident) ([facility]  Pt was unaware and does not know when it happened.  Was told after some testing was done.   Double vision    Dyspnea    Facial droop 10/2017   left   History of shingles 2012   HLD (hyperlipidemia)    HTN (hypertension)    Hypothyroidism    PVD (peripheral vascular disease) ([facility]  Vision loss    temporary    Past Surgical History:  Procedure Laterality Date   ABDOMINAL AORTOGRAM N/A 04/30/2021   Procedure: ABDOMINAL AORTOGRAM;  Surgeon: HawCherre RobinsD;  Location: [facility];  Service: Cardiovascular;  Laterality: N/A;   ABDOMINAL EXPLORATION SURGERY  1950s   "they thought I was pregnant; went in to explore"   APPENDECTOMY  childhood   BACK SURGERY     BREAST LUMPECTOMY Left 1960's   benign   carotid ultrasound  06/19/2007   0-39% stenosis   CATARACT EXTRACTION W/ INTRAOCULAR LENS IMPLANT Left 09/2016   CATARACT EXTRACTION W/ INTRAOCULAR LENS IMPLANT Right 10/2016   COLONOSCOPY WITH PROPOFOL N/A 04/08/2019   Procedure: COLONOSCOPY WITH PROPOFOL;  Surgeon: JacMilus BanisterD;  [facility];  Service: Endoscopy;  Laterality: N/A;   DILATION AND CURETTAGE OF UTERUS  1960's X 3   miscarriages   DOPPLER ECHOCARDIOGRAPHY  06/19/2007   Normal EF 55-70%   ENDARTERECTOMY FEMORAL Left 12/03/2020   Procedure: LEFT FEMORAL ENDARTERECTOMY AND PROFUNDAPLASTY;  Surgeon: HawCherre RobinsD;  [facility];  Laterality: Left;   ILIAC ARTERY STENT Left 03/18/2016   common iliac stent (6 x 40), aortic stent (10 x 19)/notes 03/18/2016   INSERTION OF ILIAC STENT Left 12/03/2020   Procedure: AORTIC STENTING, INSERTION OF BILATERAL COMMON ILIAC STENTS, AND LEFT EXTERNAL ILIAC STENT;  Surgeon: HawCherre RobinsD;  [facility];  Laterality: Left;   LAMINECTOMY AND MICRODISCECTOMY SPINE  1960's X 2   LOWER EXTREMITY ANGIOGRAM N/A 12/03/2020   Procedure: ABOAndris BaumannSurgeon: HawCherre RobinsD;  [facility];  Laterality: N/A;   MOLE REMOVAL  07/10/2016   NSVD     x1   PATCH ANGIOPLASTY Left 12/03/2020   Procedure: PATCH ANGIOPLASTY OF LEFT COMMON FEMORAL ARTERY USING BOVINE PERICARDIUM PATCH;  Surgeon: HawCherre RobinsD;  [facility];  Laterality: Left;   PERIPHERAL VASCULAR BALLOON ANGIOPLASTY Right 04/30/2021   Procedure: PERIPHERAL VASCULAR BALLOON ANGIOPLASTY;  Surgeon: HawCherre Robins  MD;  Location: [facility];  Service: Cardiovascular;  Laterality: Right;  rt common iliac   PERIPHERAL VASCULAR CATHETERIZATION N/A 03/18/2016   Procedure: Abdominal Aortogram w/Lower Extremity;  Surgeon: Elam Dutch, MD;  Location: [facility] [facility];  Service: Cardiovascular;  Laterality: N/A;   PERIPHERAL VASCULAR CATHETERIZATION  03/18/2016   Procedure: Peripheral Vascular Intervention;  Surgeon: Elam Dutch, MD;  Location: [place];  Service: Cardiovascular;;   PERIPHERAL VASCULAR INTERVENTION Left 04/30/2021   Procedure: PERIPHERAL VASCULAR  INTERVENTION;  Surgeon: [name], MD;  Location: [facility];  Service: Cardiovascular;  Laterality: Left;  lt common iliac   POLYPECTOMY  04/08/2019   Procedure: POLYPECTOMY;  Surgeon: Milus Banister, MD;  [facility];  Service: Endoscopy;;   ULTRASOUND GUIDANCE FOR VASCULAR ACCESS Right 12/03/2020   Procedure: ULTRASOUND GUIDANCE FOR VASCULAR ACCESS, RIGHT FEMORAL ARTERY;  Surgeon: [name], MD;  [facility];  Service: Vascular;  Laterality: Right;    Family History  Problem Relation Age of Onset   Stroke Father 27   Cancer Brother    Colon cancer Neg Hx    Breast cancer Neg Hx    Colon polyps Neg Hx    Esophageal cancer Neg Hx    Rectal cancer Neg Hx    Stomach cancer Neg Hx     Social History   Socioeconomic History   Marital status: Widowed    Spouse name: Not on file   Number of children: 1   Years of education: Not on file   Highest education level: Not on file  Occupational History   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    Comment: Glass blower/designer  Tobacco Use   Smoking status: Former    Packs/day: 0.25    Years: 69.00    Total pack years: 17.25    Types: Cigarettes    Quit date: 01/2021    Years since quitting: 0.6    Passive exposure: Current   Smokeless tobacco: Never  Vaping Use   Vaping Use: Never used  Substance and Sexual Activity   Alcohol use: No    Alcohol/week: 0.0 standard drinks of alcohol   Drug use: No   Sexual activity: Not on file  Other Topics Concern   Not on file  Social History Narrative   From [place]   Retired 2007   Widowed 2008 after 61 years   Enjoys yard work, but needs some help with yard work now.     Her daughter is helping at home some (some as of 2022).  Her daughter has vision loss and is widowed as of 2022 and moved in with patient.   Social Determinants of Health   Financial Resource Strain: Medium Risk (05/11/2021)   Overall Financial Resource Strain (CARDIA)    Difficulty of  Paying Living Expenses: Somewhat hard  Food Insecurity: No Food Insecurity (05/11/2021)   Hunger Vital Sign    Worried About Running Out of Food in the Last Year: Never true    Ran Out of Food in the Last Year: Never true  Transportation Needs: Not on file  Physical Activity: Not on file  Stress: Not on file  Social Connections: Not on file  Intimate Partner Violence: Not on file    Allergies  Allergen Reactions   Aspirin Other (See Comments)    REACTION: Uncoated Stomach upset on empty stomach   Codeine Nausea Only and Other (See Comments)    Pill needs to be  E.coated   Ibuprofen Nausea And Vomiting   Lipitor [Atorvastatin] Other (See Comments)    myalgia   Other Itching and Rash    Nicotine patches     Current Outpatient Medications  Medication Sig Dispense Refill   albuterol (PROAIR HFA) 108 (90 Base) MCG/ACT inhaler Inhale 2 puffs into the lungs every 6 (six) hours as needed for wheezing or shortness of breath. 18 g 5   alendronate (FOSAMAX) 70 MG tablet Take 1 tablet (70 mg total) by mouth every 7 (seven) days. Take with a full glass of water on an empty stomach. 13 tablet 3   aspirin EC 81 MG tablet Take 1 tablet (81 mg total) by mouth daily. Swallow whole. 90 tablet 3   budesonide-formoterol (SYMBICORT) 160-4.5 MCG/ACT inhaler Inhale 2 puffs into the lungs in the morning and at bedtime. 30.6 g 3   Cholecalciferol (VITAMIN D3) 25 MCG (1000 UT) CAPS Take 1 capsule (1,000 Units total) by mouth daily.     furosemide (LASIX) 20 MG tablet TAKE 2 TABLETS BY MOUTH IN THE  MORNING 180 tablet 0   levothyroxine (SYNTHROID) 50 MCG tablet TAKE 1 TABLET BY MOUTH  DAILY EXCEPT 2 TABLETS ON  SUNDAYS AND WEDNESDAYS (  TOTAL 9 TABS PER WEEK) 117 tablet 3   melatonin 5 MG TABS Take 5 mg by mouth at bedtime as needed.     metoprolol tartrate (LOPRESSOR) 100 MG tablet 1/2 tab ('50mg'$ ) daily if needed if SBP is above 120. 150 tablet 2   predniSONE (DELTASONE) 10 MG tablet Take 2 a day for 5 days,  then 1 a day for 5 days, with food. Don't take with aleve/ibuprofen. 15 tablet 0   rivaroxaban (XARELTO) 2.5 MG TABS tablet Take 1 tablet (2.5 mg total) by mouth 2 (two) times daily. 180 tablet 1   rosuvastatin (CRESTOR) 20 MG tablet Take 1 tablet (20 mg total) by mouth daily. 90 tablet 3   tiZANidine (ZANAFLEX) 4 MG tablet Take 0.5-1 tablets (2-4 mg total) by mouth 2 (two) times daily as needed for muscle spasms (sedation caution). 30 tablet 1   No current facility-administered medications for this visit.    PHYSICAL EXAM There were no vitals filed for this visit.  Constitutional: well appearing. no distress. Thin. Appears marginally nourished.  Neurologic: CN intact. no focal findings. no sensory loss. Psychiatric:  Mood and affect symmetric and appropriate. Eyes:  No icterus. No conjunctival pallor. Ears, nose, throat:  mucous membranes moist. Midline trachea.  Cardiac: regular rate and rhythm.  Respiratory:  unlabored. Abdominal:  soft, non-tender, non-distended.  Peripheral vascular: no palpable pedal pulses Extremity: no edema. no cyanosis. no pallor.  Skin: no gangrene. no ulceration. Atrophic skin. Lymphatic: no Stemmer's sign. no palpable lymphadenopathy.  PERTINENT LABORATORY AND RADIOLOGIC DATA  Most recent CBC    Latest Ref Rng & Units 06/07/2021    1:08 PM 04/30/2021    7:43 AM 02/06/2021    6:09 AM  CBC  WBC 4.0 - 10.5 K/uL 9.9   7.6   Hemoglobin 12.0 - 15.0 g/dL 13.4  13.6  15.2   Hematocrit 36.0 - 46.0 % 40.1  40.0  45.3   Platelets 150.0 - 400.0 K/uL 229.0   225      Most recent CMP    Latest Ref Rng & Units 06/07/2021    1:08 PM 04/30/2021    7:43 AM 04/06/2021   12:00 AM  CMP  Glucose 70 - 99 mg/dL 89  83  88  BUN 6 - 23 mg/dL 38  23  23   Creatinine 0.40 - 1.20 mg/dL 1.18  0.80  0.92   Sodium 135 - 145 mEq/L 137  139  143   Potassium 3.5 - 5.1 mEq/L 3.6  4.3  4.8   Chloride 96 - 112 mEq/L 95  101  102   CO2 19 - 32 mEq/L 34   24   Calcium 8.4 -  10.5 mg/dL 9.5   9.5   Total Protein 6.0 - 8.3 g/dL 7.0     Total Bilirubin 0.2 - 1.2 mg/dL 0.5     Alkaline Phos 39 - 117 U/L 75     AST 0 - 37 U/L 19     ALT 0 - 35 U/L 14       Renal function CrCl cannot be calculated (Patient's most recent lab result is older than the maximum 21 days allowed.).  Hgb A1c MFr Bld (%)  Date Value  09/18/2020 5.7    LDL Cholesterol  Date Value Ref Range Status  12/04/2020 36 0 - 99 mg/dL Final    Comment:           Total Cholesterol/HDL:CHD Risk Coronary Heart Disease Risk Table                     Men   Women  1/2 Average Risk   3.4   3.3  Average Risk       5.0   4.4  2 X Average Risk   9.6   7.1  3 X Average Risk  23.4   11.0        Use the calculated Patient Ratio above and the CHD Risk Table to determine the patient's CHD Risk.        ATP III CLASSIFICATION (LDL):  <100     mg/dL   Optimal  100-129  mg/dL   Near or Above                    Optimal  130-159  mg/dL   Borderline  160-189  mg/dL   High  >190     mg/dL   Very High Performed at [facility] [address] [place], [place] [address]    Direct LDL  Date Value Ref Range Status  06/19/2012 115.8 mg/dL Final    Comment:    Optimal:  <100 mg/dLNear or Above Optimal:  100-129 mg/dLBorderline High:  130-159 mg/dLHigh:  160-189 mg/dLVery High:  >190 mg/dL       Yevonne Aline. Stanford Breed, MD [facility] Phone Number: [phone] 09/06/2021 2:22 PM

## 2021-09-07 ENCOUNTER — Encounter: Payer: Self-pay | Admitting: Vascular Surgery

## 2021-09-07 ENCOUNTER — Ambulatory Visit (HOSPITAL_COMMUNITY)
Admission: RE | Admit: 2021-09-07 | Discharge: 2021-09-07 | Disposition: A | Discharge status: Home / Self Care | Payer: Medicare Other | Source: Ambulatory Visit | Attending: Vascular Surgery | Admitting: Vascular Surgery

## 2021-09-07 ENCOUNTER — Ambulatory Visit: Payer: Medicare Other | Admitting: Vascular Surgery

## 2021-09-07 VITALS — BP 125/64 | HR 78 | Temp 98.6°F | Resp 20 | Ht 64.0 in | Wt 110.0 lb

## 2021-09-07 DIAGNOSIS — I739 Peripheral vascular disease, unspecified: Secondary | ICD-10-CM | POA: Insufficient documentation

## 2021-09-07 DIAGNOSIS — I7409 Other arterial embolism and thrombosis of abdominal aorta: Secondary | ICD-10-CM | POA: Diagnosis not present

## 2021-09-14 NOTE — Telephone Encounter (Signed)
Error

## 2021-09-15 ENCOUNTER — Other Ambulatory Visit: Payer: Self-pay

## 2021-09-15 DIAGNOSIS — I70222 Atherosclerosis of native arteries of extremities with rest pain, left leg: Secondary | ICD-10-CM

## 2021-09-15 DIAGNOSIS — I739 Peripheral vascular disease, unspecified: Secondary | ICD-10-CM

## 2021-09-20 IMAGING — CT CT ANGIO AOBIFEM WO/W CM
2 of 3 series · 13 of 42 positions shown, 15 images · IV contrast (iopamidol)
Comparison: 04/05/2019

CLINICAL DATA: 86-year-old female with history of aorto bi-iliac
occlusive disease status post distal aortic and left common iliac
stent placement in 3221.

EXAM:
CT ANGIOGRAPHY OF ABDOMINAL AORTA WITH ILIOFEMORAL RUNOFF
TECHNIQUE: Multidetector CT imaging of the abdomen, pelvis and lower
extremities was performed using the standard protocol during bolus
administration of intravenous contrast. Multiplanar CT image
reconstructions and MIPs were obtained to evaluate the vascular
anatomy.
CONTRAST:  100mL OIGOXF-G73 IOPAMIDOL (OIGOXF-G73) INJECTION 76%

[Series 6: angioao-bifem 3mm · axial · 0.98mm/px · z∈[-1184,-64]mm · 11 of 249 slices shown, 13 images]
[im 17/249  soft-tissue]
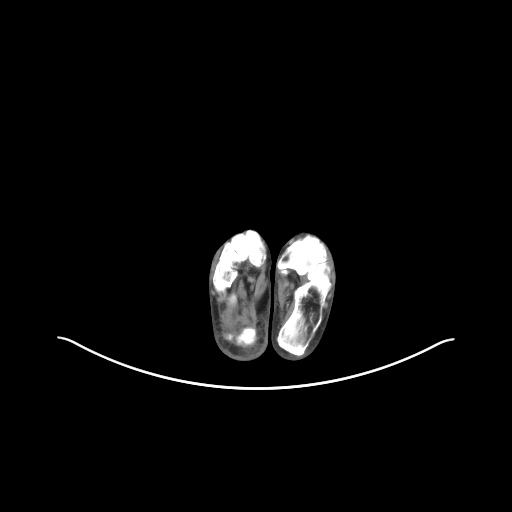
[im 17/249  bone]
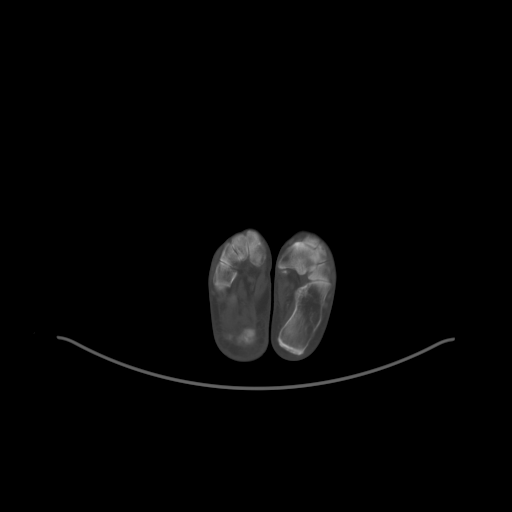
[im 49/249  soft-tissue]
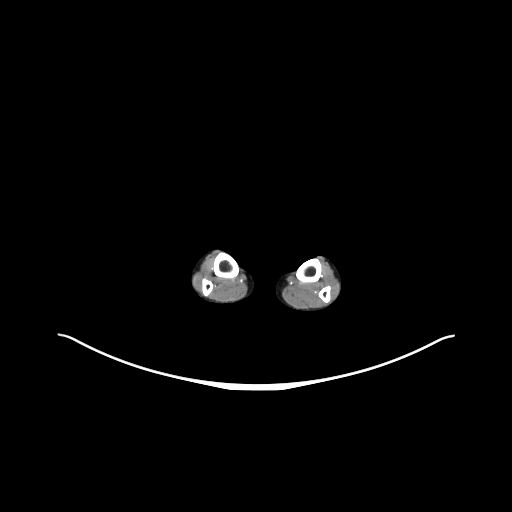
[im 81/249  soft-tissue]
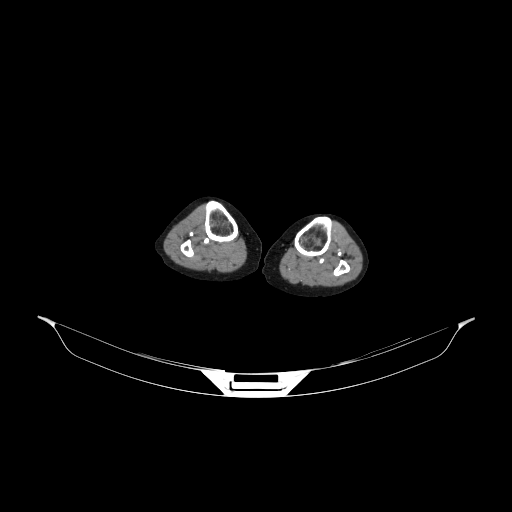
[im 113/249  soft-tissue]
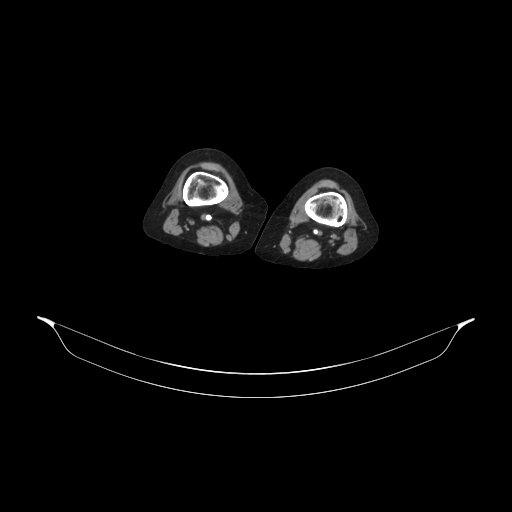
[im 137/249  soft-tissue]
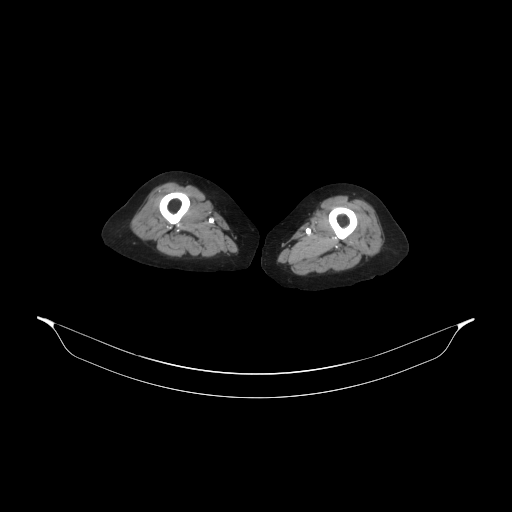
[im 169/249  soft-tissue]
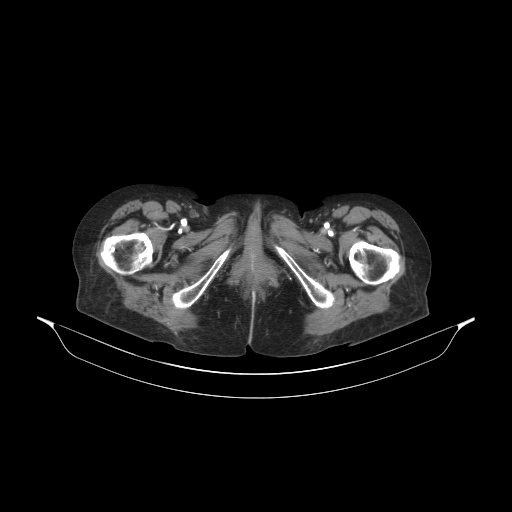
[im 201/249  soft-tissue]
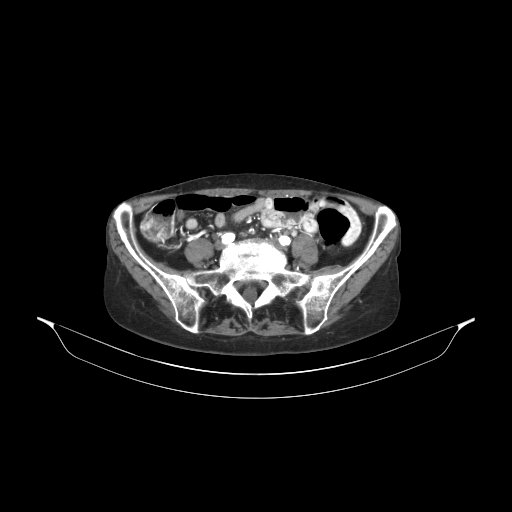
[im 217/249  lung]
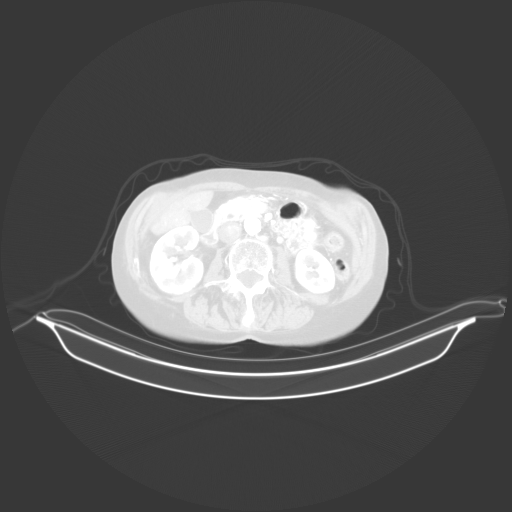
[im 225/249  lung]
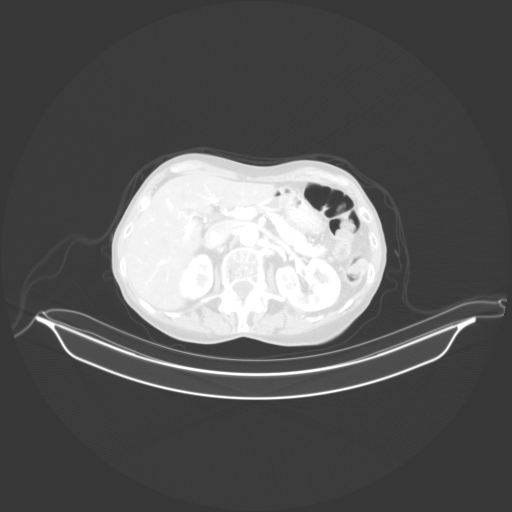
[im 233/249  soft-tissue]
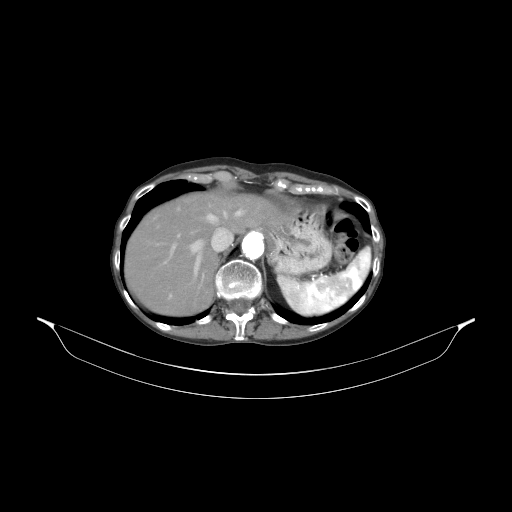
[im 233/249  lung]
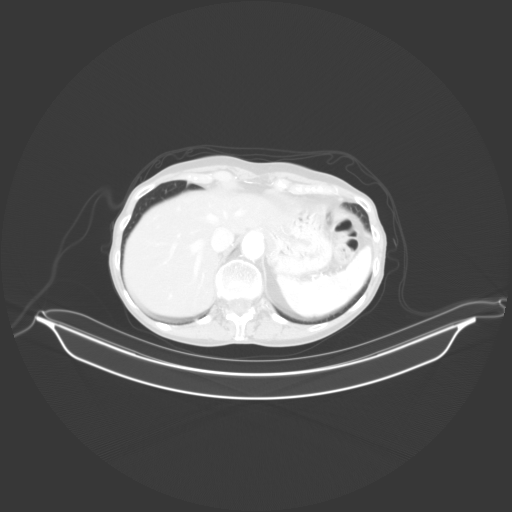
[im 241/249  lung]
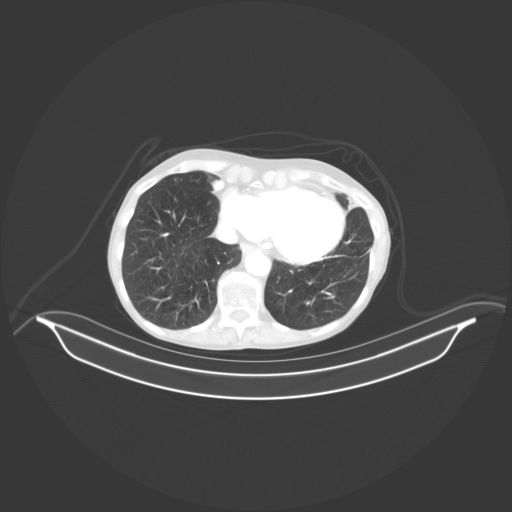

[Series 11: cor leg mpr · coronal · 0.95mm/px · 2 of 161 slices shown]
[im 41/161  soft-tissue]
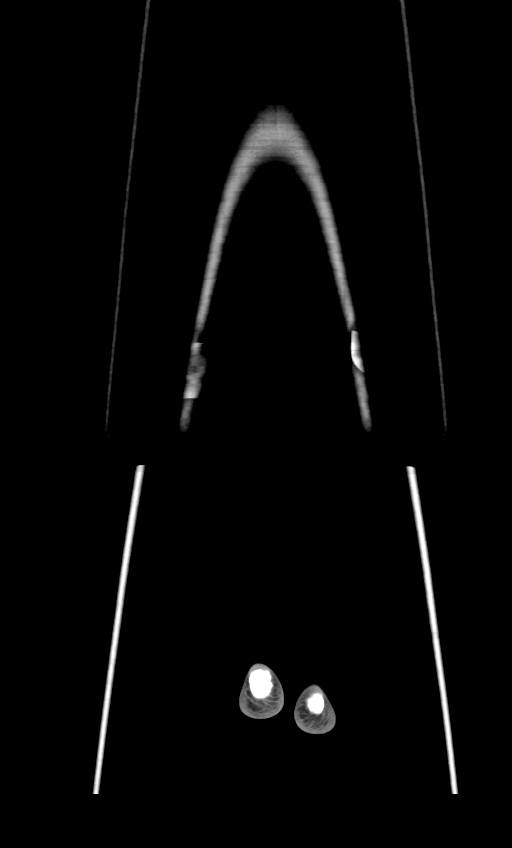
[im 81/161  soft-tissue]
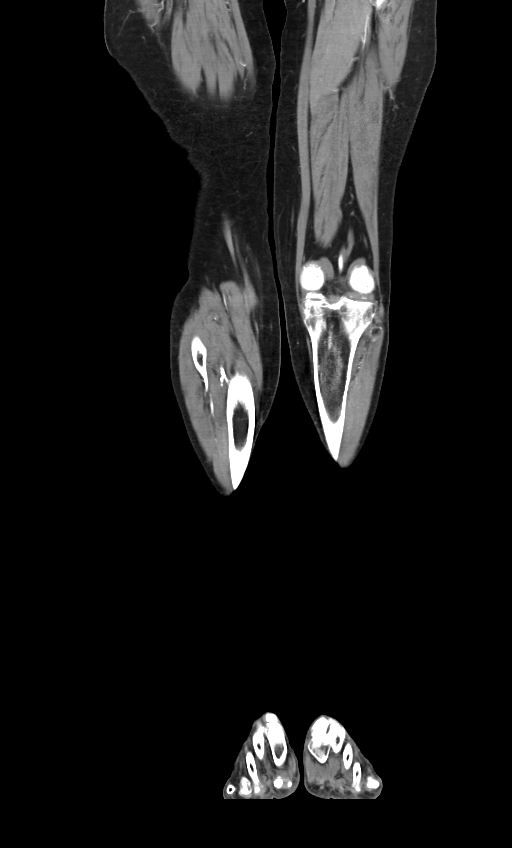

[13 of 42 positions shown; findings below may reference images not displayed]

FINDINGS: VASCULAR

Aorta: Patent and normal caliber throughout. Circumferential
fibrofatty and calcific atherosclerotic calcifications, most
prominent distally. Patent indwelling distal infrarenal aortic
stent.

Celiac: Severe ostial stenosis secondary to atherosclerotic plaque.
Mild poststenotic ectasia. Patent distally.

SMA: Patent ostium with an approximately 7 cm segment of prominent
atherosclerotic plaque resulting in multifocal mild to severe
stenoses. Patent distal jejunal and colonic branches.

Renals: Single bilateral renal arteries are patent. At least mild
proximal left renal artery stenosis secondary to atherosclerotic
plaque.

IMA: Ostial occlusion secondary to atherosclerotic plaque. Distal
reconstitution.

RIGHT Lower Extremity

Inflow: Circumferential fibrofatty and calcific atherosclerotic
plaque about the common iliac artery with at least moderate focal
stenosis in its midportion. The internal iliac artery is heavily
calcified but appears patent. Diminutive but patent external iliac
artery with scattered atherosclerotic calcifications, likely
multiple at least mild stenoses.

Outflow: Common femoral artery and profunda are patent. The
superficial femoral artery is patent in its proximal 4 cm followed
by chronic total occlusion to the level of Hunter's canal. There is
distal reconstitution of the patent popliteal artery.

Runoff: Patent three vessel runoff to the ankle.

LEFT Lower Extremity

Inflow: The common iliac artery is patent with patent indwelling
stent in unchanged position. Circumferential atherosclerotic
calcifications. Severe ostial stenosis of the internal iliac artery
which is patent distally. Severe focal stenosis about the proximal
external iliac artery which is patent but diminutive distally with
circumferential atherosclerotic calcifications.

Outflow: Common femoral artery is patent with multifocal at least
mild stenosis secondary to atherosclerotic plaque. The profundus
patent. Diffuse calcifications throughout the superficial femoral
artery resulting in at least mild multifocal stenoses to the level
of Hunter's canal. The popliteal artery is patent.

Runoff: Patent three vessel runoff to the ankle.

Veins: No obvious venous abnormality within the limitations of this
arterial phase study.

Review of the MIP images confirms the above findings.

NON-VASCULAR

Lower chest: Unchanged left lower lobe solid pulmonary nodule
measuring up to 7 mm. Similar appearing right middle and lingular
subpleural scarring. Mild global cardiomegaly. No pericardial
effusion.

Hepatobiliary: No focal liver abnormality is seen. No gallstones,
gallbladder wall thickening, or biliary dilatation.

Pancreas: Unremarkable. No pancreatic ductal dilatation or
surrounding inflammatory changes.

Spleen: Normal in size without focal abnormality.

Adrenals/Urinary Tract: Similar appearing diffuse thickening of the
left adrenal gland. The right adrenal gland is within normal
limits. Kidneys are normal, without renal calculi, focal lesion, or
hydronephrosis. Bladder is unremarkable.

Stomach/Bowel: Stomach is within normal limits. Appendix is not
definitively visualized. No evidence of bowel wall thickening,
distention, or inflammatory changes.

Lymphatic: No abdominopelvic lymphadenopathy.

Reproductive: Uterus and bilateral adnexa are unremarkable.

Other: No abdominal wall hernia or abnormality. No abdominopelvic
ascites.

Musculoskeletal: Multilevel discogenic degenerative changes of the
lumbar spine, most pronounced at L4-L5 and L5-S1. Osteopenia. No
acute osseous abnormality.
IMPRESSION: VASCULAR

1. Patent indwelling distal aortic and left common iliac artery
stents. Aortic Atherosclerosis ([id number]-YIX.X).
2. Chronic total occlusion of the right superficial femoral artery
from approximately 4 cm from its origin to the level of Hunter's
canal.
3. Atherosclerotic plaque about the bifurcation of the distal left
common iliac artery resulting in severe stenosis of the proximal
left internal and external iliac arteries, each patent distally but
diminutive with circumferential atherosclerotic calcifications.
4. High degree of atherosclerotic burden and likely at least mild
multifocal stenoses of the left lower extremity outflow vessels.
5. Patent 3 vessel bilateral lower extremity runoff.

NON-VASCULAR

No acute abdominopelvic abnormality.

## 2021-09-22 ENCOUNTER — Ambulatory Visit (INDEPENDENT_AMBULATORY_CARE_PROVIDER_SITE_OTHER)
Admission: RE | Admit: 2021-09-22 | Discharge: 2021-09-22 | Disposition: A | Discharge status: Home / Self Care | Payer: Medicare Other | Source: Ambulatory Visit | Attending: Nurse Practitioner | Admitting: Nurse Practitioner

## 2021-09-22 ENCOUNTER — Ambulatory Visit (INDEPENDENT_AMBULATORY_CARE_PROVIDER_SITE_OTHER): Payer: Medicare Other | Admitting: Nurse Practitioner

## 2021-09-22 ENCOUNTER — Telehealth: Payer: Self-pay

## 2021-09-22 VITALS — BP 122/60 | HR 88 | Temp 97.6°F | Resp 12 | Ht 64.0 in | Wt 110.5 lb

## 2021-09-22 DIAGNOSIS — K6389 Other specified diseases of intestine: Secondary | ICD-10-CM | POA: Diagnosis not present

## 2021-09-22 DIAGNOSIS — M25431 Effusion, right wrist: Secondary | ICD-10-CM | POA: Diagnosis not present

## 2021-09-22 DIAGNOSIS — K5900 Constipation, unspecified: Secondary | ICD-10-CM | POA: Diagnosis not present

## 2021-09-22 DIAGNOSIS — M25531 Pain in right wrist: Secondary | ICD-10-CM | POA: Diagnosis not present

## 2021-09-22 DIAGNOSIS — S6991XA Unspecified injury of right wrist, hand and finger(s), initial encounter: Secondary | ICD-10-CM | POA: Diagnosis not present

## 2021-09-22 DIAGNOSIS — M1611 Unilateral primary osteoarthritis, right hip: Secondary | ICD-10-CM | POA: Diagnosis not present

## 2021-09-22 DIAGNOSIS — K56609 Unspecified intestinal obstruction, unspecified as to partial versus complete obstruction: Secondary | ICD-10-CM | POA: Diagnosis not present

## 2021-09-22 DIAGNOSIS — M19031 Primary osteoarthritis, right wrist: Secondary | ICD-10-CM | POA: Diagnosis not present

## 2021-09-22 LAB — BASIC METABOLIC PANEL
BUN: 16 mg/dL (ref 6–23)
CO2: 31 mEq/L (ref 19–32)
Calcium: 9.4 mg/dL (ref 8.4–10.5)
Chloride: 97 mEq/L (ref 96–112)
Creatinine, Ser: 0.96 mg/dL (ref 0.40–1.20)
GFR: 54.01 mL/min — ABNORMAL LOW (ref >60.00)
Glucose, Bld: 117 mg/dL — ABNORMAL HIGH (ref 70–99)
Potassium: 3.5 mEq/L (ref 3.5–5.1)
Sodium: 137 mEq/L (ref 135–145)

## 2021-09-22 LAB — URIC ACID: Uric Acid, Serum: 4.4 mg/dL (ref 2.4–7.0)

## 2021-09-22 MED ORDER — DICLOFENAC SODIUM 1 % EX GEL: 2.0000 g | Freq: Four times a day (QID) | CUTANEOUS | 0 refills | Status: DC

## 2021-09-22 NOTE — Telephone Encounter (Signed)
Jill Franco, patient was in for acute visit with [facility] and asked to speak with you but you were with a patient. I advised patient I would pass along the information and papers she brought in and will have you reach out to her to see if she needs to come in to speak with you or able to do phone call.  She received letter from insurance asking her to switch over Metoprolol, Alendronate and Rosuvastatin to [employer] order. Patient is concerned about switching Metoprolol because she takes it as needed when b/p is elevated and she has like 300 tablets.  She also received a letter from [employer] stating Symbicort will no longer be available through the program effective 02/20/2022.  I placed copies of these letters in your folder up front. Thank you

## 2021-09-22 NOTE — Patient Instructions (Signed)
Nice to see you today I will be in touch with the labs and xray once I have them You can use voltaren gel on the wrist, I sent in a script. Magnesium citrate is an option to get your bowels to moving. It can cause cramping and diarrhea. You need to stay hydrated while using it

## 2021-09-22 NOTE — Assessment & Plan Note (Signed)
Distant with constipation.  No fecal impaction felt on exam today.  Did encourage patient to drink plenty of fluid and she can try magnesium citrate over-the-counter if she would like.  Gave strict precautions and warning in regards to cramping, abdominal pain, and diarrhea.  She experienced diarrhea she needs to stay hydrated to prevent an AKI.

## 2021-09-22 NOTE — Progress Notes (Signed)
Acute Office Visit  Subjective:     Patient ID: Jill Franco, female    DOB: 03-01-1934, 87 y.o.   MRN: [id number]  Chief Complaint  Patient presents with   Constipation    She has been without bowel movement x 5 days, usually goes every other day. She took Dulcolax 2 tablets once daily for 2 days, Laxative for 3 days and Miralax. No bowel movement still. Gets abdominal pain in the lower part off and on and feels like she needs to use the bathroom but nothing happens. No nausea.     Patient is in today for Constipation  States that she has not had a Bm for 5 days. States that the one before was a hard BM that hurt and took time to get out. States that she took dulcolax 2 pills at night for two nights and miralax for 3 days. States that she is passing gas, mostly at night. No surgery on the abdomn, no SBO. States that she had a colonscopy 2021, which I reviewed in office.  States that she will have intermittent cramping in the lower abdomen that is described as pain   Patient also mentioned that her right wrist and hand is swelling and tender.  History of shingles on the same side concern is coming back as it is itching and some tingling.  No known injury per patient report besides she was trying to place her seatbelt on the other day and use the hand to push down and quick did not hear any breaks, snaps, or pops.  She does have a history of osteoporosis which she is being treated for currently with alendronate  Review of Systems  Constitutional:  Negative for chills and fever.  Respiratory:  Negative for shortness of breath.   Cardiovascular:  Negative for chest pain.  Gastrointestinal:  Positive for abdominal pain and constipation. Negative for diarrhea, nausea and vomiting.  Genitourinary:  Negative for dysuria and hematuria.        Objective:    BP 122/60   Pulse 88   Temp 97.6 F (36.4 C)   Resp 12   Ht '5\' 4"'$  (1.626 m)   Wt 110 lb 8 oz (50.1 kg)   SpO2 96%   BMI  18.97 kg/m    Physical Exam Vitals and nursing note reviewed.  Constitutional:      Appearance: Normal appearance.  Cardiovascular:     Rate and Rhythm: Normal rate and regular rhythm.     Heart sounds: Normal heart sounds.  Pulmonary:     Effort: Pulmonary effort is normal.  Abdominal:     General: Bowel sounds are normal. There is distension.     Palpations: Abdomen is soft. There is no mass.     Tenderness: There is no abdominal tenderness.     Hernia: No hernia is present.  Genitourinary:    Rectum: No mass, tenderness or anal fissure.     Comments: No impaction appreciated.  There was stool in the rectal vault that did feel hard Musculoskeletal:       Arms:     Comments: Erythema and warmth.  Nontender to palpation no apparent rash appreciated  Neurological:     Mental Status: She is alert.     No results found for any visits on 09/22/21.      Assessment & Plan:   Problem List Items Addressed This Visit       Other   Pain and swelling of right wrist  Bilious in nature no history of gout per patient report.  Pending x-ray in office continue to monitor patient can use Voltaren gel as needed prescription sent in      Relevant Medications   diclofenac Sodium (VOLTAREN) 1 % GEL   Other Relevant Orders   DG Wrist Complete Right   Uric acid   Basic metabolic panel   Constipation - Primary    Distant with constipation.  No fecal impaction felt on exam today.  Did encourage patient to drink plenty of fluid and she can try magnesium citrate over-the-counter if she would like.  Gave strict precautions and warning in regards to cramping, abdominal pain, and diarrhea.  She experienced diarrhea she needs to stay hydrated to prevent an AKI.      Relevant Orders   DG Abd 2 Views   Basic metabolic panel    Meds ordered this encounter  Medications   diclofenac Sodium (VOLTAREN) 1 % GEL    Sig: Apply 2 g topically 4 (four) times daily.    Dispense:  50 g    Refill:   0    Order Specific Question:   Supervising Provider    Answer:   TOWER, MARNE A [[id number]]    Return if symptoms worsen or fail to improve.  Romilda Garret, NP

## 2021-09-22 NOTE — Assessment & Plan Note (Signed)
Bilious in nature no history of gout per patient report.  Pending x-ray in office continue to monitor patient can use Voltaren gel as needed prescription sent in

## 2021-09-23 ENCOUNTER — Telehealth: Payer: Self-pay

## 2021-09-23 ENCOUNTER — Telehealth: Payer: Self-pay | Admitting: Nurse Practitioner

## 2021-09-23 ENCOUNTER — Other Ambulatory Visit: Payer: Self-pay | Admitting: Family Medicine

## 2021-09-23 NOTE — Telephone Encounter (Signed)
Patient advised and verbalized understanding 

## 2021-09-23 NOTE — Telephone Encounter (Signed)
-----   Message from [place] sent at 09/23/2021  2:17 PM EDT ----- Spoke with patient. Patient did not talk to anyone yesterday or today until I called. Medium sized bowel movement. No blood in the stool and it was not dark color. No more since then. Discomfort is much better today. Patient got magnesium Citrate liquid yesterday and then her daughter found the tablet version today. Which should she try or if she even still needs to? And if to do liquid how much of it to drink it was not on the bottle.

## 2021-09-23 NOTE — Telephone Encounter (Signed)
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 Patient Name Letishia Elliott Patient DOB March 13, 1934 Requesting Provider [facility] Physician Number [place] Name [facility] radiology Room Number outpatient [place]. Time Disposition Final User 09/22/2021 6:45:27 PM Send to [place], [place] 09/22/2021 6:50:49 PM Called On-Call Provider Kelly Splinter 09/22/2021 6:51:23 PM Page Completed Yes Kelly Splinter Paging DoctorName Phone DateTime Result/Outcome Message Type Notes Deborra Medina - MD [phone] 09/22/2021 6:50:49 PM Called On Call Provider - Reached Doctor Paged Deborra Medina - MD 09/22/2021 6:51:16 PM Spoke with On Call - General Message Result connected to radiology. Call Closed By: Kelly Splinter Transaction Date/Time: 09/22/2021 6:42:08 PM (ET    Sending note to Romilda Garret NP and Azalee Course CMA and will send teams to [place].

## 2021-09-23 NOTE — Telephone Encounter (Signed)
Since she is feeling ok and had a BM. I did talk with Dr. Damita Dunnings since she had a BM to continue with the mira lax. If she gets to where she does not have a BM again she has the magnesium products. If she gets worse, becomes nauseous, start vomiting or has abdominal pain she need to let us know or be seen in the emergency department

## 2021-09-23 NOTE — Telephone Encounter (Signed)
We are reaching out to patient

## 2021-09-24 MED ORDER — ALENDRONATE SODIUM 70 MG PO TABS: 70.0000 mg | ORAL_TABLET | ORAL | 1 refills | Status: DC

## 2021-09-24 MED ORDER — ROSUVASTATIN CALCIUM 20 MG PO TABS: 20.0000 mg | ORAL_TABLET | Freq: Every day | ORAL | 1 refills | Status: DC

## 2021-09-24 NOTE — Telephone Encounter (Signed)
Spoke with patient. She would like rosuvastatin and alendronate sent to mail order. We will not transfer metoprolol since she just takes this PRN.   Discussed Symbicort/[employer] - she will continue to get Symbicort from manufacturer through the end of the year, in January we will have to see if generic is more affordable through insurance of if we need to switch to another inhaler. Will follow up with patient in January to address.

## 2021-09-24 NOTE — Addendum Note (Signed)
Addended by: Charlton Haws on: 09/24/2021 01:49 PM   Modules accepted: Orders

## 2021-09-28 ENCOUNTER — Ambulatory Visit (INDEPENDENT_AMBULATORY_CARE_PROVIDER_SITE_OTHER): Payer: Medicare Other

## 2021-09-28 ENCOUNTER — Other Ambulatory Visit: Payer: Self-pay | Admitting: Family Medicine

## 2021-09-28 VITALS — Wt 110.0 lb

## 2021-09-28 DIAGNOSIS — Z0000 Encounter for general adult medical examination without abnormal findings: Secondary | ICD-10-CM

## 2021-09-28 NOTE — Patient Instructions (Signed)
Jill Franco , Thank you for taking time to come for your Medicare Wellness Visit. I appreciate your ongoing commitment to your health goals. Please review the following plan we discussed and let me know if I can assist you in the future.   Screening recommendations/referrals: Colonoscopy: aged out Mammogram: aged out Bone Density: aged out Recommended yearly ophthalmology/optometry visit for glaucoma screening and checkup Recommended yearly dental visit for hygiene and checkup  Vaccinations: Influenza vaccine: 10/22/20 Pneumococcal vaccine: 08/04/14 Tdap vaccine: 05/31/10, due if have an injury Shingles vaccine: Zostavax 06/22/10   Shingrix 02/22/19, 04/22/19   Covid-19:04/21/19, 05/21/19, 02/07/20  Advanced directives: no  Conditions/risks identified: none  Next appointment: Follow up in one year for your annual wellness visit - 09/30/22 @ 9:30 am by phone   Preventive Care 85 Years and Older, Female Preventive care refers to lifestyle choices and visits with your health care provider that can promote health and wellness. What does preventive care include? A yearly physical exam. This is also called an annual well check. Dental exams once or twice a year. Routine eye exams. Ask your health care provider how often you should have your eyes checked. Personal lifestyle choices, including: Daily care of your teeth and gums. Regular physical activity. Eating a healthy diet. Avoiding tobacco and drug use. Limiting alcohol use. Practicing safe sex. Taking low-dose aspirin every day. Taking vitamin and mineral supplements as recommended by your health care provider. What happens during an annual well check? The services and screenings done by your health care provider during your annual well check will depend on your age, overall health, lifestyle risk factors, and family history of disease. Counseling  Your health care provider may ask you questions about your: Alcohol use. Tobacco use. Drug  use. Emotional well-being. Home and relationship well-being. Sexual activity. Eating habits. History of falls. Memory and ability to understand (cognition). Work and work Statistician. Reproductive health. Screening  You may have the following tests or measurements: Height, weight, and BMI. Blood pressure. Lipid and cholesterol levels. These may be checked every 5 years, or more frequently if you are over 75 years old. Skin check. Lung cancer screening. You may have this screening every year starting at age 85 if you have a 30-pack-year history of smoking and currently smoke or have quit within the past 15 years. Fecal occult blood test (FOBT) of the stool. You may have this test every year starting at age 85. Flexible sigmoidoscopy or colonoscopy. You may have a sigmoidoscopy every 5 years or a colonoscopy every 10 years starting at age 85. Hepatitis C blood test. Hepatitis B blood test. Sexually transmitted disease (STD) testing. Diabetes screening. This is done by checking your blood sugar (glucose) after you have not eaten for a while (fasting). You may have this done every 1-3 years. Bone density scan. This is done to screen for osteoporosis. You may have this done starting at age 85. Mammogram. This may be done every 1-2 years. Talk to your health care provider about how often you should have regular mammograms. Talk with your health care provider about your test results, treatment options, and if necessary, the need for more tests. Vaccines  Your health care provider may recommend certain vaccines, such as: Influenza vaccine. This is recommended every year. Tetanus, diphtheria, and acellular pertussis (Tdap, Td) vaccine. You may need a Td booster every 10 years. Zoster vaccine. You may need this after age 85. Pneumococcal 13-valent conjugate (PCV13) vaccine. One dose is recommended after age 85. Pneumococcal  polysaccharide (PPSV23) vaccine. One dose is recommended after age  85. Talk to your health care provider about which screenings and vaccines you need and how often you need them. This information is not intended to replace advice given to you by your health care provider. Make sure you discuss any questions you have with your health care provider. Document Released: 03/06/2015 Document Revised: 10/28/2015 Document Reviewed: 12/09/2014 Elsevier Interactive Patient Education  2017 [employer] Prevention in the Home Falls can cause injuries. They can happen to people of all ages. There are many things you can do to make your home safe and to help prevent falls. What can I do on the outside of my home? Regularly fix the edges of walkways and driveways and fix any cracks. Remove anything that might make you trip as you walk through a door, such as a raised step or threshold. Trim any bushes or trees on the path to your home. Use bright outdoor lighting. Clear any walking paths of anything that might make someone trip, such as rocks or tools. Regularly check to see if handrails are loose or broken. Make sure that both sides of any steps have handrails. Any raised decks and porches should have guardrails on the edges. Have any leaves, snow, or ice cleared regularly. Use sand or salt on walking paths during winter. Clean up any spills in your garage right away. This includes oil or grease spills. What can I do in the bathroom? Use night lights. Install grab bars by the toilet and in the tub and shower. Do not use towel bars as grab bars. Use non-skid mats or decals in the tub or shower. If you need to sit down in the shower, use a plastic, non-slip stool. Keep the floor dry. Clean up any water that spills on the floor as soon as it happens. Remove soap buildup in the tub or shower regularly. Attach bath mats securely with double-sided non-slip rug tape. Do not have throw rugs and other things on the floor that can make you trip. What can I do in the  bedroom? Use night lights. Make sure that you have a light by your bed that is easy to reach. Do not use any sheets or blankets that are too big for your bed. They should not hang down onto the floor. Have a firm chair that has side arms. You can use this for support while you get dressed. Do not have throw rugs and other things on the floor that can make you trip. What can I do in the kitchen? Clean up any spills right away. Avoid walking on wet floors. Keep items that you use a lot in easy-to-reach places. If you need to reach something above you, use a strong step stool that has a grab bar. Keep electrical cords out of the way. Do not use floor polish or wax that makes floors slippery. If you must use wax, use non-skid floor wax. Do not have throw rugs and other things on the floor that can make you trip. What can I do with my stairs? Do not leave any items on the stairs. Make sure that there are handrails on both sides of the stairs and use them. Fix handrails that are broken or loose. Make sure that handrails are as long as the stairways. Check any carpeting to make sure that it is firmly attached to the stairs. Fix any carpet that is loose or worn. Avoid having throw rugs at the top  or bottom of the stairs. If you do have throw rugs, attach them to the floor with carpet tape. Make sure that you have a light switch at the top of the stairs and the bottom of the stairs. If you do not have them, ask someone to add them for you. What else can I do to help prevent falls? Wear shoes that: Do not have high heels. Have rubber bottoms. Are comfortable and fit you well. Are closed at the toe. Do not wear sandals. If you use a stepladder: Make sure that it is fully opened. Do not climb a closed stepladder. Make sure that both sides of the stepladder are locked into place. Ask someone to hold it for you, if possible. Clearly mark and make sure that you can see: Any grab bars or  handrails. First and last steps. Where the edge of each step is. Use tools that help you move around (mobility aids) if they are needed. These include: Canes. Walkers. Scooters. Crutches. Turn on the lights when you go into a dark area. Replace any light bulbs as soon as they burn out. Set up your furniture so you have a clear path. Avoid moving your furniture around. If any of your floors are uneven, fix them. If there are any pets around you, be aware of where they are. Review your medicines with your doctor. Some medicines can make you feel dizzy. This can increase your chance of falling. Ask your doctor what other things that you can do to help prevent falls. This information is not intended to replace advice given to you by your health care provider. Make sure you discuss any questions you have with your health care provider. Document Released: 12/04/2008 Document Revised: 07/16/2015 Document Reviewed: 03/14/2014 Elsevier Interactive Patient Education  2017 [employer].

## 2021-09-28 NOTE — Progress Notes (Signed)
Virtual Visit via Telephone Note  I connected with  Jill Franco on 09/28/21 at  9:30 AM EDT by telephone and verified that I am speaking with the correct person using two identifiers.  Location: Patient: home Provider: [place] Persons participating in the virtual visit: [place]   I discussed the limitations, risks, security and privacy concerns of performing an evaluation and management service by telephone and the availability of in person appointments. The patient expressed understanding and agreed to proceed.  Interactive audio and video telecommunications were attempted between this nurse and patient, however failed, due to patient having technical difficulties OR patient did not have access to video capability.  We continued and completed visit with audio only.  Some vital signs may be absent or patient reported.   Dionisio David, LPN  Subjective:   Jill Franco is a 87 y.o. female who presents for Medicare Annual (Subsequent) preventive examination.  Review of Systems           Objective:    There were no vitals filed for this visit. There is no height or weight on file to calculate BMI.     04/30/2021    7:32 AM 12/03/2020    6:20 PM 11/25/2020   10:07 AM 06/05/2020    7:43 PM 04/26/2019    3:02 PM 04/05/2019    9:00 PM 04/05/2019    2:57 PM  Advanced Directives  Does Patient Have a Medical Advance Directive? Yes Yes No No Yes Yes Yes  Type of Advance Directive Healthcare Power of [place] will   Healthcare Power of [place]   Does patient want to make changes to medical advance directive?  No - Patient declined    No - Patient declined   Copy of [place] in Chart?      No - copy requested   Would patient like information on creating a medical advance directive?  No - Patient declined No - Patient declined No - Patient declined       Current Medications  (verified) Outpatient Encounter Medications as of 09/28/2021  Medication Sig   albuterol (PROAIR HFA) 108 (90 Base) MCG/ACT inhaler Inhale 2 puffs into the lungs every 6 (six) hours as needed for wheezing or shortness of breath.   alendronate (FOSAMAX) 70 MG tablet Take 1 tablet (70 mg total) by mouth every 7 (seven) days. Take with a full glass of water on an empty stomach.   aspirin EC 81 MG tablet Take 1 tablet (81 mg total) by mouth daily. Swallow whole.   budesonide-formoterol (SYMBICORT) 160-4.5 MCG/ACT inhaler Inhale 2 puffs into the lungs in the morning and at bedtime.   Cholecalciferol (VITAMIN D3) 25 MCG (1000 UT) CAPS Take 1 capsule (1,000 Units total) by mouth daily.   diclofenac Sodium (VOLTAREN) 1 % GEL Apply 2 g topically 4 (four) times daily.   furosemide (LASIX) 20 MG tablet TAKE 2 TABLETS BY MOUTH IN THE  MORNING   levothyroxine (SYNTHROID) 50 MCG tablet TAKE 1 TABLET BY MOUTH  DAILY EXCEPT 2 TABLETS ON  SUNDAYS AND WEDNESDAYS (  TOTAL 9 TABS PER WEEK)   melatonin 5 MG TABS Take 5 mg by mouth at bedtime as needed.   metoprolol tartrate (LOPRESSOR) 100 MG tablet 1/2 tab ('50mg'$ ) daily if needed if SBP is above 120.   rivaroxaban (XARELTO) 2.5 MG TABS tablet Take 1 tablet (2.5 mg total) by mouth 2 (two) times daily.  rosuvastatin (CRESTOR) 20 MG tablet Take 1 tablet (20 mg total) by mouth daily.   tiZANidine (ZANAFLEX) 4 MG tablet Take 0.5-1 tablets (2-4 mg total) by mouth 2 (two) times daily as needed for muscle spasms (sedation caution).   No facility-administered encounter medications on file as of 09/28/2021.    Allergies (verified) Aspirin, Codeine, Ibuprofen, Lipitor [atorvastatin], and Other   History: Past Medical History:  Diagnosis Date   Cataract    bilaterally corrected.   COPD (chronic obstructive pulmonary disease) ([place])    CVA (cerebral vascular accident) ([place])    Pt was unaware and does not know when it happened.  Was told after some testing was done.    Double vision    Dyspnea    Facial droop 10/2017   left   History of shingles 2012   HLD (hyperlipidemia)    HTN (hypertension)    Hypothyroidism    PVD (peripheral vascular disease) ([place])    Vision loss    temporary   Past Surgical History:  Procedure Laterality Date   ABDOMINAL AORTOGRAM N/A 04/30/2021   Procedure: ABDOMINAL AORTOGRAM;  Surgeon: [name], MD;  Location: [facility];  Service: Cardiovascular;  Laterality: N/A;   ABDOMINAL EXPLORATION SURGERY  1950s   "they thought I was pregnant; went in to explore"   APPENDECTOMY  childhood   BACK SURGERY     BREAST LUMPECTOMY Left 1960's   benign   carotid ultrasound  06/19/2007   0-39% stenosis   CATARACT EXTRACTION W/ INTRAOCULAR LENS IMPLANT Left 09/2016   CATARACT EXTRACTION W/ INTRAOCULAR LENS IMPLANT Right 10/2016   COLONOSCOPY WITH PROPOFOL N/A 04/08/2019   Procedure: COLONOSCOPY WITH PROPOFOL;  Surgeon: Milus Banister, MD;  Location: [facility];  Service: Endoscopy;  Laterality: N/A;   DILATION AND CURETTAGE OF UTERUS  1960's X 3   miscarriages   DOPPLER ECHOCARDIOGRAPHY  06/19/2007   Normal EF 55-70%   ENDARTERECTOMY FEMORAL Left 12/03/2020   Procedure: LEFT FEMORAL ENDARTERECTOMY AND PROFUNDAPLASTY;  Surgeon: [name], MD;  Location: [facility];  Service: Vascular;  Laterality: Left;   ILIAC ARTERY STENT Left 03/18/2016   common iliac stent (6 x 40), aortic stent (10 x 19)/notes 03/18/2016   INSERTION OF ILIAC STENT Left 12/03/2020   Procedure: AORTIC STENTING, INSERTION OF BILATERAL COMMON ILIAC STENTS, AND LEFT EXTERNAL ILIAC STENT;  Surgeon: [name], MD;  Location: [facility];  Service: Vascular;  Laterality: Left;   LAMINECTOMY AND MICRODISCECTOMY SPINE  1960's X 2   LOWER EXTREMITY ANGIOGRAM N/A 12/03/2020   Procedure: Andris Baumann;  Surgeon: [name], MD;  Location: [facility];  Service: Vascular;  Laterality: N/A;   MOLE REMOVAL  07/10/2016   NSVD     x1   PATCH  ANGIOPLASTY Left 12/03/2020   Procedure: PATCH ANGIOPLASTY OF LEFT COMMON FEMORAL ARTERY USING BOVINE PERICARDIUM PATCH;  Surgeon: [name], MD;  Location: [facility];  Service: Vascular;  Laterality: Left;   PERIPHERAL VASCULAR BALLOON ANGIOPLASTY Right 04/30/2021   Procedure: PERIPHERAL VASCULAR BALLOON ANGIOPLASTY;  Surgeon: [name], MD;  Location: [facility];  Service: Cardiovascular;  Laterality: Right;  rt common iliac   PERIPHERAL VASCULAR CATHETERIZATION N/A 03/18/2016   Procedure: Abdominal Aortogram w/Lower Extremity;  Surgeon: Elam Dutch, MD;  Location: [facility];  Service: Cardiovascular;  Laterality: N/A;   PERIPHERAL VASCULAR CATHETERIZATION  03/18/2016   Procedure: Peripheral Vascular Intervention;  Surgeon: Elam Dutch, MD;  Location: [facility];  Service: Cardiovascular;;   PERIPHERAL VASCULAR INTERVENTION Left 04/30/2021   Procedure: PERIPHERAL VASCULAR INTERVENTION;  Surgeon: [name], MD;  Location: [facility];  Service: Cardiovascular;  Laterality: Left;  lt common iliac   POLYPECTOMY  04/08/2019   Procedure: POLYPECTOMY;  Surgeon: Milus Banister, MD;  [facility];  Service: Endoscopy;;   ULTRASOUND GUIDANCE FOR VASCULAR ACCESS Right 12/03/2020   Procedure: ULTRASOUND GUIDANCE FOR VASCULAR ACCESS, RIGHT FEMORAL ARTERY;  Surgeon: [name], MD;  [facility];  Service: Vascular;  Laterality: Right;   Family History  Problem Relation Age of Onset   Stroke Father 83   Cancer Brother    Colon cancer Neg Hx    Breast cancer Neg Hx    Colon polyps Neg Hx    Esophageal cancer Neg Hx    Rectal cancer Neg Hx    Stomach cancer Neg Hx    Social History   Socioeconomic History   Marital status: Widowed    Spouse name: Not on file   Number of children: 1   Years of education: Not on file   Highest education level: Not on file  Occupational History   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    Comment:  Glass blower/designer  Tobacco Use   Smoking status: Former    Packs/day: 0.25    Years: 69.00    Total pack years: 17.25    Types: Cigarettes    Quit date: 01/2021    Years since quitting: 0.6    Passive exposure: Current   Smokeless tobacco: Never  Vaping Use   Vaping Use: Never used  Substance and Sexual Activity   Alcohol use: No    Alcohol/week: 0.0 standard drinks of alcohol   Drug use: No   Sexual activity: Not on file  Other Topics Concern   Not on file  Social History Narrative   From [place]   Retired 2007   Widowed 2008 after 72 years   Enjoys yard work, but needs some help with yard work now.     Her daughter is helping at home some (some as of 2022).  Her daughter has vision loss and is widowed as of 2022 and moved in with patient.   Social Determinants of Health   Financial Resource Strain: Medium Risk (05/11/2021)   Overall Financial Resource Strain (CARDIA)    Difficulty of Paying Living Expenses: Somewhat hard  Food Insecurity: No Food Insecurity (05/11/2021)   Hunger Vital Sign    Worried About Running Out of Food in the Last Year: Never true    Ran Out of Food in the Last Year: Never true  Transportation Needs: Not on file  Physical Activity: Insufficiently Active (09/28/2021)   Exercise Vital Sign    Days of Exercise per Week: 3 days    Minutes of Exercise per Session: 30 min  Stress: No Stress Concern Present (09/28/2021)   [place]    Feeling of Stress : Not at all  Social Connections: Not on file    Tobacco Counseling Counseling given: Not Answered   Clinical Intake:  Pre-visit preparation completed: Yes  Pain : No/denies pain     Nutritional Risks: None Diabetes: No  How often do you need to have someone help you when you read instructions, pamphlets, or other written materials from your doctor or pharmacy?: 1 - Never  Diabetic?no  Interpreter Needed?:  No  Information entered  by :: Kirke Shaggy, LPN   Activities of Daily Living    12/03/2020    6:20 PM 12/03/2020    6:18 PM  In your present state of health, do you have any difficulty performing the following activities:  Hearing?  1  Comment  difficulty hearing in R ear  Vision?  0  Difficulty concentrating or making decisions?  1  Comment  remembering  Walking or climbing stairs?  1  Comment  "only since my legs has go bad"  Dressing or bathing?  0  Doing errands, shopping? 1   Comment since legs started hurting     Patient Care Team: Tonia Ghent, MD as PCP - General (Family Medicine) Marica Otter, [place] as Referring Physician (Optometry) Charlton Haws, [facility] as Pharmacist (Pharmacist)  Indicate any recent Medical Services you may have received from other than [facility] providers in the past year (date may be approximate).     Assessment:   This is a routine wellness examination for Khal[place].  Hearing/Vision screen No results found.  Dietary issues and exercise activities discussed:     Goals Addressed             This Visit's Progress    DIET - EAT MORE FRUITS AND VEGETABLES         Depression Screen    09/28/2021    9:35 AM 09/18/2020    9:11 AM 09/12/2019   10:09 AM 08/17/2017    8:54 AM 08/04/2016    1:40 PM 07/27/2015    1:07 PM 08/04/2014    9:55 AM  PHQ 2/9 Scores  PHQ - 2 Score 0 0 0 0 0 0 0  PHQ- 9 Score 0 0  0       Fall Risk    09/18/2020    8:42 AM 09/12/2019   10:09 AM 08/17/2017    8:54 AM 08/04/2016    1:40 PM 07/27/2015    1:07 PM  Fall Risk   Falls in the past year? 0 0 No No No  Number falls in past yr: 0      Injury with Fall? 0      Risk for fall due to : No Fall Risks      Follow up Falls evaluation completed        FALL RISK PREVENTION PERTAINING TO THE HOME:  Any stairs in or around the home? No  If so, are there any without handrails? No  Home free of loose throw rugs in walkways, pet beds, electrical cords, etc? Yes   Adequate lighting in your home to reduce risk of falls? Yes   ASSISTIVE DEVICES UTILIZED TO PREVENT FALLS:  Life alert? No  Use of a cane, walker or w/c? No  Grab bars in the bathroom? Yes  Shower chair or bench in shower? Yes  Elevated toilet seat or a handicapped toilet? Yes   Cognitive Function:declined       08/17/2017    8:55 AM 08/04/2016    1:40 PM 07/27/2015    1:25 PM  MMSE - Mini Mental State Exam  Orientation to time '5 5 5  '$ Orientation to Place '5 5 5  '$ Registration '3 3 3  '$ Attention/ Calculation 0 0 0  Recall '3 3 3  '$ Language- name 2 objects 0 0 0  Language- repeat '1 1 1  '$ Language- follow 3 step command '3 3 3  '$ Language- read & follow direction 0 0 0  Write a sentence 0 0 0  Copy design 0 0 0  Total score '20 20 20        '$ Immunizations Immunization History  Administered Date(s) Administered   Fluad Quad(high Dose 65+) 11/23/2018, 11/02/2019, 10/22/2020   Influenza Split 12/06/2010   Influenza Whole 11/21/2004, 11/30/2006, 12/02/2008, 11/27/2009   Influenza,inj,Quad PF,6+ Mos 12/11/2012, 11/12/2013, 01/29/2015, 11/27/2015, 12/22/2016, 11/21/2017   PFIZER(Purple Top)SARS-COV-2 Vaccination 04/21/2019, 05/21/2019, 02/07/2020   Pneumococcal Conjugate-13 08/04/2014   Pneumococcal Polysaccharide-23 05/23/2006   Td 05/22/2004   Tdap 05/31/2010   Zoster Recombinat (Shingrix) 02/22/2019, 04/22/2019   Zoster, Live 06/11/2010    TDAP status: Due, Education has been provided regarding the importance of this vaccine. Advised may receive this vaccine at local pharmacy or Health Dept. Aware to provide a copy of the vaccination record if obtained from local pharmacy or Health Dept. Verbalized acceptance and understanding.  Flu Vaccine status: Up to date  Pneumococcal vaccine status: Up to date  Covid-19 vaccine status: Completed vaccines  Qualifies for Shingles Vaccine? Yes   Zostavax completed Yes   Shingrix Completed?: Yes  Screening Tests Health Maintenance   Topic Date Due   COVID-19 Vaccine (4 - Pfizer series) 04/03/2020   TETANUS/TDAP  05/30/2020   INFLUENZA VACCINE  09/21/2021   DEXA SCAN  07/27/2023 (Originally 05/21/2001)   Pneumonia Vaccine 61+ Years old  Completed   Zoster Vaccines- Shingrix  Completed   HPV VACCINES  Aged Out    Health Maintenance  Health Maintenance Due  Topic Date Due   COVID-19 Vaccine (4 - Pfizer series) 04/03/2020   TETANUS/TDAP  05/30/2020   INFLUENZA VACCINE  09/21/2021    Colorectal cancer screening: No longer required.   Mammogram status: No longer required due to aged.    Lung Cancer Screening: (Low Dose CT Chest recommended if Age 69-80 years, 30 pack-year currently smoking OR have quit w/in 15years.) does not qualify.   Lung Cancer Screening Referral: declined  Additional Screening:  Hepatitis C Screening: does not qualify; Completed no  Vision Screening: Recommended annual ophthalmology exams for early detection of glaucoma and other disorders of the eye. Is the patient up to date with their annual eye exam?  Yes  Who is the provider or what is the name of the office in which the patient attends annual eye exams? Dr.Miller If pt is not established with a provider, would they like to be referred to a provider to establish care? No .   Dental Screening: Recommended annual dental exams for proper oral hygiene  Community Resource Referral / Chronic Care Management: CRR required this visit?  No   CCM required this visit?  No      Plan:     I have personally reviewed and noted the following in the patient's chart:   Medical and social history Use of alcohol, tobacco or illicit drugs  Current medications and supplements including opioid prescriptions.  Functional ability and status Nutritional status Physical activity Advanced directives List of other physicians Hospitalizations, surgeries, and ER visits in previous 12 months Vitals Screenings to include cognitive, depression,  and falls Referrals and appointments  In addition, I have reviewed and discussed with patient certain preventive protocols, quality metrics, and best practice recommendations. A written personalized care plan for preventive services as well as general preventive health recommendations were provided to patient.     Dionisio David, LPN   03/26/3005   Nurse Notes: none

## 2021-10-02 ENCOUNTER — Other Ambulatory Visit: Payer: Self-pay | Admitting: Family Medicine

## 2021-10-06 NOTE — Progress Notes (Signed)
Virtual Visit via Telephone Note  I connected with  Jill Franco on 10/06/21 at  9:30 AM EDT by telephone and verified that I am speaking with the correct person using two identifiers.  Location: Patient: home Provider: [place] Persons participating in the virtual visit: [place]   I discussed the limitations, risks, security and privacy concerns of performing an evaluation and management service by telephone and the availability of in person appointments. The patient expressed understanding and agreed to proceed.  Interactive audio and video telecommunications were attempted between this nurse and patient, however failed, due to patient having technical difficulties OR patient did not have access to video capability.  We continued and completed visit with audio only.  Some vital signs may be absent or patient reported.   Dionisio David, LPN  Subjective:   Jill Franco is a 87 y.o. female who presents for Medicare Annual (Subsequent) preventive examination.  Review of Systems     Cardiac Risk Factors include: advanced age (>84mn, >>49women);hypertension;smoking/ tobacco exposure;dyslipidemia     Objective:    Today's Vitals   09/28/21 0945  Weight: 110 lb (49.9 kg)   Body mass index is 18.88 kg/m.     09/28/2021    9:37 AM 04/30/2021    7:32 AM 12/03/2020    6:20 PM 11/25/2020   10:07 AM 06/05/2020    7:43 PM 04/26/2019    3:02 PM 04/05/2019    9:00 PM  Advanced Directives  Does Patient Have a Medical Advance Directive? No Yes Yes No No Yes Yes  Type of Advance Directive  Healthcare Power of [place]   Healthcare Power of AV[place] Does patient want to make changes to medical advance directive?   No - Patient declined    No - Patient declined  Copy of [place] Chart?       No - copy requested  Would patient like information on creating a medical advance directive? No -  Patient declined  No - Patient declined No - Patient declined No - Patient declined      Current Medications (verified) Outpatient Encounter Medications as of 09/28/2021  Medication Sig   alendronate (FOSAMAX) 70 MG tablet Take 1 tablet (70 mg total) by mouth every 7 (seven) days. Take with a full glass of water on an empty stomach.   aspirin EC 81 MG tablet Take 1 tablet (81 mg total) by mouth daily. Swallow whole.   budesonide-formoterol (SYMBICORT) 160-4.5 MCG/ACT inhaler Inhale 2 puffs into the lungs in the morning and at bedtime.   Cholecalciferol (VITAMIN D3) 25 MCG (1000 UT) CAPS Take 1 capsule (1,000 Units total) by mouth daily.   diclofenac Sodium (VOLTAREN) 1 % GEL Apply 2 g topically 4 (four) times daily.   furosemide (LASIX) 20 MG tablet TAKE 2 TABLETS BY MOUTH IN THE  MORNING   levothyroxine (SYNTHROID) 50 MCG tablet TAKE 1 TABLET BY MOUTH  DAILY EXCEPT 2 TABLETS ON  SUNDAYS AND WEDNESDAYS (  TOTAL 9 TABS PER WEEK)   melatonin 5 MG TABS Take 5 mg by mouth at bedtime as needed.   rivaroxaban (XARELTO) 2.5 MG TABS tablet Take 1 tablet (2.5 mg total) by mouth 2 (two) times daily.   rosuvastatin (CRESTOR) 20 MG tablet Take 1 tablet (20 mg total) by mouth daily.   tiZANidine (ZANAFLEX) 4 MG tablet Take 0.5-1 tablets (2-4 mg total) by mouth 2 (two) times daily  as needed for muscle spasms (sedation caution).   [DISCONTINUED] albuterol (PROAIR HFA) 108 (90 Base) MCG/ACT inhaler Inhale 2 puffs into the lungs every 6 (six) hours as needed for wheezing or shortness of breath.   [DISCONTINUED] metoprolol tartrate (LOPRESSOR) 100 MG tablet 1/2 tab ('50mg'$ ) daily if needed if SBP is above 120.   No facility-administered encounter medications on file as of 09/28/2021.    Allergies (verified) Aspirin, Codeine, Ibuprofen, Lipitor [atorvastatin], and Other   History: Past Medical History:  Diagnosis Date   Cataract    bilaterally corrected.   COPD (chronic obstructive pulmonary disease) ([place])     CVA (cerebral vascular accident) ([place])    Pt was unaware and does not know when it happened.  Was told after some testing was done.   Double vision    Dyspnea    Facial droop 10/2017   left   History of shingles 2012   HLD (hyperlipidemia)    HTN (hypertension)    Hypothyroidism    PVD (peripheral vascular disease) ([place])    Vision loss    temporary   Past Surgical History:  Procedure Laterality Date   ABDOMINAL AORTOGRAM N/A 04/30/2021   Procedure: ABDOMINAL AORTOGRAM;  Surgeon: [name], MD;  Location: [facility];  Service: Cardiovascular;  Laterality: N/A;   ABDOMINAL EXPLORATION SURGERY  1950s   "they thought I was pregnant; went in to explore"   APPENDECTOMY  childhood   BACK SURGERY     BREAST LUMPECTOMY Left 1960's   benign   carotid ultrasound  06/19/2007   0-39% stenosis   CATARACT EXTRACTION W/ INTRAOCULAR LENS IMPLANT Left 09/2016   CATARACT EXTRACTION W/ INTRAOCULAR LENS IMPLANT Right 10/2016   COLONOSCOPY WITH PROPOFOL N/A 04/08/2019   Procedure: COLONOSCOPY WITH PROPOFOL;  Surgeon: Milus Banister, MD;  Location: [facility];  Service: Endoscopy;  Laterality: N/A;   DILATION AND CURETTAGE OF UTERUS  1960's X 3   miscarriages   DOPPLER ECHOCARDIOGRAPHY  06/19/2007   Normal EF 55-70%   ENDARTERECTOMY FEMORAL Left 12/03/2020   Procedure: LEFT FEMORAL ENDARTERECTOMY AND PROFUNDAPLASTY;  Surgeon: [name], MD;  Location: [facility];  Service: Vascular;  Laterality: Left;   ILIAC ARTERY STENT Left 03/18/2016   common iliac stent (6 x 40), aortic stent (10 x 19)/notes 03/18/2016   INSERTION OF ILIAC STENT Left 12/03/2020   Procedure: AORTIC STENTING, INSERTION OF BILATERAL COMMON ILIAC STENTS, AND LEFT EXTERNAL ILIAC STENT;  Surgeon: [name], MD;  Location: [facility];  Service: Vascular;  Laterality: Left;   LAMINECTOMY AND MICRODISCECTOMY SPINE  1960's X 2   LOWER EXTREMITY ANGIOGRAM N/A 12/03/2020   Procedure: Andris Baumann;  Surgeon:  [name], MD;  Location: [facility];  Service: Vascular;  Laterality: N/A;   MOLE REMOVAL  07/10/2016   NSVD     x1   PATCH ANGIOPLASTY Left 12/03/2020   Procedure: PATCH ANGIOPLASTY OF LEFT COMMON FEMORAL ARTERY USING BOVINE PERICARDIUM PATCH;  Surgeon: [name], MD;  Location: [facility];  Service: Vascular;  Laterality: Left;   PERIPHERAL VASCULAR BALLOON ANGIOPLASTY Right 04/30/2021   Procedure: PERIPHERAL VASCULAR BALLOON ANGIOPLASTY;  Surgeon: [name], MD;  Location: [facility];  Service: Cardiovascular;  Laterality: Right;  rt common iliac   PERIPHERAL VASCULAR CATHETERIZATION N/A 03/18/2016   Procedure: Abdominal Aortogram w/Lower Extremity;  Surgeon: Elam Dutch, MD;  Location: [facility];  Service: Cardiovascular;  Laterality: N/A;   PERIPHERAL VASCULAR  CATHETERIZATION  03/18/2016   Procedure: Peripheral Vascular Intervention;  Surgeon: Elam Dutch, MD;  Location: [facility];  Service: Cardiovascular;;   PERIPHERAL VASCULAR INTERVENTION Left 04/30/2021   Procedure: PERIPHERAL VASCULAR INTERVENTION;  Surgeon: [name], MD;  Location: [facility];  Service: Cardiovascular;  Laterality: Left;  lt common iliac   POLYPECTOMY  04/08/2019   Procedure: POLYPECTOMY;  Surgeon: Milus Banister, MD;  [facility];  Service: Endoscopy;;   ULTRASOUND GUIDANCE FOR VASCULAR ACCESS Right 12/03/2020   Procedure: ULTRASOUND GUIDANCE FOR VASCULAR ACCESS, RIGHT FEMORAL ARTERY;  Surgeon: [name], MD;  [facility];  Service: Vascular;  Laterality: Right;   Family History  Problem Relation Age of Onset   Stroke Father 6   Cancer Brother    Colon cancer Neg Hx    Breast cancer Neg Hx    Colon polyps Neg Hx    Esophageal cancer Neg Hx    Rectal cancer Neg Hx    Stomach cancer Neg Hx    Social History   Socioeconomic History   Marital status: Widowed    Spouse name: Not on file   Number of children: 1   Years of  education: Not on file   Highest education level: Not on file  Occupational History   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    Comment: Glass blower/designer  Tobacco Use   Smoking status: Former    Packs/day: 0.25    Years: 69.00    Total pack years: 17.25    Types: Cigarettes    Quit date: 01/2021    Years since quitting: 0.7    Passive exposure: Current   Smokeless tobacco: Never  Vaping Use   Vaping Use: Never used  Substance and Sexual Activity   Alcohol use: No    Alcohol/week: 0.0 standard drinks of alcohol   Drug use: No   Sexual activity: Not on file  Other Topics Concern   Not on file  Social History Narrative   From [place]   Retired 2007   Widowed 2008 after 84 years   Enjoys yard work, but needs some help with yard work now.     Her daughter is helping at home some (some as of 2022).  Her daughter has vision loss and is widowed as of 2022 and moved in with patient.   Social Determinants of Health   Financial Resource Strain: Low Risk  (09/28/2021)   Overall Financial Resource Strain (CARDIA)    Difficulty of Paying Living Expenses: Not very hard  Food Insecurity: No Food Insecurity (09/28/2021)   Hunger Vital Sign    Worried About Running Out of Food in the Last Year: Never true    Ran Out of Food in the Last Year: Never true  Transportation Needs: Unknown (09/28/2021)   PRAPARE - Hydrologist (Medical): Not on file    Lack of Transportation (Non-Medical): No  Physical Activity: Insufficiently Active (09/28/2021)   Exercise Vital Sign    Days of Exercise per Week: 3 days    Minutes of Exercise per Session: 30 min  Stress: No Stress Concern Present (09/28/2021)   [place]    Feeling of Stress : Not at all  Social Connections: Socially Isolated (09/28/2021)   Social Connection and Isolation Panel [NHANES]    Frequency of Communication with Friends and Family: More than three times  a week    Frequency of  Social Gatherings with Friends and Family: Never    Attends Religious Services: Never    Marine scientist or Organizations: No    Attends Archivist Meetings: Never    Marital Status: Widowed    Tobacco Counseling Counseling given: Not Answered   Clinical Intake:  Pre-visit preparation completed: Yes  Pain : No/denies pain     Nutritional Risks: None Diabetes: No  How often do you need to have someone help you when you read instructions, pamphlets, or other written materials from your doctor or pharmacy?: 1 - Never  Diabetic?no  Interpreter Needed?: No  Information entered by :: Kirke Shaggy, LPN   Activities of Daily Living    09/28/2021    9:38 AM 12/03/2020    6:20 PM  In your present state of health, do you have any difficulty performing the following activities:  Hearing? 0   Vision? 0   Difficulty concentrating or making decisions? 0   Walking or climbing stairs? 0   Dressing or bathing? 0   Doing errands, shopping? 0 1  Comment  since legs started hurting  Preparing Food and eating ? N   Using the Toilet? N   In the past six months, have you accidently leaked urine? N   Do you have problems with loss of bowel control? N   Managing your Medications? N   Managing your Finances? N   Housekeeping or managing your Housekeeping? N     Patient Care Team: Tonia Ghent, MD as PCP - General (Family Medicine) Marica Otter, [place] as Referring Physician (Optometry) Charlton Haws, [facility] as Pharmacist (Pharmacist)  Indicate any recent Medical Services you may have received from other than [facility] providers in the past year (date may be approximate).     Assessment:   This is a routine wellness examination for Jill Franco.  Hearing/Vision screen Hearing Screening - Comments:: No aids Vision Screening - Comments:: Wears glasses- Dr.Miller  Dietary issues and exercise activities discussed: Current Exercise Habits: Home  exercise routine, Type of exercise: walking, Time (Minutes): 30, Frequency (Times/Week): 3, Weekly Exercise (Minutes/Week): 90, Intensity: Mild   Goals Addressed             This Visit's Progress    DIET - EAT MORE FRUITS AND VEGETABLES        Depression Screen    09/28/2021    9:35 AM 09/18/2020    9:11 AM 09/12/2019   10:09 AM 08/17/2017    8:54 AM 08/04/2016    1:40 PM 07/27/2015    1:07 PM 08/04/2014    9:55 AM  PHQ 2/9 Scores  PHQ - 2 Score 0 0 0 0 0 0 0  PHQ- 9 Score 0 0  0       Fall Risk    09/28/2021    9:38 AM 09/18/2020    8:42 AM 09/12/2019   10:09 AM 08/17/2017    8:54 AM 08/04/2016    1:40 PM  Fall Risk   Falls in the past year? 0 0 0 No No  Number falls in past yr: 0 0     Injury with Fall? 0 0     Risk for fall due to : No Fall Risks No Fall Risks     Follow up Falls evaluation completed Falls evaluation completed       FALL RISK PREVENTION PERTAINING TO THE HOME:  Any stairs in or around the home? No  If so, are there any without  handrails? No  Home free of loose throw rugs in walkways, pet beds, electrical cords, etc? Yes  Adequate lighting in your home to reduce risk of falls? Yes   ASSISTIVE DEVICES UTILIZED TO PREVENT FALLS:  Life alert? No  Use of a cane, walker or w/c? No  Grab bars in the bathroom? Yes  Shower chair or bench in shower? Yes  Elevated toilet seat or a handicapped toilet? Yes   Cognitive Function:declined PT A & O       08/17/2017    8:55 AM 08/04/2016    1:40 PM 07/27/2015    1:25 PM  MMSE - Mini Mental State Exam  Orientation to time '5 5 5  '$ Orientation to Place '5 5 5  '$ Registration '3 3 3  '$ Attention/ Calculation 0 0 0  Recall '3 3 3  '$ Language- name 2 objects 0 0 0  Language- repeat '1 1 1  '$ Language- follow 3 step command '3 3 3  '$ Language- read & follow direction 0 0 0  Write a sentence 0 0 0  Copy design 0 0 0  Total score '20 20 20        '$ Immunizations Immunization History  Administered Date(s) Administered   Fluad  Quad(high Dose 65+) 11/23/2018, 11/02/2019, 10/22/2020   Influenza Split 12/06/2010   Influenza Whole 11/21/2004, 11/30/2006, 12/02/2008, 11/27/2009   Influenza,inj,Quad PF,6+ Mos 12/11/2012, 11/12/2013, 01/29/2015, 11/27/2015, 12/22/2016, 11/21/2017   PFIZER(Purple Top)SARS-COV-2 Vaccination 04/21/2019, 05/21/2019, 02/07/2020   Pneumococcal Conjugate-13 08/04/2014   Pneumococcal Polysaccharide-23 05/23/2006   Td 05/22/2004   Tdap 05/31/2010   Zoster Recombinat (Shingrix) 02/22/2019, 04/22/2019   Zoster, Live 06/11/2010    TDAP status: Due, Education has been provided regarding the importance of this vaccine. Advised may receive this vaccine at local pharmacy or Health Dept. Aware to provide a copy of the vaccination record if obtained from local pharmacy or Health Dept. Verbalized acceptance and understanding.  Flu Vaccine status: Up to date  Pneumococcal vaccine status: Up to date  Covid-19 vaccine status: Completed vaccines  Qualifies for Shingles Vaccine? Yes   Zostavax completed Yes   Shingrix Completed?: Yes  Screening Tests Health Maintenance  Topic Date Due   COVID-19 Vaccine (4 - Pfizer series) 04/03/2020   TETANUS/TDAP  05/30/2020   INFLUENZA VACCINE  09/21/2021   DEXA SCAN  07/27/2023 (Originally 05/21/2001)   Pneumonia Vaccine 28+ Years old  Completed   Zoster Vaccines- Shingrix  Completed   HPV VACCINES  Aged Out    Health Maintenance  Health Maintenance Due  Topic Date Due   COVID-19 Vaccine (4 - Pfizer series) 04/03/2020   TETANUS/TDAP  05/30/2020   INFLUENZA VACCINE  09/21/2021    Colorectal cancer screening: No longer required.   Mammogram status: No longer required due to aged.    Lung Cancer Screening: (Low Dose CT Chest recommended if Age 14-80 years, 30 pack-year currently smoking OR have quit w/in 15years.) does not qualify.   Lung Cancer Screening Referral: declined  Additional Screening:  Hepatitis C Screening: does not qualify;  Completed no  Vision Screening: Recommended annual ophthalmology exams for early detection of glaucoma and other disorders of the eye. Is the patient up to date with their annual eye exam?  Yes  Who is the provider or what is the name of the office in which the patient attends annual eye exams? Dr.Miller If pt is not established with a provider, would they like to be referred to a provider to establish care? No .  Dental Screening: Recommended annual dental exams for proper oral hygiene  Community Resource Referral / Chronic Care Management: CRR required this visit?  No   CCM required this visit?  No      Plan:     I have personally reviewed and noted the following in the patient's chart:   Medical and social history Use of alcohol, tobacco or illicit drugs  Current medications and supplements including opioid prescriptions.  Functional ability and status Nutritional status Physical activity Advanced directives List of other physicians Hospitalizations, surgeries, and ER visits in previous 12 months Vitals Screenings to include cognitive, depression, and falls Referrals and appointments  In addition, I have reviewed and discussed with patient certain preventive protocols, quality metrics, and best practice recommendations. A written personalized care plan for preventive services as well as general preventive health recommendations were provided to patient.     Dionisio David, LPN   7/74/1287   Nurse Notes: none

## 2021-10-06 NOTE — Addendum Note (Signed)
Addended by: Dionisio David on: 10/06/2021 10:33 AM   Modules accepted: Orders

## 2021-10-21 ENCOUNTER — Ambulatory Visit: Payer: Medicare Other | Admitting: Cardiology

## 2021-10-21 ENCOUNTER — Ambulatory Visit: Payer: Medicare Other | Admitting: Student

## 2021-10-25 ENCOUNTER — Other Ambulatory Visit: Payer: Self-pay | Admitting: Family Medicine

## 2021-10-25 DIAGNOSIS — R0600 Dyspnea, unspecified: Secondary | ICD-10-CM

## 2021-10-28 ENCOUNTER — Ambulatory Visit (INDEPENDENT_AMBULATORY_CARE_PROVIDER_SITE_OTHER): Payer: Medicare Other | Admitting: Family Medicine

## 2021-10-28 ENCOUNTER — Telehealth: Payer: Self-pay | Admitting: Radiology

## 2021-10-28 ENCOUNTER — Encounter: Payer: Self-pay | Admitting: Family Medicine

## 2021-10-28 ENCOUNTER — Ambulatory Visit (INDEPENDENT_AMBULATORY_CARE_PROVIDER_SITE_OTHER)
Admission: RE | Admit: 2021-10-28 | Discharge: 2021-10-28 | Disposition: A | Discharge status: Home / Self Care | Payer: Medicare Other | Source: Ambulatory Visit | Attending: Family Medicine | Admitting: Family Medicine

## 2021-10-28 VITALS — BP 154/76 | HR 69 | Temp 97.9°F | Ht 64.0 in | Wt 107.4 lb

## 2021-10-28 DIAGNOSIS — R197 Diarrhea, unspecified: Secondary | ICD-10-CM | POA: Diagnosis not present

## 2021-10-28 DIAGNOSIS — E876 Hypokalemia: Secondary | ICD-10-CM

## 2021-10-28 DIAGNOSIS — M160 Bilateral primary osteoarthritis of hip: Secondary | ICD-10-CM | POA: Diagnosis not present

## 2021-10-28 DIAGNOSIS — K6389 Other specified diseases of intestine: Secondary | ICD-10-CM | POA: Diagnosis not present

## 2021-10-28 DIAGNOSIS — I700 Atherosclerosis of aorta: Secondary | ICD-10-CM | POA: Diagnosis not present

## 2021-10-28 LAB — CBC WITH DIFFERENTIAL/PLATELET
Basophils Absolute: 0 10*3/uL (ref 0.0–0.1)
Basophils Relative: 0.6 % (ref 0.0–3.0)
Eosinophils Absolute: 0.1 10*3/uL (ref 0.0–0.7)
Eosinophils Relative: 1.1 % (ref 0.0–5.0)
HCT: 39.3 % (ref 36.0–46.0)
Hemoglobin: 12.3 g/dL (ref 12.0–15.0)
Lymphocytes Relative: 11.1 % — ABNORMAL LOW (ref 12.0–46.0)
Lymphs Abs: 0.9 10*3/uL (ref 0.7–4.0)
MCHC: 31.4 g/dL (ref 30.0–36.0)
MCV: 81.2 fl (ref 78.0–100.0)
Monocytes Absolute: 0.8 10*3/uL (ref 0.1–1.0)
Monocytes Relative: 10.3 % (ref 3.0–12.0)
Neutro Abs: 6.1 10*3/uL (ref 1.4–7.7)
Neutrophils Relative %: 76.9 % (ref 43.0–77.0)
Platelets: 307 10*3/uL (ref 150.0–400.0)
RBC: 4.84 Mil/uL (ref 3.87–5.11)
RDW: 18.4 % — ABNORMAL HIGH (ref 11.5–15.5)
WBC: 7.9 10*3/uL (ref 4.0–10.5)

## 2021-10-28 LAB — COMPREHENSIVE METABOLIC PANEL
ALT: 13 U/L (ref 0–35)
AST: 22 U/L (ref 0–37)
Albumin: 3.8 g/dL (ref 3.5–5.2)
Alkaline Phosphatase: 68 U/L (ref 39–117)
BUN: 15 mg/dL (ref 6–23)
CO2: 30 mEq/L (ref 19–32)
Calcium: 9.3 mg/dL (ref 8.4–10.5)
Chloride: 94 mEq/L — ABNORMAL LOW (ref 96–112)
Creatinine, Ser: 0.79 mg/dL (ref 0.40–1.20)
GFR: 68.2 mL/min (ref >60.00)
Glucose, Bld: 96 mg/dL (ref 70–99)
Potassium: 2.5 mEq/L — CL (ref 3.5–5.1)
Sodium: 136 mEq/L (ref 135–145)
Total Bilirubin: 0.5 mg/dL (ref 0.2–1.2)
Total Protein: 7.2 g/dL (ref 6.0–8.3)

## 2021-10-28 MED ORDER — METOPROLOL TARTRATE 100 MG PO TABS: ORAL_TABLET | ORAL | Status: DC

## 2021-10-28 MED ORDER — POTASSIUM CHLORIDE CRYS ER 20 MEQ PO TBCR: 20.0000 meq | EXTENDED_RELEASE_TABLET | Freq: Two times a day (BID) | ORAL | 0 refills | Status: DC

## 2021-10-28 NOTE — Progress Notes (Unsigned)
She skipped lasix this AM.  She had been taking it daily prior.    Had been seen for wrist pain and diclofenac gel helped.    Prev KUB d/w pt.    See for constipation but then had diarrhea ever since.  Lightheaded in the meantime.   C/o passing out on 10/22/21 after being dizzy. Bruise is resolving per patient report.  Tried OTC electrolyte solution yesterday, felt better and no diarrhea until this AM.  No blood in stool.  Cramping with BMs.  No sick contacts.  Well water but no one else is sick.  No fevers.  Took imodium the day prior to yesterday.    Meds, vitals, and allergies reviewed.   ROS: Per HPI unless specifically indicated in ROS section   R facial bruising.    See if she will be able to get symbicort in 2024.  May need PA.  She improved with symbicort and shouldn't change.

## 2021-10-28 NOTE — Patient Instructions (Addendum)
Go to the lab on the way out.   If you have mychart we'll likely use that to update you.    If lightheaded the skip lasix that day.  Take care.  Glad to see you. Try imodium '2mg'$  daily if needed.   Drink enough water to keep your urine clear or light colored.

## 2021-10-28 NOTE — Telephone Encounter (Signed)
Please call pt.  Low potassium.  Would recheck in about 5-6 days.  I put in the order.  Needs a lab visit.  Would take 49mq BID for 5 days.  Rx sent.

## 2021-10-28 NOTE — Telephone Encounter (Signed)
[facility] lab called a critical result, potassium 2.5. Results given to Dr Damita Dunnings

## 2021-10-28 NOTE — Telephone Encounter (Signed)
Spoke with pt/pt's daughter, Thayer Headings (on dpr), relaying Dr. Josefine Class msg.  Verbalizes understanding and scheduled lab visit on 11/03/21 at 10:00.

## 2021-10-29 ENCOUNTER — Other Ambulatory Visit (INDEPENDENT_AMBULATORY_CARE_PROVIDER_SITE_OTHER): Payer: Medicare Other

## 2021-10-29 DIAGNOSIS — R197 Diarrhea, unspecified: Secondary | ICD-10-CM

## 2021-10-30 LAB — GASTROINTESTINAL PATHOGEN PNL
CampyloBacter Group: NOT DETECTED
Norovirus GI/GII: NOT DETECTED
Rotavirus A: NOT DETECTED
Salmonella species: NOT DETECTED
Shiga Toxin 1: NOT DETECTED
Shiga Toxin 2: NOT DETECTED
Shigella Species: NOT DETECTED
Vibrio Group: NOT DETECTED
Yersinia enterocolitica: NOT DETECTED

## 2021-10-31 DIAGNOSIS — R197 Diarrhea, unspecified: Secondary | ICD-10-CM | POA: Insufficient documentation

## 2021-10-31 NOTE — Assessment & Plan Note (Signed)
I suspect the diarrhea caused her to be lightheaded/orthostatic and that led to the syncope.  At this point not orthostatic upon standing.  Routine cautions given to patient. If lightheaded the skip lasix that day.  Try imodium '2mg'$  daily if needed.   Drink enough water to keep your urine clear or light colored.   See notes on labs.  At this point still okay for outpatient follow-up.

## 2021-11-01 ENCOUNTER — Inpatient Hospital Stay (HOSPITAL_BASED_OUTPATIENT_CLINIC_OR_DEPARTMENT_OTHER)
Admission: EM | Admit: 2021-11-01 | Discharge: 2021-11-03 | DRG: 191 | Disposition: A | Discharge status: Home / Self Care | Payer: Medicare Other | Attending: Family Medicine | Admitting: Family Medicine

## 2021-11-01 ENCOUNTER — Other Ambulatory Visit: Payer: Self-pay

## 2021-11-01 ENCOUNTER — Emergency Department (HOSPITAL_BASED_OUTPATIENT_CLINIC_OR_DEPARTMENT_OTHER): Payer: Medicare Other | Admitting: Radiology

## 2021-11-01 DIAGNOSIS — K219 Gastro-esophageal reflux disease without esophagitis: Secondary | ICD-10-CM | POA: Diagnosis not present

## 2021-11-01 DIAGNOSIS — J439 Emphysema, unspecified: Secondary | ICD-10-CM | POA: Diagnosis not present

## 2021-11-01 DIAGNOSIS — Z7901 Long term (current) use of anticoagulants: Secondary | ICD-10-CM | POA: Diagnosis not present

## 2021-11-01 DIAGNOSIS — I739 Peripheral vascular disease, unspecified: Secondary | ICD-10-CM | POA: Diagnosis not present

## 2021-11-01 DIAGNOSIS — E872 Acidosis, unspecified: Secondary | ICD-10-CM | POA: Diagnosis not present

## 2021-11-01 DIAGNOSIS — Z8619 Personal history of other infectious and parasitic diseases: Secondary | ICD-10-CM | POA: Diagnosis not present

## 2021-11-01 DIAGNOSIS — Z886 Allergy status to analgesic agent status: Secondary | ICD-10-CM | POA: Diagnosis not present

## 2021-11-01 DIAGNOSIS — K5900 Constipation, unspecified: Secondary | ICD-10-CM | POA: Diagnosis present

## 2021-11-01 DIAGNOSIS — E038 Other specified hypothyroidism: Secondary | ICD-10-CM | POA: Diagnosis not present

## 2021-11-01 DIAGNOSIS — E039 Hypothyroidism, unspecified: Secondary | ICD-10-CM | POA: Diagnosis not present

## 2021-11-01 DIAGNOSIS — J440 Chronic obstructive pulmonary disease with acute lower respiratory infection: Secondary | ICD-10-CM | POA: Diagnosis not present

## 2021-11-01 DIAGNOSIS — E782 Mixed hyperlipidemia: Secondary | ICD-10-CM | POA: Diagnosis present

## 2021-11-01 DIAGNOSIS — D649 Anemia, unspecified: Secondary | ICD-10-CM | POA: Diagnosis present

## 2021-11-01 DIAGNOSIS — R197 Diarrhea, unspecified: Secondary | ICD-10-CM | POA: Diagnosis not present

## 2021-11-01 DIAGNOSIS — J209 Acute bronchitis, unspecified: Secondary | ICD-10-CM | POA: Diagnosis present

## 2021-11-01 DIAGNOSIS — Z20822 Contact with and (suspected) exposure to covid-19: Secondary | ICD-10-CM | POA: Diagnosis present

## 2021-11-01 DIAGNOSIS — I10 Essential (primary) hypertension: Secondary | ICD-10-CM | POA: Diagnosis not present

## 2021-11-01 DIAGNOSIS — Z79890 Hormone replacement therapy (postmenopausal): Secondary | ICD-10-CM

## 2021-11-01 DIAGNOSIS — Z8673 Personal history of transient ischemic attack (TIA), and cerebral infarction without residual deficits: Secondary | ICD-10-CM

## 2021-11-01 DIAGNOSIS — Z79899 Other long term (current) drug therapy: Secondary | ICD-10-CM

## 2021-11-01 DIAGNOSIS — N12 Tubulo-interstitial nephritis, not specified as acute or chronic: Secondary | ICD-10-CM | POA: Diagnosis not present

## 2021-11-01 DIAGNOSIS — Z823 Family history of stroke: Secondary | ICD-10-CM | POA: Diagnosis not present

## 2021-11-01 DIAGNOSIS — N3000 Acute cystitis without hematuria: Secondary | ICD-10-CM | POA: Diagnosis present

## 2021-11-01 DIAGNOSIS — J449 Chronic obstructive pulmonary disease, unspecified: Secondary | ICD-10-CM | POA: Diagnosis present

## 2021-11-01 DIAGNOSIS — Z7951 Long term (current) use of inhaled steroids: Secondary | ICD-10-CM

## 2021-11-01 DIAGNOSIS — H547 Unspecified visual loss: Secondary | ICD-10-CM | POA: Diagnosis present

## 2021-11-01 DIAGNOSIS — F17210 Nicotine dependence, cigarettes, uncomplicated: Secondary | ICD-10-CM | POA: Diagnosis not present

## 2021-11-01 DIAGNOSIS — B9689 Other specified bacterial agents as the cause of diseases classified elsewhere: Secondary | ICD-10-CM | POA: Diagnosis not present

## 2021-11-01 DIAGNOSIS — N390 Urinary tract infection, site not specified: Secondary | ICD-10-CM | POA: Diagnosis present

## 2021-11-01 DIAGNOSIS — J90 Pleural effusion, not elsewhere classified: Secondary | ICD-10-CM | POA: Diagnosis not present

## 2021-11-01 DIAGNOSIS — Z885 Allergy status to narcotic agent status: Secondary | ICD-10-CM

## 2021-11-01 DIAGNOSIS — J441 Chronic obstructive pulmonary disease with (acute) exacerbation: Principal | ICD-10-CM | POA: Diagnosis present

## 2021-11-01 DIAGNOSIS — Z888 Allergy status to other drugs, medicaments and biological substances status: Secondary | ICD-10-CM

## 2021-11-01 DIAGNOSIS — J208 Acute bronchitis due to other specified organisms: Secondary | ICD-10-CM | POA: Diagnosis not present

## 2021-11-01 LAB — URINALYSIS, ROUTINE W REFLEX MICROSCOPIC
Bilirubin Urine: NEGATIVE
Glucose, UA: NEGATIVE mg/dL
Ketones, ur: NEGATIVE mg/dL
Nitrite: POSITIVE — AB
Protein, ur: 30 mg/dL — AB
Specific Gravity, Urine: 1.016 (ref 1.005–1.030)
WBC, UA: >50 WBC/hpf — ABNORMAL HIGH (ref 0–5)
pH: 7 (ref 5.0–8.0)

## 2021-11-01 LAB — CBC WITH DIFFERENTIAL/PLATELET
Abs Immature Granulocytes: 0.06 10*3/uL (ref 0.00–0.07)
Basophils Absolute: 0 10*3/uL (ref 0.0–0.1)
Basophils Relative: 0 %
Eosinophils Absolute: 0 10*3/uL (ref 0.0–0.5)
Eosinophils Relative: 0 %
HCT: 37.7 % (ref 36.0–46.0)
Hemoglobin: 11.7 g/dL — ABNORMAL LOW (ref 12.0–15.0)
Immature Granulocytes: 1 %
Lymphocytes Relative: 4 %
Lymphs Abs: 0.5 10*3/uL — ABNORMAL LOW (ref 0.7–4.0)
MCH: 25.7 pg — ABNORMAL LOW (ref 26.0–34.0)
MCHC: 31 g/dL (ref 30.0–36.0)
MCV: 82.7 fL (ref 80.0–100.0)
Monocytes Absolute: 1.2 10*3/uL — ABNORMAL HIGH (ref 0.1–1.0)
Monocytes Relative: 10 %
Neutro Abs: 10 10*3/uL — ABNORMAL HIGH (ref 1.7–7.7)
Neutrophils Relative %: 85 %
Platelets: 310 10*3/uL (ref 150–400)
RBC: 4.56 MIL/uL (ref 3.87–5.11)
RDW: 17.5 % — ABNORMAL HIGH (ref 11.5–15.5)
WBC: 11.8 10*3/uL — ABNORMAL HIGH (ref 4.0–10.5)
nRBC: 0 % (ref 0.0–0.2)

## 2021-11-01 LAB — COMPREHENSIVE METABOLIC PANEL
ALT: 20 U/L (ref 0–44)
AST: 42 U/L — ABNORMAL HIGH (ref 15–41)
Albumin: 3.7 g/dL (ref 3.5–5.0)
Alkaline Phosphatase: 101 U/L (ref 38–126)
Anion gap: 11 (ref 5–15)
BUN: 12 mg/dL (ref 8–23)
CO2: 21 mmol/L — ABNORMAL LOW (ref 22–32)
Calcium: 8.9 mg/dL (ref 8.9–10.3)
Chloride: 101 mmol/L (ref 98–111)
Creatinine, Ser: 0.8 mg/dL (ref 0.44–1.00)
GFR, Estimated: >60 mL/min (ref >60)
Glucose, Bld: 128 mg/dL — ABNORMAL HIGH (ref 70–99)
Potassium: 4.4 mmol/L (ref 3.5–5.1)
Sodium: 133 mmol/L — ABNORMAL LOW (ref 135–145)
Total Bilirubin: 0.5 mg/dL (ref 0.3–1.2)
Total Protein: 6.8 g/dL (ref 6.5–8.1)

## 2021-11-01 LAB — C. DIFFICILE GDH AND TOXIN A/B
GDH ANTIGEN: NOT DETECTED
MICRO NUMBER:: 13891750
SPECIMEN QUALITY:: ADEQUATE
TOXIN A AND B: NOT DETECTED

## 2021-11-01 LAB — RESP PANEL BY RT-PCR (FLU A&B, COVID) ARPGX2
Influenza A by PCR: NEGATIVE
Influenza B by PCR: NEGATIVE
SARS Coronavirus 2 by RT PCR: NEGATIVE

## 2021-11-01 LAB — PROTIME-INR
INR: 1.5 — ABNORMAL HIGH (ref 0.8–1.2)
Prothrombin Time: 17.5 seconds — ABNORMAL HIGH (ref 11.4–15.2)

## 2021-11-01 LAB — LACTIC ACID, PLASMA: Lactic Acid, Venous: 1.3 mmol/L (ref 0.5–1.9)

## 2021-11-01 LAB — FECAL OCCULT BLOOD, IMMUNOCHEMICAL: Fecal Occult Bld: NEGATIVE

## 2021-11-01 MED ORDER — SODIUM CHLORIDE 0.9 % IV SOLN
1.0000 g | Freq: Once | INTRAVENOUS | Status: AC
  Administered 2021-11-01: 1 g via INTRAVENOUS
  Filled 2021-11-01: qty 10

## 2021-11-01 MED ORDER — LACTATED RINGERS IV BOLUS
500.0000 mL | Freq: Once | INTRAVENOUS | Status: AC
  Administered 2021-11-01: 500 mL via INTRAVENOUS

## 2021-11-01 MED ORDER — PREDNISONE 50 MG PO TABS
60.0000 mg | ORAL_TABLET | Freq: Once | ORAL | Status: AC
  Administered 2021-11-01: 60 mg via ORAL
  Filled 2021-11-01: qty 1

## 2021-11-01 MED ORDER — LACTATED RINGERS IV SOLN: INTRAVENOUS | Status: DC

## 2021-11-01 MED ORDER — ONDANSETRON HCL 4 MG/2ML IJ SOLN
4.0000 mg | Freq: Once | INTRAMUSCULAR | Status: AC
  Administered 2021-11-01: 4 mg via INTRAVENOUS
  Filled 2021-11-01: qty 2

## 2021-11-01 MED ORDER — IPRATROPIUM-ALBUTEROL 0.5-2.5 (3) MG/3ML IN SOLN
3.0000 mL | Freq: Once | RESPIRATORY_TRACT | Status: AC
  Administered 2021-11-01: 3 mL via RESPIRATORY_TRACT
  Filled 2021-11-01: qty 3

## 2021-11-01 MED ORDER — SODIUM CHLORIDE 0.9 % IV SOLN
500.0000 mg | Freq: Once | INTRAVENOUS | Status: AC
  Administered 2021-11-01: 500 mg via INTRAVENOUS
  Filled 2021-11-01: qty 5

## 2021-11-01 MED ORDER — LACTATED RINGERS IV BOLUS
1000.0000 mL | Freq: Once | INTRAVENOUS | Status: AC
  Administered 2021-11-01: 1000 mL via INTRAVENOUS

## 2021-11-01 NOTE — ED Provider Notes (Signed)
[facility] EMERGENCY DEPT Provider Note   CSN: [id number] Arrival date & time: 11/01/21  1828     History  Chief Complaint  Patient presents with   Cough    Jill Franco is a 87 y.o. female.   Cough Associated symptoms: shortness of breath   Patient presents for persistent diarrhea, generalized weakness, and worsened cough.  Medical history includes hypothyroidism, HLD, HTN, PAD, CVA, COPD, anemia.  She states that she has had ongoing diarrhea for the past 2 weeks.  She typically has 5-6 episodes per day.  She was seen by PCP 4 days ago.  At that time, she endorsed a recent syncopal episode.  She was advised to stay hydrated and take Imodium as needed.  Today she reports continued diarrhea, a worsened cough that is now productive of yellow sputum, generalized weakness, fatigue.  She has a history of COPD and utilizes inhalers only at home.    Home Medications Prior to Admission medications   Medication Sig Start Date End Date Taking? Authorizing Provider  albuterol (VENTOLIN HFA) 108 (90 Base) MCG/ACT inhaler USE 2 INHALATIONS BY MOUTH  INTO THE LUNGS EVERY 6  HOURS AS NEEDED FOR  WHEEZING OR SHORTNESS OF  BREATH 09/28/21   Tonia Ghent, MD  alendronate (FOSAMAX) 70 MG tablet Take 1 tablet (70 mg total) by mouth every 7 (seven) days. Take with a full glass of water on an empty stomach. 09/24/21   Tonia Ghent, MD  aspirin EC 81 MG tablet Take 1 tablet (81 mg total) by mouth daily. Swallow whole. 03/29/21   Cantwell, Celeste C, PA-C  budesonide-formoterol (SYMBICORT) 160-4.5 MCG/ACT inhaler Inhale 2 puffs into the lungs in the morning and at bedtime. 05/11/21   Tonia Ghent, MD  Cholecalciferol (VITAMIN D3) 25 MCG (1000 UT) CAPS Take 1 capsule (1,000 Units total) by mouth daily. 10/25/20   Tonia Ghent, MD  diclofenac Sodium (VOLTAREN) 1 % GEL Apply 2 g topically 4 (four) times daily. 09/22/21   Michela Pitcher, NP  furosemide (LASIX) 20 MG tablet TAKE 2 TABLETS  BY MOUTH IN THE  MORNING 10/26/21   Tonia Ghent, MD  levothyroxine (SYNTHROID) 50 MCG tablet TAKE 1 TABLET BY MOUTH  DAILY EXCEPT 2 TABLETS ON  SUNDAYS AND WEDNESDAYS (  TOTAL 9 TABS PER WEEK) 07/12/21   Tonia Ghent, MD  melatonin 5 MG TABS Take 5 mg by mouth at bedtime as needed.    [provider]  metoprolol tartrate (LOPRESSOR) 100 MG tablet TAKE ONE-HALF TABLET BY  MOUTH IN THE MORNING AND 1  TABLET BY MOUTH IN THE  EVENING IF BP IS ABOVE 140/90. 10/28/21   Tonia Ghent, MD  potassium chloride SA (KLOR-CON M) 20 MEQ tablet Take 1 tablet (20 mEq total) by mouth 2 (two) times daily. 10/28/21   Tonia Ghent, MD  rivaroxaban (XARELTO) 2.5 MG TABS tablet Take 1 tablet (2.5 mg total) by mouth 2 (two) times daily. 08/18/21   Tonia Ghent, MD  rosuvastatin (CRESTOR) 20 MG tablet Take 1 tablet (20 mg total) by mouth daily. 09/24/21 09/19/22  Tonia Ghent, MD  tiZANidine (ZANAFLEX) 4 MG tablet Take 0.5-1 tablets (2-4 mg total) by mouth 2 (two) times daily as needed for muscle spasms (sedation caution). 06/30/21   Tonia Ghent, MD      Allergies    Aspirin, Codeine, Ibuprofen, Lipitor [atorvastatin], and Other    Review of Systems   Review of  Systems  Constitutional:  Positive for activity change and fatigue.  Respiratory:  Positive for cough and shortness of breath.   Gastrointestinal:  Positive for diarrhea.  Genitourinary:  Positive for frequency.  Neurological:  Positive for weakness (Generalized).  All other systems reviewed and are negative.   Physical Exam Updated Vital Signs BP (!) 161/73   Pulse 92   Temp (!) 100.8 F (38.2 C) (Rectal)   Resp (!) 33   Ht '5\' 4"'$  (1.626 m)   Wt 48.5 kg   SpO2 95%   BMI 18.37 kg/m  Physical Exam Vitals and nursing note reviewed.  Constitutional:      General: She is not in acute distress.    Appearance: She is well-developed. She is ill-appearing. She is not toxic-appearing or diaphoretic.  HENT:     Head:  Normocephalic and atraumatic.     Right Ear: External ear normal.     Left Ear: External ear normal.     Nose: Nose normal.     Mouth/Throat:     Mouth: Mucous membranes are moist.     Pharynx: Oropharynx is clear.  Eyes:     Extraocular Movements: Extraocular movements intact.     Conjunctiva/sclera: Conjunctivae normal.  Cardiovascular:     Rate and Rhythm: Normal rate and regular rhythm.     Heart sounds: No murmur heard. Pulmonary:     Effort: Pulmonary effort is normal. Tachypnea present. No respiratory distress.     Breath sounds: Wheezing present. No rhonchi or rales.  Abdominal:     General: There is no distension.     Palpations: Abdomen is soft.     Tenderness: There is no abdominal tenderness. There is no right CVA tenderness or left CVA tenderness.  Musculoskeletal:        General: No swelling. Normal range of motion.     Cervical back: Normal range of motion and neck supple.     Right lower leg: No edema.     Left lower leg: No edema.  Skin:    General: Skin is warm and dry.     Coloration: Skin is pale. Skin is not jaundiced.  Neurological:     General: No focal deficit present.     Mental Status: She is alert and oriented to person, place, and time.     Cranial Nerves: No cranial nerve deficit.     Sensory: No sensory deficit.     Motor: No weakness.     Coordination: Coordination normal.  Psychiatric:        Mood and Affect: Mood normal.        Behavior: Behavior normal.        Thought Content: Thought content normal.        Judgment: Judgment normal.     ED Results / Procedures / Treatments   Labs (all labs ordered are listed, but only abnormal results are displayed) Labs Reviewed  COMPREHENSIVE METABOLIC PANEL - Abnormal; Notable for the following components:      Result Value   Sodium 133 (*)    CO2 21 (*)    Glucose, Bld 128 (*)    AST 42 (*)    All other components within normal limits  CBC WITH DIFFERENTIAL/PLATELET - Abnormal; Notable for  the following components:   WBC 11.8 (*)    Hemoglobin 11.7 (*)    MCH 25.7 (*)    RDW 17.5 (*)    Neutro Abs 10.0 (*)    Lymphs Abs 0.5 (*)  Monocytes Absolute 1.2 (*)    All other components within normal limits  PROTIME-INR - Abnormal; Notable for the following components:   Prothrombin Time 17.5 (*)    INR 1.5 (*)    All other components within normal limits  URINALYSIS, ROUTINE W REFLEX MICROSCOPIC - Abnormal; Notable for the following components:   APPearance HAZY (*)    Hgb urine dipstick LARGE (*)    Protein, ur 30 (*)    Nitrite POSITIVE (*)    Leukocytes,Ua LARGE (*)    WBC, UA >50 (*)    Bacteria, UA MANY (*)    All other components within normal limits  RESP PANEL BY RT-PCR (FLU A&B, COVID) ARPGX2  CULTURE, BLOOD (ROUTINE X 2)  CULTURE, BLOOD (ROUTINE X 2)  URINE CULTURE  LACTIC ACID, PLASMA    EKG None  Radiology DG Chest 2 View  Result Date: 11/01/2021 CLINICAL DATA:  Suspected sepsis. EXAM: CHEST - 2 VIEW COMPARISON:  Chest x-ray 06/09/2021 FINDINGS: There are trace bilateral pleural effusions. The lungs are hyperinflated, unchanged. There is no focal lung infiltrate. There is scarring in the right lung apex similar to prior study. Cardiomediastinal silhouette is within normal limits. No acute fractures. IMPRESSION: 1. Trace bilateral pleural effusions. 2. Emphysema. Electronically Signed   By: Ronney Asters M.D.   On: 11/01/2021 19:55    Procedures Procedures    Medications Ordered in ED Medications  azithromycin (ZITHROMAX) 500 mg in sodium chloride 0.9 % 250 mL IVPB (500 mg Intravenous New Bag/Given 11/01/21 2229)  ondansetron (ZOFRAN) injection 4 mg (has no administration in time range)  lactated ringers bolus 500 mL (has no administration in time range)  ipratropium-albuterol (DUONEB) 0.5-2.5 (3) MG/3ML nebulizer solution 3 mL (3 mLs Nebulization Given 11/01/21 2144)  predniSONE (DELTASONE) tablet 60 mg (60 mg Oral Given 11/01/21 2140)  cefTRIAXone  (ROCEPHIN) 1 g in sodium chloride 0.9 % 100 mL IVPB (0 g Intravenous Stopped 11/01/21 2218)  lactated ringers bolus 1,000 mL (1,000 mLs Intravenous New Bag/Given 11/01/21 2229)    ED Course/ Medical Decision Making/ A&P                           Medical Decision Making Amount and/or Complexity of Data Reviewed Labs: ordered. Radiology: ordered.  Risk Prescription drug management.   This patient presents to the ED for concern of fatigue, generalized weakness, persistent diarrhea, and worsened cough, this involves an extensive number of treatment options, and is a complaint that carries with it a high risk of complications and morbidity.  The differential diagnosis includes COPD exacerbation, enteritis, colitis, dehydration, pyelonephritis, pneumonia   Co morbidities that complicate the patient evaluation  hypothyroidism, HLD, HTN, PAD, CVA, COPD, anemia   Additional history obtained:  Additional history obtained from patient's family External records from outside source obtained and reviewed including EMR   Lab Tests:  I Ordered, and personally interpreted labs.  The pertinent results include: A leukocytosis is present.  Hemoglobin is slightly down trended from baseline.  Electrolytes are normal.  Urinalysis is consistent with UTI.   Imaging Studies ordered:  I ordered imaging studies including chest x-ray I independently visualized and interpreted imaging which showed phonic emphysematous changes, trace bilateral pleural effusions, no focal opacities I agree with the radiologist interpretation   Cardiac Monitoring: / EKG:  The patient was maintained on a cardiac monitor.  I personally viewed and interpreted the cardiac monitored which showed an underlying rhythm of: Sinus  rhythm   Problem List / ED Course / Critical interventions / Medication management  Patient is a pleasant 87 year old female presenting for multiple complaints.  Among these are ongoing diarrhea for  the past 2 weeks and recent fatigue, generalized weakness, and cough that is productive of yellow sputum.  Per chart review, results of C. difficile and GIP panel from 4 days ago were negative.  Vital signs on arrival today are notable for tachypnea.  Prior to being bedded in the ED, patient underwent initial lab work.  Lab work shows leukocytosis and UTI.  On initial assessment, patient is alert and oriented.  She remains tachypneic.  She has diffuse wheezing on lung auscultation.  She has no abdominal or CVA tenderness.  She has global weakness and has difficult time even sitting up in the bed.  She does feel warm to the touch.  On check of rectal temperature, patient has a fever of 100.8 degrees.  Given clear evidence of UTI with systemic symptoms, patient was treated for pyelonephritis with IV fluids and ceftriaxone.  Additionally, given her wheezing and increased sputum production with cough, patient received treatment for COPD exacerbation.  Given her current infection and ongoing diarrhea, patient is likely dehydrated as well.  Following initial bolus of IV fluids, patient continued to endorse fatigue.  At this time, she also developed nausea.  Zofran and additional IV fluids were ordered.  Given her age and severity of current illness, patient was admitted to hospitalist. I ordered medication including IV fluids and ceftriaxone for pyelonephritis; antibiotics, prednisone, and DuoNeb for COPD exacerbation; Zofran for nausea Reevaluation of the patient after these medicines showed that the patient improved I have reviewed the patients home medicines and have made adjustments as needed   Social Determinants of Health:  Has PCP         Final Clinical Impression(s) / ED Diagnoses Final diagnoses:  Pyelonephritis  COPD exacerbation ([place])    Rx / DC Orders ED Discharge Orders     None         Godfrey Pick, MD 11/01/21 2335

## 2021-11-01 NOTE — ED Triage Notes (Signed)
Pt arrived POV. Pt c/o diarrhea since August and productive cough which worsened over the past 2 weeks.

## 2021-11-02 ENCOUNTER — Encounter (HOSPITAL_COMMUNITY): Payer: Self-pay | Admitting: Internal Medicine

## 2021-11-02 DIAGNOSIS — Z886 Allergy status to analgesic agent status: Secondary | ICD-10-CM | POA: Diagnosis not present

## 2021-11-02 DIAGNOSIS — Z8619 Personal history of other infectious and parasitic diseases: Secondary | ICD-10-CM | POA: Diagnosis not present

## 2021-11-02 DIAGNOSIS — Z79899 Other long term (current) drug therapy: Secondary | ICD-10-CM | POA: Diagnosis not present

## 2021-11-02 DIAGNOSIS — K219 Gastro-esophageal reflux disease without esophagitis: Secondary | ICD-10-CM | POA: Diagnosis present

## 2021-11-02 DIAGNOSIS — F17210 Nicotine dependence, cigarettes, uncomplicated: Secondary | ICD-10-CM | POA: Diagnosis present

## 2021-11-02 DIAGNOSIS — Z20822 Contact with and (suspected) exposure to covid-19: Secondary | ICD-10-CM | POA: Diagnosis present

## 2021-11-02 DIAGNOSIS — I739 Peripheral vascular disease, unspecified: Secondary | ICD-10-CM | POA: Diagnosis present

## 2021-11-02 DIAGNOSIS — D649 Anemia, unspecified: Secondary | ICD-10-CM | POA: Diagnosis present

## 2021-11-02 DIAGNOSIS — J208 Acute bronchitis due to other specified organisms: Secondary | ICD-10-CM | POA: Diagnosis not present

## 2021-11-02 DIAGNOSIS — I10 Essential (primary) hypertension: Secondary | ICD-10-CM | POA: Diagnosis present

## 2021-11-02 DIAGNOSIS — Z823 Family history of stroke: Secondary | ICD-10-CM | POA: Diagnosis not present

## 2021-11-02 DIAGNOSIS — Z79890 Hormone replacement therapy (postmenopausal): Secondary | ICD-10-CM | POA: Diagnosis not present

## 2021-11-02 DIAGNOSIS — B9689 Other specified bacterial agents as the cause of diseases classified elsewhere: Secondary | ICD-10-CM | POA: Diagnosis present

## 2021-11-02 DIAGNOSIS — Z8673 Personal history of transient ischemic attack (TIA), and cerebral infarction without residual deficits: Secondary | ICD-10-CM | POA: Diagnosis not present

## 2021-11-02 DIAGNOSIS — J440 Chronic obstructive pulmonary disease with acute lower respiratory infection: Secondary | ICD-10-CM | POA: Diagnosis present

## 2021-11-02 DIAGNOSIS — E782 Mixed hyperlipidemia: Secondary | ICD-10-CM

## 2021-11-02 DIAGNOSIS — N390 Urinary tract infection, site not specified: Secondary | ICD-10-CM | POA: Diagnosis present

## 2021-11-02 DIAGNOSIS — H547 Unspecified visual loss: Secondary | ICD-10-CM | POA: Diagnosis present

## 2021-11-02 DIAGNOSIS — J441 Chronic obstructive pulmonary disease with (acute) exacerbation: Secondary | ICD-10-CM

## 2021-11-02 DIAGNOSIS — Z7901 Long term (current) use of anticoagulants: Secondary | ICD-10-CM | POA: Diagnosis not present

## 2021-11-02 DIAGNOSIS — E039 Hypothyroidism, unspecified: Secondary | ICD-10-CM | POA: Diagnosis present

## 2021-11-02 DIAGNOSIS — J209 Acute bronchitis, unspecified: Secondary | ICD-10-CM | POA: Diagnosis present

## 2021-11-02 DIAGNOSIS — N3000 Acute cystitis without hematuria: Secondary | ICD-10-CM | POA: Diagnosis present

## 2021-11-02 DIAGNOSIS — R197 Diarrhea, unspecified: Secondary | ICD-10-CM | POA: Diagnosis not present

## 2021-11-02 DIAGNOSIS — Z7951 Long term (current) use of inhaled steroids: Secondary | ICD-10-CM | POA: Diagnosis not present

## 2021-11-02 DIAGNOSIS — E872 Acidosis, unspecified: Secondary | ICD-10-CM | POA: Diagnosis present

## 2021-11-02 DIAGNOSIS — Z888 Allergy status to other drugs, medicaments and biological substances status: Secondary | ICD-10-CM | POA: Diagnosis not present

## 2021-11-02 DIAGNOSIS — Z885 Allergy status to narcotic agent status: Secondary | ICD-10-CM | POA: Diagnosis not present

## 2021-11-02 DIAGNOSIS — E038 Other specified hypothyroidism: Secondary | ICD-10-CM

## 2021-11-02 LAB — COMPREHENSIVE METABOLIC PANEL
ALT: 22 U/L (ref 0–44)
AST: 33 U/L (ref 15–41)
Albumin: 2.4 g/dL — ABNORMAL LOW (ref 3.5–5.0)
Alkaline Phosphatase: 78 U/L (ref 38–126)
Anion gap: 9 (ref 5–15)
BUN: 8 mg/dL (ref 8–23)
CO2: 21 mmol/L — ABNORMAL LOW (ref 22–32)
Calcium: 8.2 mg/dL — ABNORMAL LOW (ref 8.9–10.3)
Chloride: 108 mmol/L (ref 98–111)
Creatinine, Ser: 0.6 mg/dL (ref 0.44–1.00)
GFR, Estimated: >60 mL/min (ref >60)
Glucose, Bld: 142 mg/dL — ABNORMAL HIGH (ref 70–99)
Potassium: 4.3 mmol/L (ref 3.5–5.1)
Sodium: 138 mmol/L (ref 135–145)
Total Bilirubin: 0.5 mg/dL (ref 0.3–1.2)
Total Protein: 5.8 g/dL — ABNORMAL LOW (ref 6.5–8.1)

## 2021-11-02 LAB — CBC WITH DIFFERENTIAL/PLATELET
Abs Immature Granulocytes: 0.04 10*3/uL (ref 0.00–0.07)
Basophils Absolute: 0 10*3/uL (ref 0.0–0.1)
Basophils Relative: 0 %
Eosinophils Absolute: 0 10*3/uL (ref 0.0–0.5)
Eosinophils Relative: 0 %
HCT: 34 % — ABNORMAL LOW (ref 36.0–46.0)
Hemoglobin: 10.9 g/dL — ABNORMAL LOW (ref 12.0–15.0)
Immature Granulocytes: 0 %
Lymphocytes Relative: 4 %
Lymphs Abs: 0.4 10*3/uL — ABNORMAL LOW (ref 0.7–4.0)
MCH: 26.1 pg (ref 26.0–34.0)
MCHC: 32.1 g/dL (ref 30.0–36.0)
MCV: 81.3 fL (ref 80.0–100.0)
Monocytes Absolute: 0.3 10*3/uL (ref 0.1–1.0)
Monocytes Relative: 3 %
Neutro Abs: 9.3 10*3/uL — ABNORMAL HIGH (ref 1.7–7.7)
Neutrophils Relative %: 93 %
Platelets: 262 10*3/uL (ref 150–400)
RBC: 4.18 MIL/uL (ref 3.87–5.11)
RDW: 17.3 % — ABNORMAL HIGH (ref 11.5–15.5)
WBC: 10 10*3/uL (ref 4.0–10.5)
nRBC: 0 % (ref 0.0–0.2)

## 2021-11-02 LAB — MAGNESIUM: Magnesium: 2 mg/dL (ref 1.7–2.4)

## 2021-11-02 MED ORDER — MELATONIN 5 MG PO TABS
5.0000 mg | ORAL_TABLET | Freq: Every evening | ORAL | Status: DC | PRN
  Administered 2021-11-02: 5 mg via ORAL
  Filled 2021-11-02: qty 1

## 2021-11-02 MED ORDER — ACETAMINOPHEN 325 MG PO TABS: 650.0000 mg | ORAL_TABLET | Freq: Four times a day (QID) | ORAL | Status: DC | PRN

## 2021-11-02 MED ORDER — LEVOTHYROXINE SODIUM 50 MCG PO TABS: 50.0000 ug | ORAL_TABLET | ORAL | Status: DC

## 2021-11-02 MED ORDER — LEVOTHYROXINE SODIUM 100 MCG PO TABS
100.0000 ug | ORAL_TABLET | ORAL | Status: DC
  Administered 2021-11-03: 100 ug via ORAL
  Filled 2021-11-02: qty 1

## 2021-11-02 MED ORDER — HYDRALAZINE HCL 20 MG/ML IJ SOLN: 10.0000 mg | Freq: Four times a day (QID) | INTRAMUSCULAR | Status: DC | PRN

## 2021-11-02 MED ORDER — LEVOTHYROXINE SODIUM 50 MCG PO TABS: 50.0000 ug | ORAL_TABLET | Freq: Every day | ORAL | Status: DC

## 2021-11-02 MED ORDER — PREDNISONE 20 MG PO TABS
40.0000 mg | ORAL_TABLET | Freq: Every day | ORAL | Status: DC
  Administered 2021-11-02 – 2021-11-03 (×2): 40 mg via ORAL
  Filled 2021-11-02 (×2): qty 2

## 2021-11-02 MED ORDER — IPRATROPIUM-ALBUTEROL 0.5-2.5 (3) MG/3ML IN SOLN
3.0000 mL | Freq: Four times a day (QID) | RESPIRATORY_TRACT | Status: DC
  Administered 2021-11-02: 3 mL via RESPIRATORY_TRACT
  Filled 2021-11-02: qty 3

## 2021-11-02 MED ORDER — SODIUM CHLORIDE 0.9 % IV SOLN
1.0000 g | INTRAVENOUS | Status: DC
  Administered 2021-11-02: 1 g via INTRAVENOUS
  Filled 2021-11-02: qty 10

## 2021-11-02 MED ORDER — ACETAMINOPHEN 650 MG RE SUPP: 650.0000 mg | Freq: Four times a day (QID) | RECTAL | Status: DC | PRN

## 2021-11-02 MED ORDER — FLUTICASONE FUROATE-VILANTEROL 200-25 MCG/ACT IN AEPB
1.0000 | INHALATION_SPRAY | Freq: Every day | RESPIRATORY_TRACT | Status: DC
  Administered 2021-11-03: 1 via RESPIRATORY_TRACT
  Filled 2021-11-02: qty 28

## 2021-11-02 MED ORDER — RIVAROXABAN 2.5 MG PO TABS
2.5000 mg | ORAL_TABLET | Freq: Two times a day (BID) | ORAL | Status: DC
  Administered 2021-11-02 – 2021-11-03 (×3): 2.5 mg via ORAL
  Filled 2021-11-02 (×4): qty 1

## 2021-11-02 MED ORDER — AZITHROMYCIN 250 MG PO TABS: 500.0000 mg | ORAL_TABLET | Freq: Every day | ORAL | Status: DC

## 2021-11-02 MED ORDER — ASPIRIN 81 MG PO TBEC
81.0000 mg | DELAYED_RELEASE_TABLET | Freq: Every day | ORAL | Status: DC
  Administered 2021-11-02 – 2021-11-03 (×2): 81 mg via ORAL
  Filled 2021-11-02 (×2): qty 1

## 2021-11-02 MED ORDER — ONDANSETRON HCL 4 MG/2ML IJ SOLN: 4.0000 mg | Freq: Four times a day (QID) | INTRAMUSCULAR | Status: DC | PRN

## 2021-11-02 MED ORDER — ONDANSETRON HCL 4 MG PO TABS: 4.0000 mg | ORAL_TABLET | Freq: Four times a day (QID) | ORAL | Status: DC | PRN

## 2021-11-02 MED ORDER — ENOXAPARIN SODIUM 40 MG/0.4ML IJ SOSY: 40.0000 mg | PREFILLED_SYRINGE | INTRAMUSCULAR | Status: DC

## 2021-11-02 MED ORDER — SODIUM CHLORIDE 0.9 % IV SOLN: 500.0000 mg | INTRAVENOUS | Status: DC

## 2021-11-02 MED ORDER — AZITHROMYCIN 250 MG PO TABS
500.0000 mg | ORAL_TABLET | Freq: Every day | ORAL | Status: DC
  Administered 2021-11-02: 500 mg via ORAL
  Filled 2021-11-02: qty 2

## 2021-11-02 MED ORDER — IPRATROPIUM-ALBUTEROL 0.5-2.5 (3) MG/3ML IN SOLN: 3.0000 mL | RESPIRATORY_TRACT | Status: DC | PRN

## 2021-11-02 MED ORDER — ROSUVASTATIN CALCIUM 20 MG PO TABS
20.0000 mg | ORAL_TABLET | Freq: Every day | ORAL | Status: DC
  Administered 2021-11-02 – 2021-11-03 (×2): 20 mg via ORAL
  Filled 2021-11-02 (×2): qty 1

## 2021-11-02 MED ORDER — PANTOPRAZOLE SODIUM 40 MG PO TBEC
40.0000 mg | DELAYED_RELEASE_TABLET | Freq: Every day | ORAL | Status: DC
  Administered 2021-11-02 – 2021-11-03 (×2): 40 mg via ORAL
  Filled 2021-11-02 (×2): qty 1

## 2021-11-02 MED ORDER — NICOTINE POLACRILEX 2 MG MT GUM: 2.0000 mg | CHEWING_GUM | OROMUCOSAL | Status: DC | PRN

## 2021-11-02 MED ORDER — POLYETHYLENE GLYCOL 3350 17 G PO PACK: 17.0000 g | PACK | Freq: Every day | ORAL | Status: DC | PRN

## 2021-11-02 NOTE — ED Notes (Signed)
[facility] arrived to transport pt. Pt stable at time of departure ?

## 2021-11-02 NOTE — Assessment & Plan Note (Signed)
·   Please see assessment and plan above °

## 2021-11-02 NOTE — Evaluation (Signed)
Physical Therapy Evaluation and Discharge Patient Details Name: Jill Franco MRN: [id number] DOB: 02/29/1934 Today's Date: 11/02/2021  History of Present Illness  Pt is an 87 y.o. F who presents 11/01/2021 with complaints of diarrhea and cough. Found to be febrile 100.8 F. Urinalysis was obtained and found to be suggestive of a urinary tract infection. Admitted with acute cystitis wtihout hematruia, acute bacterial bronchitis, COPD with acute exacerbation. PMH includes COPD, CVA, HTN, PVD, vision loss.  Clinical Impression  Patient evaluated by Physical Therapy with no further acute PT needs identified. Pt overall appears fairly close to her functional baseline. PTA, pt lives with her family and is a household ambulator with no AD. Pt ambulating 300 ft with no assistive device at a supervision level. HR 85-126 bpm, SpO2 96% on RA. Pt displays decreased cardiopulmonary endurance. Discussed activity recommendations and progression. Would benefit from daily mobilization while inpatient. All education has been completed and the patient has no further questions. No follow-up Physical Therapy or equipment needs. PT is signing off. Thank you for this referral.      Recommendations for follow up therapy are one component of a multi-disciplinary discharge planning process, led by the attending physician.  Recommendations may be updated based on patient status, additional functional criteria and insurance authorization.  Follow Up Recommendations No PT follow up      Assistance Recommended at Discharge PRN  Patient can return home with the following  Assistance with cooking/housework;Assist for transportation;Help with stairs or ramp for entrance    Equipment Recommendations None recommended by PT  Recommendations for Other Services       Functional Status Assessment Patient has had a recent decline in their functional status and demonstrates the ability to make significant improvements in  function in a reasonable and predictable amount of time.     Precautions / Restrictions Precautions Precautions: Fall Precaution Comments: mild fall risk Restrictions Weight Bearing Restrictions: No      Mobility  Bed Mobility Overal bed mobility: Modified Independent                  Transfers Overall transfer level: Independent Equipment used: None                    Ambulation/Gait Ambulation/Gait assistance: Supervision Gait Distance (Feet): 300 Feet Assistive device: None Gait Pattern/deviations: Step-through pattern, Decreased stride length, Drifts right/left Gait velocity: decreased     General Gait Details: Mild drift right/left, supervision overall for safety. required one seated rest break  Stairs            Wheelchair Mobility    Modified Rankin (Stroke Patients Only)       Balance Overall balance assessment: Mild deficits observed, not formally tested                                           Pertinent Vitals/Pain Pain Assessment Pain Assessment: No/denies pain    Home Living Family/patient expects to be discharged to:: Private residence Living Arrangements: Children Available Help at Discharge: Family;Available 24 hours/day Type of Home: House Home Access: Ramped entrance       Home Layout: One level Home Equipment: Animator (2 wheels);Crutches;Wheelchair - manual      Prior Function Prior Level of Function : Independent/Modified Independent;Driving  Hand Dominance   Dominant Hand: Right    Extremity/Trunk Assessment   Upper Extremity Assessment Upper Extremity Assessment: Defer to OT evaluation    Lower Extremity Assessment Lower Extremity Assessment: Overall WFL for tasks assessed    Cervical / Trunk Assessment Cervical / Trunk Assessment: Normal  Communication   Communication: HOH  Cognition Arousal/Alertness: Awake/alert Behavior  During Therapy: WFL for tasks assessed/performed Overall Cognitive Status: Within Functional Limits for tasks assessed                                          General Comments      Exercises     Assessment/Plan    PT Assessment Patient does not need any further PT services  PT Problem List Decreased strength;Decreased activity tolerance;Decreased balance;Decreased mobility;Cardiopulmonary status limiting activity       PT Treatment Interventions      PT Goals (Current goals can be found in the Care Plan section)  Acute Rehab PT Goals Patient Stated Goal: feel better PT Goal Formulation: All assessment and education complete, DC therapy    Frequency       Co-evaluation               AM-PAC PT "6 Clicks" Mobility  Outcome Measure Help needed turning from your back to your side while in a flat bed without using bedrails?: None Help needed moving from lying on your back to sitting on the side of a flat bed without using bedrails?: None Help needed moving to and from a bed to a chair (including a wheelchair)?: None Help needed standing up from a chair using your arms (e.g., wheelchair or bedside chair)?: None Help needed to walk in hospital room?: A Little Help needed climbing 3-5 steps with a railing? : A Little 6 Click Score: 22    End of Session   Activity Tolerance: Patient tolerated treatment well Patient left: in bed;with call bell/phone within reach Nurse Communication: Mobility status PT Visit Diagnosis: Unsteadiness on feet (R26.81)    Time: 1540-0867 PT Time Calculation (min) (ACUTE ONLY): 15 min   Charges:   PT Evaluation $PT Eval Low Complexity: 1 Low          Wyona Almas, PT, DPT Acute Rehabilitation Services Office [phone]   Deno Etienne 11/02/2021, 5:00 PM

## 2021-11-02 NOTE — Assessment & Plan Note (Signed)
   Patient admits to ongoing smoking  Counseling on cessation  Patient agrees to nicotine replacement therapy with as needed gum

## 2021-11-02 NOTE — Assessment & Plan Note (Signed)
-   Continue home Synthroid °

## 2021-11-02 NOTE — Progress Notes (Signed)
Care started prior to midnight in the emergency room and patient was admitted early this morning after midnight by Dr. Inda Merlin and I am in current agreement with his assessment and plan.  Additional changes to plan of care been made accordingly.  Patient is an elderly 87 year old Caucasian female with a past medical history significant for prolonged to COPD, GERD, hyperlipidemia, hypothyroidism, nicotine dependence, peripheral vascular disease as well as other comorbidities who presented from [facility] with complaints of diarrhea and cough.  For last 2 to 3 weeks she has been experiencing diarrhea and diarrhea was watery and nonbloody and occurred 5-6 times a day.  Diarrhea has been associated with diffuse abdominal cramping is moderate to mild in intensity and she denies any fevers or sick contacts.  Symptoms continued to persist for the following 2 to 3 weeks that she went to her primary care provider and she underwent stool sample testing with C. difficile and GI pathogen panel which were both negative.  She also had 1 occasion where she felt extremely lightheaded and eventually lost consciousness.  In addition to her symptoms last.  She began experiencing new onset cough that is productive yellow sputum.  She also did experience shortness of breath with mild to moderate intensity worse with exertion and improved with rest.  Subsequent she is also now complaining of dysuria with lower abdominal discomfort last 24 hours prior to presentation.  Given these constellation of symptoms she went to the [facility] ED department for further evaluation and in the ED she is found to be febrile 100.8.  UA was done and was suggestive of a urinary tract infection.  Chest x-ray revealed no evidence of pneumonia but the EDP felt that she been additionally suffering from acute bacterial bronchitis so she has been placed on a combination of IV ceftriaxone and azithromycin to cover both possibilities  and she is transferred to the [facility] to the hospital service for continued medical care and further work-up.  Currently she is being admitted for the following but limited to:  Acute cystitis without hematuria Patient experiencing lower abdominal discomfort and dysuria for at least 24 hours prior to presentation Urinalysis suggestive of urinary tract infection Patient has been initiated on intravenous ceftriaxone by the emergency department staff which will be continued at this time Urine cultures obtained, will de-escalate antibiotic therapy based on these results   COPD with acute exacerbation [facility]) Patient is been experiencing 2 to 3-day history of increasing cough productive of yellow sputum and dyspnea on exertion Physical examination consistent with mild COPD exacerbation with likely superimposed acute bacterial bronchitis Chest x-ray reveals no evidence of pneumonia Patient has been placed on scheduled bronchodilator therapy Patient is additionally being treated with azithromycin for acute bacterial bronchitis This exacerbation is rather mild and chemically simply be treated with scheduled bronchodilator therapy and treatment of the associated bacterial bronchitis.   Treating with short course of 40 mg of prednisone daily.   Acute bacterial bronchitis Please see assessment and plan above   Acute diarrhea Patient reports a 2-week history of diarrhea That being said, patient also reports having been constipated just proceeding this course of diarrhea and reports taking numerous laxatives over the course of several days.  This is likely the culprit of the patient's diarrhea Patient reports that her diarrheal symptoms seem to have been resolving rather quickly in the past several days Infectious work-up performed in the outpatient setting the other day was negative Supportive care  Hypothyroidism Resume home regimen of Synthroid   GERD without esophagitis Continuing  home regimen of daily PPI therapy.   Mixed hyperlipidemia Continuing home regimen of lipid lowering therapy.    Nicotine dependence, cigarettes, uncomplicated Patient admits to ongoing smoking Counseling on cessation Patient agrees to nicotine replacement therapy with as needed gum   Peripheral artery disease ([place]) Continue outpatient regimen of anticoagulation  Metabolic Acidosis -She has a slight metabolic acidosis with a CO2 of 21, anion gap of 9, chloride level of 108 -Continue monitor and trend and repeat CMP in a.m.  Hypoalbuminemia -Patient's albumin level is now 2.4 Continue monitor and trend and repeat CMP in a.m.  Normocytic Anemia -Patient's hemoglobin/hematocrit has been slowly dropping and went from 12.8/39.3 -> 11.7/37.7 -> 10.9/34.0 -Check anemia panel in a.m.  -Continue to monitor for signs and symptoms of bleeding; no overt bleeding noted We will repeat CBC in a.m.   We will continue to monitor the patient with clinical response to intervention and repeat blood work in the a.m.

## 2021-11-02 NOTE — Assessment & Plan Note (Signed)
   Continue outpatient regimen of anticoagulation

## 2021-11-02 NOTE — H&P (Signed)
History and Physical    Patient: Jill Franco MRN: [id number] DOA: 11/01/2021  Date of Service: the patient was seen and examined on 11/02/2021  Patient coming from: Home via [place]  Chief Complaint:  Chief Complaint  Patient presents with   Cough    HPI:   87 year old female with past medical history of COPD, gastroesophageal reflux disease, hyperlipidemia, hypothyroidism, nicotine dependence, peripheral vascular disease who presents to [facility] emergency department with complaints of diarrhea and cough.  Patient explains that over the past 2 to 3 weeks she has been experiencing diarrhea.  Diarrhea is watery, nonbloody and occurring 5-6 times daily.  Diarrhea is been associated with diffuse abdominal cramping that is mild to moderate intensity.  Patient denies associated fevers, sick contacts, recent ingestion of undercooked food, recent travel or recent antibiotic use.  Patient's symptoms continue to persist over the following 2 to 3 weeks.  Patient followed up with her primary care provider due to ongoing symptoms.  At that time patient underwent a stool work-up including C. difficile and GI pathogen panel all which was negative.  And on 1 occasion states that she felt extremely lightheadedness and eventually lost consciousness.  In addition to the symptoms the past several days she also has been experiencing a new onset cough that is productive of yellow sputum.  Patient has also been experiencing shortness of breath, mild to moderate intensity, worse with exertion and improved with rest.  Patient denies any associated chest pain or fevers.  Finally, patient is also been experiencing dysuria with lower abdominal discomfort for the past 24 hours prior to her presentation.  Due to patient's progressively worsening constellation of symptoms she eventually presented to [facility] emergency department for evaluation.  Upon evaluation in the emergency department patient was  found to be febrile 100.8 F .  Urinalysis was obtained and found to be suggestive of a urinary tract infection.  Chest x-ray revealed no evidence of pneumonia however EDP felt the patient was additionally suffering from an acute bacterial bronchitis.  EDP therefore placed patient on a combination of intravenous ceftriaxone and azithromycin to cover both possibilities.  The hospitalist group was called and patient was excepted for transfer to [facility] for continued medical care.  Review of Systems: Review of Systems  Constitutional:  Positive for malaise/fatigue.  Respiratory:  Positive for cough.   Neurological:  Positive for weakness.  All other systems reviewed and are negative.    Past Medical History:  Diagnosis Date   Cataract    bilaterally corrected.   COPD (chronic obstructive pulmonary disease) ([place])    CVA (cerebral vascular accident) ([place])    Pt was unaware and does not know when it happened.  Was told after some testing was done.   Double vision    Dyspnea    Facial droop 10/2017   left   History of shingles 2012   HLD (hyperlipidemia)    HTN (hypertension)    Hypothyroidism    PVD (peripheral vascular disease) ([place])    Vision loss    temporary    Past Surgical History:  Procedure Laterality Date   ABDOMINAL AORTOGRAM N/A 04/30/2021   Procedure: ABDOMINAL AORTOGRAM;  Surgeon: [name], MD;  Location: [place] [facility];  Service: Cardiovascular;  Laterality: N/A;   ABDOMINAL EXPLORATION SURGERY  1950s   "they thought I was pregnant; went in to explore"   APPENDECTOMY  childhood   BACK SURGERY     BREAST  LUMPECTOMY Left 1960's   benign   carotid ultrasound  06/19/2007   0-39% stenosis   CATARACT EXTRACTION W/ INTRAOCULAR LENS IMPLANT Left 09/2016   CATARACT EXTRACTION W/ INTRAOCULAR LENS IMPLANT Right 10/2016   COLONOSCOPY WITH PROPOFOL N/A 04/08/2019   Procedure: COLONOSCOPY WITH PROPOFOL;  Surgeon: Milus Banister, MD;  Location: [facility]  ENDOSCOPY;  Service: Endoscopy;  Laterality: N/A;   DILATION AND CURETTAGE OF UTERUS  1960's X 3   miscarriages   DOPPLER ECHOCARDIOGRAPHY  06/19/2007   Normal EF 55-70%   ENDARTERECTOMY FEMORAL Left 12/03/2020   Procedure: LEFT FEMORAL ENDARTERECTOMY AND PROFUNDAPLASTY;  Surgeon: [name], MD;  Location: [facility] OR;  Service: Vascular;  Laterality: Left;   ILIAC ARTERY STENT Left 03/18/2016   common iliac stent (6 x 40), aortic stent (10 x 19)/notes 03/18/2016   INSERTION OF ILIAC STENT Left 12/03/2020   Procedure: AORTIC STENTING, INSERTION OF BILATERAL COMMON ILIAC STENTS, AND LEFT EXTERNAL ILIAC STENT;  Surgeon: [name], MD;  Location: [place];  Service: Vascular;  Laterality: Left;   LAMINECTOMY AND MICRODISCECTOMY SPINE  1960's X 2   LOWER EXTREMITY ANGIOGRAM N/A 12/03/2020   Procedure: Andris Baumann;  Surgeon: [name], MD;  Location: [place];  Service: Vascular;  Laterality: N/A;   MOLE REMOVAL  07/10/2016   NSVD     x1   PATCH ANGIOPLASTY Left 12/03/2020   Procedure: PATCH ANGIOPLASTY OF LEFT COMMON FEMORAL ARTERY USING BOVINE PERICARDIUM PATCH;  Surgeon: [name], MD;  Location: [place];  Service: Vascular;  Laterality: Left;   PERIPHERAL VASCULAR BALLOON ANGIOPLASTY Right 04/30/2021   Procedure: PERIPHERAL VASCULAR BALLOON ANGIOPLASTY;  Surgeon: [name], MD;  Location: [place] [facility];  Service: Cardiovascular;  Laterality: Right;  rt common iliac   PERIPHERAL VASCULAR CATHETERIZATION N/A 03/18/2016   Procedure: Abdominal Aortogram w/Lower Extremity;  Surgeon: Elam Dutch, MD;  Location: [place] [facility];  Service: Cardiovascular;  Laterality: N/A;   PERIPHERAL VASCULAR CATHETERIZATION  03/18/2016   Procedure: Peripheral Vascular Intervention;  Surgeon: Elam Dutch, MD;  Location: [place] [facility];  Service: Cardiovascular;;   PERIPHERAL VASCULAR INTERVENTION Left 04/30/2021   Procedure: PERIPHERAL VASCULAR INTERVENTION;  Surgeon:  [name], MD;  Location: [place] [facility];  Service: Cardiovascular;  Laterality: Left;  lt common iliac   POLYPECTOMY  04/08/2019   Procedure: POLYPECTOMY;  Surgeon: Milus Banister, MD;  Location: [facility] ENDOSCOPY;  Service: Endoscopy;;   ULTRASOUND GUIDANCE FOR VASCULAR ACCESS Right 12/03/2020   Procedure: ULTRASOUND GUIDANCE FOR VASCULAR ACCESS, RIGHT FEMORAL ARTERY;  Surgeon: [name], MD;  Location: [place] OR;  Service: Vascular;  Laterality: Right;    Social History:  reports that she quit smoking about 9 months ago. Her smoking use included cigarettes. She has a 17.25 pack-year smoking history. She has been exposed to tobacco smoke. She has never used smokeless tobacco. She reports that she does not drink alcohol and does not use drugs.  Allergies  Allergen Reactions   Aspirin Other (See Comments)    REACTION: Uncoated Stomach upset on empty stomach   Codeine Nausea Only and Other (See Comments)    Pill needs to be E.coated   Ibuprofen Nausea And Vomiting   Lipitor [Atorvastatin] Other (See Comments)    myalgia   Other Itching and Rash    Nicotine patches     Family History  Problem Relation Age of Onset   Stroke Father 74   Cancer  Brother    Colon cancer Neg Hx    Breast cancer Neg Hx    Colon polyps Neg Hx    Esophageal cancer Neg Hx    Rectal cancer Neg Hx    Stomach cancer Neg Hx     Prior to Admission medications   Medication Sig Start Date End Date Taking? Authorizing Provider  albuterol (VENTOLIN HFA) 108 (90 Base) MCG/ACT inhaler USE 2 INHALATIONS BY MOUTH  INTO THE LUNGS EVERY 6  HOURS AS NEEDED FOR  WHEEZING OR SHORTNESS OF  BREATH 09/28/21   Tonia Ghent, MD  alendronate (FOSAMAX) 70 MG tablet Take 1 tablet (70 mg total) by mouth every 7 (seven) days. Take with a full glass of water on an empty stomach. 09/24/21   Tonia Ghent, MD  aspirin EC 81 MG tablet Take 1 tablet (81 mg total) by mouth daily. Swallow whole. 03/29/21   Cantwell, Celeste C,  PA-C  budesonide-formoterol (SYMBICORT) 160-4.5 MCG/ACT inhaler Inhale 2 puffs into the lungs in the morning and at bedtime. 05/11/21   Tonia Ghent, MD  Cholecalciferol (VITAMIN D3) 25 MCG (1000 UT) CAPS Take 1 capsule (1,000 Units total) by mouth daily. 10/25/20   Tonia Ghent, MD  diclofenac Sodium (VOLTAREN) 1 % GEL Apply 2 g topically 4 (four) times daily. 09/22/21   Michela Pitcher, NP  furosemide (LASIX) 20 MG tablet TAKE 2 TABLETS BY MOUTH IN THE  MORNING 10/26/21   Tonia Ghent, MD  levothyroxine (SYNTHROID) 50 MCG tablet TAKE 1 TABLET BY MOUTH  DAILY EXCEPT 2 TABLETS ON  SUNDAYS AND WEDNESDAYS (  TOTAL 9 TABS PER WEEK) 07/12/21   Tonia Ghent, MD  melatonin 5 MG TABS Take 5 mg by mouth at bedtime as needed.    [provider]  metoprolol tartrate (LOPRESSOR) 100 MG tablet TAKE ONE-HALF TABLET BY  MOUTH IN THE MORNING AND 1  TABLET BY MOUTH IN THE  EVENING IF BP IS ABOVE 140/90. 10/28/21   Tonia Ghent, MD  potassium chloride SA (KLOR-CON M) 20 MEQ tablet Take 1 tablet (20 mEq total) by mouth 2 (two) times daily. 10/28/21   Tonia Ghent, MD  rivaroxaban (XARELTO) 2.5 MG TABS tablet Take 1 tablet (2.5 mg total) by mouth 2 (two) times daily. 08/18/21   Tonia Ghent, MD  rosuvastatin (CRESTOR) 20 MG tablet Take 1 tablet (20 mg total) by mouth daily. 09/24/21 09/19/22  Tonia Ghent, MD  tiZANidine (ZANAFLEX) 4 MG tablet Take 0.5-1 tablets (2-4 mg total) by mouth 2 (two) times daily as needed for muscle spasms (sedation caution). 06/30/21   Tonia Ghent, MD    Physical Exam:  Vitals:   11/02/21 0150 11/02/21 0200 11/02/21 0302 11/02/21 0522  BP:  (!) 150/71 (!) 152/85 (!) 167/86  Pulse: 83 77 82 72  Resp: (!) 24 (!) '22 18 18  '$ Temp:  98.3 F (36.8 C) 98.6 F (37 C) 98.1 F (36.7 C)  TempSrc:   Oral Oral  SpO2: 95% 92% 94% 94%  Weight:      Height:        Constitutional: Awake alert and oriented x3, no associated distress.   Skin: no rashes, no lesions,  good skin turgor noted. Eyes: Pupils are equally reactive to light.  No evidence of scleral icterus or conjunctival pallor.  ENMT: Moist mucous membranes noted.  Posterior pharynx clear of any exudate or lesions.   Neck: normal, supple, no masses, no thyromegaly.  No evidence of jugular venous distension.   Respiratory: Mild bibasilar rales with intermittent expiratory wheezing and prolonged expiratory phase.  Normal respiratory effort. No accessory muscle use.  Cardiovascular: Regular rate and rhythm, no murmurs / rubs / gallops. No extremity edema. 2+ pedal pulses. No carotid bruits.  Chest:   Nontender without crepitus or deformity.   Back:   Nontender without crepitus or deformity. Abdomen: Mild lower abdominal tenderness.  Abdomen is soft.  No evidence of intra-abdominal masses.  Positive bowel sounds noted in all quadrants.   Musculoskeletal: No joint deformity upper and lower extremities. Good ROM, no contractures. Normal muscle tone.  Neurologic: CN 2-12 grossly intact. Sensation intact.  Patient moving all 4 extremities spontaneously.  Patient is following all commands.  Patient is responsive to verbal stimuli.   Psychiatric: Patient exhibits normal mood with appropriate affect.  Patient seems to possess insight as to their current situation.    Data Reviewed:  I have personally reviewed and interpreted labs, imaging.  Significant findings are:  Urinalysis: Greater than 50 white blood cells per high-powered field.  Positive nitrites, large leukocyte esterase.  Lab Results  Component Value Date   WBC 11.8 (H) 11/01/2021   HGB 11.7 (L) 11/01/2021   HCT 37.7 11/01/2021   MCV 82.7 11/01/2021   PLT 310 11/01/2021   Lab Results  Component Value Date   K 4.4 11/01/2021   Lab Results  Component Value Date   BUN 12 11/01/2021   Lab Results  Component Value Date   CREATININE 0.80 11/01/2021    CXR:   Chest X-ray was personally reviewed.  No evidence of focal infiltrates.   Trace bilateral pleural effusions.  Some coarse interstitial markings noted.  No evidence of pneumothorax.    EKG: Personally reviewed.  Rhythm is   Normal sinus rhythm with heart rate of 95bpm.  Frequent PAC's.  No dynamic ST segment changes appreciated.   Assessment and Plan: * Acute cystitis without hematuria Patient experiencing lower abdominal discomfort and dysuria for at least 24 hours prior to presentation Urinalysis suggestive of urinary tract infection Patient has been initiated on intravenous ceftriaxone by the emergency department staff which will be continued at this time Urine cultures obtained, will de-escalate antibiotic therapy based on these results   COPD with acute exacerbation [facility]) Patient is been experiencing 2 to 3-day history of increasing cough productive of yellow sputum and dyspnea on exertion Physical examination consistent with mild COPD exacerbation with likely superimposed acute bacterial bronchitis Chest x-ray reveals no evidence of pneumonia Patient has been placed on scheduled bronchodilator therapy Patient is additionally being treated with azithromycin for acute bacterial bronchitis This exacerbation is rather mild and chemically simply be treated with scheduled bronchodilator therapy and treatment of the associated bacterial bronchitis.   Treating with short course of 40 mg of prednisone daily.  Acute bacterial bronchitis Please see assessment and plan above  Acute diarrhea Patient reports a 2-week history of diarrhea That being said, patient also reports having been constipated just proceeding this course of diarrhea and reports taking numerous laxatives over the course of several days.  This is likely the culprit of the patient's diarrhea Patient reports that her diarrheal symptoms seem to have been resolving rather quickly in the past several days Infectious work-up performed in the outpatient setting the other day was negative Supportive  care   Hypothyroidism Resume home regimen of Synthroid    GERD without esophagitis Continuing home regimen of daily PPI therapy.   Mixed  hyperlipidemia Continuing home regimen of lipid lowering therapy.   Nicotine dependence, cigarettes, uncomplicated Patient admits to ongoing smoking Counseling on cessation Patient agrees to nicotine replacement therapy with as needed gum  Peripheral artery disease ([place]) Continue outpatient regimen of anticoagulation       Code Status:  Full code  code status decision has been confirmed with: patient Family Communication: deferred   Consults: none  Severity of Illness:  The appropriate patient status for this patient is INPATIENT. Inpatient status is judged to be reasonable and necessary in order to provide the required intensity of service to ensure the patient's safety. The patient's presenting symptoms, physical exam findings, and initial radiographic and laboratory data in the context of their chronic comorbidities is felt to place them at high risk for further clinical deterioration. Furthermore, it is not anticipated that the patient will be medically stable for discharge from the hospital within 2 midnights of admission.   * I certify that at the point of admission it is my clinical judgment that the patient will require inpatient hospital care spanning beyond 2 midnights from the point of admission due to high intensity of service, high risk for further deterioration and high frequency of surveillance required.*  Author:  Vernelle Emerald MD  11/02/2021 6:31 AM

## 2021-11-02 NOTE — Assessment & Plan Note (Signed)
-   Continue home Crestor °

## 2021-11-02 NOTE — Assessment & Plan Note (Signed)
   Patient reports a 2-week history of diarrhea  That being said, patient also reports having been constipated just proceeding this course of diarrhea and reports taking numerous laxatives over the course of several days.  This is likely the culprit of the patient's diarrhea  Patient reports that her diarrheal symptoms seem to have been resolving rather quickly in the past several days  Infectious work-up performed in the outpatient setting the other day was negative  Supportive care

## 2021-11-02 NOTE — Assessment & Plan Note (Addendum)
   Patient is been experiencing 2 to 3-day history of increasing cough productive of yellow sputum and dyspnea on exertion  Physical examination consistent with mild COPD exacerbation with likely superimposed acute bacterial bronchitis  Chest x-ray reveals no evidence of pneumonia  Patient has been placed on scheduled bronchodilator therapy  Patient is additionally being treated with azithromycin for acute bacterial bronchitis  This exacerbation is rather mild and chemically simply be treated with scheduled bronchodilator therapy and treatment of the associated bacterial bronchitis.    Treating with short course of 40 mg of prednisone daily.

## 2021-11-02 NOTE — Assessment & Plan Note (Signed)
Patient not on outpatient treatment. Noted.

## 2021-11-02 NOTE — Progress Notes (Signed)
Mobility Specialist - Progress Note   11/02/21 1026  Mobility  Activity Ambulated with assistance in hallway  Level of Assistance Contact guard assist, steadying assist  Assistive Device None  Distance Ambulated (ft) 400 ft  Activity Response Tolerated well  $Mobility charge 1 Mobility    Pt received in bed agreeable to mobility. No complaints throughout. Left in bed w/ call bell in reach and all needs met.   Paulla Dolly Mobility Specialist

## 2021-11-02 NOTE — Assessment & Plan Note (Signed)
   Patient experiencing lower abdominal discomfort and dysuria for at least 24 hours prior to presentation  Urinalysis suggestive of urinary tract infection  Patient has been initiated on intravenous ceftriaxone by the emergency department staff which will be continued at this time  Urine cultures obtained, will de-escalate antibiotic therapy based on these results

## 2021-11-03 ENCOUNTER — Other Ambulatory Visit: Payer: Medicare Other

## 2021-11-03 DIAGNOSIS — R197 Diarrhea, unspecified: Secondary | ICD-10-CM | POA: Diagnosis not present

## 2021-11-03 DIAGNOSIS — J441 Chronic obstructive pulmonary disease with (acute) exacerbation: Secondary | ICD-10-CM | POA: Diagnosis not present

## 2021-11-03 DIAGNOSIS — E039 Hypothyroidism, unspecified: Secondary | ICD-10-CM

## 2021-11-03 DIAGNOSIS — N3000 Acute cystitis without hematuria: Secondary | ICD-10-CM | POA: Diagnosis not present

## 2021-11-03 DIAGNOSIS — J208 Acute bronchitis due to other specified organisms: Secondary | ICD-10-CM | POA: Diagnosis not present

## 2021-11-03 LAB — COMPREHENSIVE METABOLIC PANEL
ALT: 45 U/L — ABNORMAL HIGH (ref 0–44)
AST: 63 U/L — ABNORMAL HIGH (ref 15–41)
Albumin: 2.5 g/dL — ABNORMAL LOW (ref 3.5–5.0)
Alkaline Phosphatase: 75 U/L (ref 38–126)
Anion gap: 8 (ref 5–15)
BUN: 16 mg/dL (ref 8–23)
CO2: 23 mmol/L (ref 22–32)
Calcium: 8.8 mg/dL — ABNORMAL LOW (ref 8.9–10.3)
Chloride: 107 mmol/L (ref 98–111)
Creatinine, Ser: 0.77 mg/dL (ref 0.44–1.00)
GFR, Estimated: >60 mL/min (ref >60)
Glucose, Bld: 172 mg/dL — ABNORMAL HIGH (ref 70–99)
Potassium: 4.6 mmol/L (ref 3.5–5.1)
Sodium: 138 mmol/L (ref 135–145)
Total Bilirubin: 0.3 mg/dL (ref 0.3–1.2)
Total Protein: 5.7 g/dL — ABNORMAL LOW (ref 6.5–8.1)

## 2021-11-03 LAB — CBC WITH DIFFERENTIAL/PLATELET
Abs Immature Granulocytes: 0.06 10*3/uL (ref 0.00–0.07)
Basophils Absolute: 0 10*3/uL (ref 0.0–0.1)
Basophils Relative: 0 %
Eosinophils Absolute: 0 10*3/uL (ref 0.0–0.5)
Eosinophils Relative: 0 %
HCT: 33.3 % — ABNORMAL LOW (ref 36.0–46.0)
Hemoglobin: 10.5 g/dL — ABNORMAL LOW (ref 12.0–15.0)
Immature Granulocytes: 1 %
Lymphocytes Relative: 4 %
Lymphs Abs: 0.4 10*3/uL — ABNORMAL LOW (ref 0.7–4.0)
MCH: 25.9 pg — ABNORMAL LOW (ref 26.0–34.0)
MCHC: 31.5 g/dL (ref 30.0–36.0)
MCV: 82.2 fL (ref 80.0–100.0)
Monocytes Absolute: 0.5 10*3/uL (ref 0.1–1.0)
Monocytes Relative: 5 %
Neutro Abs: 10.5 10*3/uL — ABNORMAL HIGH (ref 1.7–7.7)
Neutrophils Relative %: 90 %
Platelets: 263 10*3/uL (ref 150–400)
RBC: 4.05 MIL/uL (ref 3.87–5.11)
RDW: 17.3 % — ABNORMAL HIGH (ref 11.5–15.5)
WBC: 11.5 10*3/uL — ABNORMAL HIGH (ref 4.0–10.5)
nRBC: 0 % (ref 0.0–0.2)

## 2021-11-03 LAB — PHOSPHORUS: Phosphorus: 2.2 mg/dL — ABNORMAL LOW (ref 2.5–4.6)

## 2021-11-03 LAB — IRON AND TIBC
Iron: 17 ug/dL — ABNORMAL LOW (ref 28–170)
Saturation Ratios: 6 % — ABNORMAL LOW (ref 10.4–31.8)
TIBC: 287 ug/dL (ref 250–450)
UIBC: 270 ug/dL

## 2021-11-03 LAB — FERRITIN: Ferritin: 29 ng/mL (ref 11–307)

## 2021-11-03 LAB — VITAMIN B12: Vitamin B-12: 1011 pg/mL — ABNORMAL HIGH (ref 180–914)

## 2021-11-03 LAB — FOLATE: Folate: 6.9 ng/mL (ref >5.9)

## 2021-11-03 LAB — RETICULOCYTES
Immature Retic Fract: 20.7 % — ABNORMAL HIGH (ref 2.3–15.9)
RBC.: 3.95 MIL/uL (ref 3.87–5.11)
Retic Count, Absolute: 90.1 10*3/uL (ref 19.0–186.0)
Retic Ct Pct: 2.3 % (ref 0.4–3.1)

## 2021-11-03 LAB — MAGNESIUM: Magnesium: 2.1 mg/dL (ref 1.7–2.4)

## 2021-11-03 MED ORDER — SODIUM CHLORIDE 0.9 % IV SOLN: 1.0000 g | Freq: Once | INTRAVENOUS | Status: DC

## 2021-11-03 MED ORDER — SODIUM CHLORIDE 0.9 % IV SOLN
1.0000 g | Freq: Once | INTRAVENOUS | Status: AC
  Administered 2021-11-03: 1 g via INTRAVENOUS
  Filled 2021-11-03: qty 10

## 2021-11-03 MED ORDER — BENZONATATE 100 MG PO CAPS: 100.0000 mg | ORAL_CAPSULE | Freq: Three times a day (TID) | ORAL | 0 refills | Status: DC | PRN

## 2021-11-03 MED ORDER — PREDNISONE 20 MG PO TABS: 40.0000 mg | ORAL_TABLET | Freq: Every day | ORAL | 0 refills | Status: AC

## 2021-11-03 MED ORDER — AZITHROMYCIN 500 MG PO TABS: 500.0000 mg | ORAL_TABLET | Freq: Every day | ORAL | 0 refills | Status: AC

## 2021-11-03 NOTE — Discharge Instructions (Signed)
Jill Franco,  You were in the hospital with a urinary infection and COPD exacerbation. You have been managed with antibiotics for the UTI and antibiotics/steroids for the COPD exacerbation. Please continue your treatment as prescribed. Please follow-up with your primary care physician.

## 2021-11-03 NOTE — TOC Transition Note (Signed)
Transition of Care [facility]) - CM/SW Discharge Note   Patient Details  Name: Jill Franco MRN: [id number] Date of Birth: 1934/09/28  Transition of Care [facility]) CM/SW Contact:  Cyndi Bender, RN Phone Number: 11/03/2021, 12:54 PM   Clinical Narrative:    Patient is stable for discharge. Spoke to patient on phone regarding transition needs. Patient's daughter lives with her and helps if she needs her. Daughter can't drive due to eye site but can help at home. Patient can drive herself to apts. Patient can afford her medications. Her Eliquis is expensive but her PCP helped her save money on her eliquis prescription.  Patient follows up with he PCP offend.  Patient's daughter's friend will transport home. No other TOC needs at this time.   Final next level of care: Home/Self Care Barriers to Discharge: Barriers Resolved   Patient Goals and CMS Choice Patient states their goals for this hospitalization and ongoing recovery are:: return home      Discharge Placement                 home      Discharge Plan and Services                             home        Social Determinants of Health (SDOH) Interventions     Readmission Risk Interventions    11/03/2021   12:53 PM  Readmission Risk Prevention Plan  Transportation Screening Complete  PCP or Specialist Appt within 5-7 Days Complete  Home Care Screening Complete  Medication Review (RN CM) Complete

## 2021-11-03 NOTE — Discharge Summary (Signed)
Physician Discharge Summary   Patient: Jill Franco MRN: [id number] DOB: 05/28/1934  Admit date:     11/01/2021  Discharge date: 11/03/21  Discharge Physician: Cordelia Poche, MD   PCP: Tonia Ghent, MD   Recommendations at discharge:  Hospital follow-up Blood/urine cultures Tobacco cessation  Discharge Diagnoses: Principal Problem:   Acute cystitis without hematuria Active Problems:   COPD with acute exacerbation ([place])   Acute bacterial bronchitis   Acute diarrhea   Hypothyroidism   GERD without esophagitis   Mixed hyperlipidemia   Nicotine dependence, cigarettes, uncomplicated   Peripheral artery disease ([place])  Resolved Problems:   * No resolved hospital problems. *  Hospital Course: Jill Franco is a 87 y.o. female with a history of COPD, GERD, hyperlipidemia, hypothyroidism, nicotine dependence, PVD. Patient presented secondary to diarrhea and cough. Workup was significant for evidence of UTI and COPD exacerbation. Patient started on empiric antibiotics and steroids for treatment. Patient improved significantly prior to discharge with improvement of cough/sputum and no dysuria. Patient to continue Azithromycin and prednisone on discharge. Urine culture pending on day of discharge. Blood cultures with no growth to date.  Assessment and Plan: * Acute cystitis without hematuria Symptoms of dysuria with high suggestive urinalysis. Patient was started empirically on Ceftriaxone IV for treatment with resolution of symptoms. Urine culture obtained and pending on day of discharge. Patient completed 3 days of Ceftriaxone.  COPD with acute exacerbation (HCC) Increased cough with sputum. Patient started on treatment with prednisone and Duonebs with improvement of symptoms. Discharge with continued prednisone burst. Discharge with Tessalon Perles for cough. Continue outpatient COPD regimen.  Acute bacterial bronchitis See problem, COPD with acute exacerbation ([place]).  Acute  diarrhea Resolved prior to admission.  Hypothyroidism Continue home Synthroid.   GERD without esophagitis Patient not on outpatient treatment. Noted.  Mixed hyperlipidemia Continue home Crestor.  Nicotine dependence, cigarettes, uncomplicated Continued smoking per patient. Counseling discussed on admission.  Peripheral artery disease (HCC) Continue Xarelto.   Consultants: None Procedures performed: None  Disposition: Home Diet recommendation: Regular diet/heart healthy  DISCHARGE MEDICATION: Allergies as of 11/03/2021       Reactions   Advil [ibuprofen] Nausea And Vomiting   Aspirin Other (See Comments)   Uncoated aspirin causes stomach upset on empty stomach   Codeine Nausea Only, Other (See Comments)   Pill needs to be E.coated   Lipitor [atorvastatin] Other (See Comments)   myalgia   Other Itching, Rash   Nicotine patches         Medication List     TAKE these medications    acetaminophen 650 MG CR tablet Commonly known as: TYLENOL Take 650 mg by mouth every 8 (eight) hours as needed for pain.   albuterol 108 (90 Base) MCG/ACT inhaler Commonly known as: VENTOLIN HFA USE 2 INHALATIONS BY MOUTH  INTO THE LUNGS EVERY 6  HOURS AS NEEDED FOR  WHEEZING OR SHORTNESS OF  BREATH What changed: See the new instructions.   alendronate 70 MG tablet Commonly known as: FOSAMAX Take 1 tablet (70 mg total) by mouth every 7 (seven) days. Take with a full glass of water on an empty stomach.   aspirin EC 81 MG tablet Take 1 tablet (81 mg total) by mouth daily. Swallow whole.   azithromycin 500 MG tablet Commonly known as: ZITHROMAX Take 1 tablet (500 mg total) by mouth daily for 3 days. Start taking on: November 04, 2021   benzonatate 100 MG capsule Commonly known as: Best boy Take  1 capsule (100 mg total) by mouth 3 (three) times daily as needed for up to 10 days for cough.   budesonide-formoterol 160-4.5 MCG/ACT inhaler Commonly known as:  Symbicort Inhale 2 puffs into the lungs in the morning and at bedtime.   diclofenac Sodium 1 % Gel Commonly known as: Voltaren Apply 2 g topically 4 (four) times daily.   furosemide 20 MG tablet Commonly known as: LASIX TAKE 2 TABLETS BY MOUTH IN THE  MORNING What changed: See the new instructions.   levothyroxine 50 MCG tablet Commonly known as: SYNTHROID TAKE 1 TABLET BY MOUTH  DAILY EXCEPT 2 TABLETS ON  SUNDAYS AND WEDNESDAYS (  TOTAL 9 TABS PER WEEK) What changed: See the new instructions.   melatonin 5 MG Tabs Take 5 mg by mouth at bedtime as needed (For sleep).   metoprolol tartrate 100 MG tablet Commonly known as: LOPRESSOR TAKE ONE-HALF TABLET BY  MOUTH IN THE MORNING AND 1  TABLET BY MOUTH IN THE  EVENING IF BP IS ABOVE 140/90. What changed:  how much to take how to take this when to take this additional instructions   potassium chloride SA 20 MEQ tablet Commonly known as: KLOR-CON M Take 1 tablet (20 mEq total) by mouth 2 (two) times daily.   predniSONE 20 MG tablet Commonly known as: DELTASONE Take 2 tablets (40 mg total) by mouth daily with breakfast for 3 days. Start taking on: November 04, 2021   rosuvastatin 20 MG tablet Commonly known as: CRESTOR Take 1 tablet (20 mg total) by mouth daily.   tiZANidine 4 MG tablet Commonly known as: Zanaflex Take 0.5-1 tablets (2-4 mg total) by mouth 2 (two) times daily as needed for muscle spasms (sedation caution).   Vitamin D3 25 MCG (1000 UT) Caps Take 1 capsule (1,000 Units total) by mouth daily.   Xarelto 2.5 MG Tabs tablet Generic drug: rivaroxaban Take 1 tablet (2.5 mg total) by mouth 2 (two) times daily.        Follow-up Information     Tonia Ghent, MD. Schedule an appointment as soon as possible for a visit in 1 week(s).   Specialty: Family Medicine Why: For hospital follow-up Contact information: [place] [address] [phone]                Discharge  Exam: BP 120/60 (BP Location: Right Arm)   Pulse 89   Temp 97.7 F (36.5 C) (Oral)   Resp 18   Ht '5\' 4"'$  (1.626 m)   Wt 48.5 kg   SpO2 92%   BMI 18.37 kg/m   General exam: Appears calm and comfortable Respiratory system: Mild end expiratory wheeze bilaterally. Respiratory effort normal. Cardiovascular system: S1 & S2 heard, RRR. Gastrointestinal system: Abdomen is nondistended, soft and nontender. No organomegaly or masses felt. Normal bowel sounds heard. Central nervous system: Alert and oriented. No focal neurological deficits. Musculoskeletal: No edema. No calf tenderness Skin: No cyanosis. No rashes Psychiatry: Judgement and insight appear normal. Mood & affect appropriate.   Condition at discharge: stable  The results of significant diagnostics from this hospitalization (including imaging, microbiology, ancillary and laboratory) are listed below for reference.   Imaging Studies: DG Chest 2 View  Result Date: 11/01/2021 CLINICAL DATA:  Suspected sepsis. EXAM: CHEST - 2 VIEW COMPARISON:  Chest x-ray 06/09/2021 FINDINGS: There are trace bilateral pleural effusions. The lungs are hyperinflated, unchanged. There is no focal lung infiltrate. There is scarring in the right lung apex  similar to prior study. Cardiomediastinal silhouette is within normal limits. No acute fractures. IMPRESSION: 1. Trace bilateral pleural effusions. 2. Emphysema. Electronically Signed   By: Ronney Asters M.D.   On: 11/01/2021 19:55   DG Abd 1 View  Result Date: 10/28/2021 CLINICAL DATA:  Diarrhea. EXAM: ABDOMEN - 1 VIEW COMPARISON:  September 22, 2021 [facility] FINDINGS: Air-filled large and small bowel identified throughout the abdomen, similar in the interval. Some of the loops are mildly prominent suggesting the possibility of ileus. No evidence of obstruction. No free air, portal venous gas, or pneumatosis. Lung bases are unremarkable. Atherosclerotic changes are identified in the abdominal aorta and splenic  vessels. Degenerative changes are identified in the hips with loss of joint space on the right. No other abnormalities. IMPRESSION: Air-filled prominent loops of large and small bowel suggest mild ileus. No other acute abnormalities. Electronically Signed   By: Dorise Bullion III M.D.   On: 10/28/2021 17:05    Microbiology: Results for orders placed or performed during the hospital encounter of 11/01/21  Resp Panel by RT-PCR (Flu A&B, Covid) Anterior Nasal Swab     Status: None   Collection Time: 11/01/21  7:09 PM   Specimen: Anterior Nasal Swab  Result Value Ref Range Status   SARS Coronavirus 2 by RT PCR NEGATIVE NEGATIVE Final    Comment: (NOTE) SARS-CoV-2 target nucleic acids are NOT DETECTED.  The SARS-CoV-2 RNA is generally detectable in upper respiratory specimens during the acute phase of infection. The lowest concentration of SARS-CoV-2 viral copies this assay can detect is 138 copies/mL. A negative result does not preclude SARS-Cov-2 infection and should not be used as the sole basis for treatment or other patient management decisions. A negative result may occur with  improper specimen collection/handling, submission of specimen other than nasopharyngeal swab, presence of viral mutation(s) within the areas targeted by this assay, and inadequate number of viral copies(<138 copies/mL). A negative result must be combined with clinical observations, patient history, and epidemiological information. The expected result is Negative.  Fact Sheet for Patients:  EntrepreneurPulse.com.au  Fact Sheet for Healthcare Providers:  IncredibleEmployment.be  This test is no t yet approved or cleared by the [place] FDA and  has been authorized for detection and/or diagnosis of SARS-CoV-2 by FDA under an Emergency Use Authorization (EUA). This EUA will remain  in effect (meaning this test can be used) for the duration of the COVID-19 declaration  under Section 564(b)(1) of the Act, 21 U.S.C.section 360bbb-3(b)(1), unless the authorization is terminated  or revoked sooner.       Influenza A by PCR NEGATIVE NEGATIVE Final   Influenza B by PCR NEGATIVE NEGATIVE Final    Comment: (NOTE) The Xpert Xpress SARS-CoV-2/FLU/RSV plus assay is intended as an aid in the diagnosis of influenza from Nasopharyngeal swab specimens and should not be used as a sole basis for treatment. Nasal washings and aspirates are unacceptable for Xpert Xpress SARS-CoV-2/FLU/RSV testing.  Fact Sheet for Patients: EntrepreneurPulse.com.au  Fact Sheet for Healthcare Providers: IncredibleEmployment.be  This test is not yet approved or cleared by the [place] FDA and has been authorized for detection and/or diagnosis of SARS-CoV-2 by FDA under an Emergency Use Authorization (EUA). This EUA will remain in effect (meaning this test can be used) for the duration of the COVID-19 declaration under Section 564(b)(1) of the Act, 21 U.S.C. section 360bbb-3(b)(1), unless the authorization is terminated or revoked.  Performed at [facility], [address] [place],  [address]  Culture, blood (Routine x 2)     Status: None (Preliminary result)   Collection Time: 11/01/21  7:25 PM   Specimen: BLOOD  Result Value Ref Range Status   Specimen Description   Final    BLOOD LEFT ANTECUBITAL Performed at [facility], [address] [place], [place] [address]    Special Requests   Final    Blood Culture adequate volume BOTTLES DRAWN AEROBIC AND ANAEROBIC Performed at [facility], [address] [place], [place] [address]    Culture   Final    NO GROWTH 2 DAYS Performed at [facility], [place] [address] [place], [place] [address]    Report Status PENDING  Incomplete  Culture, blood (Routine x 2)     Status: None (Preliminary result)   Collection  Time: 11/01/21  7:28 PM   Specimen: BLOOD  Result Value Ref Range Status   Specimen Description   Final    BLOOD RIGHT ANTECUBITAL Performed at [facility], [address], [place], [place] [address]    Special Requests   Final    Blood Culture adequate volume BOTTLES DRAWN AEROBIC AND ANAEROBIC Performed at [facility], [address] [place], [place] [address]    Culture   Final    NO GROWTH 2 DAYS Performed at [facility], [place] [address], [place], [place] [address]    Report Status PENDING  Incomplete    Labs: CBC: Recent Labs  Lab 10/28/21 1051 11/01/21 1929 11/02/21 0854 11/03/21 0119  WBC 7.9 11.8* 10.0 11.5*  NEUTROABS 6.1 10.0* 9.3* 10.5*  HGB 12.3 11.7* 10.9* 10.5*  HCT 39.3 37.7 34.0* 33.3*  MCV 81.2 82.7 81.3 82.2  PLT 307.0 310 262 676   Basic Metabolic Panel: Recent Labs  Lab 10/28/21 1051 11/01/21 1929 11/02/21 0854 11/03/21 0119  NA 136 133* 138 138  K 2.5* 4.4 4.3 4.6  CL 94* 101 108 107  CO2 30 21* 21* 23  GLUCOSE 96 128* 142* 172*  BUN '15 12 8 16  '$ CREATININE 0.79 0.80 0.60 0.77  CALCIUM 9.3 8.9 8.2* 8.8*  MG  --   --  2.0 2.1  PHOS  --   --   --  2.2*   Liver Function Tests: Recent Labs  Lab 10/28/21 1051 11/01/21 1929 11/02/21 0854 11/03/21 0119  AST 22 42* 33 63*  ALT '13 20 22 '$ 45*  ALKPHOS 68 101 78 75  BILITOT 0.5 0.5 0.5 0.3  PROT 7.2 6.8 5.8* 5.7*  ALBUMIN 3.8 3.7 2.4* 2.5*    Discharge time spent: 35 minutes.  Signed: Cordelia Poche, MD [facility] 11/03/2021

## 2021-11-03 NOTE — Progress Notes (Signed)
Mobility Specialist - Progress Note   11/03/21 0923  Mobility  Activity Ambulated with assistance in hallway  Level of Assistance Contact guard assist, steadying assist  Assistive Device None  Distance Ambulated (ft) 300 ft  Activity Response Tolerated well  $Mobility charge 1 Mobility    Pt received in bed and agreeable to mobility. Left in bed w/ call bell in reach and all needs met.   Paulla Dolly Mobility Specialist

## 2021-11-03 NOTE — Plan of Care (Signed)
Pt and family understanding of plan of care

## 2021-11-03 NOTE — Progress Notes (Signed)
OT Cancellation Note  Patient Details Name: Jill Franco MRN: [id number] DOB: [birth year]   Cancelled Treatment:    Reason Eval/Treat Not Completed: OT screened, no needs identified, will sign off.  Julien Girt 11/03/2021, 1:04 PM

## 2021-11-03 NOTE — Plan of Care (Signed)

## 2021-11-04 ENCOUNTER — Telehealth: Payer: Self-pay | Admitting: *Deleted

## 2021-11-04 ENCOUNTER — Encounter: Payer: Self-pay | Admitting: *Deleted

## 2021-11-04 LAB — URINE CULTURE: Culture: >=100000 — AB

## 2021-11-04 LAB — PANCREATIC ELASTASE, FECAL: Pancreatic Elastase-1, Stool: 179 mcg/g — ABNORMAL LOW

## 2021-11-04 NOTE — Patient Outreach (Signed)
  Care Coordination [facility] Note Transition Care Management Follow-up Telephone Call Date of discharge and from where: 11/03/21 from [facility] How have you been since you were released from the hospital? "I'm feeling much better. I've been walking this morning" Any questions or concerns? Yes. Discussed bout of diarrhea prior to hospitalization. It resolved when admitted. Has not had a bowel movement since 11/01/21. No abdominal discomfort. Discussed constipation management. Walk often, drink plenty of water, eat fruits/vegetables, take a stool softener if needed.   Items Reviewed: Did the pt receive and understand the discharge instructions provided? Yes  Medications obtained and verified? Yes  Other? No  Any new allergies since your discharge? No  Dietary orders reviewed? Yes Do you have support at home? Yes   Home Care and Equipment/Supplies: Were home health services ordered? no If so, what is the name of the agency? N/A  Has the agency set up a time to come to the patient's home? no Were any new equipment or medical supplies ordered?  No What is the name of the medical supply agency? N/A Were you able to get the supplies/equipment? not applicable Do you have any questions related to the use of the equipment or supplies? No  Functional Questionnaire: (I = Independent and D = Dependent) ADLs: D  Bathing/Dressing- D  Meal Prep- D  Eating- D  Maintaining continence- D  Transferring/Ambulation- D  Managing Meds- D  Follow up appointments reviewed:  PCP Hospital f/u appt confirmed? Yes  Scheduled to see Keanu Stain, MD on 11/08/21 @ 11:30. [place] [facility] f/u appt confirmed? No   Are transportation arrangements needed? No  If their condition worsens, is the pt aware to call PCP or go to the Emergency Dept.? Yes Was the patient provided with contact information for the PCP's office or ED? Yes Was to pt encouraged to call back with questions or concerns? Yes  SDOH assessments and  interventions completed:   Yes  Care Coordination Interventions Activated:  No   Care Coordination Interventions:   n/a     Encounter Outcome:  Pt. Visit Completed    Chong Sicilian, BSN, RN-BC RN Care Coordinator [place] Direct Dial: [phone] Main #: [phone]

## 2021-11-06 LAB — CULTURE, BLOOD (ROUTINE X 2)
Culture: NO GROWTH
Culture: NO GROWTH
Special Requests: ADEQUATE
Special Requests: ADEQUATE

## 2021-11-08 ENCOUNTER — Ambulatory Visit: Payer: Medicare Other | Admitting: Family Medicine

## 2021-11-09 ENCOUNTER — Other Ambulatory Visit: Payer: Self-pay | Admitting: Family Medicine

## 2021-11-09 DIAGNOSIS — K8689 Other specified diseases of pancreas: Secondary | ICD-10-CM | POA: Insufficient documentation

## 2021-11-09 DIAGNOSIS — K8681 Exocrine pancreatic insufficiency: Secondary | ICD-10-CM | POA: Insufficient documentation

## 2021-11-09 MED ORDER — PANCRELIPASE (LIP-PROT-AMYL) 36000-114000 UNITS PO CPEP: ORAL_CAPSULE | ORAL | 11 refills | Status: DC

## 2021-11-11 ENCOUNTER — Encounter: Payer: Self-pay | Admitting: Family Medicine

## 2021-11-11 ENCOUNTER — Ambulatory Visit (INDEPENDENT_AMBULATORY_CARE_PROVIDER_SITE_OTHER): Payer: Medicare Other | Admitting: Family Medicine

## 2021-11-11 ENCOUNTER — Telehealth: Payer: Self-pay | Admitting: Family Medicine

## 2021-11-11 VITALS — BP 102/62 | HR 102 | Temp 97.8°F | Ht 64.0 in | Wt 109.0 lb

## 2021-11-11 DIAGNOSIS — K8681 Exocrine pancreatic insufficiency: Secondary | ICD-10-CM | POA: Diagnosis not present

## 2021-11-11 DIAGNOSIS — N3000 Acute cystitis without hematuria: Secondary | ICD-10-CM | POA: Diagnosis not present

## 2021-11-11 DIAGNOSIS — I10 Essential (primary) hypertension: Secondary | ICD-10-CM

## 2021-11-11 DIAGNOSIS — J441 Chronic obstructive pulmonary disease with (acute) exacerbation: Secondary | ICD-10-CM

## 2021-11-11 LAB — CBC WITH DIFFERENTIAL/PLATELET
Basophils Absolute: 0 10*3/uL (ref 0.0–0.1)
Basophils Relative: 0.2 % (ref 0.0–3.0)
Eosinophils Absolute: 0 10*3/uL (ref 0.0–0.7)
Eosinophils Relative: 0.2 % (ref 0.0–5.0)
HCT: 33.6 % — ABNORMAL LOW (ref 36.0–46.0)
Hemoglobin: 10.8 g/dL — ABNORMAL LOW (ref 12.0–15.0)
Lymphocytes Relative: 5.1 % — ABNORMAL LOW (ref 12.0–46.0)
Lymphs Abs: 0.8 10*3/uL (ref 0.7–4.0)
MCHC: 32 g/dL (ref 30.0–36.0)
MCV: 81 fl (ref 78.0–100.0)
Monocytes Absolute: 1.7 10*3/uL — ABNORMAL HIGH (ref 0.1–1.0)
Monocytes Relative: 11.5 % (ref 3.0–12.0)
Neutro Abs: 12.1 10*3/uL — ABNORMAL HIGH (ref 1.4–7.7)
Neutrophils Relative %: 83 % — ABNORMAL HIGH (ref 43.0–77.0)
Platelets: 322 10*3/uL (ref 150.0–400.0)
RBC: 4.15 Mil/uL (ref 3.87–5.11)
RDW: 18.8 % — ABNORMAL HIGH (ref 11.5–15.5)
WBC: 14.6 10*3/uL — ABNORMAL HIGH (ref 4.0–10.5)

## 2021-11-11 LAB — COMPREHENSIVE METABOLIC PANEL
ALT: 78 U/L — ABNORMAL HIGH (ref 0–35)
AST: 42 U/L — ABNORMAL HIGH (ref 0–37)
Albumin: 3.2 g/dL — ABNORMAL LOW (ref 3.5–5.2)
Alkaline Phosphatase: 112 U/L (ref 39–117)
BUN: 15 mg/dL (ref 6–23)
CO2: 26 mEq/L (ref 19–32)
Calcium: 8.7 mg/dL (ref 8.4–10.5)
Chloride: 100 mEq/L (ref 96–112)
Creatinine, Ser: 0.72 mg/dL (ref 0.40–1.20)
GFR: 76.21 mL/min (ref >60.00)
Glucose, Bld: 103 mg/dL — ABNORMAL HIGH (ref 70–99)
Potassium: 3.8 mEq/L (ref 3.5–5.1)
Sodium: 133 mEq/L — ABNORMAL LOW (ref 135–145)
Total Bilirubin: 0.7 mg/dL (ref 0.2–1.2)
Total Protein: 6.6 g/dL (ref 6.0–8.3)

## 2021-11-11 MED ORDER — BENZONATATE 200 MG PO CAPS: 200.0000 mg | ORAL_CAPSULE | Freq: Three times a day (TID) | ORAL | Status: DC | PRN

## 2021-11-11 MED ORDER — PANCRELIPASE (LIP-PROT-AMYL) 36000-114000 UNITS PO CPEP: ORAL_CAPSULE | ORAL | 11 refills | Status: DC

## 2021-11-11 NOTE — Telephone Encounter (Signed)
  Encourage patient to contact the pharmacy for refills or they can request refills through [facility]  Did the patient contact the pharmacy: No  LAST APPOINTMENT DATE: 11/11/2021  NEXT APPOINTMENT DATE: N/A  MEDICATION: lipase/protease/amylase (CREON) 36000 UNITS CPEP capsule  Is the patient out of medication? Yes  PHARMACY: [facility] ([facility] [place] [facility] [place]  Let patient know to contact pharmacy at the end of the day to make sure medication is ready.  Please notify patient to allow 48-72 hours to process

## 2021-11-11 NOTE — Progress Notes (Signed)
Recommendations at discharge:  Hospital follow-up Blood/urine cultures Tobacco cessation   Discharge Diagnoses: Principal Problem:   Acute cystitis without hematuria Active Problems:   COPD with acute exacerbation (HCC)   Acute bacterial bronchitis   Acute diarrhea   Hypothyroidism   GERD without esophagitis   Mixed hyperlipidemia   Nicotine dependence, cigarettes, uncomplicated   Peripheral artery disease ([place])   Resolved Problems:   * No resolved hospital problems. *   Hospital Course: Jill Franco is a 87 y.o. female with a history of COPD, GERD, hyperlipidemia, hypothyroidism, nicotine dependence, PVD. Patient presented secondary to diarrhea and cough. Workup was significant for evidence of UTI and COPD exacerbation. Patient started on empiric antibiotics and steroids for treatment. Patient improved significantly prior to discharge with improvement of cough/sputum and no dysuria. Patient to continue Azithromycin and prednisone on discharge. Urine culture pending on day of discharge. Blood cultures with no growth to date.   Assessment and Plan: * Acute cystitis without hematuria Symptoms of dysuria with high suggestive urinalysis. Patient was started empirically on Ceftriaxone IV for treatment with resolution of symptoms. Urine culture obtained and pending on day of discharge. Patient completed 3 days of Ceftriaxone.   COPD with acute exacerbation (HCC) Increased cough with sputum. Patient started on treatment with prednisone and Duonebs with improvement of symptoms. Discharge with continued prednisone burst. Discharge with Tessalon Perles for cough. Continue outpatient COPD regimen.   Acute bacterial bronchitis See problem, COPD with acute exacerbation ([place]).   Acute diarrhea Resolved prior to admission.   Hypothyroidism Continue home Synthroid.   GERD without esophagitis Patient not on outpatient treatment. Noted.   Mixed hyperlipidemia Continue home Crestor.    Nicotine dependence, cigarettes, uncomplicated Continued smoking per patient. Counseling discussed on admission.   Peripheral artery disease (HCC) Continue Xarelto.  ===================================== Inpatient course discussed with patient.  See above.  Treatment for cystitis and COPD exacerbation.  Dysuria has resolved in the meantime.  Cough is better.  Not short of breath.  Culture data discussed with patient.  Done with antibiotics at this point.  No fevers.  She had noted a R abd bruise.  Variable pain, with turning over in the bed.  This is slowly getting better. She was getting lightheaded.  More yesterday than today.  Better today.  No CP.   Prev burning with urination resolved.  No dysuria now.  Breathing is improved in the meantime.  She isn't smoking now.  Minimal yellowish sputum and that is better than prior.  Tessalon helps with cough.   She isn't on creon at this point.  Discussed with patient about rationale for use given previous pancreatic studies.  She isn't taking metoprolol since BP hasn't been up.  She hasn't taken lasix today.    Meds, vitals, and allergies reviewed.   ROS: Per HPI unless specifically indicated in ROS section   GEN: nad, alert and oriented HEENT:  NECK: supple w/o LA CV: rrr. PULM: ctab, no inc wob ABD: soft, +bs EXT: Trace BLE edema.  SKIN: no acute rash

## 2021-11-11 NOTE — Telephone Encounter (Signed)
Erx sent

## 2021-11-11 NOTE — Patient Instructions (Addendum)
Go to the lab on the way out.   If you have mychart we'll likely use that to update you.    Take care.  Glad to see you.  Check to see about taking creon in the meantime with snacks and meals.  Let me know if you can't get it filled.   I would skip lasix and metoprolol for now.  I would only take lasix if the swelling is a lot worse.  If you take lasix, then take potassium with it.

## 2021-11-11 NOTE — Telephone Encounter (Signed)
Patient called back in stating that this rx is 700 dollars even with her insurance. She would like to know is there anything else that can be called in for her that is cheaper or her insurance covers?

## 2021-11-12 ENCOUNTER — Telehealth: Payer: Self-pay

## 2021-11-12 ENCOUNTER — Telehealth: Payer: Medicare Other

## 2021-11-12 ENCOUNTER — Telehealth: Payer: Self-pay | Admitting: Family Medicine

## 2021-11-12 NOTE — Telephone Encounter (Signed)
Routed to pharmacy for input here.  Patient with pancreatic insufficiency.  Is there a cheaper way to get creon?  Thanks.

## 2021-11-12 NOTE — Telephone Encounter (Signed)
Patient daughter called and stated that the medication lipase/protease/amylase (CREON) 36000 UNITS CPEP capsule is $700 it is too expensive and his PCP told them to call back if it was too expensive. Call back number [phone].

## 2021-11-12 NOTE — Chronic Care Management (AMB) (Signed)
Forms obtained for assistance from manufacturer and filled out for Creon. Sent for review .  Charlene Brooke, CPP notified  Avel Sensor, [place]  [phone]

## 2021-11-12 NOTE — Telephone Encounter (Signed)
Printed forms and placed in front office for patient signature. 

## 2021-11-12 NOTE — Telephone Encounter (Signed)
This is being worked on in another TE.

## 2021-11-12 NOTE — Telephone Encounter (Signed)
Spoke with the patient. She will come to office Monday 9/25 with monthly income and to sign form for medication Creon from manufacturer   Charlene Brooke, CPP notified  Avel Sensor, [place]  [phone]

## 2021-11-12 NOTE — Telephone Encounter (Signed)
Filled out and sent for review   Charlene Brooke, CPP notified  Avel Sensor, [place]  [phone]

## 2021-11-15 ENCOUNTER — Other Ambulatory Visit: Payer: Self-pay | Admitting: Family Medicine

## 2021-11-15 DIAGNOSIS — K8681 Exocrine pancreatic insufficiency: Secondary | ICD-10-CM

## 2021-11-15 NOTE — Assessment & Plan Note (Signed)
Recently noted but clearly better in the meantime.  She can use Tessalon as needed for cough.

## 2021-11-15 NOTE — Assessment & Plan Note (Signed)
I asked her to check to see about taking creon in the meantime with snacks and meals.  I asked her to let me know if she can't get it filled.

## 2021-11-15 NOTE — Assessment & Plan Note (Signed)
See notes on follow-up labs.  Clearly better in the meantime.  Okay for outpatient follow-up.

## 2021-11-15 NOTE — Assessment & Plan Note (Signed)
Discussed options. I would skip lasix and metoprolol for now.  I would only take lasix if the swelling is a lot worse.  If taking lasix, then take potassium with it.

## 2021-11-16 ENCOUNTER — Other Ambulatory Visit: Payer: Self-pay | Admitting: Family Medicine

## 2021-11-18 ENCOUNTER — Other Ambulatory Visit: Payer: Self-pay | Admitting: Family Medicine

## 2021-11-23 NOTE — Progress Notes (Signed)
Spoke with [employer] in regards to patients Creon patient assistance application. It is currently in processing. It will take 3-5 business days (from today) for a decision to be made.    Charlene Brooke, CPP notified  Marijean Niemann, [place] Clinical Pharmacy Assistant [phone]

## 2021-11-29 ENCOUNTER — Other Ambulatory Visit (INDEPENDENT_AMBULATORY_CARE_PROVIDER_SITE_OTHER): Payer: Medicare Other

## 2021-11-29 DIAGNOSIS — K8681 Exocrine pancreatic insufficiency: Secondary | ICD-10-CM | POA: Diagnosis not present

## 2021-11-29 LAB — COMPREHENSIVE METABOLIC PANEL
ALT: 17 U/L (ref 0–35)
AST: 22 U/L (ref 0–37)
Albumin: 4 g/dL (ref 3.5–5.2)
Alkaline Phosphatase: 83 U/L (ref 39–117)
BUN: 17 mg/dL (ref 6–23)
CO2: 27 mEq/L (ref 19–32)
Calcium: 9.9 mg/dL (ref 8.4–10.5)
Chloride: 102 mEq/L (ref 96–112)
Creatinine, Ser: 0.83 mg/dL (ref 0.40–1.20)
GFR: 64.24 mL/min (ref >60.00)
Glucose, Bld: 100 mg/dL — ABNORMAL HIGH (ref 70–99)
Potassium: 4 mEq/L (ref 3.5–5.1)
Sodium: 138 mEq/L (ref 135–145)
Total Bilirubin: 0.4 mg/dL (ref 0.2–1.2)
Total Protein: 7.3 g/dL (ref 6.0–8.3)

## 2021-11-29 LAB — CBC WITH DIFFERENTIAL/PLATELET
Basophils Absolute: 0.1 10*3/uL (ref 0.0–0.1)
Basophils Relative: 1.7 % (ref 0.0–3.0)
Eosinophils Absolute: 0.2 10*3/uL (ref 0.0–0.7)
Eosinophils Relative: 3.9 % (ref 0.0–5.0)
HCT: 36.7 % (ref 36.0–46.0)
Hemoglobin: 11.6 g/dL — ABNORMAL LOW (ref 12.0–15.0)
Lymphocytes Relative: 14.3 % (ref 12.0–46.0)
Lymphs Abs: 0.8 10*3/uL (ref 0.7–4.0)
MCHC: 31.6 g/dL (ref 30.0–36.0)
MCV: 82.2 fl (ref 78.0–100.0)
Monocytes Absolute: 0.7 10*3/uL (ref 0.1–1.0)
Monocytes Relative: 11.8 % (ref 3.0–12.0)
Neutro Abs: 3.9 10*3/uL (ref 1.4–7.7)
Neutrophils Relative %: 68.3 % (ref 43.0–77.0)
Platelets: 400 10*3/uL (ref 150.0–400.0)
RBC: 4.47 Mil/uL (ref 3.87–5.11)
RDW: 18.4 % — ABNORMAL HIGH (ref 11.5–15.5)
WBC: 5.7 10*3/uL (ref 4.0–10.5)

## 2021-11-30 ENCOUNTER — Telehealth: Payer: Self-pay

## 2021-11-30 NOTE — Patient Outreach (Signed)
  Care Coordination   11/30/2021 Name: Jill Franco MRN: [id number] DOB: 02/12/1935   Care Coordination Outreach Attempts:  An unsuccessful telephone outreach was attempted today to offer the patient information about available care coordination services as a benefit of their health plan.   Follow Up Plan:  Additional outreach attempts will be made to offer the patient care coordination information and services.   Encounter Outcome:  No Answer  Care Coordination Interventions Activated:  No   Care Coordination Interventions:  No, not indicated    Quinn Plowman RN,BSN,CCM [place] [phone] direct line

## 2021-12-01 ENCOUNTER — Ambulatory Visit (INDEPENDENT_AMBULATORY_CARE_PROVIDER_SITE_OTHER): Payer: Medicare Other

## 2021-12-01 DIAGNOSIS — Z23 Encounter for immunization: Secondary | ICD-10-CM

## 2021-12-02 ENCOUNTER — Other Ambulatory Visit: Payer: Self-pay | Admitting: Family Medicine

## 2021-12-05 ENCOUNTER — Other Ambulatory Visit: Payer: Self-pay | Admitting: Family Medicine

## 2021-12-05 DIAGNOSIS — D649 Anemia, unspecified: Secondary | ICD-10-CM

## 2022-01-17 ENCOUNTER — Other Ambulatory Visit (INDEPENDENT_AMBULATORY_CARE_PROVIDER_SITE_OTHER): Payer: Medicare Other

## 2022-01-17 ENCOUNTER — Telehealth: Payer: Self-pay | Admitting: Family Medicine

## 2022-01-17 DIAGNOSIS — E876 Hypokalemia: Secondary | ICD-10-CM | POA: Diagnosis not present

## 2022-01-17 DIAGNOSIS — I739 Peripheral vascular disease, unspecified: Secondary | ICD-10-CM

## 2022-01-17 DIAGNOSIS — D649 Anemia, unspecified: Secondary | ICD-10-CM | POA: Diagnosis not present

## 2022-01-17 LAB — CBC WITH DIFFERENTIAL/PLATELET
Basophils Absolute: 0.1 10*3/uL (ref 0.0–0.1)
Basophils Relative: 0.9 % (ref 0.0–3.0)
Eosinophils Absolute: 0.2 10*3/uL (ref 0.0–0.7)
Eosinophils Relative: 2.2 % (ref 0.0–5.0)
HCT: 37 % (ref 36.0–46.0)
Hemoglobin: 11.4 g/dL — ABNORMAL LOW (ref 12.0–15.0)
Lymphocytes Relative: 12.5 % (ref 12.0–46.0)
Lymphs Abs: 1 10*3/uL (ref 0.7–4.0)
MCHC: 30.7 g/dL (ref 30.0–36.0)
MCV: 76.5 fl — ABNORMAL LOW (ref 78.0–100.0)
Monocytes Absolute: 0.8 10*3/uL (ref 0.1–1.0)
Monocytes Relative: 11.2 % (ref 3.0–12.0)
Neutro Abs: 5.6 10*3/uL (ref 1.4–7.7)
Neutrophils Relative %: 73.2 % (ref 43.0–77.0)
Platelets: 290 10*3/uL (ref 150.0–400.0)
RBC: 4.84 Mil/uL (ref 3.87–5.11)
RDW: 17.1 % — ABNORMAL HIGH (ref 11.5–15.5)
WBC: 7.6 10*3/uL (ref 4.0–10.5)

## 2022-01-17 LAB — BASIC METABOLIC PANEL
BUN: 15 mg/dL (ref 6–23)
CO2: 26 mEq/L (ref 19–32)
Calcium: 9 mg/dL (ref 8.4–10.5)
Chloride: 100 mEq/L (ref 96–112)
Creatinine, Ser: 0.87 mg/dL (ref 0.40–1.20)
GFR: 60.65 mL/min (ref >60.00)
Glucose, Bld: 96 mg/dL (ref 70–99)
Potassium: 4.2 mEq/L (ref 3.5–5.1)
Sodium: 135 mEq/L (ref 135–145)

## 2022-01-17 MED ORDER — XARELTO 2.5 MG PO TABS: 2.5000 mg | ORAL_TABLET | Freq: Two times a day (BID) | ORAL | 1 refills | Status: DC

## 2022-01-17 NOTE — Telephone Encounter (Signed)
Erx sent

## 2022-01-17 NOTE — Telephone Encounter (Signed)
  Encourage patient to contact the pharmacy for refills or they can request refills through [facility]  Did the patient contact the pharmacy: No  LAST APPOINTMENT DATE: 11/11/2021  NEXT APPOINTMENT DATE: N/A  MEDICATION: rivaroxaban (XARELTO) 2.5 MG TABS tablet   Is the patient out of medication? No  If not, how much is left? Enough until Thursday  PHARMACY:   [facility] (Ph: [phone])    Let patient know to contact pharmacy at the end of the day to make sure medication is ready.  Please notify patient to allow 48-72 hours to process

## 2022-01-18 NOTE — Telephone Encounter (Signed)
Pt's daughter called stating the pt found 2 bottles of the meds, rivaroxaban (XARELTO) 2.5 MG TABS tablet so they no longer need the prescription refilled. Pt's daughter is asking if the prescription can cancelled?

## 2022-01-18 NOTE — Telephone Encounter (Signed)
Attempted twice to call pharmacy about rx but could not get thru to them today.

## 2022-01-20 ENCOUNTER — Other Ambulatory Visit: Payer: Self-pay | Admitting: Family Medicine

## 2022-01-20 DIAGNOSIS — D649 Anemia, unspecified: Secondary | ICD-10-CM

## 2022-01-21 ENCOUNTER — Telehealth: Payer: Self-pay | Admitting: Family Medicine

## 2022-01-21 DIAGNOSIS — M25431 Effusion, right wrist: Secondary | ICD-10-CM

## 2022-01-21 MED ORDER — DICLOFENAC SODIUM 1 % EX GEL: 2.0000 g | Freq: Four times a day (QID) | CUTANEOUS | 0 refills | Status: AC

## 2022-01-21 NOTE — Telephone Encounter (Signed)
Caller Name: Providencia  Call back phone #: [phone]  MEDICATION(S):  diclofenac Sodium (VOLTAREN) 1 % GEL   Days of Med Remaining: 0  Has the patient contacted their pharmacy (YES/NO)? NO What did pharmacy advise?   Preferred Pharmacy:  [facility] [address] ([address]), [place] - [place]   ~~~Please advise patient/caregiver to allow 2-3 business days to process RX refills.

## 2022-01-21 NOTE — Telephone Encounter (Signed)
Erx sent

## 2022-02-20 ENCOUNTER — Other Ambulatory Visit: Payer: Self-pay | Admitting: Family Medicine

## 2022-02-20 DIAGNOSIS — J441 Chronic obstructive pulmonary disease with (acute) exacerbation: Secondary | ICD-10-CM

## 2022-02-23 ENCOUNTER — Telehealth: Payer: Self-pay

## 2022-02-23 NOTE — Progress Notes (Cosign Needed)
Entered in error

## 2022-02-28 ENCOUNTER — Telehealth: Payer: Medicare Other

## 2022-03-22 ENCOUNTER — Other Ambulatory Visit (INDEPENDENT_AMBULATORY_CARE_PROVIDER_SITE_OTHER): Payer: Medicare Other

## 2022-03-22 DIAGNOSIS — D649 Anemia, unspecified: Secondary | ICD-10-CM

## 2022-03-22 LAB — CBC WITH DIFFERENTIAL/PLATELET
Basophils Absolute: 0.1 10*3/uL (ref 0.0–0.1)
Basophils Relative: 1.6 % (ref 0.0–3.0)
Eosinophils Absolute: 0.1 10*3/uL (ref 0.0–0.7)
Eosinophils Relative: 1.1 % (ref 0.0–5.0)
HCT: 37.2 % (ref 36.0–46.0)
Hemoglobin: 11.6 g/dL — ABNORMAL LOW (ref 12.0–15.0)
Lymphocytes Relative: 14.8 % (ref 12.0–46.0)
Lymphs Abs: 0.8 10*3/uL (ref 0.7–4.0)
MCHC: 31.2 g/dL (ref 30.0–36.0)
MCV: 72.9 fl — ABNORMAL LOW (ref 78.0–100.0)
Monocytes Absolute: 0.5 10*3/uL (ref 0.1–1.0)
Monocytes Relative: 9.4 % (ref 3.0–12.0)
Neutro Abs: 4 10*3/uL (ref 1.4–7.7)
Neutrophils Relative %: 73.1 % (ref 43.0–77.0)
Platelets: 234 10*3/uL (ref 150.0–400.0)
RBC: 5.1 Mil/uL (ref 3.87–5.11)
RDW: 19 % — ABNORMAL HIGH (ref 11.5–15.5)
WBC: 5.5 10*3/uL (ref 4.0–10.5)

## 2022-03-22 LAB — IRON: Iron: 24 ug/dL — ABNORMAL LOW (ref 42–145)

## 2022-03-22 LAB — FERRITIN: Ferritin: 9.1 ng/mL — ABNORMAL LOW (ref 10.0–291.0)

## 2022-03-22 LAB — VITAMIN B12: Vitamin B-12: 229 pg/mL (ref 211–911)

## 2022-03-24 ENCOUNTER — Telehealth: Payer: Self-pay

## 2022-03-24 NOTE — Progress Notes (Addendum)
Care Management & Coordination Services Pharmacy Team  Reason for Encounter: Appointment Reminder  Contacted patient to confirm telephone appointment with Charlene Brooke, PharmD on 03/31/22 at 3:45. Spoke with patient on 03/24/2022   Do you have any problems getting your medications? No  What is your top health concern you would like to discuss at your upcoming visit?  Patient states she wants to discuss medications.Patient reports she may need refills.  Have you seen any other providers since your last visit with PCP? No   Hospital visits:  11/01/21-11/03/21  [facility]- acute cyctitis-Patient started on empiric antibiotics and steroids for treatment    Star Rating Drugs:  Medication:  Last Fill: Day Supply Rosuvastatin '20mg'$  12/24/21  100   Care Gaps: Annual wellness visit in last year? Yes   Charlene Brooke, PharmD notified  Avel Sensor, [place] Assistant [phone]

## 2022-03-29 ENCOUNTER — Encounter: Payer: Medicare Other | Admitting: Pharmacist

## 2022-03-30 ENCOUNTER — Other Ambulatory Visit: Payer: Self-pay | Admitting: Family Medicine

## 2022-03-30 DIAGNOSIS — K8681 Exocrine pancreatic insufficiency: Secondary | ICD-10-CM

## 2022-03-30 DIAGNOSIS — D649 Anemia, unspecified: Secondary | ICD-10-CM

## 2022-03-30 MED ORDER — VITAMIN B-12 1000 MCG PO TABS: 1000.0000 ug | ORAL_TABLET | Freq: Every day | ORAL | Status: DC

## 2022-03-30 MED ORDER — FERROUS SULFATE 325 (65 FE) MG PO TABS: 325.0000 mg | ORAL_TABLET | Freq: Every day | ORAL | Status: DC

## 2022-03-31 ENCOUNTER — Ambulatory Visit: Payer: Medicare Other | Admitting: Pharmacist

## 2022-03-31 ENCOUNTER — Telehealth: Payer: Self-pay | Admitting: Pharmacist

## 2022-03-31 ENCOUNTER — Telehealth: Payer: Self-pay

## 2022-03-31 NOTE — Telephone Encounter (Signed)
There should be refills on file and patient should call the pharmacy to have filled. Called patient and let her know this.

## 2022-03-31 NOTE — Patient Instructions (Signed)
Visit Information  Thank you for taking time to visit with me today. Please don't hesitate to contact me if I can be of assistance to you.   Following are the goals we discussed today:   Goals Addressed             This Visit's Progress    "I'd like to focus on my COPD       Interventions Today    Flowsheet Row Most Recent Value  Chronic Disease Discussed/Reviewed   Chronic disease discussed/reviewed during today's visit Chronic Obstructive Pulmonary Disease (COPD)  General Interventions   General Interventions Discussed/Reviewed General Interventions Discussed  Education Interventions   Education Provided Provided Printed Education, Provided Verbal Education  Provided Verbal Education On When to see the doctor  [COPD action plan]  Pharmacy Interventions   Pharmacy Dicussed/Reviewed Pharmacy Topics Reviewed  [medications reviewed and compliance discussed. Patient scheduled for follow up with practice pharmacist today.]     Care Coordination Interventions: Provided patient with basic written and verbal COPD education on self care/management/and exacerbation prevention Discussed COPD triggers and ways to avoid Advised patient to self assesses COPD action plan zone and make appointment with provider if in the yellow zone for 48 hours without improvement Provided education about and advised patient to utilize infection prevention strategies to reduce risk of respiratory infection Evaluation of current treatment plan related to COPD and patient's adherence to plan as established by provider Reviewed medications with patient and discussed importance of compliance Reviewed scheduled/upcoming provider appointments  Discussed plans with patient for ongoing care management follow up and provided patient with direct contact information for care management team            Our next appointment is by telephone on 05/02/22 at 11:00 am  Please call the care guide team at [phone] if  you need to cancel or reschedule your appointment.   If you are experiencing a Mental Health or [place] or need someone to talk to, please call the Suicide and Crisis Lifeline: 988 call 1-800-273-TALK (toll free, 24 hour hotline)  The patient verbalized understanding of instructions, educational materials, and care plan provided today and agreed to receive a mailed copy of patient instructions, educational materials, and care plan.   Quinn Plowman RN,BSN,CCM [facility] [phone] direct line

## 2022-03-31 NOTE — Patient Outreach (Signed)
  Care Coordination   Initial Visit Note   03/31/2022 Name: AZIAH KAISER MRN: [id number] DOB: 09-24-1934  VERONIKA HEARD is a 88 y.o. year old female who sees Tonia Ghent, MD for primary care. I spoke with  [name] by phone today.  What matters to the patients health and wellness today?  Patient states she would like to focus on her COPD. She states she uses her inhalers as prescribed.  Denies any recent COPD flare ups.  She states really hot weather and cold weather can be triggers for COPD flares. Patient states she has occasional coughs and some sputum production.     Goals Addressed             This Visit's Progress    "I'd like to focus on my COPD       Interventions Today    Flowsheet Row Most Recent Value  Chronic Disease Discussed/Reviewed   Chronic disease discussed/reviewed during today's visit Chronic Obstructive Pulmonary Disease (COPD)  General Interventions   General Interventions Discussed/Reviewed General Interventions Discussed  Education Interventions   Education Provided Provided Printed Education, Provided Verbal Education  Provided Verbal Education On When to see the doctor  [COPD action plan]  Pharmacy Interventions   Pharmacy Dicussed/Reviewed Pharmacy Topics Reviewed  [medications reviewed and compliance discussed. Patient scheduled for follow up with practice pharmacist today.]     Care Coordination Interventions: Provided patient with basic written and verbal COPD education on self care/management/and exacerbation prevention Discussed COPD triggers and ways to avoid Advised patient to self assesses COPD action plan zone and make appointment with provider if in the yellow zone for 48 hours without improvement Provided education about and advised patient to utilize infection prevention strategies to reduce risk of respiratory infection Evaluation of current treatment plan related to COPD and patient's adherence to plan as established by  provider Reviewed medications with patient and discussed importance of compliance Reviewed scheduled/upcoming provider appointments  Discussed plans with patient for ongoing care management follow up and provided patient with direct contact information for care management team            SDOH assessments and interventions completed:  Yes  SDOH Interventions Today    Flowsheet Row Most Recent Value  SDOH Interventions   Food Insecurity Interventions Intervention Not Indicated  Housing Interventions Intervention Not Indicated  Transportation Interventions Intervention Not Indicated        Care Coordination Interventions:  Yes, provided   Follow up plan: Follow up call scheduled for 05/02/22    Encounter Outcome:  Pt. Visit Completed   Quinn Plowman RN,BSN,CCM [place] [phone] direct line

## 2022-03-31 NOTE — Patient Instructions (Signed)
Visit Information  Phone number for Pharmacist: [phone]  Thank you for meeting with me to discuss your medications! Below is a summary of what we talked about during the visit:   Recommendations/Changes made from today's visit: -Advised to restart alendronate 70 mg once a week -Advised pt to contact provider when she is down to last Symbicort/Xarelto bottle so we can send refills  Follow up plan: -Health Concierge will call patient 2 months for Xarelto update -Pharmacist follow up televisit scheduled for 5 months   Charlene Brooke, PharmD, BCACP Clinical Pharmacist [facility] [phone]

## 2022-03-31 NOTE — Telephone Encounter (Signed)
Patient is almost out of methocarbamol. She reports she uses this PRN for hand cramping/pain. She requests refill to [facility] as her Rx is expired.  Last eRx per PCP: 01/30/22. #180 with 3 RF  [facility] ([place]), [place] [address] [place] ([place]) [place] [address] Phone: [phone] Fax: [phone]

## 2022-03-31 NOTE — Progress Notes (Signed)
Care Management & Coordination Services Pharmacy Note  03/31/2022 Name:  Jill Franco MRN:  [id number] DOB:  Jul 14, 1934  Summary: F/U visit -COPD: pt affirms compliance with Symbicort BID - she has 6 inhalers remaining from [employer] and will have to change to local pharmacy/mail order when these run out -Osteoporosis: pt has not been taking alendronate, this was on hold in the fall during GI issues, but these issues have since resolved. Reviewed benefits of alendronate for bone health and fracture risk reduction -PAD: she has 2 bottles of Xarelto remaining from [facility], she will need to change to local pharmacy/mail order when this runs out -Med updates: pt is no longer taking furosemide, Creon; she denies swelling issues and reports GI issues have resolved. Removed meds from list.   Recommendations/Changes made from today's visit: -Advised to restart alendronate 70 mg once a week -Advised pt to contact provider when she is down to last Symbicort/Xarelto bottle so we can send refills  Follow up plan: -S[facility]ard will call patient 2 months for Xarelto update -Pharmacist follow up televisit scheduled for 5 months    Subjective: Jill Franco is an 88 y.o. year old female who is a primary patient of Jill Franco, Jill Rising, MD.  The care coordination team was consulted for assistance with disease management and care coordination needs.    Engaged with patient by telephone for follow up visit.  Recent office visits: 11/11/21 Dr Jill Franco OV: hospital f/u - COPD exacerbation. pancreatic insuffuciency. Check on Creon.  10/28/21 Dr Jill Franco OV: diarrhea, dizziness. K 2.5. Rx KCL 20 meq BID x 5 days, repeat BMP 1 week.  08/17/21 Dr Jill Franco OV: cough - rx prednisone x 10 days. Updated metoprolol rx: 1/2 tab daily PRN SBP > 120  06/24/21 Dr Jill Franco OV: f/u - edema: try 2 lasix in AM. D/c pravastatin.  Recent consult visits: [facility]: 11/01/21 - 11/03/21 Admission [facility]): Pyelonephritis;  COPD exacerbation. Start z-pak, prednisone, benzonatate.   Objective:  Lab Results  Component Value Date   CREATININE 0.87 01/17/2022   BUN 15 01/17/2022   GFR 60.65 01/17/2022   EGFR 61 04/06/2021   GFRNONAA >60 11/03/2021   GFRAA >60 04/26/2019   NA 135 01/17/2022   K 4.2 01/17/2022   CALCIUM 9.0 01/17/2022   CO2 26 01/17/2022   GLUCOSE 96 01/17/2022    Lab Results  Component Value Date/Time   HGBA1C 5.7 09/18/2020 09:18 AM   GFR 60.65 01/17/2022 10:35 AM   GFR 64.24 11/29/2021 09:26 AM    Last diabetic Eye exam: No results found for: "HMDIABEYEEXA"  Last diabetic Foot exam: No results found for: "HMDIABFOOTEX"   Lab Results  Component Value Date   CHOL 114 12/04/2020   HDL 71 12/04/2020   LDLCALC 36 12/04/2020   LDLDIRECT 115.8 06/19/2012   TRIG 37 12/04/2020   CHOLHDL 1.6 12/04/2020       Latest Ref Rng & Units 11/29/2021    9:26 AM 11/11/2021   11:21 AM 11/03/2021    1:19 AM  Hepatic Function  Total Protein 6.0 - 8.3 g/dL 7.3  6.6  5.7   Albumin 3.5 - 5.2 g/dL 4.0  3.2  2.5   AST 0 - 37 U/L 22  42  63   ALT 0 - 35 U/L 17  78  45   Alk Phosphatase 39 - 117 U/L 83  112  75   Total Bilirubin 0.2 - 1.2 mg/dL 0.4  0.7  0.3  Lab Results  Component Value Date/Time   TSH 2.82 06/16/2020 12:41 PM   TSH 1.18 09/12/2019 11:10 AM   FREET4 1.1 09/23/2008 09:07 AM   FREET4 1.1 06/04/2007 10:00 AM       Latest Ref Rng & Units 03/22/2022    9:58 AM 01/17/2022   10:35 AM 11/29/2021    9:26 AM  CBC  WBC 4.0 - 10.5 K/uL 5.5  7.6  5.7   Hemoglobin 12.0 - 15.0 g/dL 11.6  11.4  11.6   Hematocrit 36.0 - 46.0 % 37.2  37.0  36.7   Platelets 150.0 - 400.0 K/uL 234.0  290.0  400.0    Iron/TIBC/Ferritin/ %Sat    Component Value Date/Time   IRON 24 (L) 03/22/2022 0958   TIBC 287 11/03/2021 0119   FERRITIN 9.1 (L) 03/22/2022 0958   IRONPCTSAT 6 (L) 11/03/2021 0119    Lab Results  Component Value Date/Time   VD25OH 53.92 10/22/2020 10:26 AM   VD25OH 26 (L)  09/23/2008 09:54 PM   VITAMINB12 229 03/22/2022 09:58 AM   VITAMINB12 1,011 (H) 11/03/2021 01:19 AM    Clinical ASCVD: Yes  The ASCVD Risk score (Arnett DK, et al., 2019) failed to calculate for the following reasons:   The 2019 ASCVD risk score is only valid for ages 31 to 80   The patient has a prior MI or stroke diagnosis        09/28/2021    9:35 AM 09/18/2020    9:11 AM 09/12/2019   10:09 AM  Depression screen PHQ 2/9  Decreased Interest 0 0 0  Down, Depressed, Hopeless 0 0 0  PHQ - 2 Score 0 0 0  Altered sleeping 0 0   Tired, decreased energy 0 0   Change in appetite 0 0   Feeling bad or failure about yourself  0 0   Trouble concentrating 0 0   Moving slowly or fidgety/restless 0 0   Suicidal thoughts 0 0   PHQ-9 Score 0 0   Difficult doing work/chores Not difficult at all Not difficult at all      Social History   Tobacco Use  Smoking Status Former   Packs/day: 0.25   Years: 69.00   Total pack years: 17.25   Types: Cigarettes   Quit date: 01/2021   Years since quitting: 1.1   Passive exposure: Current  Smokeless Tobacco Never   BP Readings from Last 3 Encounters:  11/11/21 102/62  11/03/21 120/60  10/28/21 (!) 154/76   Pulse Readings from Last 3 Encounters:  11/11/21 (!) 102  11/03/21 89  10/28/21 69   Wt Readings from Last 3 Encounters:  11/11/21 109 lb (49.4 kg)  11/01/21 107 lb (48.5 kg)  10/28/21 107 lb 6 oz (48.7 kg)   BMI Readings from Last 3 Encounters:  11/11/21 18.71 kg/m  11/01/21 18.37 kg/m  10/28/21 18.43 kg/m    Allergies  Allergen Reactions   Advil [Ibuprofen] Nausea And Vomiting   Aspirin Other (See Comments)    Uncoated aspirin causes stomach upset on empty stomach   Codeine Nausea Only and Other (See Comments)    Pill needs to be E.coated   Lipitor [Atorvastatin] Other (See Comments)    myalgia   Other Itching and Rash    Nicotine patches     Medications Reviewed Today     Reviewed by Charlton Haws, [place]  (Pharmacist) on 03/31/22 at 73  Med List Status: <None>   Medication Order Taking? Sig Documenting Provider  Last Dose Status Informant  acetaminophen (TYLENOL) 650 MG CR tablet [id number] Yes Take 650 mg by mouth every 8 (eight) hours as needed for pain. [provider] Taking Active Self  albuterol (VENTOLIN HFA) 108 (90 Base) MCG/ACT inhaler [id number] Yes USE 2 INHALATIONS BY MOUTH  INTO THE LUNGS EVERY 6  HOURS AS NEEDED FOR  WHEEZING OR SHORTNESS OF  BREATH  Patient taking differently: Inhale 2 puffs into the lungs every 6 (six) hours as needed for wheezing or shortness of breath.   Tonia Ghent, MD Taking Active Self  alendronate (FOSAMAX) 70 MG tablet [id number] No Take 1 tablet (70 mg total) by mouth every 7 (seven) days. Take with a full glass of water on an empty stomach.  Patient not taking: Reported on 03/31/2022   Tonia Ghent, MD Not Taking Active Self           Med Note Harvel Ricks   Tue Nov 02, 2021 12:43 PM) On hold to see if diarrhea stops  aspirin EC 81 MG tablet [id number] Yes Take 1 tablet (81 mg total) by mouth daily. Swallow whole. Cantwell, Celeste C, PA-C Taking Active Self  budesonide-formoterol (SYMBICORT) 160-4.5 MCG/ACT inhaler [id number] Yes Inhale 2 puffs into the lungs in the morning and at bedtime. Tonia Ghent, MD Taking Active Self  Cholecalciferol (VITAMIN D3) 25 MCG (1000 UT) CAPS [id number] Yes Take 1 capsule (1,000 Units total) by mouth daily. Tonia Ghent, MD Taking Active Self  cyanocobalamin (VITAMIN B12) 1000 MCG tablet [id number] Yes Take 1 tablet (1,000 mcg total) by mouth daily. Tonia Ghent, MD Taking Active   diclofenac Sodium (VOLTAREN) 1 % GEL [id number] Yes Apply 2 g topically 4 (four) times daily. Tonia Ghent, MD Taking Active   ferrous sulfate 325 (65 FE) MG tablet [id number] Yes Take 1 tablet (325 mg total) by mouth daily with breakfast. May cause stools to become darker and may cause constipation Tonia Ghent, MD Taking Active   Patient not taking:  Discontinued 03/31/22 1615 (Patient Preference) levothyroxine (SYNTHROID) 50 MCG tablet [id number] Yes TAKE 1 TABLET BY MOUTH  DAILY EXCEPT 2 TABLETS ON  SUNDAYS AND WEDNESDAYS (  TOTAL 9 TABS PER WEEK) Tonia Ghent, MD Taking Active Self  Patient not taking:  Discontinued 03/31/22 1616 (Patient Preference)   melatonin 5 MG TABS [id number] Yes Take 5 mg by mouth at bedtime as needed (For sleep). [provider] Taking Active Self  methocarbamol (ROBAXIN) 500 MG tablet [id number] Yes Take 500 mg by mouth. Patient states she takes 1 Q12 hr as needed [provider] Taking Active Self  metoprolol tartrate (LOPRESSOR) 100 MG tablet [id number] No TAKE ONE-HALF TABLET BY  MOUTH IN THE MORNING AND 1  TABLET BY MOUTH IN THE  EVENING IF BP IS ABOVE 140/90.  Patient not taking: Reported on 03/31/2022   Tonia Ghent, MD Not Taking Active Self           Med Note Nyoka Cowden, Delice Lesch   Thu Mar 31, 2022 11:51 AM) Patient states she takes as needed for elevated blood pressure  Patient not taking:  Discontinued 03/31/22 1616 (Completed Course)   rivaroxaban (XARELTO) 2.5 MG TABS tablet [id number] Yes Take 1 tablet (2.5 mg total) by mouth 2 (two) times daily. Tonia Ghent, MD Taking Active   rosuvastatin (CRESTOR) 20 MG tablet [id number] Yes TAKE 1 TABLET BY MOUTH DAILY Tonia Ghent, MD Taking Active  SDOH:  (Social Determinants of Health) assessments and interventions performed: No SDOH Interventions    Flowsheet Row Telephone from 03/31/2022 in [place] Telephone from 11/04/2021 in [place] Coordination Clinical Support from 09/28/2021 in [place] [facility] [place] Management from 05/11/2021 in [place] [facility] [place] Interventions Intervention Not Indicated --  Intervention Not Indicated Intervention Not Indicated  Housing Interventions Intervention Not Indicated -- Intervention Not Indicated --  Transportation Interventions Intervention Not Indicated Intervention Not Indicated Intervention Not Indicated --  Financial Strain Interventions -- Intervention Not Indicated Intervention Not Indicated Other (Comment)  [Symbicort - [employer]Me enrolled]  Physical Activity Interventions -- -- Intervention Not Indicated --  Stress Interventions -- -- Intervention Not Indicated --  Social Connections Interventions -- -- Intervention Not Indicated --       Medication Assistance:  Creon - [employer] PAP 2023 approved Symbicort - [employer]Me PAP 2023 approved  Medication Access: Within the past 30 days, how often has patient missed a dose of medication? 0 Is a pillbox or other method used to improve adherence? Yes  Factors that may affect medication adherence? no barriers identified Are meds synced by current pharmacy? No  Are meds delivered by current pharmacy? Yes  Does patient experience delays in picking up medications due to transportation concerns? No   [facility] Services Reviewed: Is patient disadvantaged to use [facility]?: Yes  Current Rx insurance plan: [facility] [facility] Name and location of Current pharmacy:  [place], [address] [place] [address] Phone: [phone] Fax: [place] [address] [place]), [place] [address] [place]) [place] [address] Phone: [phone] Fax: [phone]  [facility] services reviewed with patient today?: No  Patient requests to transfer care to [facility]?: No  Reason patient declined to change pharmacies: Disadvantaged due to insurance/mail order  Compliance/Adherence/Medication fill history: Care Gaps: None  Star-Rating Drugs: Rosuvastatin - PDC  100%   Assessment/Plan  Hypertension (BP goal <130/80) -Controlled - BP is at goal in office; she is no longer taking metoprolol or furosemide, pt denies s/sx of hypotension since stopping metoprolol; she denies swelling issues since stopping furosemide -Current home readings: < 140/90 -Denies hypotensive/hypertensive symptoms -Current treatment: Furosemide 20 mg PRN  - not taking. Removed from list. Metoprolol tartrate 100 mg -1/2 tab PRN for BP > 140/90 - Appropriate, Effective, Safe, Accessible -Medications previously tried: HCTZ, enalapril  -Educated on BP goals and benefits of medications for prevention of heart attack, stroke and kidney damage; Daily salt intake goal < 2300 mg; Symptoms of hypotension and importance of maintaining adequate hydration; -Counseled to monitor BP at home daily -Recommended to continue current medication  Hyperlipidemia / ASCVD (LDL goal < 70) -Controlled - LDL 36 (11/2020) at goal; pt endorses compliance with rosuvastatin -Pt was previously getting Xarelto through [facility] program for donut hole help; in 2024 she will get this through regular pharmacy until she reaches donut hole again; she has 2 bottles of Xarelto remaining  -Hx PAD. Hx CVA (MRI 2019) -Current treatment: Rosuvastatin 20 mg daily - Appropriate, Effective, Safe, Accessible Xarelto 2.5 mg BID - Appropriate, Effective, Safe, Accessible Aspirin 81 mg daily - Appropriate, Effective, Safe, Accessible -Medications previously tried: clopidogrel, pravastatin -Educated on Cholesterol goals; Benefits of statin for ASCVD risk  reduction; -Discussed low-dose Xarelto indication for PAD -Recommended to continue current medication; when Xarelto runs out she will need Rx for mail order/local pharmacy  COPD (Goal: control symptoms and prevent exacerbations) -Controlled - pt reports using Symbicort BID; pt reports rare use of albuterol; she had Symbicort through [employer] -Gold Grade: unable to  assess -Current COPD Classification:  E (exacerbation leading to hospitalization OR 2+ moderate exacerbations) -MMRC/CAT score: not on file -Pulmonary function testing: not on file -Exacerbations requiring treatment in last 6 months: 1 (10/2021) -Current treatment  Symbicort HFA 160-4.5 mcg/act 2 puff BID - Appropriate, Effective, Safe, Query Accessible Albuterol HFA PRN -Appropriate, Effective, Safe, Accessible -Medications previously tried: n/a  -Patient denies consistent use of maintenance inhaler -Frequency of rescue inhaler use: daily or QOD -Counseled on Proper inhaler technique; Benefits of consistent maintenance inhaler use -Recommended to continue current medication; when Symbicort runs out she will need Rx for mail order/local pharmacy  Osteoporosis (Goal prevent fractures) -Not ideally controlled - pt has discussed treatment options with PCP previously but opted to defer until after vascular surgery; she has now completed procedure and open to starting treatment; she denies significant issues with reflux/heartburn -Last DEXA Scan: 09/2020   T-Score femoral neck: -3.1  T-Score forearm radius: -2.5 -Patient is a candidate for pharmacologic treatment due to T-Score < -2.5 in femoral neck -Current treatment  Vitamin D 1000 IU daily -Appropriate, Effective, Safe, Accessible Alendronate 70 mg weekly - on hold ? -Medications previously tried: n/a  -Recommend [phone] units of vitamin D daily. Counseled on oral bisphosphonate administration: take in the morning, 30 minutes prior to food with 6-8 oz of water. Do not lie down for at least 30 minutes after taking. Recommend weight-bearing and muscle strengthening exercises for building and maintaining bone density. -Recommend alendronate 70 mg weekly - consulting with PCP.  Hypothyroidism (Goal: maintain TSH in goal range) -Controlled - TSH at goal; pt takes levothyroxine with other meds in the morning -Current treatment  Levothyroxine 50  mcg daily except 2 tab Sun/Wed - Appropriate, Effective, Safe, Accessible -Medications previously tried: n/a -Advised pt to continue same administration (do not move levothyroxine as TSH is at goal)  -Recommended to continue current medication  Health Maintenance -Vaccine gaps: TDAP, covid booster -Takes methocarbamol once a day for hand cramping -Pt is no longer taking Creon, she reports GI issues have resolved   Charlene Brooke, PharmD, BCACP Clinical Pharmacist [facility] [phone]

## 2022-04-01 ENCOUNTER — Other Ambulatory Visit: Payer: Self-pay | Admitting: Family Medicine

## 2022-04-01 MED ORDER — PANCRELIPASE (LIP-PROT-AMYL) 36000-114000 UNITS PO CPEP: ORAL_CAPSULE | ORAL | 11 refills | Status: DC

## 2022-04-02 ENCOUNTER — Other Ambulatory Visit: Payer: Self-pay | Admitting: Family Medicine

## 2022-04-04 ENCOUNTER — Telehealth: Payer: Self-pay | Admitting: Family Medicine

## 2022-04-04 NOTE — Telephone Encounter (Signed)
Daughter called in stating that medication lipase/protease/amylase (CREON) 36000 UNITS CPEP capsule  is running them 400 dollars and was advised to call back if this was the amount to see if they could get assistance.

## 2022-04-05 MED ORDER — METHOCARBAMOL 500 MG PO TABS: 500.0000 mg | ORAL_TABLET | Freq: Two times a day (BID) | ORAL | 1 refills | Status: DC | PRN

## 2022-04-05 NOTE — Addendum Note (Signed)
Addended by: Tonia Ghent on: 04/05/2022 02:05 PM   Modules accepted: Orders

## 2022-04-05 NOTE — Telephone Encounter (Signed)
Sent. Thanks.   

## 2022-04-05 NOTE — Telephone Encounter (Signed)
Given her weight loss, I asked her to retry creon.  Is there anyway to get assistance on this med?   Thanks.

## 2022-04-06 NOTE — Telephone Encounter (Cosign Needed)
Creon assistance forms ready for review.   Avel Sensor, [place] Assistant [phone]

## 2022-04-06 NOTE — Telephone Encounter (Signed)
Reviewed Creon forms. Printed and placed at front desk for patient signature.  Please advise pt she will need to come sign forms and provide income documents from 2023.

## 2022-04-06 NOTE — Telephone Encounter (Cosign Needed)
Contacted the patient.The patient will come by [facility] tomorrow 04/07/22 to sign patient assistance forms for the Creon.The patient will bring financials from 2023.   Avel Sensor, [place] Assistant [phone]

## 2022-04-22 NOTE — Telephone Encounter (Signed)
Pt's daughter called stating the pt to take b12 & iron meds, pt's daughter is asking how often does she need to take it? Call back # [phone]

## 2022-04-22 NOTE — Telephone Encounter (Signed)
Per EMR, B12 and iron is to be taken daily. Called patients daughter and relayed information.

## 2022-04-25 ENCOUNTER — Telehealth: Payer: Self-pay

## 2022-04-25 NOTE — Progress Notes (Cosign Needed)
Care Management & Coordination Services Pharmacy Team  Reason for Encounter: Patient assistance renewal   Patient is currently enrolled in [facility] Assist patient assistance program for the medication Creon  until date: 02/21/2023 Patient would like to re-enroll in patient assistance for the next calendar year.  The application was downloaded on behalf of the patient. Forms were forwarded to clinical pharmacist for review and to determine next steps.Per Dina Rich ,new shipment sent out 04/22/22.  Charlene Brooke, PharmD notified  Jill Franco, [place] Assistant [phone]

## 2022-05-02 ENCOUNTER — Ambulatory Visit: Payer: Self-pay

## 2022-05-02 NOTE — Patient Instructions (Signed)
Visit Information  Thank you for taking time to visit with me today. Please don't hesitate to contact me if I can be of assistance to you.   Following are the goals we discussed today:   Goals Addressed             This Visit's Progress    "I'd like to focus on my COPD       Interventions Today    Flowsheet Row Most Recent Value  Chronic Disease   Chronic disease during today's visit Chronic Obstructive Pulmonary Disease (COPD)  General Interventions   General Interventions Discussed/Reviewed General Interventions Reviewed, Doctor Visits  [verbally assessed if experiencing anxiety/ depression symptoms.]  Doctor Visits Discussed/Reviewed Doctor Visits Reviewed  Exercise Interventions   Exercise Discussed/Reviewed Physical Activity  Physical Activity Discussed/Reviewed Physical Activity Reviewed  [Advised to increase activity as tolerated.  Walk more inside home.  Get up and move several times per day.]  Education Interventions   Education Provided Provided Education  Provided Verbal Education On Other  [Discussed COPD action plan and verbally assessed for current zone level. Discussed importance of contacting doctor regarding symptoms when in yellow zone. Discussed smoking cessation and strategies.]  Pharmacy Interventions   Pharmacy Dicussed/Reviewed Pharmacy Topics Reviewed  [medications reviewed and compliance discussed.]                 Our next appointment is by telephone on 06/09/22 at 11 am  Please call the care guide team at [phone] if you need to cancel or reschedule your appointment.   If you are experiencing a Mental Health or [place] or need someone to talk to, please call the Suicide and Crisis Lifeline: 988 call 1-800-273-TALK (toll free, 24 hour hotline)  The patient verbalized understanding of instructions, educational materials, and care plan provided today and agreed to receive a mailed copy of patient instructions, educational  materials, and care plan.   Quinn Plowman RN,BSN,CCM [place] Coordination [phone] direct line

## 2022-05-02 NOTE — Patient Outreach (Signed)
  Care Coordination   Follow Up Visit Note   05/02/2022 Name: Jill Franco MRN: [id number] DOB: 21-Jul-1934  Jill Franco is a 88 y.o. year old female who sees Tonia Ghent, MD for primary care. I spoke with  [name] by phone today.  What matters to the patients health and wellness today?  Patient states she has good and bad days. She reports today is a great day. She states she is not feeling fatigues and no increase in COPD symptoms.   Patient states she is in the [place] zone today of COPD action plan.  Patient denies any anxiety/ depression symptoms She reports her appetite is good.  Patient states she still smokes 3-4 cigarette per day. She states he has tried everything in the past to stop smoking.     Goals Addressed             This Visit's Progress    "I'd like to focus on my COPD       Interventions Today    Flowsheet Row Most Recent Value  Chronic Disease   Chronic disease during today's visit Chronic Obstructive Pulmonary Disease (COPD)  General Interventions   General Interventions Discussed/Reviewed General Interventions Reviewed, Doctor Visits  [verbally assessed if experiencing anxiety/ depression symptoms.]  Doctor Visits Discussed/Reviewed Doctor Visits Reviewed  Exercise Interventions   Exercise Discussed/Reviewed Physical Activity  Physical Activity Discussed/Reviewed Physical Activity Reviewed  [Advised to increase activity as tolerated.  Walk more inside home.  Get up and move several times per day.]  Education Interventions   Education Provided Provided Education  Provided Verbal Education On Other  [Discussed COPD action plan and verbally assessed for current zone level. Discussed importance of contacting doctor regarding symptoms when in yellow zone. Discussed smoking cessation and strategies.]  Pharmacy Interventions   Pharmacy Dicussed/Reviewed Pharmacy Topics Reviewed  [medications reviewed and compliance discussed.]                  SDOH assessments and interventions completed:  No     Care Coordination Interventions:  Yes, provided   Follow up plan: Follow up call scheduled for 06/09/22    Encounter Outcome:  Pt. Visit Completed   Quinn Plowman RN,BSN,CCM [place] [phone] direct line

## 2022-05-10 ENCOUNTER — Telehealth: Payer: Self-pay | Admitting: Pharmacist

## 2022-05-10 DIAGNOSIS — I739 Peripheral vascular disease, unspecified: Secondary | ICD-10-CM

## 2022-05-10 MED ORDER — XARELTO 2.5 MG PO TABS: 2.5000 mg | ORAL_TABLET | Freq: Two times a day (BID) | ORAL | 1 refills | Status: DC

## 2022-05-10 NOTE — Telephone Encounter (Signed)
Erx sent

## 2022-05-10 NOTE — Telephone Encounter (Signed)
Patient reports she has ~2 week supply remaining of Xarelto, which she received from [facility] program in 2023. She needs Rx sent to her regular mail order pharmacy now.  [place], [place] [address] [place] [address] Phone: [phone] Fax: [phone]

## 2022-05-12 ENCOUNTER — Other Ambulatory Visit (INDEPENDENT_AMBULATORY_CARE_PROVIDER_SITE_OTHER): Payer: Medicare Other

## 2022-05-12 DIAGNOSIS — D649 Anemia, unspecified: Secondary | ICD-10-CM

## 2022-05-12 DIAGNOSIS — K8681 Exocrine pancreatic insufficiency: Secondary | ICD-10-CM

## 2022-05-12 LAB — CBC WITH DIFFERENTIAL/PLATELET
Basophils Absolute: 0.1 10*3/uL (ref 0.0–0.1)
Basophils Relative: 1.3 % (ref 0.0–3.0)
Eosinophils Absolute: 0.1 10*3/uL (ref 0.0–0.7)
Eosinophils Relative: 2 % (ref 0.0–5.0)
HCT: 42.3 % (ref 36.0–46.0)
Hemoglobin: 13.1 g/dL (ref 12.0–15.0)
Lymphocytes Relative: 11.1 % — ABNORMAL LOW (ref 12.0–46.0)
Lymphs Abs: 0.6 10*3/uL — ABNORMAL LOW (ref 0.7–4.0)
MCHC: 31 g/dL (ref 30.0–36.0)
MCV: 78.9 fl (ref 78.0–100.0)
Monocytes Absolute: 0.6 10*3/uL (ref 0.1–1.0)
Monocytes Relative: 10.8 % (ref 3.0–12.0)
Neutro Abs: 4.1 10*3/uL (ref 1.4–7.7)
Neutrophils Relative %: 74.8 % (ref 43.0–77.0)
Platelets: 221 10*3/uL (ref 150.0–400.0)
RBC: 5.36 Mil/uL — ABNORMAL HIGH (ref 3.87–5.11)
RDW: 24.6 % — ABNORMAL HIGH (ref 11.5–15.5)
WBC: 5.4 10*3/uL (ref 4.0–10.5)

## 2022-05-12 LAB — COMPREHENSIVE METABOLIC PANEL
ALT: 14 U/L (ref 0–35)
AST: 22 U/L (ref 0–37)
Albumin: 4.3 g/dL (ref 3.5–5.2)
Alkaline Phosphatase: 69 U/L (ref 39–117)
BUN: 21 mg/dL (ref 6–23)
CO2: 28 mEq/L (ref 19–32)
Calcium: 9.2 mg/dL (ref 8.4–10.5)
Chloride: 104 mEq/L (ref 96–112)
Creatinine, Ser: 0.83 mg/dL (ref 0.40–1.20)
GFR: 64.03 mL/min (ref >60.00)
Glucose, Bld: 98 mg/dL (ref 70–99)
Potassium: 4 mEq/L (ref 3.5–5.1)
Sodium: 140 mEq/L (ref 135–145)
Total Bilirubin: 0.4 mg/dL (ref 0.2–1.2)
Total Protein: 7.2 g/dL (ref 6.0–8.3)

## 2022-05-12 LAB — FERRITIN: Ferritin: 24 ng/mL (ref 10.0–291.0)

## 2022-05-12 LAB — TSH: TSH: 0.86 u[IU]/mL (ref 0.35–5.50)

## 2022-05-12 LAB — IRON: Iron: 37 ug/dL — ABNORMAL LOW (ref 42–145)

## 2022-05-12 LAB — VITAMIN B12: Vitamin B-12: >1500 pg/mL — ABNORMAL HIGH (ref 211–911)

## 2022-05-13 ENCOUNTER — Other Ambulatory Visit: Payer: Self-pay | Admitting: Family Medicine

## 2022-05-13 DIAGNOSIS — D649 Anemia, unspecified: Secondary | ICD-10-CM

## 2022-05-13 MED ORDER — VITAMIN B 12 500 MCG PO TABS: 500.0000 ug | ORAL_TABLET | Freq: Every day | ORAL | Status: DC

## 2022-05-23 ENCOUNTER — Ambulatory Visit (INDEPENDENT_AMBULATORY_CARE_PROVIDER_SITE_OTHER): Payer: Medicare Other | Admitting: Family Medicine

## 2022-05-23 ENCOUNTER — Encounter: Payer: Self-pay | Admitting: Family Medicine

## 2022-05-23 VITALS — BP 130/82 | HR 83 | Temp 98.0°F | Ht 64.0 in | Wt 104.0 lb

## 2022-05-23 DIAGNOSIS — K8689 Other specified diseases of pancreas: Secondary | ICD-10-CM | POA: Diagnosis not present

## 2022-05-23 DIAGNOSIS — R0600 Dyspnea, unspecified: Secondary | ICD-10-CM | POA: Diagnosis not present

## 2022-05-23 MED ORDER — FUROSEMIDE 20 MG PO TABS: ORAL_TABLET | ORAL | Status: DC

## 2022-05-23 MED ORDER — FERROUS SULFATE 325 (65 FE) MG PO TABS: 325.0000 mg | ORAL_TABLET | ORAL | Status: DC

## 2022-05-23 NOTE — Progress Notes (Signed)
Symptoms going on for about 3 weeks. Patient states she did not have much of an appetite and was not eating much. Patient also stated that she started to get an appetite again about a week ago and has started to gain a little weight back.   Weight 109 prev, down to 104 today.    She had ham yesterday and her ankles are puffier today. Discussed lasix use.  See orders.  Discussed limiting salt intake.  She has been on creon consistently.    Meds, vitals, and allergies reviewed.   ROS: Per HPI unless specifically indicated in ROS section   GEN: nad, alert and oriented HEENT: ncat NECK: supple w/o LA CV: rrr. PULM: ctab, no inc wob ABD: soft, +bs ZOX:WRUEA BLE edema.   SKIN: no acute rash  30 minutes were devoted to patient care in this encounter (this includes time spent reviewing the patient's file/history, interviewing and examining the patient, counseling/reviewing plan with patient).

## 2022-05-23 NOTE — Patient Instructions (Addendum)
If you have any more weight loss, if any greasy stools, or decrease in appetite let me know.  Keep taking creon for now.  We may need to adjust the dose.   Recheck labs later this year.   Lasix if needed for fluid, but limit salt.  Take care.  Glad to see you.

## 2022-05-24 ENCOUNTER — Telehealth: Payer: Self-pay

## 2022-05-24 NOTE — Patient Outreach (Signed)
  Care Coordination   Follow Up Visit Note   05/24/2022 Name: Jill Franco MRN: [id number] DOB: 09-08-1934  Jill Franco is a 88 y.o. year old female who sees Tonia Ghent, MD for primary care. Telephone call returned to patient. I spoke with  Jill Franco by phone today.  What matters to the patients health and wellness today?  Patient states she called RNCM because she thought she needed a refill on her medication. She states her medication came in the mail over the weekend.   Patient states she is still losing weight. She reports seeing her primary care provider on yesterday and was given instructions regarding her Creon medication and meal/ snack intake.  Per primary provider note from 05/23/22 patient's weight was 104 lbs from 109 previously.   Patient reports her weight at home this morning was 100.8.  She states her scale may be different from the doctors scale. Patient states she is taking the Creon with her two meals daily and eating a snack. She states she is also drinking 1 Boost daily. Patient denies any increase in heart failure symptoms.     Goals Addressed             This Visit's Progress    Patient stated goal: "I'd like to focus on my COPD and health conditions"       Interventions Today    Flowsheet Row Most Recent Value  Chronic Disease   Chronic disease during today's visit Other, Congestive Heart Failure (CHF)  [weight loss]  General Interventions   General Interventions Discussed/Reviewed General Interventions Reviewed  [Evaluation of current treatment plan related to weight loss and patient's adherence to plan as established by provider. Assessed for current meal choices. Assessed today's weight. Assessed for signs/ symptoms of heart failure symptoms.]  Education Interventions   Education Provided Provided Education  Provided Verbal Education On Nutrition  [Encouraged patient to consider occasional milk shake.  Discussed importance of well balanced meal  and examples provider. Advised to continue drinking boost daily for increase protein consumption.]  Nutrition Interventions   Nutrition Discussed/Reviewed Nutrition Reviewed, Adding fruits and vegetables, Increaing proteins  Pharmacy Interventions   Pharmacy Dicussed/Reviewed Pharmacy Topics Reviewed  [medications reviewed and compliance discussed.  Reviewed primary provider visit note from 05/23/22 with patient and confirmed she is taking Creon per provider recommendation.]                 SDOH assessments and interventions completed:  No     Care Coordination Interventions:  Yes, provided   Follow up plan: Follow up call scheduled for 06/22/22    Encounter Outcome:  Pt. Visit Completed   Quinn Plowman RN,BSN,CCM [facility] [phone] direct line

## 2022-05-25 NOTE — Assessment & Plan Note (Signed)
Discussed limiting salt intake and using Lasix as needed.  Discussed rationale for creatinine use.  We may end up having to increase her Creon dose, depending on her weight.  If any more weight loss, if any greasy stools, or decrease in appetite let me know.  Keep taking creon for now.   Recheck labs later this year.   Lasix if needed for fluid, but limit salt.

## 2022-05-31 ENCOUNTER — Telehealth: Payer: Self-pay | Admitting: Family Medicine

## 2022-05-31 MED ORDER — PANCRELIPASE (LIP-PROT-AMYL) 36000-114000 UNITS PO CPEP: ORAL_CAPSULE | ORAL | 11 refills | Status: DC

## 2022-05-31 NOTE — Telephone Encounter (Signed)
Would continue.  Rx sent.  Thanks.

## 2022-05-31 NOTE — Telephone Encounter (Signed)
Patient notified to continue rx and that it has been refilled.

## 2022-05-31 NOTE — Telephone Encounter (Signed)
Patient called our office wondering if she needed to continue taking medication lipase/protease/amylase (CREON) 36000 UNITS CPEP capsule, says she has 5 days left. Wanted to let Dr. Para March know that she has not gained any weight, and is eating as she normally was. If she does need to continue this, she will need a refill.    Prescription Request  05/31/2022  LOV: 05/23/2022  What is the name of the medication or equipment? lipase/protease/amylase (CREON) 36000 UNITS CPEP capsule    Have you contacted your pharmacy to request a refill? No   Which pharmacy would you like this sent to?  [facility] - [place], [place] - [address] [place] [address] Phone: [phone] Fax: [phone]  Patient notified that their request is being sent to the clinical staff for review and that they should receive a response within 2 business days.   Please advise at [facility] [phone]

## 2022-06-02 ENCOUNTER — Telehealth: Payer: Self-pay | Admitting: Family Medicine

## 2022-06-02 NOTE — Telephone Encounter (Signed)
Patient called stating the pharmacy in which medication lipase/protease/amylase (CREON) 36000 UNITS CPEP capsule was sent to had called her to say the total for her medication was a little over $4000, wants to know if there is a different medication Dr. Para March would recommend for her to take that is covered by her insurance. Please advise [phone].

## 2022-06-03 NOTE — Telephone Encounter (Signed)
I need help from [facility] with pharmacy.  Is there any way to get this covered?  Thanks.

## 2022-06-03 NOTE — Progress Notes (Signed)
Care Management & Coordination Services Pharmacy Team  Reason for Encounter: Reorder Creon from manufacturer   Spoke with patient on 06/03/2022   Contacted [employer] regarding reorder for the patient.Manufacturer confirmed last delivery 04/29/22 for 33ds. Manufacturer needed clarification on application. Resolved and will send another order to the patients home address.Patient notified.   Al Corpus, PharmD notified  Burt Knack, [facility] Clinical Pharmacy Assistant [phone]

## 2022-06-03 NOTE — Telephone Encounter (Signed)
Called patient and reviewed all information. Patient verbalized understanding. Will call if any further questions.  

## 2022-06-03 NOTE — Telephone Encounter (Signed)
Creon PAP has already been approved for her last month, it should be coming directly from company.  [place] - please call [employer] and make sure they are shipping it to her. Also call patient and explain medication will come from the company, not her pharmacy.

## 2022-06-22 ENCOUNTER — Ambulatory Visit: Payer: Self-pay

## 2022-06-22 NOTE — Patient Outreach (Signed)
  Care Coordination   Follow Up Visit Note   06/22/2022 Name: TWANISHA FOULK MRN: [id number] DOB: [birth year]  ZAEDA MCFERRAN is a 88 y.o. year old female who sees Joaquim Nam, MD for primary care. I spoke with  [name] by phone today.  What matters to the patients health and wellness today?  Patient states overall she is doing well.  Patient reports her weight ranges from 101-103 lbs. She states she is still drinking 1-2 boost per day and eating ice cream to help with caloric intake. Patient reports having some LE swelling. She states she took a lasix as instructed but will not take anymore until her daughter returns from vacation. She states the lasix can make her dizzy.  Patient denies any increase in shortness of breath. She states she occasionally has to use her albuterol inhaler in the afternoon.     Goals Addressed             This Visit's Progress    Patient stated goal: "I'd like to focus on my COPD and health conditions"       Interventions Today    Flowsheet Row Most Recent Value  Chronic Disease   Chronic disease during today's visit Chronic Obstructive Pulmonary Disease (COPD), Other  [pancreatic insufficiency]  General Interventions   General Interventions Discussed/Reviewed Doctor Visits, Labs  [evaluated current treatment plan for COPD/ pancreatic insufficiency and patients adherence to plan as established by provider.  Assessed for patients weight. Assessed for COPD symptoms.]  Labs --  [confirmed patient aware that next lab appointment is 08/15/22.]  Doctor Visits Discussed/Reviewed Doctor Visits Reviewed  Annabell Sabal scheduled/ upcoming provider appointments.]  Education Interventions   Education Provided Provided Education  Provided Verbal Education On Other  [Advised to elevate legs when sitting and/ or wear compression hose to help with decreasing leg swelling. Advised to follow provider instruction when taking lasix for leg swelling.]  Nutrition  Interventions   Nutrition Discussed/Reviewed Nutrition Reviewed, Increaing proteins  [Encouraged to eat lean proteins and continue to drink boost daily.]  Pharmacy Interventions   Pharmacy Dicussed/Reviewed Pharmacy Topics Reviewed  [reviewed medications and discussed compliance. Confirmed patient has all medications on hand and encouraged patient to get refills in timely fashion.]                 SDOH assessments and interventions completed:  No     Care Coordination Interventions:  Yes, provided   Follow up plan: Follow up call scheduled for 08/19/22    Encounter Outcome:  Pt. Visit Completed   George Ina RN,BSN,CCM [facility] Care Coordination [phone] direct line

## 2022-07-18 ENCOUNTER — Other Ambulatory Visit: Payer: Self-pay | Admitting: Family Medicine

## 2022-07-18 DIAGNOSIS — I739 Peripheral vascular disease, unspecified: Secondary | ICD-10-CM

## 2022-07-25 ENCOUNTER — Other Ambulatory Visit: Payer: Self-pay | Admitting: Family Medicine

## 2022-07-25 DIAGNOSIS — R0600 Dyspnea, unspecified: Secondary | ICD-10-CM

## 2022-08-15 ENCOUNTER — Other Ambulatory Visit (INDEPENDENT_AMBULATORY_CARE_PROVIDER_SITE_OTHER): Payer: Medicare Other

## 2022-08-15 DIAGNOSIS — D649 Anemia, unspecified: Secondary | ICD-10-CM

## 2022-08-15 LAB — CBC WITH DIFFERENTIAL/PLATELET
Basophils Absolute: 0.1 10*3/uL (ref 0.0–0.1)
Basophils Relative: 1.2 % (ref 0.0–3.0)
Eosinophils Absolute: 0.2 10*3/uL (ref 0.0–0.7)
Eosinophils Relative: 3.3 % (ref 0.0–5.0)
HCT: 48.9 % — ABNORMAL HIGH (ref 36.0–46.0)
Hemoglobin: 15.8 g/dL — ABNORMAL HIGH (ref 12.0–15.0)
Lymphocytes Relative: 11 % — ABNORMAL LOW (ref 12.0–46.0)
Lymphs Abs: 0.7 10*3/uL (ref 0.7–4.0)
MCHC: 32.4 g/dL (ref 30.0–36.0)
MCV: 92.4 fl (ref 78.0–100.0)
Monocytes Absolute: 0.6 10*3/uL (ref 0.1–1.0)
Monocytes Relative: 9.6 % (ref 3.0–12.0)
Neutro Abs: 4.6 10*3/uL (ref 1.4–7.7)
Neutrophils Relative %: 74.9 % (ref 43.0–77.0)
Platelets: 185 10*3/uL (ref 150.0–400.0)
RBC: 5.29 Mil/uL — ABNORMAL HIGH (ref 3.87–5.11)
RDW: 17 % — ABNORMAL HIGH (ref 11.5–15.5)
WBC: 6.2 10*3/uL (ref 4.0–10.5)

## 2022-08-15 LAB — IRON: Iron: 120 ug/dL (ref 42–145)

## 2022-08-15 LAB — VITAMIN B12: Vitamin B-12: 990 pg/mL — ABNORMAL HIGH (ref 211–911)

## 2022-08-15 LAB — FERRITIN: Ferritin: 20.3 ng/mL (ref 10.0–291.0)

## 2022-08-17 ENCOUNTER — Other Ambulatory Visit: Payer: Self-pay | Admitting: Family Medicine

## 2022-08-17 DIAGNOSIS — D649 Anemia, unspecified: Secondary | ICD-10-CM

## 2022-08-19 ENCOUNTER — Ambulatory Visit: Payer: Self-pay

## 2022-08-19 NOTE — Patient Outreach (Signed)
  Care Coordination   Follow Up Visit Note   08/19/2022 Name: Jill Franco MRN: [id number] DOB: 1935/02/03  Jill Franco is a 88 y.o. year old female who sees Joaquim Nam, MD for primary care. I spoke with  [name] by phone today.  What matters to the patients health and wellness today?  Patient states she is doing well.  She reports her weight ranges fro 98 to 101 lbs.  She states she still drinks 2 boost per day and eats ice cream to increase caloric intake.  Patient states swelling in LE has improved. She states she stopped taking the lasix for this reason.  Patient states she ran out of her Creon and didn't refill again.  She states she has not taking Creon for approximately 2 weeks.  Patient states her SOB is managed well with her prescribed inhalers.  Patient states she is not taking her metoprolol.     Goals Addressed             This Visit's Progress    Patient stated goal: "I'd like to focus on my COPD and health conditions"       Interventions Today    Flowsheet Row Most Recent Value  Chronic Disease   Chronic disease during today's visit Chronic Obstructive Pulmonary Disease (COPD), Other  [pancreatic insufficiency]  General Interventions   General Interventions Discussed/Reviewed General Interventions Reviewed, Doctor Visits  [evaluation of current treatment plan for COPD and pancreatic insufficiency and patients adherence to plan as established by provider.  Assessed for COPD symptoms, weight]  Doctor Visits Discussed/Reviewed Doctor Visits Reviewed  Education Interventions   Education Provided Provided Education  [Reviewed COPD symptoms. Advised to call provider for mild to moderate symptoms. Advised to call 911 for severe symptoms.]  Nutrition Interventions   Nutrition Discussed/Reviewed Nutrition Reviewed  [discussed importance of keeping calorie intake up.  Encouraged to continue boost 2x per day and ice cream]  Pharmacy Interventions   Pharmacy  Dicussed/Reviewed Pharmacy Topics Reviewed  [medications reviewed. Discussed importance of taking creon as prescribed. Message sent to PCP informing him patient is not taking creon, metoprolol or lasix. Discussed effectiveness of inhaler use]                 SDOH assessments and interventions completed:  No     Care Coordination Interventions:  Yes, provided   Follow up plan: Follow up call scheduled for 09/16/22    Encounter Outcome:  Pt. Visit Completed   George Ina RN,BSN,CCM [facility] Care Coordination [phone] direct line

## 2022-08-19 NOTE — Patient Instructions (Signed)
Visit Information  Thank you for taking time to visit with me today. Please don't hesitate to contact me if I can be of assistance to you.   Following are the goals we discussed today:   Goals Addressed             This Visit's Progress    Patient stated goal: "I'd like to focus on my COPD and health conditions"       Interventions Today    Flowsheet Row Most Recent Value  Chronic Disease   Chronic disease during today's visit Chronic Obstructive Pulmonary Disease (COPD), Other  [pancreatic insufficiency]  General Interventions   General Interventions Discussed/Reviewed General Interventions Reviewed, Doctor Visits  [evaluation of current treatment plan for COPD and pancreatic insufficiency and patients adherence to plan as established by provider.  Assessed for COPD symptoms, weight]  Doctor Visits Discussed/Reviewed Doctor Visits Reviewed  Education Interventions   Education Provided Provided Education  [Reviewed COPD symptoms. Advised to call provider for mild to moderate symptoms. Advised to call 911 for severe symptoms.]  Nutrition Interventions   Nutrition Discussed/Reviewed Nutrition Reviewed  [discussed importance of keeping calorie intake up.  Encouraged to continue boost 2x per day and ice cream]  Pharmacy Interventions   Pharmacy Dicussed/Reviewed Pharmacy Topics Reviewed  [medications reviewed. Discussed importance of taking creon as prescribed. Message sent to PCP informing him patient is not taking creon, metoprolol or lasix. Discussed effectiveness of inhaler use]                 Our next appointment is by telephone on 09/16/22 at 10:30 am  Please call the care guide team at [phone] if you need to cancel or reschedule your appointment.   If you are experiencing a Mental Health or Behavioral Health Crisis or need someone to talk to, please call the Suicide and Crisis Lifeline: 988 call 1-800-273-TALK (toll free, 24 hour hotline)  The patient verbalized  understanding of instructions, educational materials, and care plan provided today and agreed to receive a mailed copy of patient instructions, educational materials, and care plan.   George Ina RN,BSN,CCM [facility] Care Coordination [phone] direct line

## 2022-08-20 ENCOUNTER — Other Ambulatory Visit: Payer: Self-pay | Admitting: Family Medicine

## 2022-08-21 MED ORDER — PANCRELIPASE (LIP-PROT-AMYL) 36000-114000 UNITS PO CPEP: ORAL_CAPSULE | ORAL | 11 refills | Status: DC

## 2022-08-21 NOTE — Telephone Encounter (Signed)
-----   Message from Otho Ket, RN sent at 08/19/2022 10:27 AM EDT ----- Regarding: patient update Good morning Dr. Para March, I spoke with Ms. Naeve this morning and she told me she ran out of her Creon and didn't refill. She stated she didn't think she needed to take it. I explained to her the importance of taking it as prescribed.  She said her weight is ranging from 98 to 101 lbs.  This is down from when I spoke with her last month.  Her weight range at that time was 101 to 103.  She also said she is no longer taking her metoprolol.  I didn't see in your notes that you advised her not to take it. She states she is not having much LE swelling anymore so she hasn't needed the Lasix.  Her next appointment with you isn't until 09/27/22. She may need a renewed prescription for Creon.   Please advise and let me know how I can assist you further with this.    Have a great day,  George Ina [facility] Care Coordination [phone] direct line

## 2022-08-21 NOTE — Telephone Encounter (Signed)
See below. I sent rx for creon and alendronate.  If her BP is above 140/90, then please let me know.  If below 140/90, then could stay off metoprolol for now.  Thanks.

## 2022-08-22 NOTE — Telephone Encounter (Signed)
Spoke with patient about rxs and BP. Patient verbalized understanding and will call back if needs to.

## 2022-08-22 NOTE — Telephone Encounter (Signed)
LMTCB

## 2022-08-24 ENCOUNTER — Ambulatory Visit (INDEPENDENT_AMBULATORY_CARE_PROVIDER_SITE_OTHER): Payer: Medicare Other

## 2022-08-24 VITALS — Ht 64.0 in | Wt 98.0 lb

## 2022-08-24 DIAGNOSIS — Z0000 Encounter for general adult medical examination without abnormal findings: Secondary | ICD-10-CM

## 2022-08-24 NOTE — Patient Instructions (Signed)
Jill Franco , Thank you for taking time to come for your Medicare Wellness Visit. I appreciate your ongoing commitment to your health goals. Please review the following plan we discussed and let me know if I can assist you in the future.   These are the goals we discussed:  Goals      DIET - EAT MORE FRUITS AND VEGETABLES     Increase physical activity     Starting 08/04/2016, I will continue to walk at least 30-45 min daily.      Increase physical activity     Starting 08/17/2017, I will continue to walk at least 30 minutes daily.      Manage My Medicine     Timeframe:  Long-Range Goal Priority:  Medium Start Date:        05/11/21                     Expected End Date:      05/12/22                 Follow Up Date Sept 2023   - call for medicine refill 2 or 3 days before it runs out - call if I am sick and can't take my medicine - keep a list of all the medicines I take; vitamins and herbals too - use a pillbox to sort medicine    Why is this important?   These steps will help you keep on track with your medicines.   Notes:      Patient stated goal: "I'd like to focus on my COPD and health conditions"     Interventions Today    Flowsheet Row Most Recent Value  Chronic Disease   Chronic disease during today's visit Chronic Obstructive Pulmonary Disease (COPD), Other  [pancreatic insufficiency]  General Interventions   General Interventions Discussed/Reviewed General Interventions Reviewed, Doctor Visits  [evaluation of current treatment plan for COPD and pancreatic insufficiency and patients adherence to plan as established by provider.  Assessed for COPD symptoms, weight]  Doctor Visits Discussed/Reviewed Doctor Visits Reviewed  Education Interventions   Education Provided Provided Education  [Reviewed COPD symptoms. Advised to call provider for mild to moderate symptoms. Advised to call 911 for severe symptoms.]  Nutrition Interventions   Nutrition Discussed/Reviewed  Nutrition Reviewed  [discussed importance of keeping calorie intake up.  Encouraged to continue boost 2x per day and ice cream]  Pharmacy Interventions   Pharmacy Dicussed/Reviewed Pharmacy Topics Reviewed  [medications reviewed. Discussed importance of taking creon as prescribed. Message sent to PCP informing him patient is not taking creon, metoprolol or lasix. Discussed effectiveness of inhaler use]                 This is a list of the screening recommended for you and due dates:  Health Maintenance  Topic Date Due   DTaP/Tdap/Td vaccine (3 - Td or Tdap) 05/30/2020   Flu Shot  09/22/2022   Medicare Annual Wellness Visit  08/24/2023   Pneumonia Vaccine  Completed   DEXA scan (bone density measurement)  Completed   Zoster (Shingles) Vaccine  Completed   HPV Vaccine  Aged Out   COVID-19 Vaccine  Discontinued    Advanced directives: Advance directive discussed with you today. I have provided a copy for you to complete at home and have notarized. Once this is complete please bring a copy in to our office so we can scan it into your chart. Information on Advanced Care Planning  can be found at [facility] of [place] Advance Health Care Directives Advance Health Care Directives ([email or web address]    Conditions/risks identified: Aim for 30 minutes of exercise or brisk walking, 6-8 glasses of water, and 5 servings of fruits and vegetables each day.   Next appointment: Follow up in one year for your annual wellness visit    Preventive Care 65 Years and Older, Female Preventive care refers to lifestyle choices and visits with your health care provider that can promote health and wellness. What does preventive care include? A yearly physical exam. This is also called an annual well check. Dental exams once or twice a year. Routine eye exams. Ask your health care provider how often you should have your eyes checked. Personal lifestyle choices, including: Daily care of your teeth and  gums. Regular physical activity. Eating a healthy diet. Avoiding tobacco and drug use. Limiting alcohol use. Practicing safe sex. Taking low-dose aspirin every day. Taking vitamin and mineral supplements as recommended by your health care provider. What happens during an annual well check? The services and screenings done by your health care provider during your annual well check will depend on your age, overall health, lifestyle risk factors, and family history of disease. Counseling  Your health care provider may ask you questions about your: Alcohol use. Tobacco use. Drug use. Emotional well-being. Home and relationship well-being. Sexual activity. Eating habits. History of falls. Memory and ability to understand (cognition). Work and work Astronomer. Reproductive health. Screening  You may have the following tests or measurements: Height, weight, and BMI. Blood pressure. Lipid and cholesterol levels. These may be checked every 5 years, or more frequently if you are over 30 years old. Skin check. Lung cancer screening. You may have this screening every year starting at age 67 if you have a 30-pack-year history of smoking and currently smoke or have quit within the past 15 years. Fecal occult blood test (FOBT) of the stool. You may have this test every year starting at age 40. Flexible sigmoidoscopy or colonoscopy. You may have a sigmoidoscopy every 5 years or a colonoscopy every 10 years starting at age 27. Hepatitis C blood test. Hepatitis B blood test. Sexually transmitted disease (STD) testing. Diabetes screening. This is done by checking your blood sugar (glucose) after you have not eaten for a while (fasting). You may have this done every 1-3 years. Bone density scan. This is done to screen for osteoporosis. You may have this done starting at age 80. Mammogram. This may be done every 1-2 years. Talk to your health care provider about how often you should have regular  mammograms. Talk with your health care provider about your test results, treatment options, and if necessary, the need for more tests. Vaccines  Your health care provider may recommend certain vaccines, such as: Influenza vaccine. This is recommended every year. Tetanus, diphtheria, and acellular pertussis (Tdap, Td) vaccine. You may need a Td booster every 10 years. Zoster vaccine. You may need this after age 33. Pneumococcal 13-valent conjugate (PCV13) vaccine. One dose is recommended after age 67. Pneumococcal polysaccharide (PPSV23) vaccine. One dose is recommended after age 67. Talk to your health care provider about which screenings and vaccines you need and how often you need them. This information is not intended to replace advice given to you by your health care provider. Make sure you discuss any questions you have with your health care provider. Document Released: 03/06/2015 Document Revised: 10/28/2015 Document Reviewed: 12/09/2014 Elsevier Interactive Patient Education  2017 [employer]  Fall Prevention in the Home Falls can cause injuries. They can happen to people of all ages. There are many things you can do to make your home safe and to help prevent falls. What can I do on the outside of my home? Regularly fix the edges of walkways and driveways and fix any cracks. Remove anything that might make you trip as you walk through a door, such as a raised step or threshold. Trim any bushes or trees on the path to your home. Use bright outdoor lighting. Clear any walking paths of anything that might make someone trip, such as rocks or tools. Regularly check to see if handrails are loose or broken. Make sure that both sides of any steps have handrails. Any raised decks and porches should have guardrails on the edges. Have any leaves, snow, or ice cleared regularly. Use sand or salt on walking paths during winter. Clean up any spills in your garage right away. This includes oil  or grease spills. What can I do in the bathroom? Use night lights. Install grab bars by the toilet and in the tub and shower. Do not use towel bars as grab bars. Use non-skid mats or decals in the tub or shower. If you need to sit down in the shower, use a plastic, non-slip stool. Keep the floor dry. Clean up any water that spills on the floor as soon as it happens. Remove soap buildup in the tub or shower regularly. Attach bath mats securely with double-sided non-slip rug tape. Do not have throw rugs and other things on the floor that can make you trip. What can I do in the bedroom? Use night lights. Make sure that you have a light by your bed that is easy to reach. Do not use any sheets or blankets that are too big for your bed. They should not hang down onto the floor. Have a firm chair that has side arms. You can use this for support while you get dressed. Do not have throw rugs and other things on the floor that can make you trip. What can I do in the kitchen? Clean up any spills right away. Avoid walking on wet floors. Keep items that you use a lot in easy-to-reach places. If you need to reach something above you, use a strong step stool that has a grab bar. Keep electrical cords out of the way. Do not use floor polish or wax that makes floors slippery. If you must use wax, use non-skid floor wax. Do not have throw rugs and other things on the floor that can make you trip. What can I do with my stairs? Do not leave any items on the stairs. Make sure that there are handrails on both sides of the stairs and use them. Fix handrails that are broken or loose. Make sure that handrails are as long as the stairways. Check any carpeting to make sure that it is firmly attached to the stairs. Fix any carpet that is loose or worn. Avoid having throw rugs at the top or bottom of the stairs. If you do have throw rugs, attach them to the floor with carpet tape. Make sure that you have a light  switch at the top of the stairs and the bottom of the stairs. If you do not have them, ask someone to add them for you. What else can I do to help prevent falls? Wear shoes that: Do not have high heels. Have rubber bottoms.  Are comfortable and fit you well. Are closed at the toe. Do not wear sandals. If you use a stepladder: Make sure that it is fully opened. Do not climb a closed stepladder. Make sure that both sides of the stepladder are locked into place. Ask someone to hold it for you, if possible. Clearly mark and make sure that you can see: Any grab bars or handrails. First and last steps. Where the edge of each step is. Use tools that help you move around (mobility aids) if they are needed. These include: Canes. Walkers. Scooters. Crutches. Turn on the lights when you go into a dark area. Replace any light bulbs as soon as they burn out. Set up your furniture so you have a clear path. Avoid moving your furniture around. If any of your floors are uneven, fix them. If there are any pets around you, be aware of where they are. Review your medicines with your doctor. Some medicines can make you feel dizzy. This can increase your chance of falling. Ask your doctor what other things that you can do to help prevent falls. This information is not intended to replace advice given to you by your health care provider. Make sure you discuss any questions you have with your health care provider. Document Released: 12/04/2008 Document Revised: 07/16/2015 Document Reviewed: 03/14/2014 Elsevier Interactive Patient Education  2017 [employer].

## 2022-08-24 NOTE — Progress Notes (Signed)
Subjective:   Jill Franco is a 88 y.o. female who presents for Medicare Annual (Subsequent) preventive examination.  Visit Complete: Virtual  I connected with  Jill Franco on 08/24/22 by a audio enabled telemedicine application and verified that I am speaking with the correct person using two identifiers.  Patient Location: Home  Provider Location: Home Office  I discussed the limitations of evaluation and management by telemedicine. The patient expressed understanding and agreed to proceed.  Patient Medicare AWV questionnaire was completed by the patient on 08/24/2022; I have confirmed that all information answered by patient is correct and no changes since this date.  Review of Systems     Cardiac Risk Factors include: advanced age (>41men, >77 women);hypertension;smoking/ tobacco exposure;dyslipidemia     Objective:    Today's Vitals   08/24/22 1416  Weight: 98 lb (44.5 kg)  Height: 5\' 4"  (1.626 m)   Body mass index is 16.82 kg/m.     08/24/2022    2:20 PM 11/01/2021    7:50 PM 09/28/2021    9:37 AM 04/30/2021    7:32 AM 12/03/2020    6:20 PM 11/25/2020   10:07 AM 06/05/2020    7:43 PM  Advanced Directives  Does Patient Have a Medical Advance Directive? Yes No No Yes Yes No No  Type of Estate agent of [place];Living will   Healthcare Power of Attorney Living will    Does patient want to make changes to medical advance directive?     No - Patient declined    Copy of Healthcare Power of Attorney in Chart? No - copy requested        Would patient like information on creating a medical advance directive?  No - Patient declined No - Patient declined  No - Patient declined No - Patient declined No - Patient declined    Current Medications (verified) Outpatient Encounter Medications as of 08/24/2022  Medication Sig   acetaminophen (TYLENOL) 650 MG CR tablet Take 650 mg by mouth every 8 (eight) hours as needed for pain.   albuterol (VENTOLIN  HFA) 108 (90 Base) MCG/ACT inhaler USE 2 INHALATIONS BY MOUTH  INTO THE LUNGS EVERY 6  HOURS AS NEEDED FOR  WHEEZING OR SHORTNESS OF  BREATH   alendronate (FOSAMAX) 70 MG tablet TAKE 1 TABLET BY MOUTH EVERY 7 DAYS. TAKE WITH A FULL GLASS OF WATER ON AN EMPTY STOMACH   aspirin EC 81 MG tablet Take 1 tablet (81 mg total) by mouth daily. Swallow whole.   budesonide-formoterol (SYMBICORT) 160-4.5 MCG/ACT inhaler Inhale 2 puffs into the lungs in the morning and at bedtime.   Cholecalciferol (VITAMIN D3) 25 MCG (1000 UT) CAPS Take 1 capsule (1,000 Units total) by mouth daily.   cyanocobalamin 500 MCG TABS Take 500 mcg by mouth daily.   diclofenac Sodium (VOLTAREN) 1 % GEL Apply 2 g topically 4 (four) times daily.   furosemide (LASIX) 20 MG tablet TAKE 2 TABLETS BY MOUTH IN THE  MORNING (Patient taking differently: TAKE 2 TABLETS BY MOUTH IN THE  MORNING)   levothyroxine (SYNTHROID) 50 MCG tablet TAKE 1 TABLET BY MOUTH DAILY  EXCEPT 2 TABLETS ON SUNDAYS AND  WEDNESDAYS ( TOTAL 9 TABS PER  WEEK)   lipase/protease/amylase (CREON) 36000 UNITS CPEP capsule Take 2 capsules (72,000 Units total) by mouth 3 (three) times daily with meals. May also take 1 capsule (36,000 Units total) as needed (with snacks).   melatonin 5 MG TABS Take 5 mg by  mouth at bedtime as needed (For sleep).   methocarbamol (ROBAXIN) 500 MG tablet Take 1 tablet (500 mg total) by mouth 2 (two) times daily as needed for muscle spasms.   metoprolol tartrate (LOPRESSOR) 100 MG tablet TAKE ONE-HALF TABLET BY  MOUTH IN THE MORNING AND 1  TABLET BY MOUTH IN THE  EVENING IF BP IS ABOVE 140/90.   rosuvastatin (CRESTOR) 20 MG tablet TAKE 1 TABLET BY MOUTH DAILY   XARELTO 2.5 MG TABS tablet TAKE 1 TABLET BY MOUTH TWICE  DAILY   No facility-administered encounter medications on file as of 08/24/2022.    Allergies (verified) Advil [ibuprofen], Aspirin, Codeine, Lipitor [atorvastatin], and Other   History: Past Medical History:  Diagnosis Date    Cataract    bilaterally corrected.   COPD (chronic obstructive pulmonary disease) (HCC)    CVA (cerebral vascular accident) (HCC)    Pt was unaware and does not know when it happened.  Was told after some testing was done.   Double vision    Dyspnea    Facial droop 10/2017   left   History of shingles 2012   HLD (hyperlipidemia)    HTN (hypertension)    Hypothyroidism    PVD (peripheral vascular disease) (HCC)    Vision loss    temporary   Past Surgical History:  Procedure Laterality Date   ABDOMINAL AORTOGRAM N/A 04/30/2021   Procedure: ABDOMINAL AORTOGRAM;  Surgeon: Leonie Douglas, MD;  Location: [facility];  Service: Cardiovascular;  Laterality: N/A;   ABDOMINAL EXPLORATION SURGERY  1950s   "they thought I was pregnant; went in to explore"   APPENDECTOMY  childhood   BACK SURGERY     BREAST LUMPECTOMY Left 1960's   benign   carotid ultrasound  06/19/2007   0-39% stenosis   CATARACT EXTRACTION W/ INTRAOCULAR LENS IMPLANT Left 09/2016   CATARACT EXTRACTION W/ INTRAOCULAR LENS IMPLANT Right 10/2016   COLONOSCOPY WITH PROPOFOL N/A 04/08/2019   Procedure: COLONOSCOPY WITH PROPOFOL;  Surgeon: Rachael Fee, MD;  Location: [facility];  Service: Endoscopy;  Laterality: N/A;   DILATION AND CURETTAGE OF UTERUS  1960's X 3   miscarriages   DOPPLER ECHOCARDIOGRAPHY  06/19/2007   Normal EF 55-70%   ENDARTERECTOMY FEMORAL Left 12/03/2020   Procedure: LEFT FEMORAL ENDARTERECTOMY AND PROFUNDAPLASTY;  Surgeon: Leonie Douglas, MD;  Location: [facility];  Service: Vascular;  Laterality: Left;   ILIAC ARTERY STENT Left 03/18/2016   common iliac stent (6 x 40), aortic stent (10 x 19)/notes 03/18/2016   INSERTION OF ILIAC STENT Left 12/03/2020   Procedure: AORTIC STENTING, INSERTION OF BILATERAL COMMON ILIAC STENTS, AND LEFT EXTERNAL ILIAC STENT;  Surgeon: Leonie Douglas, MD;  Location: [facility];  Service: Vascular;  Laterality: Left;   LAMINECTOMY AND MICRODISCECTOMY SPINE  1960's X 2    LOWER EXTREMITY ANGIOGRAM N/A 12/03/2020   Procedure: Jill Franco;  Surgeon: Leonie Douglas, MD;  Location: [facility];  Service: Vascular;  Laterality: N/A;   MOLE REMOVAL  07/10/2016   NSVD     x1   PATCH ANGIOPLASTY Left 12/03/2020   Procedure: PATCH ANGIOPLASTY OF LEFT COMMON FEMORAL ARTERY USING BOVINE PERICARDIUM PATCH;  Surgeon: Leonie Douglas, MD;  Location: [facility];  Service: Vascular;  Laterality: Left;   PERIPHERAL VASCULAR BALLOON ANGIOPLASTY Right 04/30/2021   Procedure: PERIPHERAL VASCULAR BALLOON ANGIOPLASTY;  Surgeon: Leonie Douglas, MD;  [facility];  Service: Cardiovascular;  Laterality: Right;  rt common iliac  PERIPHERAL VASCULAR CATHETERIZATION N/A 03/18/2016   Procedure: Abdominal Aortogram w/Lower Extremity;  Surgeon: Sherren Kerns, MD;  Location: [facility] INVASIVE CV LAB;  Service: Cardiovascular;  Laterality: N/A;   PERIPHERAL VASCULAR CATHETERIZATION  03/18/2016   Procedure: Peripheral Vascular Intervention;  Surgeon: Sherren Kerns, MD;  Location: [facility] INVASIVE CV LAB;  Service: Cardiovascular;;   PERIPHERAL VASCULAR INTERVENTION Left 04/30/2021   Procedure: PERIPHERAL VASCULAR INTERVENTION;  Surgeon: Leonie Douglas, MD;  [facility];  Service: Cardiovascular;  Laterality: Left;  lt common iliac   POLYPECTOMY  04/08/2019   Procedure: POLYPECTOMY;  Surgeon: Rachael Fee, MD;  Location: [facility];  Service: Endoscopy;;   ULTRASOUND GUIDANCE FOR VASCULAR ACCESS Right 12/03/2020   Procedure: ULTRASOUND GUIDANCE FOR VASCULAR ACCESS, RIGHT FEMORAL ARTERY;  Surgeon: Leonie Douglas, MD;  Location: [facility];  Service: Vascular;  Laterality: Right;   Family History  Problem Relation Age of Onset   Stroke Father 35   Cancer Brother    Colon cancer Neg Hx    Breast cancer Neg Hx    Colon polyps Neg Hx    Esophageal cancer Neg Hx    Rectal cancer Neg Hx    Stomach cancer Neg Hx    Social History   Socioeconomic History    Marital status: Widowed    Spouse name: Not on file   Number of children: 1   Years of education: Not on file   Highest education level: Not on file  Occupational History   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  Tobacco Use   Smoking status: Former    Packs/day: 0.25    Years: 69.00    Additional pack years: 0.00    Total pack years: 17.25    Types: Cigarettes    Quit date: 01/2021    Years since quitting: 1.5    Passive exposure: Current   Smokeless tobacco: Never  Vaping Use   Vaping Use: Never used  Substance and Sexual Activity   Alcohol use: No    Alcohol/week: 0.0 standard drinks of alcohol   Drug use: No   Sexual activity: Not on file  Other Topics Concern   Not on file  Social History Narrative   From [place]   Retired 2007   Widowed 2008 after 49 years   Enjoys yard work, but needs some help with yard work now.     Her daughter is helping at home some (some as of 2022).  Her daughter has vision loss and is widowed as of 2022 and moved in with patient.   Social Determinants of Health   Financial Resource Strain: Low Risk  (08/24/2022)   Overall Financial Resource Strain (CARDIA)    Difficulty of Paying Living Expenses: Not hard at all  Food Insecurity: No Food Insecurity (08/24/2022)   Hunger Vital Sign    Worried About Running Out of Food in the Last Year: Never true    Ran Out of Food in the Last Year: Never true  Transportation Needs: No Transportation Needs (08/24/2022)   PRAPARE - Administrator, Civil Service (Medical): No    Lack of Transportation (Non-Medical): No  Physical Activity: Insufficiently Active (08/24/2022)   Exercise Vital Sign    Days of Exercise per Week: 3 days    Minutes of Exercise per Session: 30 min  Stress: No Stress Concern Present (08/24/2022)   [employer] of Occupational Health - Occupational Stress Questionnaire    Feeling  of Stress : Not at all  Social Connections: Moderately Isolated  (08/24/2022)   Social Connection and Isolation Panel [NHANES]    Frequency of Communication with Friends and Family: More than three times a week    Frequency of Social Gatherings with Friends and Family: More than three times a week    Attends Religious Services: More than 4 times per year    Active Member of [employer] or Organizations: No    Attends Banker Meetings: Never    Marital Status: Widowed    Tobacco Counseling Counseling given: Not Answered   Clinical Intake:  Pre-visit preparation completed: Yes  Pain : No/denies pain     Nutritional Risks: None Diabetes: No  How often do you need to have someone help you when you read instructions, pamphlets, or other written materials from your doctor or pharmacy?: 1 - Never  Interpreter Needed?: No  Information entered by :: Renie Ora, LPN   Activities of Daily Living    08/24/2022    2:20 PM 11/02/2021    3:00 AM  In your present state of health, do you have any difficulty performing the following activities:  Hearing? 0 0  Vision? 0 0  Difficulty concentrating or making decisions? 0 0  Walking or climbing stairs? 0 1  Comment  due to pain in BLE  Dressing or bathing? 0 0  Doing errands, shopping? 0 0  Preparing Food and eating ? N   Using the Toilet? N   In the past six months, have you accidently leaked urine? N   Do you have problems with loss of bowel control? N   Managing your Medications? N   Managing your Finances? N   Housekeeping or managing your Housekeeping? N     Patient Care Team: Joaquim Nam, MD as PCP - General (Family Medicine) Blima Ledger, OD as Referring Physician (Optometry) [place], Garry Heater, [place] (Inactive) as Pharmacist (Pharmacist) Otho Ket, RN as [facility] Care Management  Indicate any recent Medical Services you may have received from other than [facility] providers in the past year (date may be approximate).     Assessment:   This is a routine  wellness examination for Jady.  Hearing/Vision screen Vision Screening - Comments:: Wears rx glasses - up to date with routine eye exams with  Dr.Miller   Dietary issues and exercise activities discussed:     Goals Addressed             This Visit's Progress    DIET - EAT MORE FRUITS AND VEGETABLES   On track      Depression Screen    08/24/2022    2:19 PM 05/23/2022    9:33 AM 09/28/2021    9:35 AM 09/18/2020    9:11 AM 09/12/2019   10:09 AM 08/17/2017    8:54 AM 08/04/2016    1:40 PM  PHQ 2/9 Scores  PHQ - 2 Score 0 0 0 0 0 0 0  PHQ- 9 Score  1 0 0  0     Fall Risk    08/24/2022    2:17 PM 05/23/2022    9:33 AM 09/28/2021    9:38 AM 09/18/2020    8:42 AM 09/12/2019   10:09 AM  Fall Risk   Falls in the past year? 0 0 0 0 0  Number falls in past yr: 0 1 0 0   Injury with Fall? 0 0 0 0   Risk for fall  due to : No Fall Risks History of fall(s) No Fall Risks No Fall Risks   Follow up Falls prevention discussed Falls evaluation completed Falls evaluation completed Falls evaluation completed     MEDICARE RISK AT HOME:  Medicare Risk at Home - 08/24/22 1417     Any stairs in or around the home? No    If so, are there any without handrails? No    Home free of loose throw rugs in walkways, pet beds, electrical cords, etc? Yes    Adequate lighting in your home to reduce risk of falls? Yes    Life alert? No    Use of a cane, walker or w/c? No    Grab bars in the bathroom? Yes    Shower chair or bench in shower? Yes    Elevated toilet seat or a handicapped toilet? Yes             TIMED UP AND GO:  Was the test performed?  No    Cognitive Function:    08/17/2017    8:55 AM 08/04/2016    1:40 PM 07/27/2015    1:25 PM  MMSE - Mini Mental State Exam  Orientation to time 5 5 5   Orientation to Place 5 5 5   Registration 3 3 3   Attention/ Calculation 0 0 0  Recall 3 3 3   Language- name 2 objects 0 0 0  Language- repeat 1 1 1   Language- follow 3 step command 3 3 3    Language- read & follow direction 0 0 0  Write a sentence 0 0 0  Copy design 0 0 0  Total score 20 20 20         08/24/2022    2:20 PM  6CIT Screen  What Year? 0 points  What month? 0 points  What time? 0 points  Count back from 20 0 points  Months in reverse 0 points  Repeat phrase 0 points  Total Score 0 points    Immunizations Immunization History  Administered Date(s) Administered   Fluad Quad(high Dose 65+) 11/23/2018, 11/02/2019, 10/22/2020, 12/01/2021   Influenza Split 12/06/2010   Influenza Whole 11/21/2004, 11/30/2006, 12/02/2008, 11/27/2009   Influenza,inj,Quad PF,6+ Mos 12/11/2012, 11/12/2013, 01/29/2015, 11/27/2015, 12/22/2016, 11/21/2017   PFIZER(Purple Top)SARS-COV-2 Vaccination 04/21/2019, 05/21/2019, 02/07/2020   Pneumococcal Conjugate-13 08/04/2014   Pneumococcal Polysaccharide-23 05/23/2006   Td 05/22/2004   Tdap 05/31/2010   Zoster Recombinant(Shingrix) 02/22/2019, 04/22/2019   Zoster, Live 06/11/2010    TDAP status: Due, Education has been provided regarding the importance of this vaccine. Advised may receive this vaccine at local pharmacy or Health Dept. Aware to provide a copy of the vaccination record if obtained from local pharmacy or Health Dept. Verbalized acceptance and understanding.  Flu Vaccine status: Up to date  Pneumococcal vaccine status: Up to date  Covid-19 vaccine status: Completed vaccines  Qualifies for Shingles Vaccine? Yes   Zostavax completed Yes   Shingrix Completed?: Yes  Screening Tests Health Maintenance  Topic Date Due   DTaP/Tdap/Td (3 - Td or Tdap) 05/30/2020   INFLUENZA VACCINE  09/22/2022   Medicare Annual Wellness (AWV)  08/24/2023   Pneumonia Vaccine 60+ Years old  Completed   DEXA SCAN  Completed   Zoster Vaccines- Shingrix  Completed   HPV VACCINES  Aged Out   COVID-19 Vaccine  Discontinued    Health Maintenance  Health Maintenance Due  Topic Date Due   DTaP/Tdap/Td (3 - Td or Tdap) 05/30/2020     Colorectal cancer  screening: No longer required.   Mammogram status: No longer required due to age .  Bone Density status: Completed 10/08/2020. Results reflect: Bone density results: OSTEOPOROSIS. Repeat every 2 years.  Lung Cancer Screening: (Low Dose CT Chest recommended if Age 5-80 years, 20 pack-year currently smoking OR have quit w/in 15years.) does qualify.   Lung Cancer Screening Referral: declined   Additional Screening:  Hepatitis C Screening: does not qualify;   Vision Screening: Recommended annual ophthalmology exams for early detection of glaucoma and other disorders of the eye. Is the patient up to date with their annual eye exam?  Yes  Who is the provider or what is the name of the office in which the patient attends annual eye exams? Dr.Miller  If pt is not established with a provider, would they like to be referred to a provider to establish care? No .   Dental Screening: Recommended annual dental exams for proper oral hygiene    Community Resource Referral / Chronic Care Management: CRR required this visit?  No   CCM required this visit?  No     Plan:     I have personally reviewed and noted the following in the patient's chart:   Medical and social history Use of alcohol, tobacco or illicit drugs  Current medications and supplements including opioid prescriptions. Patient is not currently taking opioid prescriptions. Functional ability and status Nutritional status Physical activity Advanced directives List of other physicians Hospitalizations, surgeries, and ER visits in previous 12 months Vitals Screenings to include cognitive, depression, and falls Referrals and appointments  In addition, I have reviewed and discussed with patient certain preventive protocols, quality metrics, and best practice recommendations. A written personalized care plan for preventive services as well as general preventive health recommendations were provided to  patient.     Lorrene Reid, LPN   4/0/9811   After Visit Summary: (MyChart) Due to this being a telephonic visit, the after visit summary with patients personalized plan was offered to patient via MyChart   Nurse Notes: none

## 2022-09-06 ENCOUNTER — Encounter: Payer: Self-pay | Admitting: Pharmacist

## 2022-09-06 NOTE — Progress Notes (Signed)
Patient previously followed by [facility] pharmacist. Per clinical review, no pharmacist appointment needed at this time. Will make pharmacy patient advocate team aware of medication assistance enrollment for Creon and Symbicort for re-enrollment in 2025. Care guide directed to contact patient and cancel appointment and notify pharmacy team of any patient concerns.

## 2022-09-07 ENCOUNTER — Encounter: Payer: Medicare Other | Admitting: Pharmacist

## 2022-09-16 ENCOUNTER — Ambulatory Visit: Payer: Self-pay

## 2022-09-16 NOTE — Patient Outreach (Signed)
  Care Coordination   Follow Up Visit Note   09/16/2022 Name: Jill Franco MRN: [id number] DOB: 16-Sep-1934  Jill Franco is a 88 y.o. year old female who sees Joaquim Nam, MD for primary care. I spoke with  [name] by phone today.  What matters to the patients health and wellness today?  Patient state she thinks she is doing pretty good. Denies increase is SOB. She states SOB is about the same and manageable.  Patient reports today's weight is 97.6.  She reports her weight range has been 97.6 to 100 lbs.  Patient denies any swelling in LE.   Patient states she is still out of Creon.  Per chart review provider  and practice pharmacist has been notified. Next follow up with PCP on 09/27/22.    Goals Addressed             This Visit's Progress    Patient stated goal: "I'd like to focus on my COPD and health conditions"       Interventions Today    Flowsheet Row Most Recent Value  Chronic Disease   Chronic disease during today's visit Chronic Obstructive Pulmonary Disease (COPD), Other  [pancreatic insufficiency]  General Interventions   General Interventions Discussed/Reviewed General Interventions Reviewed, Doctor Visits  [evaluation of current treatment plan for COPD, pancreatic insufficiency and patients adherence to plan as established by provider. Assessed for COPD symptoms, weight, digestive concerns.]  Doctor Visits Discussed/Reviewed Doctor Visits Reviewed  [reviewed scheduled / upcoming provider visits.]  Education Interventions   Education Provided Provided Education  [reviewed COPD symptoms. Advised to notify provider for increase in COPD symptoms or digestive issues.  Advised to call 911 for severe COPD symptoms.]  Nutrition Interventions   Nutrition Discussed/Reviewed Nutrition Discussed  [assessed patients appetite. Confirmed patient still drinking nutritional supplements.]  Pharmacy Interventions   Pharmacy Dicussed/Reviewed Pharmacy Topics Reviewed   [facility] pharmacy note regarding patients Creon.  Reviewed medications and compliance discussed.  Advised patient to let provider know she has 1 symbicort left at follow up visit on 09/27/22.  Advised to confirm she has refills.]                 SDOH assessments and interventions completed:  No     Care Coordination Interventions:  Yes, provided   Follow up plan: Follow up call scheduled for 10/11/22    Encounter Outcome:  Pt. Visit Completed   George Ina RN,BSN,CCM [facility] Care Coordination [phone] direct line

## 2022-09-16 NOTE — Patient Instructions (Signed)
Visit Information  Thank you for taking time to visit with me today. Please don't hesitate to contact me if I can be of assistance to you.   Following are the goals we discussed today:   Goals Addressed             This Visit's Progress    Patient stated goal: "I'd like to focus on my COPD and health conditions"       Interventions Today    Flowsheet Row Most Recent Value  Chronic Disease   Chronic disease during today's visit Chronic Obstructive Pulmonary Disease (COPD), Other  [pancreatic insufficiency]  General Interventions   General Interventions Discussed/Reviewed General Interventions Reviewed, Doctor Visits  [evaluation of current treatment plan for COPD, pancreatic insufficiency and patients adherence to plan as established by provider. Assessed for COPD symptoms, weight, digestive concerns.]  Doctor Visits Discussed/Reviewed Doctor Visits Reviewed  [reviewed scheduled / upcoming provider visits.]  Education Interventions   Education Provided Provided Education  [reviewed COPD symptoms. Advised to notify provider for increase in COPD symptoms or digestive issues.  Advised to call 911 for severe COPD symptoms.]  Nutrition Interventions   Nutrition Discussed/Reviewed Nutrition Discussed  [assessed patients appetite. Confirmed patient still drinking nutritional supplements.]  Pharmacy Interventions   Pharmacy Dicussed/Reviewed Pharmacy Topics Reviewed  [facility] pharmacy note regarding patients Creon.  Reviewed medications and compliance discussed.  Advised patient to let provider know she has 1 symbicort left at follow up visit on 09/27/22.  Advised to confirm she has refills.]                 Our next appointment is by telephone on 10/11/22 at 10:30 am  Please call the care guide team at [phone] if you need to cancel or reschedule your appointment.   If you are experiencing a Mental Health or Behavioral Health Crisis or need someone to talk to, please call the  Suicide and Crisis Lifeline: 988 call 1-800-273-TALK (toll free, 24 hour hotline)  The patient verbalized understanding of instructions, educational materials, and care plan provided today and agreed to receive a mailed copy of patient instructions, educational materials, and care plan.   George Ina RN,BSN,CCM [facility] Care Coordination [phone] direct line

## 2022-09-27 ENCOUNTER — Ambulatory Visit (INDEPENDENT_AMBULATORY_CARE_PROVIDER_SITE_OTHER): Payer: Medicare Other | Admitting: Family Medicine

## 2022-09-27 ENCOUNTER — Encounter: Payer: Self-pay | Admitting: Family Medicine

## 2022-09-27 VITALS — BP 122/78 | HR 80 | Temp 97.8°F | Ht 64.0 in | Wt 99.2 lb

## 2022-09-27 DIAGNOSIS — J449 Chronic obstructive pulmonary disease, unspecified: Secondary | ICD-10-CM | POA: Diagnosis not present

## 2022-09-27 DIAGNOSIS — M810 Age-related osteoporosis without current pathological fracture: Secondary | ICD-10-CM | POA: Diagnosis not present

## 2022-09-27 DIAGNOSIS — D649 Anemia, unspecified: Secondary | ICD-10-CM

## 2022-09-27 DIAGNOSIS — I739 Peripheral vascular disease, unspecified: Secondary | ICD-10-CM | POA: Diagnosis not present

## 2022-09-27 DIAGNOSIS — Z0000 Encounter for general adult medical examination without abnormal findings: Secondary | ICD-10-CM

## 2022-09-27 DIAGNOSIS — E785 Hyperlipidemia, unspecified: Secondary | ICD-10-CM | POA: Diagnosis not present

## 2022-09-27 DIAGNOSIS — I10 Essential (primary) hypertension: Secondary | ICD-10-CM

## 2022-09-27 DIAGNOSIS — Z7189 Other specified counseling: Secondary | ICD-10-CM

## 2022-09-27 DIAGNOSIS — K8689 Other specified diseases of pancreas: Secondary | ICD-10-CM

## 2022-09-27 NOTE — Patient Instructions (Addendum)
If you have belly pain, greasy stools, or weight loss then let me know.    You can call for a mammogram at the [facility] [address] [place] [address] [place]  I would try to limit salt.    Recheck fasting labs this fall.   I'll check on med help/options.

## 2022-09-27 NOTE — Progress Notes (Unsigned)
Osteoporosis.  Taking fosamax.  No ADE on med. Cautions d/w pt.    Pancreatic insufficiency.  Out of creon.  She felt better off med.  She agreed to monitor her weight and update me as needed.  Weight usually ~99 lbs.  No greasy stools or Gi sx.    Elevated Cholesterol: Using medications without problems: yes Muscle aches: no Diet compliance: d/w pt.  Exercise: d/w pt.    H/o anemia, now off iron with f/u labs pending for the fall of 2024.    Anticoagulation d/w pt.  No bleeding.  PAD noted and walking distance is same as prior.  No skin breakdown.  I told her I would check with pharmacy staff about med assistance with xarelto  She had more BLE edema with inc salt intake, d/w pt.    COPD clearly improved with symbicort with much less SABA use/need.  She rinses after symbicort use.  I told her I would check with pharmacy staff about med assistance with symbicort.    Mammogram d/w pt.   DXA deferred 2024.  Has been on fosamax for 1 year as of 2024  Living will d/w pt.  Daughter Liborio Nixon designated if patient were incapacitated.   Colonoscopy deferred given her age.  Not blood seen in stool.   covid 2021 Flu due this fall.   PNA up to date Tetanus 2012 Shingles d/w pt.    Meds, vitals, and allergies reviewed.   ROS: Per HPI unless specifically indicated in ROS section   GEN: nad, alert and oriented HEENT: NCAT NECK: supple w/o LA CV: rrr.  PULM: ctab, no inc wob ABD: soft, +bs EXT: Trace BLE edema.  SKIN: no acute rash

## 2022-09-28 DIAGNOSIS — E785 Hyperlipidemia, unspecified: Secondary | ICD-10-CM | POA: Insufficient documentation

## 2022-09-28 NOTE — Assessment & Plan Note (Signed)
Out of creon.  She felt better off med.  She agreed to monitor her weight and update me as needed.  Weight usually ~99 lbs.  No greasy stools or Gi sx.

## 2022-09-28 NOTE — Assessment & Plan Note (Signed)
Mammogram d/w pt.   DXA deferred 2024.  Has been on fosamax for 1 year as of 2024  Living will d/w pt.  Daughter Liborio Nixon designated if patient were incapacitated.   Colonoscopy deferred given her age.  Not blood seen in stool.   covid 2021 Flu due this fall.   PNA up to date Tetanus 2012 Shingles d/w pt.

## 2022-09-28 NOTE — Assessment & Plan Note (Signed)
Continue Xarelto metoprolol rosuvastatin.

## 2022-09-28 NOTE — Assessment & Plan Note (Signed)
Continue Symbicort, rinse after use.  Continue albuterol as needed.

## 2022-09-28 NOTE — Assessment & Plan Note (Signed)
History of, now off iron with f/u labs pending for the fall of 2024.

## 2022-09-28 NOTE — Assessment & Plan Note (Signed)
Continue Crestor 

## 2022-09-28 NOTE — Assessment & Plan Note (Signed)
Taking fosamax.  No ADE on med. Cautions d/w pt.   Would continue as is.

## 2022-09-28 NOTE — Addendum Note (Signed)
Addended by: Joaquim Nam on: 09/28/2022 09:33 PM   Modules accepted: Orders

## 2022-09-28 NOTE — Assessment & Plan Note (Signed)
Living will d/w pt.Daughter Thayer Headings designated if patient were incapacitated.

## 2022-10-05 ENCOUNTER — Telehealth: Payer: Self-pay | Admitting: Pharmacist

## 2022-10-05 NOTE — Progress Notes (Unsigned)
Contacted patient regarding referral for medication access from Joaquim Nam, MD .   Left patient a voicemail to return my call at their convenience  Catie TClearance Coots, PharmD, BCACP, CPP Clinical Pharmacist [facility] [phone]

## 2022-10-06 ENCOUNTER — Other Ambulatory Visit: Payer: Medicare Other | Admitting: Pharmacist

## 2022-10-06 DIAGNOSIS — J449 Chronic obstructive pulmonary disease, unspecified: Secondary | ICD-10-CM

## 2022-10-06 MED ORDER — BUDESONIDE-FORMOTEROL FUMARATE 160-4.5 MCG/ACT IN AERO
2.0000 | INHALATION_SPRAY | Freq: Two times a day (BID) | RESPIRATORY_TRACT | 3 refills | Status: DC
Start: 2022-10-06 — End: 2022-10-07

## 2022-10-06 NOTE — Progress Notes (Signed)
10/06/2022 Name: Jill Franco MRN: [id number] DOB: [birth year]  Chief Complaint  Patient presents with   Medication Management    Jill Franco is a 88 y.o. year old female who presented for a telephone visit.   They were referred to the pharmacist by their PCP for assistance in managing medication access.    Subjective:  Care Team: Primary Care Provider: Joaquim Nam, MD ; Next Scheduled Visit: not scheduled  Medication Access/Adherence  Current Pharmacy:  [facility] - [place], [place] - [address] [place] [address] Phone: [phone] Fax: [phone]  [facility] [address] [place]), [place] - [address] [place]) [place] [address] Phone: [phone] Fax: [phone]   Patient reports affordability concerns with their medications: Yes  - Xarelto and Symbicort  Patient reports access/transportation concerns to their pharmacy: No  Patient reports adherence concerns with their medications:  No    She is unsure if she is in the medicare coverage gap  COPD:  Current medications: Symbicort 160/4.5 mcg 2 puffs twice daily   Current medication access support: previously received from [facility] and Me prescription assistance. Unfortunately, Symbicort is no longer available on the program as it is generic. No PAP options for LABA/ICS   PAD: Current medications: rosuvastatin 20 mg daily, aspirin 81 mg daily, Xarelto 2.5 mg twice daily  Previously used Xarelto With Me $89 program for Xarelto. Has ~ 6 days remaining  Objective:  Lab Results  Component Value Date   HGBA1C 5.7 09/18/2020    Lab Results  Component Value Date   CREATININE 0.83 05/12/2022   BUN 21 05/12/2022   NA 140 05/12/2022   K 4.0 05/12/2022   CL 104 05/12/2022   CO2 28 05/12/2022    Lab Results  Component Value Date   CHOL 114 12/04/2020   HDL 71 12/04/2020   LDLCALC 36 12/04/2020   LDLDIRECT  115.8 06/19/2012   TRIG 37 12/04/2020   CHOLHDL 1.6 12/04/2020    Medications Reviewed Today     Reviewed by Alden Hipp, RPH-CPP (Pharmacist) on 10/06/22 at [phone]  Med List Status: <None>   Medication Order Taking? Sig Documenting Provider Last Dose Status Informant  acetaminophen (TYLENOL) 650 MG CR tablet [id number]  Take 650 mg by mouth every 8 (eight) hours as needed for pain. [provider]  Active Self  albuterol (VENTOLIN HFA) 108 (90 Base) MCG/ACT inhaler [id number]  USE 2 INHALATIONS BY MOUTH  INTO THE LUNGS EVERY 6  HOURS AS NEEDED FOR  WHEEZING OR SHORTNESS OF  BREATH Joaquim Nam, MD  Active Self  alendronate (FOSAMAX) 70 MG tablet [id number] Yes TAKE 1 TABLET BY MOUTH EVERY 7 DAYS. TAKE WITH A FULL GLASS OF WATER ON AN EMPTY STOMACH Joaquim Nam, MD Taking Active   aspirin EC 81 MG tablet [id number] Yes Take 1 tablet (81 mg total) by mouth daily. Swallow whole. Cantwell, Celeste C, PA-C Taking Active Self  budesonide-formoterol (SYMBICORT) 160-4.5 MCG/ACT inhaler [id number] Yes Inhale 2 puffs into the lungs in the morning and at bedtime. Joaquim Nam, MD Taking Active Self  Cholecalciferol (VITAMIN D3) 25 MCG (1000 UT) CAPS [id number] Yes Take 1 capsule (1,000 Units total) by mouth daily. Joaquim Nam, MD Taking Active Self  cyanocobalamin 500 MCG TABS [id number] Yes Take 500 mcg by mouth daily. Joaquim Nam, MD Taking Active   diclofenac Sodium (VOLTAREN) 1 %  GEL [id number] Yes Apply 2 g topically 4 (four) times daily. Joaquim Nam, MD Taking Active   furosemide (LASIX) 20 MG tablet [id number] Yes TAKE 2 TABLETS BY MOUTH IN THE  MORNING  Patient taking differently: TAKE 2 TABLETS BY MOUTH IN THE  MORNING   Joaquim Nam, MD Taking Active   levothyroxine (SYNTHROID) 50 MCG tablet [id number] Yes TAKE 1 TABLET BY MOUTH DAILY  EXCEPT 2 TABLETS ON SUNDAYS AND  WEDNESDAYS ( TOTAL 9 TABS PER  WEEK) Joaquim Nam, MD Taking Active   melatonin 5 MG  TABS [id number]  Take 5 mg by mouth at bedtime as needed (For sleep). [provider]  Active Self  methocarbamol (ROBAXIN) 500 MG tablet [id number]  Take 1 tablet (500 mg total) by mouth 2 (two) times daily as needed for muscle spasms. Joaquim Nam, MD  Active   metoprolol tartrate (LOPRESSOR) 100 MG tablet [id number] Yes TAKE ONE-HALF TABLET BY  MOUTH IN THE MORNING AND 1  TABLET BY MOUTH IN THE  EVENING IF BP IS ABOVE 140/90. Joaquim Nam, MD Taking Active Self           Med Note Janelle Floor May 23, 2022  9:45 AM)    rosuvastatin (CRESTOR) 20 MG tablet [id number] Yes TAKE 1 TABLET BY MOUTH DAILY Joaquim Nam, MD Taking Active   XARELTO 2.5 MG TABS tablet [id number] Yes TAKE 1 TABLET BY MOUTH TWICE  DAILY Joaquim Nam, MD Taking Active               Assessment/Plan:   COPD: - Currently medication access concerns. - Discussed income. Patient may qualify for Medicare Extra Help. Assisted patient in completing application today. Will collaborate with PCP to refill Symbicort to patient's pharmacy to see copay (per formulary is Tier 3, but no Tier 2 inhaler options). Symbicort is apparently not covered, but Advair HFA is covered and patient can afford $47/month copay. Discussed with PCP, order placed.  - If approved for Medicare Extra Help, copay will reduce to $11.20 per 90 day supply. If denied, could consider applying for assistance for LABA/LAMA therapy (I.e. Stiolto/Anoro) vs triple therapy (Trelegy/Breztri).   PAD: - Appropriately managed - - Discussed income. Patient may qualify for Medicare Extra Help. Assisted patient in completing application today. In the meantime, she will refill a 30 day supply of Xarelto from [facility], . - If denied for Medicare Extra Help, will support re-enrolling in Xarelto With Me program for $89/30 day supply from Xarel[facility]  Follow Up Plan: patient will contact me when she receives information from Extra Help  Application  Catie TClearance Coots, PharmD, BCACP, CPP Clinical Pharmacist [facility] [phone]

## 2022-10-07 MED ORDER — FLUTICASONE-SALMETEROL 115-21 MCG/ACT IN AERO: 2.0000 | INHALATION_SPRAY | Freq: Two times a day (BID) | RESPIRATORY_TRACT | 3 refills | Status: DC

## 2022-10-10 ENCOUNTER — Telehealth: Payer: Self-pay

## 2022-10-10 NOTE — Patient Outreach (Signed)
  Care Coordination   Follow Up Visit Note   10/10/2022 Name: Jill Franco MRN: [id number] DOB: 09-Oct-1934  Jill Franco is a 88 y.o. year old female who sees Joaquim Nam, MD for primary care. I spoke with  [name] by phone today.  What matters to the patients health and wellness today?   COPD:  Patient states she is doing very well.  She reports the Symbicort is really helping with her breathing.  She reports her activity level is doing things inside of the house and taking breaks as needed.  Denies any ongoing COPD symptoms. Patient states her Symbicort refill cost her $131 and reports her blood thinner was $262.  She states she is working with the Government social research officer on medication assistance.  Pancreatic insufficiency:   Patient reports seeing her primary care provider on 09/27/22. She states he is aware she is not on the Creon at this time.  Per chart review primary provider advised patient to monitor her weight and update him as needed. Patient states she is aware.     Goals Addressed             This Visit's Progress    Patient stated goal: "I'd like to focus on my COPD and health conditions"       Interventions Today    Flowsheet Row Most Recent Value  Chronic Disease   Chronic disease during today's visit Chronic Obstructive Pulmonary Disease (COPD), Other  [pancreatic insufficiency]  General Interventions   General Interventions Discussed/Reviewed General Interventions Reviewed, Doctor Visits  [evaluation of current treatment plan for COPD/ pancreatic insufficiency and patients adherence to plan as established by provider.  Assessed for COPD symptoms, weight loss, new or ongoing symptoms.]  Doctor Visits Discussed/Reviewed Doctor Visits Reviewed  [reviewed upcoming provider visits. Advised to keep follow up appointments as recommended.]  Exercise Interventions   Exercise Discussed/Reviewed Physical Activity  [advised to remain as active as tolerated.  Advised  to move around frequently during the day, not sitting for long periods of time.  Discussed checking for air quality before going outside to avoid COPD triggers.]  Nutrition Interventions   Nutrition Discussed/Reviewed Nutrition Reviewed  [Advised to weigh frequently and report increase weight loss to primary care provider due to not taking Creon at this time.]  Pharmacy Interventions   Pharmacy Dicussed/Reviewed Pharmacy Topics Reviewed  [medications reviewed.  Advised to take medications as prescribed. Encouraged patient to continue to work with practice pharmacist regarding medication assistance.]                 SDOH assessments and interventions completed:  No     Care Coordination Interventions:  Yes, provided   Follow up plan: Follow up call scheduled for 11/29/22    Encounter Outcome:  Pt. Visit Completed   George Ina RN,BSN,CCM [facility] Care Coordination [phone] direct line

## 2022-10-10 NOTE — Patient Instructions (Signed)
Visit Information  Thank you for taking time to visit with me today. Please don't hesitate to contact me if I can be of assistance to you.   Following are the goals we discussed today:   Goals Addressed             This Visit's Progress    Patient stated goal: "I'd like to focus on my COPD and health conditions"       Interventions Today    Flowsheet Row Most Recent Value  Chronic Disease   Chronic disease during today's visit Chronic Obstructive Pulmonary Disease (COPD), Other  [pancreatic insufficiency]  General Interventions   General Interventions Discussed/Reviewed General Interventions Reviewed, Doctor Visits  [evaluation of current treatment plan for COPD/ pancreatic insufficiency and patients adherence to plan as established by provider.  Assessed for COPD symptoms, weight loss, new or ongoing symptoms.]  Doctor Visits Discussed/Reviewed Doctor Visits Reviewed  [reviewed upcoming provider visits. Advised to keep follow up appointments as recommended.]  Exercise Interventions   Exercise Discussed/Reviewed Physical Activity  [advised to remain as active as tolerated.  Advised to move around frequently during the day, not sitting for long periods of time.  Discussed checking for air quality before going outside to avoid COPD triggers.]  Nutrition Interventions   Nutrition Discussed/Reviewed Nutrition Reviewed  [Advised to weigh frequently and report increase weight loss to primary care provider due to not taking Creon at this time.]  Pharmacy Interventions   Pharmacy Dicussed/Reviewed Pharmacy Topics Reviewed  [medications reviewed.  Advised to take medications as prescribed. Encouraged patient to continue to work with practice pharmacist regarding medication assistance.]                 Our next appointment is by telephone on 11/29/22 at 10 am  Please call the care guide team at [phone] if you need to cancel or reschedule your appointment.   If you are experiencing  a Mental Health or Behavioral Health Crisis or need someone to talk to, please call the Suicide and Crisis Lifeline: 988 call 1-800-273-TALK (toll free, 24 hour hotline)  The patient verbalized understanding of instructions, educational materials, and care plan provided today and agreed to receive a mailed copy of patient instructions, educational materials, and care plan.   George Ina RN,BSN,CCM [facility] Care Coordination [phone] direct line

## 2022-10-17 ENCOUNTER — Telehealth: Payer: Self-pay | Admitting: Family Medicine

## 2022-10-17 DIAGNOSIS — E039 Hypothyroidism, unspecified: Secondary | ICD-10-CM

## 2022-10-17 NOTE — Telephone Encounter (Signed)
Please give the okay.  Would recheck TSH with next set of labs.  I put in the order.  Thanks.

## 2022-10-17 NOTE — Telephone Encounter (Signed)
Shunrekia from [facility] called in and stated that there is a change in manufacture for her levothyroxine (SYNTHROID) 50 MCG tablet. They are needing verification to change the manufacture. Reference number is [id number]. Thank you!

## 2022-10-17 NOTE — Telephone Encounter (Signed)
Pharmacy notified that its okay to change manufacture.

## 2022-10-17 NOTE — Telephone Encounter (Signed)
Are you okay with them changing manufactures?

## 2022-10-25 ENCOUNTER — Ambulatory Visit: Payer: Medicare Other

## 2022-10-26 ENCOUNTER — Ambulatory Visit (INDEPENDENT_AMBULATORY_CARE_PROVIDER_SITE_OTHER): Payer: Medicare Other | Admitting: Pharmacist

## 2022-10-26 DIAGNOSIS — J449 Chronic obstructive pulmonary disease, unspecified: Secondary | ICD-10-CM | POA: Diagnosis not present

## 2022-10-26 DIAGNOSIS — F17200 Nicotine dependence, unspecified, uncomplicated: Secondary | ICD-10-CM | POA: Diagnosis not present

## 2022-10-26 DIAGNOSIS — IMO0001 Reserved for inherently not codable concepts without codable children: Secondary | ICD-10-CM

## 2022-10-26 NOTE — Progress Notes (Unsigned)
10/27/2022 Name: Jill Franco MRN: [id number] DOB: [birth year]  Chief Complaint  Patient presents with   Medication Management   Diabetes    Jill Franco is a 88 y.o. year old female who was referred for medication management by their primary care provider, Joaquim Nam, MD. They presented for a face to face visit today.   They were referred to the pharmacist by their PCP for assistance in managing medication access    Subjective:  Care Team: Primary Care Provider: Joaquim Nam, MD ; Next Scheduled Visit: not scheduled, but has labs later this month  Medication Access/Adherence  Current Pharmacy:  [facility] [place], [place] - [address] [place] [address] Phone: [phone] Fax: [phone]  [facility] [address] [place]), [place] - [address] [place]) [place] [address] Phone: [phone] Fax: [phone]   Patient reports affordability concerns with their medications: Yes  Patient reports access/transportation concerns to their pharmacy: No  Patient reports adherence concerns with their medications:  No     COPD:  Current medications: was prescribed Symbicort, but no longer on formulary, completing a current supply, but will switch to Advair HFA 115/21 twice daily; albuterol HFA PRN - using 1-2 times daily   Copays for Advair and Symbicort are ~ $113 per 30 day supply. Brings letter from [facility] Extra Help where she was approved.   Tobacco Abuse:  Tobacco Use History: Number of cigarettes per day ~4-5 per day, sometimes splits with daughter Triggers include: waking up, after meals  Quit Attempt History: Methods tried in the past include: nicotine patches - reports irritation from patches previously  Motivators to quitting include health/breathing  Has never tried Chantix.   Objective:  Lab Results  Component Value Date   HGBA1C 5.7 09/18/2020     Lab Results  Component Value Date   CREATININE 0.83 05/12/2022   BUN 21 05/12/2022   NA 140 05/12/2022   K 4.0 05/12/2022   CL 104 05/12/2022   CO2 28 05/12/2022    Lab Results  Component Value Date   CHOL 114 12/04/2020   HDL 71 12/04/2020   LDLCALC 36 12/04/2020   LDLDIRECT 115.8 06/19/2012   TRIG 37 12/04/2020   CHOLHDL 1.6 12/04/2020    Medications Reviewed Today     Reviewed by Alden Hipp, RPH-CPP (Pharmacist) on 10/27/22 at [phone]  Med List Status: <None>   Medication Order Taking? Sig Documenting Provider Last Dose Status Informant  acetaminophen (TYLENOL) 650 MG CR tablet [id number] No Take 650 mg by mouth every 8 (eight) hours as needed for pain. [provider] Taking Active Self  albuterol (VENTOLIN HFA) 108 (90 Base) MCG/ACT inhaler [id number] No USE 2 INHALATIONS BY MOUTH  INTO THE LUNGS EVERY 6  HOURS AS NEEDED FOR  WHEEZING OR SHORTNESS OF  BREATH Joaquim Nam, MD Taking Active Self  alendronate (FOSAMAX) 70 MG tablet [id number] No TAKE 1 TABLET BY MOUTH EVERY 7 DAYS. TAKE WITH A FULL GLASS OF WATER ON AN EMPTY STOMACH Joaquim Nam, MD Taking Active   aspirin EC 81 MG tablet [id number] No Take 1 tablet (81 mg total) by mouth daily. Swallow whole. Cantwell, Celeste C, PA-C Taking Active Self  Cholecalciferol (VITAMIN D3) 25 MCG (1000 UT) CAPS [id number] No Take 1 capsule (1,000 Units total) by mouth daily. Joaquim Nam, MD Taking Active Self  cyanocobalamin 500 MCG TABS  [id number] No Take 500 mcg by mouth daily. Joaquim Nam, MD Taking Active   diclofenac Sodium (VOLTAREN) 1 % GEL [id number] No Apply 2 g topically 4 (four) times daily. Joaquim Nam, MD Taking Active   fluticasone-salmeterol (ADVAIR [facility]) 782-95 MCG/ACT inhaler [id number]  Inhale 2 puffs into the lungs 2 (two) times daily. Joaquim Nam, MD  Active   furosemide (LASIX) 20 MG tablet [id number] No TAKE 2 TABLETS BY MOUTH IN THE  MORNING  Patient taking differently: TAKE  2 TABLETS BY MOUTH IN THE  MORNING   Joaquim Nam, MD Taking Active   levothyroxine (SYNTHROID) 50 MCG tablet [id number] No TAKE 1 TABLET BY MOUTH DAILY  EXCEPT 2 TABLETS ON SUNDAYS AND  WEDNESDAYS ( TOTAL 9 TABS PER  WEEK) Joaquim Nam, MD Taking Active   melatonin 5 MG TABS [id number] No Take 5 mg by mouth at bedtime as needed (For sleep). [provider] Taking Active Self  methocarbamol (ROBAXIN) 500 MG tablet [id number] No Take 1 tablet (500 mg total) by mouth 2 (two) times daily as needed for muscle spasms. Joaquim Nam, MD Taking Active   metoprolol tartrate (LOPRESSOR) 100 MG tablet [id number] No TAKE ONE-HALF TABLET BY  MOUTH IN THE MORNING AND 1  TABLET BY MOUTH IN THE  EVENING IF BP IS ABOVE 140/90. Joaquim Nam, MD Taking Active Self           Med Note Janelle Floor May 23, 2022  9:45 AM)    rosuvastatin (CRESTOR) 20 MG tablet [id number] No TAKE 1 TABLET BY MOUTH DAILY Joaquim Nam, MD Taking Active   XARELTO 2.5 MG TABS tablet [id number] No TAKE 1 TABLET BY MOUTH TWICE  DAILY Joaquim Nam, MD Taking Active               Assessment/Plan:   COPD: - Currently uncontrolled, cost concerns.  - Contacted [facility], per test claims brand name medications will be $11.20. She will complete current supplies of Symbicort and Advair, as she has already paid for these. We also discussed contacting her insurance to see if they could rebill her recent inhaler scripts as her Medicare Extra Help went into effect within ~ 2 weeks of those prior fills. Also discussed that moving forward, LABA/LAMA vs triple therapy may be more appropriate to reduce frequency of need for albuterol HFA rescue inhaler.   Tobacco Abuse - Currently uncontrolled, but patient is motivated to reduce/eliminate tobacco  - Provided motivational interviewing to assess tobacco use and strategies for reduction. Discussed strategies to "distract" herself during her typical times of smoking.  Discussed Chantix, including mechanism of action, side effects. Patient would like to think about this   Follow Up Plan: pharmacy needs met. She will outreach with future concerns.   Catie Eppie Gibson, PharmD, BCACP, CPP Clinical Pharmacist [facility] [phone]

## 2022-10-31 ENCOUNTER — Telehealth: Payer: Self-pay | Admitting: Family Medicine

## 2022-10-31 NOTE — Telephone Encounter (Signed)
I spoke with Jill Franco; Jill Franco said on 10/30/22 Jill Franco had intermittent SOB with exertion; Jill Franco had heart burn at 3 AM and Tums alleviated that. 10/31/22 no CP,SOB, dizziness, H/A or vision changes.Jill Franco already has appt to see Dr Para March on 11/01/22 at 9AM. UC & ED precautions given and Jill Franco voiced understanding. Sending note to Dr Para March and Para March pool.

## 2022-10-31 NOTE — Telephone Encounter (Signed)
Noted. Thanks.

## 2022-10-31 NOTE — Telephone Encounter (Signed)
FYI: This call has been transferred to Access Nurse. Once the result note has been entered staff can address the message at that time.  Patient called in with the following symptoms:  Red Word: sob   Please advise at Mobile [phone] (mobile)  Message is routed to Provider Pool and [facility] Triage    Pt states she has sob here & there, no other symptoms. only wanted to be seen by Dr. Para March. Scheduled pt for tomorrow, 9/10. Pt declined triage

## 2022-11-01 ENCOUNTER — Ambulatory Visit (INDEPENDENT_AMBULATORY_CARE_PROVIDER_SITE_OTHER)
Admission: RE | Admit: 2022-11-01 | Discharge: 2022-11-01 | Disposition: A | Discharge status: Home / Self Care | Payer: Medicare Other | Source: Ambulatory Visit | Attending: Family Medicine | Admitting: Family Medicine

## 2022-11-01 ENCOUNTER — Other Ambulatory Visit: Payer: Self-pay | Admitting: Family Medicine

## 2022-11-01 ENCOUNTER — Encounter: Payer: Self-pay | Admitting: Family Medicine

## 2022-11-01 ENCOUNTER — Ambulatory Visit (INDEPENDENT_AMBULATORY_CARE_PROVIDER_SITE_OTHER): Payer: Medicare Other | Admitting: Family Medicine

## 2022-11-01 VITALS — BP 122/64 | HR 77 | Temp 98.1°F | Ht 64.0 in | Wt 98.2 lb

## 2022-11-01 DIAGNOSIS — I700 Atherosclerosis of aorta: Secondary | ICD-10-CM | POA: Diagnosis not present

## 2022-11-01 DIAGNOSIS — J449 Chronic obstructive pulmonary disease, unspecified: Secondary | ICD-10-CM | POA: Diagnosis not present

## 2022-11-01 DIAGNOSIS — R0602 Shortness of breath: Secondary | ICD-10-CM | POA: Diagnosis not present

## 2022-11-01 DIAGNOSIS — R0600 Dyspnea, unspecified: Secondary | ICD-10-CM | POA: Diagnosis not present

## 2022-11-01 DIAGNOSIS — R918 Other nonspecific abnormal finding of lung field: Secondary | ICD-10-CM | POA: Diagnosis not present

## 2022-11-01 MED ORDER — FUROSEMIDE 20 MG PO TABS
ORAL_TABLET | ORAL | Status: DC
Start: 2022-11-01 — End: 2023-02-20

## 2022-11-01 MED ORDER — METOPROLOL TARTRATE 100 MG PO TABS: ORAL_TABLET | ORAL | Status: DC

## 2022-11-01 MED ORDER — PREDNISONE 20 MG PO TABS
ORAL_TABLET | ORAL | 0 refills | Status: DC
Start: 2022-11-01 — End: 2022-12-26

## 2022-11-01 MED ORDER — DOXYCYCLINE HYCLATE 100 MG PO TABS
100.0000 mg | ORAL_TABLET | Freq: Two times a day (BID) | ORAL | 0 refills | Status: DC
Start: 2022-11-01 — End: 2022-12-26

## 2022-11-01 NOTE — Progress Notes (Unsigned)
Started 10/30/22. Woke up 10/28/22 SOB.  No clear trigger.    She had been off metoprolol and BP has been controlled.  Discussed prn use.   Still using symbicort daily with plan to change to advair. But still on routine preventive med in the meantime.  Using SABA prn.  Baseline 0-2 dose of SABA per day.    Now using SABA 3-4 times per day, over the last few days.  It helped a little.    Yesterday was fine with no SOB at all. Woke up today with the SOB again. Tried inhalers w/o much relief initially but then with some improvement.  Better at time of OV but not back to baseline.  Some cough, clear sputum recently.  More than normal.  More wheeze than normal.  No fevers.  No vomiting.  Weight is stable recently.  No more BLE edema.    Meds, vitals, and allergies reviewed.   ROS: Per HPI unless specifically indicated in ROS section   Nad [place] Neck supple, no LA Rrr Ctab No focal dec in BS Occ cough noted.  Abd soft, not ttp Skin well perfused.   25 minutes were devoted to patient care in this encounter (this includes time spent reviewing the patient's file/history, interviewing and examining the patient, counseling/reviewing plan with patient).

## 2022-11-01 NOTE — Patient Instructions (Signed)
Go to the lab on the way out.   If you have mychart we'll likely use that to update you.     Ask about getting a flu shot at your lab appointment later this month- assuming you are feeling better at the time.   Prednisone with food.  Start doxycycline.  Continue using symbicort for now then change to advair when that runs out.  Use albuterol if needed in the meantime.   If worse, go to the ER.  Update [place] in about 2 days otherwise.  Take care.  Glad to see you.

## 2022-11-02 NOTE — Assessment & Plan Note (Signed)
Presumed exacerbation  Check cxr She can about getting a flu shot at your lab appointment later this month- assuming she is feeling better at the time.   Prednisone with food. Steroid cautions d/w pt.  Start doxycycline.  Continue using symbicort for now then change to advair when that runs out.  Use albuterol if needed in the meantime.   If worse, go to the ER.  Update [place] in about 2 days otherwise.  She agrees.

## 2022-11-15 DIAGNOSIS — H43391 Other vitreous opacities, right eye: Secondary | ICD-10-CM | POA: Diagnosis not present

## 2022-11-15 DIAGNOSIS — H43811 Vitreous degeneration, right eye: Secondary | ICD-10-CM | POA: Diagnosis not present

## 2022-11-18 ENCOUNTER — Other Ambulatory Visit: Payer: Medicare Other

## 2022-11-21 ENCOUNTER — Telehealth: Payer: Self-pay | Admitting: Family Medicine

## 2022-11-21 ENCOUNTER — Other Ambulatory Visit (INDEPENDENT_AMBULATORY_CARE_PROVIDER_SITE_OTHER): Payer: Medicare Other

## 2022-11-21 DIAGNOSIS — E039 Hypothyroidism, unspecified: Secondary | ICD-10-CM | POA: Diagnosis not present

## 2022-11-21 DIAGNOSIS — I10 Essential (primary) hypertension: Secondary | ICD-10-CM

## 2022-11-21 DIAGNOSIS — D649 Anemia, unspecified: Secondary | ICD-10-CM | POA: Diagnosis not present

## 2022-11-21 LAB — LIPID PANEL
Cholesterol: 183 mg/dL (ref 0–200)
HDL: 99.9 mg/dL (ref >39.00)
LDL Cholesterol: 64 mg/dL (ref 0–99)
NonHDL: 83.54
Total CHOL/HDL Ratio: 2
Triglycerides: 97 mg/dL (ref 0.0–149.0)
VLDL: 19.4 mg/dL (ref 0.0–40.0)

## 2022-11-21 LAB — CBC WITH DIFFERENTIAL/PLATELET
Basophils Absolute: 0.1 10*3/uL (ref 0.0–0.1)
Basophils Relative: 1.2 % (ref 0.0–3.0)
Eosinophils Absolute: 0.2 10*3/uL (ref 0.0–0.7)
Eosinophils Relative: 2.8 % (ref 0.0–5.0)
HCT: 49.2 % — ABNORMAL HIGH (ref 36.0–46.0)
Hemoglobin: 16.1 g/dL — ABNORMAL HIGH (ref 12.0–15.0)
Lymphocytes Relative: 8 % — ABNORMAL LOW (ref 12.0–46.0)
Lymphs Abs: 0.6 10*3/uL — ABNORMAL LOW (ref 0.7–4.0)
MCHC: 32.7 g/dL (ref 30.0–36.0)
MCV: 98.9 fL (ref 78.0–100.0)
Monocytes Absolute: 0.5 10*3/uL (ref 0.1–1.0)
Monocytes Relative: 7.5 % (ref 3.0–12.0)
Neutro Abs: 5.7 10*3/uL (ref 1.4–7.7)
Neutrophils Relative %: 80.5 % — ABNORMAL HIGH (ref 43.0–77.0)
Platelets: 193 10*3/uL (ref 150.0–400.0)
RBC: 4.98 Mil/uL (ref 3.87–5.11)
RDW: 13.8 % (ref 11.5–15.5)
WBC: 7 10*3/uL (ref 4.0–10.5)

## 2022-11-21 LAB — TSH: TSH: 1.75 u[IU]/mL (ref 0.35–5.50)

## 2022-11-21 LAB — VITAMIN B12: Vitamin B-12: >1501 pg/mL — ABNORMAL HIGH (ref 211–911)

## 2022-11-21 LAB — IRON: Iron: 55 ug/dL (ref 42–145)

## 2022-11-21 LAB — FERRITIN: Ferritin: 25.7 ng/mL (ref 10.0–291.0)

## 2022-11-21 MED ORDER — ALENDRONATE SODIUM 70 MG PO TABS: 70.0000 mg | ORAL_TABLET | ORAL | 0 refills | Status: DC

## 2022-11-21 MED ORDER — ALBUTEROL SULFATE HFA 108 (90 BASE) MCG/ACT IN AERS: 2.0000 | INHALATION_SPRAY | Freq: Four times a day (QID) | RESPIRATORY_TRACT | 2 refills | Status: DC | PRN

## 2022-11-21 NOTE — Telephone Encounter (Signed)
Prescription Request  11/21/2022  LOV: 11/01/2022  What is the name of the medication or equipment? albuterol (VENTOLIN HFA) 108 (90 Base) MCG/ACT inhaler alendronate (FOSAMAX) 70 MG tablet   Have you contacted your pharmacy to request a refill? Yes   Which pharmacy would you like this sent to?  [facility] - [place], [place] - [address] [place]  [address] Phone: [phone] Fax: [phone]  [facility] [address] [place]), [place] - [address] [place]) [place] [address] Phone: [phone] Fax: [phone]    Patient notified that their request is being sent to the clinical staff for review and that they should receive a response within 2 business days.   Please advise at [facility] [phone]

## 2022-11-21 NOTE — Addendum Note (Signed)
Addended by: Wendie Simmer B on: 11/21/2022 03:46 PM   Modules accepted: Orders

## 2022-11-21 NOTE — Telephone Encounter (Signed)
Erxs sent 

## 2022-11-23 ENCOUNTER — Other Ambulatory Visit: Payer: Self-pay | Admitting: Family Medicine

## 2022-11-23 DIAGNOSIS — D649 Anemia, unspecified: Secondary | ICD-10-CM

## 2022-11-23 MED ORDER — VITAMIN B 12 500 MCG PO TABS
500.0000 ug | ORAL_TABLET | ORAL | Status: DC
Start: 2022-11-23 — End: 2023-03-17

## 2022-11-29 ENCOUNTER — Ambulatory Visit: Payer: Self-pay

## 2022-11-29 NOTE — Patient Outreach (Signed)
Care Coordination   Follow Up Visit Note   11/29/2022 Name: BARBERA HINZE MRN: [id number] DOB: [birth year]  CHIMERE WILMETH is a 88 y.o. year old female who sees Joaquim Nam, MD for primary care. I spoke with  [name] by phone today.  What matters to the patients health and wellness today?  Patient reports her breathing was bad last night.  She reports taking her inhalers this morning and prescribed medications.  She states she feels much better.  Patient states she is aware that on cooler or hotter days her COPD can flare up.  Patient states she continues to smoke. She said she is unsure how many cigarettes she is smoking per day.  She states she hasn't kept up with is since having a visit with her primary care provider on 11/01/22.  Patient verbally agreed to receiving education article on smoking cessation.      Goals Addressed             This Visit's Progress    Patient stated goal: "I'd like to focus on my COPD and health conditions"       Interventions Today    Flowsheet Row Most Recent Value  Chronic Disease   Chronic disease during today's visit Chronic Obstructive Pulmonary Disease (COPD)  General Interventions   General Interventions Discussed/Reviewed General Interventions Reviewed, Vaccines, Labs, Doctor Visits  [evaluation of current treatment plan for COPD and patients adherence to plan as established by provider. Assessed for COPD symptoms.]  Vaccines RSV, Flu  Doctor Visits Discussed/Reviewed Doctor Visits Reviewed  Education Interventions   Education Provided Provided Education  [Discussed smoking cessation. Mailed patient education article regarding smoking cessation strategies. Encouraged patient to continue decreasing the number of cigarettes smoked per day.]  Provided Verbal Education On When to see the doctor  [Advised to follow up with provider for mild/ moderate COPD symptoms and call 911 for severe symptoms. Reviewed COPD symptoms and action  plan. Advised to monitor weather and stay indoors if to cold/ hot.]  Pharmacy Interventions   Pharmacy Dicussed/Reviewed Pharmacy Topics Reviewed  Algis Downs to take breathing medications as prescribed to help prevent flare ups.  Take all medications as prescribed.]                 SDOH assessments and interventions completed:  No     Care Coordination Interventions:  Yes, provided   Follow up plan: Follow up call scheduled for 01/05/23    Encounter Outcome:  Patient Visit Completed   George Ina RN,BSN,CCM [facility] Care Coordination [phone] direct line

## 2022-11-29 NOTE — Patient Instructions (Signed)
Visit Information  Thank you for taking time to visit with me today. Please don't hesitate to contact me if I can be of assistance to you.   Following are the goals we discussed today:   Goals Addressed             This Visit's Progress    Patient stated goal: "I'd like to focus on my COPD and health conditions"       Interventions Today    Flowsheet Row Most Recent Value  Chronic Disease   Chronic disease during today's visit Chronic Obstructive Pulmonary Disease (COPD)  General Interventions   General Interventions Discussed/Reviewed General Interventions Reviewed, Vaccines, Labs, Doctor Visits  [evaluation of current treatment plan for COPD and patients adherence to plan as established by provider. Assessed for COPD symptoms.]  Vaccines RSV, Flu  Doctor Visits Discussed/Reviewed Doctor Visits Reviewed  Education Interventions   Education Provided Provided Education  [Discussed smoking cessation. Mailed patient education article regarding smoking cessation strategies. Encouraged patient to continue decreasing the number of cigarettes smoked per day.]  Provided Verbal Education On When to see the doctor  [Advised to follow up with provider for mild/ moderate COPD symptoms and call 911 for severe symptoms. Reviewed COPD symptoms and action plan. Advised to monitor weather and stay indoors if to cold/ hot.]  Pharmacy Interventions   Pharmacy Dicussed/Reviewed Pharmacy Topics Reviewed  Algis Downs to take breathing medications as prescribed to help prevent flare ups.  Take all medications as prescribed.]                 Our next appointment is by telephone on 01/05/23 at 10 am  Please call the care guide team at [phone] if you need to cancel or reschedule your appointment.   If you are experiencing a Mental Health or Behavioral Health Crisis or need someone to talk to, please call the Suicide and Crisis Lifeline: 988 call 1-800-273-TALK (toll free, 24 hour hotline)  The  patient verbalized understanding of instructions, educational materials, and care plan provided today and agreed to receive a mailed copy of patient instructions, educational materials, and care plan.   George Ina RN,BSN,CCM [facility] Care Coordination [phone] direct line   Managing the Challenge of Quitting Smoking Quitting smoking is a physical and mental challenge. You may have cravings, withdrawal symptoms, and temptation to smoke. Before quitting, work with your health care provider to make a plan that can help you manage quitting. Making a plan before you quit may keep you from smoking when you have the urge to smoke while trying to quit. How to manage lifestyle changes Managing stress Stress can make you want to smoke, and wanting to smoke may cause stress. It is important to find ways to manage your stress. You could try some of the following: Practice relaxation techniques. Breathe slowly and deeply, in through your nose and out through your mouth. Listen to music. Soak in a bath or take a shower. Imagine a peaceful place or vacation. Get some support. Talk with family or friends about your stress. Join a support group. Talk with a counselor or therapist. Get some physical activity. Go for a walk, run, or bike ride. Play a favorite sport. Practice yoga.  Medicines Talk with your health care provider about medicines that might help you deal with cravings and make quitting easier for you. Relationships Social situations can be difficult when you are quitting smoking. To manage this, you can: Avoid parties and other social situations where people might  be smoking. Avoid alcohol. Leave right away if you have the urge to smoke. Explain to your family and friends that you are quitting smoking. Ask for support and let them know you might be a bit grumpy. Plan activities where smoking is not an option. General instructions Be aware that many people gain weight after they quit  smoking. However, not everyone does. To keep from gaining weight, have a plan in place before you quit, and stick to the plan after you quit. Your plan should include: Eating healthy snacks. When you have a craving, it may help to: Eat popcorn, or try carrots, celery, or other cut vegetables. Chew sugar-free gum. Changing how you eat. Eat small portion sizes at meals. Eat 4-6 small meals throughout the day instead of 1-2 large meals a day. Be mindful when you eat. You should avoid watching television or doing other things that might distract you as you eat. Exercising regularly. Make time to exercise each day. If you do not have time for a long workout, do short bouts of exercise for 5-10 minutes several times a day. Do some form of strengthening exercise, such as weight lifting. Do some exercise that gets your heart beating and causes you to breathe deeply, such as walking fast, running, swimming, or biking. This is very important. Drinking plenty of water or other low-calorie or no-calorie drinks. Drink enough fluid to keep your urine pale yellow.  How to recognize withdrawal symptoms Your body and mind may experience discomfort as you try to get used to not having nicotine in your system. These effects are called withdrawal symptoms. They may include: Feeling hungrier than normal. Having trouble concentrating. Feeling irritable or restless. Having trouble sleeping. Feeling depressed. Craving a cigarette. These symptoms may surprise you, but they are normal to have when quitting smoking. To manage withdrawal symptoms: Avoid places, people, and activities that trigger your cravings. Remember why you want to quit. Get plenty of sleep. Avoid coffee and other drinks that contain caffeine. These may worsen some of your symptoms. How to manage cravings Come up with a plan for how to deal with your cravings. The plan should include the following: A definition of the specific situation you  want to deal with. An activity or action you will take to replace smoking. A clear idea for how this action will help. The name of someone who could help you with this. Cravings usually last for 5-10 minutes. Consider taking the following actions to help you with your plan to deal with cravings: Keep your mouth busy. Chew sugar-free gum. Suck on hard candies or a straw. Brush your teeth. Keep your hands and body busy. Change to a different activity right away. Squeeze or play with a ball. Do an activity or a hobby, such as making bead jewelry, practicing needlepoint, or working with wood. Mix up your normal routine. Take a short exercise break. Go for a quick walk, or run up and down stairs. Focus on doing something kind or helpful for someone else. Call a friend or family member to talk during a craving. Join a support group. Contact a quitline. Where to find support To get help or find a support group: Call the National Cancer Institute's Smoking Quitline: 1-800-QUIT-NOW 4077080501) Text QUIT to SmokefreeTXT: 147829 Where to find more information Visit these websites to find more information on quitting smoking: U.S. Department of Health and Human Services: www.smokefree.gov American Lung Association: www.freedomfromsmoking.org Centers for Disease Control and Prevention (CDC): FootballExhibition.com.br American Heart Association:  www.heart.org Contact a health care provider if: You want to change your plan for quitting. The medicines you are taking are not helping. Your eating feels out of control or you cannot sleep. You feel depressed or become very anxious. Summary Quitting smoking is a physical and mental challenge. You will face cravings, withdrawal symptoms, and temptation to smoke again. Preparation can help you as you go through these challenges. Try different techniques to manage stress, handle social situations, and prevent weight gain. You can deal with cravings by keeping your  mouth busy (such as by chewing gum), keeping your hands and body busy, calling family or friends, or contacting a quitline for people who want to quit smoking. You can deal with withdrawal symptoms by avoiding places where people smoke, getting plenty of rest, and avoiding drinks that contain caffeine. This information is not intended to replace advice given to you by your health care provider. Make sure you discuss any questions you have with your health care provider. Document Revised: 01/29/2021 Document Reviewed: 01/29/2021 Elsevier Patient Education  2024 [employer].

## 2022-12-22 ENCOUNTER — Telehealth: Payer: Self-pay | Admitting: Family Medicine

## 2022-12-22 ENCOUNTER — Ambulatory Visit (INDEPENDENT_AMBULATORY_CARE_PROVIDER_SITE_OTHER): Payer: Medicare Other

## 2022-12-22 DIAGNOSIS — Z23 Encounter for immunization: Secondary | ICD-10-CM

## 2022-12-22 NOTE — Telephone Encounter (Signed)
Prescription Request  12/22/2022  LOV: 11/01/2022  What is the name of the medication or equipment? predniSONE (DELTASONE) 20 MG tablet   Have you contacted your pharmacy to request a refill? Yes   Which pharmacy would you like this sent to?  [facility] [address] ([address]), [place] - [address] [place]) [place] [address] Phone: [phone] Fax: [phone]    Patient notified that their request is being sent to the clinical staff for review and that they should receive a response within 2 business days.   Please advise at [facility] [phone]

## 2022-12-23 NOTE — Telephone Encounter (Signed)
Can we make sure the patient is not in an active flare or having any symptoms that need to be addressed before Dr. Para March is back on office

## 2022-12-23 NOTE — Telephone Encounter (Signed)
Called patient asked what she is needing the prednisone for. She states that she has COPD and it helps with her morning flair ups

## 2022-12-23 NOTE — Telephone Encounter (Addendum)
I spoke with pt; pt said for a long time (pt could not give a frame of time other than a long time) that when pt wakes up in the morning pt cannot get her breath. Pt said sx usually last 2 - 3 hrs. Inhalers help some but not as much as used to. No available appts at [facility] or [facility] and pt does not want to go to [facility] or ED now. Pt scheduled appt with Audria Nine NP on 12/26/22 at 8:40 with UC &ED precautions and pt voiced understanding., pt still has both  inhalers to use. Sending note to Audria Nine NP.

## 2022-12-26 ENCOUNTER — Encounter: Payer: Self-pay | Admitting: Nurse Practitioner

## 2022-12-26 ENCOUNTER — Ambulatory Visit (INDEPENDENT_AMBULATORY_CARE_PROVIDER_SITE_OTHER)
Admission: RE | Admit: 2022-12-26 | Discharge: 2022-12-26 | Disposition: A | Discharge status: Home / Self Care | Payer: Medicare Other | Source: Ambulatory Visit | Attending: Nurse Practitioner

## 2022-12-26 ENCOUNTER — Ambulatory Visit (INDEPENDENT_AMBULATORY_CARE_PROVIDER_SITE_OTHER): Payer: Medicare Other | Admitting: Nurse Practitioner

## 2022-12-26 VITALS — BP 102/70 | HR 74 | Temp 97.7°F | Ht 64.0 in | Wt 100.2 lb

## 2022-12-26 DIAGNOSIS — J441 Chronic obstructive pulmonary disease with (acute) exacerbation: Secondary | ICD-10-CM

## 2022-12-26 DIAGNOSIS — R918 Other nonspecific abnormal finding of lung field: Secondary | ICD-10-CM | POA: Diagnosis not present

## 2022-12-26 DIAGNOSIS — R058 Other specified cough: Secondary | ICD-10-CM | POA: Diagnosis not present

## 2022-12-26 MED ORDER — PREDNISONE 20 MG PO TABS
ORAL_TABLET | ORAL | 0 refills | Status: AC
Start: 2022-12-26 — End: 2023-01-03

## 2022-12-26 MED ORDER — DOXYCYCLINE HYCLATE 100 MG PO TABS
100.0000 mg | ORAL_TABLET | Freq: Two times a day (BID) | ORAL | 0 refills | Status: AC
Start: 2022-12-26 — End: 2023-01-02

## 2022-12-26 NOTE — Progress Notes (Signed)
Acute Office Visit  Subjective:     Patient ID: Jill Franco, female    DOB: 04-18-1934, 89 y.o.   MRN: [id number]  Chief Complaint  Patient presents with   Respiratory Distress    Pt complains of smoking cigarettes again. States that breathing has gotten worse. Pt states she uses both inhalers daily.     HPI Patient is in today for breathing changes with a history of smoking, COPD, cva, hld, htn, pvd  States that she has been workin go the symbicort and then start on the advair. States that she has not switched. State she is using the albuterol 3-4 times. States that over the past 3-4 weeks she has been having increased albuterol use  States that she tradionally does not cough up sputum. She is having some clear production and dyspnea on exertion.  States that she hsd cut back on the cigarette use and she has started back. States that she is working  Review of Systems  Constitutional:  Negative for chills and fever.  HENT:  Negative for ear discharge, ear pain and sore throat.   Respiratory:  Positive for cough, sputum production and shortness of breath.   Cardiovascular:  Negative for chest pain.  Neurological:  Negative for headaches.  Psychiatric/Behavioral:  Negative for hallucinations and suicidal ideas.         Objective:    BP 102/70   Pulse 74   Temp 97.7 F (36.5 C) (Oral)   Ht 5\' 4"  (1.626 m)   Wt 100 lb 3.2 oz (45.5 kg)   SpO2 94%   BMI 17.20 kg/m  BP Readings from Last 3 Encounters:  12/26/22 102/70  11/01/22 122/64  09/27/22 122/78   Wt Readings from Last 3 Encounters:  12/26/22 100 lb 3.2 oz (45.5 kg)  11/01/22 98 lb 3.2 oz (44.5 kg)  09/27/22 99 lb 3.2 oz (45 kg)   SpO2 Readings from Last 3 Encounters:  12/26/22 94%  11/01/22 93%  09/27/22 96%      Physical Exam Vitals and nursing note reviewed.  Constitutional:      Appearance: Normal appearance.  HENT:     Right Ear: Ear canal and external ear normal.     Left Ear: Tympanic  membrane, ear canal and external ear normal.     Mouth/Throat:     Mouth: Mucous membranes are moist.     Pharynx: Oropharynx is clear.  Cardiovascular:     Rate and Rhythm: Normal rate and regular rhythm.     Heart sounds: Normal heart sounds.  Pulmonary:     Effort: Pulmonary effort is normal.     Comments: Decreased globally  Musculoskeletal:     Right lower leg: Edema present.     Left lower leg: Edema present.  Neurological:     Mental Status: She is alert.     No results found for any visits on 12/26/22.      Assessment & Plan:   Problem List Items Addressed This Visit       Respiratory   COPD with acute exacerbation (HCC) - Primary    Patient using albuterol 3-4 times a day  With relief.  Patient is still on Symbicort suggested she go ahead and switch over to Advair.  If patient still has trouble consider doing a triple agent inhaler.  Will obtain chest x-ray to doxycycline and prednisone.  Prednisone precautions discussed with patient      Relevant Medications   predniSONE (DELTASONE) 20 MG  tablet   doxycycline (VIBRA-TABS) 100 MG tablet   Other Relevant Orders   DG Chest 2 View    Meds ordered this encounter  Medications   predniSONE (DELTASONE) 20 MG tablet    Sig: Take 2 tablets (40 mg total) by mouth daily with breakfast for 4 days, THEN 1 tablet (20 mg total) daily with breakfast for 4 days. Avoid NSAIDs.    Dispense:  12 tablet    Refill:  0    Order Specific Question:   Supervising Provider    Answer:   Milinda Antis, MARNE A [[id number]]   doxycycline (VIBRA-TABS) 100 MG tablet    Sig: Take 1 tablet (100 mg total) by mouth 2 (two) times daily for 7 days.    Dispense:  14 tablet    Refill:  0    Order Specific Question:   Supervising Provider    Answer:   TOWER, MARNE A [[id number]]    Return if symptoms worsen or fail to improve.  Audria Nine, NP

## 2022-12-26 NOTE — Assessment & Plan Note (Signed)
Patient using albuterol 3-4 times a day  With relief.  Patient is still on Symbicort suggested she go ahead and switch over to Advair.  If patient still has trouble consider doing a triple agent inhaler.  Will obtain chest x-ray to doxycycline and prednisone.  Prednisone precautions discussed with patient

## 2022-12-26 NOTE — Patient Instructions (Signed)
Nice to see you today Take the prednisone with food I will be in touch with the xray results once I have them Follow up if you do not improve  I will update Dr. Para March on our talks today

## 2022-12-28 NOTE — Telephone Encounter (Signed)
Greatly appreciate help from [place] seeing this patient.

## 2022-12-30 ENCOUNTER — Telehealth: Payer: Self-pay

## 2022-12-30 DIAGNOSIS — J449 Chronic obstructive pulmonary disease, unspecified: Secondary | ICD-10-CM

## 2022-12-30 NOTE — Telephone Encounter (Signed)
Please check to see if she is had been using advair vs symbicort, separately from the albuterol.    Please let me know.  Thanks.

## 2022-12-30 NOTE — Telephone Encounter (Signed)
Called patient she is taking Symbicort daily and Albuterol as needed. She started Symbicort today. She did not feel the Advair was helping so she stopped taking yesterday.

## 2022-12-30 NOTE — Telephone Encounter (Signed)
Received refill request from [facility] for symbicort but the notes show that the symbicort was discontinued. I called and I spoke with patient to obtain clarification and patient states that the symbicort was discontinued and she was switched to the albuterol but she thinks that is not working. So she is using the symbicort. Please advise if you want to refill the symbicort and have patient go back to that rx.

## 2023-01-01 MED ORDER — BUDESONIDE-FORMOTEROL FUMARATE 160-4.5 MCG/ACT IN AERO: 2.0000 | INHALATION_SPRAY | Freq: Two times a day (BID) | RESPIRATORY_TRACT | 3 refills | Status: DC

## 2023-01-01 NOTE — Telephone Encounter (Signed)
I sent the refill for symbicort and updated her med list. If changing to symbicort doesn't help, then please set up f/u OV.  Thanks.

## 2023-01-02 NOTE — Telephone Encounter (Signed)
Pt is aware of refill but had some Symbicort at home and has already started it.  She will call back is symptoms do not improve.

## 2023-01-02 NOTE — Telephone Encounter (Signed)
Left message to return call to our office.  

## 2023-01-04 DIAGNOSIS — H26493 Other secondary cataract, bilateral: Secondary | ICD-10-CM | POA: Diagnosis not present

## 2023-01-04 DIAGNOSIS — I10 Essential (primary) hypertension: Secondary | ICD-10-CM | POA: Diagnosis not present

## 2023-01-04 DIAGNOSIS — H35033 Hypertensive retinopathy, bilateral: Secondary | ICD-10-CM | POA: Diagnosis not present

## 2023-01-04 DIAGNOSIS — H43393 Other vitreous opacities, bilateral: Secondary | ICD-10-CM | POA: Diagnosis not present

## 2023-01-05 ENCOUNTER — Ambulatory Visit: Payer: Self-pay

## 2023-01-05 NOTE — Patient Outreach (Signed)
  Care Coordination   Follow Up Visit Note   01/05/2023 Name: ROSELINDA LINGG MRN: [id number] DOB: [birth year]  JAPNEET THUNDER is a 88 y.o. year old female who sees Joaquim Nam, MD for primary care. I spoke with  [name] by phone today.  What matters to the patients health and wellness today?  Patient states she is feeling much better.  She reports increase in COPD symptoms have resolved. She reports having a slight cough with mild sputum production. Patient states she completed her prednisone prescription and is using her inhalers as recommended.  Patient states she received the education article from [facility] on smoking cessation and is implementing some of the strategies.      Goals Addressed             This Visit's Progress    Patient stated goal: "I'd like to focus on my COPD and health conditions"       Interventions Today    Flowsheet Row Most Recent Value  Chronic Disease   Chronic disease during today's visit Chronic Obstructive Pulmonary Disease (COPD)  General Interventions   General Interventions Discussed/Reviewed General Interventions Reviewed, Doctor Visits  [evaluation of current treatment plan for COPD and patients adherence to plan as established by provider.  Assessed for ongoing COPD symptoms.]  Doctor Visits Discussed/Reviewed Doctor Visits Reviewed  Education Interventions   Education Provided Provided Education  [reviewed COPD action plan. Discussed smoking cessation]  Provided Verbal Education On When to see the doctor  [Discussed COPD symptoms and when to notify provider for flare up. Advised to call 911 for severe symptoms.]  Pharmacy Interventions   Pharmacy Dicussed/Reviewed Pharmacy Topics Reviewed  [Confirmed patient completed prednisone prescription. Advised to take all other medications/ inhalers as prescribed.]                 SDOH assessments and interventions completed:  No     Care Coordination Interventions:  Yes,  provided   Follow up plan: Follow up call scheduled for 03/13/23    Encounter Outcome:  Patient Visit Completed   George Ina RN,BSN,CCM [facility]  [facility] coordinator / Case Manager Phone: [phone]

## 2023-01-05 NOTE — Patient Instructions (Addendum)
Visit Information  Thank you for taking time to visit with me today. Please don't hesitate to contact me if I can be of assistance to you.   Following are the goals we discussed today:   Goals Addressed             This Visit's Progress    Patient stated goal: "I'd like to focus on my COPD and health conditions"       Interventions Today    Flowsheet Row Most Recent Value  Chronic Disease   Chronic disease during today's visit Chronic Obstructive Pulmonary Disease (COPD)  General Interventions   General Interventions Discussed/Reviewed General Interventions Reviewed, Doctor Visits  [evaluation of current treatment plan for COPD and patients adherence to plan as established by provider.  Assessed for ongoing COPD symptoms.]  Doctor Visits Discussed/Reviewed Doctor Visits Reviewed  Education Interventions   Education Provided Provided Education  [reviewed COPD action plan. Discussed smoking cessation]  Provided Verbal Education On When to see the doctor  [Discussed COPD symptoms and when to notify provider for flare up. Advised to call 911 for severe symptoms.]  Pharmacy Interventions   Pharmacy Dicussed/Reviewed Pharmacy Topics Reviewed  [Confirmed patient completed prednisone prescription. Advised to take all other medications/ inhalers as prescribed.]                 Our next appointment is by telephone on 03/12/22 at 10 am  Please call the care guide team at [phone] if you need to cancel or reschedule your appointment.   If you are experiencing a Mental Health or Behavioral Health Crisis or need someone to talk to, please call the Suicide and Crisis Lifeline: 988 call 1-800-273-TALK (toll free, 24 hour hotline)  Patient verbalizes understanding of instructions and care plan provided today and agrees to view in MyChart. Active MyChart status and patient understanding of how to access instructions and care plan via MyChart confirmed with patient.     Jill Ina  RN,BSN,CCM [place]  [facility], [facility] coordinator / Case Manager Phone: [phone]

## 2023-01-06 ENCOUNTER — Telehealth: Payer: Self-pay

## 2023-01-06 NOTE — Patient Outreach (Signed)
  Care Coordination   Follow Up Visit Note   01/06/2023 Name: Jill Franco MRN: [id number] DOB: 05-26-1934  TIFNEY JEWART is a 88 y.o. year old female who sees Joaquim Nam, MD for primary care. I spoke with  [name] by phone today.  What matters to the patients health and wellness today?  Patient states she received call from her mail order pharmacy [facility] regarding a refill on her medications.  Patient state it was a robotic call and she was told she had 8 medications for refill.  Patient states this was very confusing for her because she does not have any medications that actually need to be filled at this time.  Patient requesting assistance from [facility] to help with medication refill clarification.    Goals Addressed             This Visit's Progress    Patient stated goal: "I'd like to focus on my COPD and health conditions"       Interventions Today    Flowsheet Row Most Recent Value  Chronic Disease   Chronic disease during today's visit Chronic Obstructive Pulmonary Disease (COPD)  General Interventions   General Interventions Discussed/Reviewed Communication with  Communication with --  [communication with primary provider practice clinical manager to obtain NPI number for patients primary care provider.]  Pharmacy Interventions   Pharmacy Dicussed/Reviewed Pharmacy Topics Reviewed  [All medications reviewed to confirm which medications patient is taking and to confirm whether patient did or did not need refills.  Telephone call to patients [place] [employer] care insurance carrier to clarify medication refill alert.]                 SDOH assessments and interventions completed:  No     Care Coordination Interventions:  Yes, provided   Follow up plan: Follow up call scheduled for as previously scheduled     Encounter Outcome:  Patient Visit Completed   George Ina RN,BSN,CCM [facility]  [facility], [facility]  coordinator / Case Manager Phone: [phone]

## 2023-01-18 ENCOUNTER — Other Ambulatory Visit: Payer: Self-pay | Admitting: Family Medicine

## 2023-02-20 ENCOUNTER — Telehealth: Payer: Self-pay

## 2023-02-20 DIAGNOSIS — R0600 Dyspnea, unspecified: Secondary | ICD-10-CM

## 2023-02-20 MED ORDER — FUROSEMIDE 20 MG PO TABS: ORAL_TABLET | ORAL | 1 refills | Status: DC

## 2023-02-20 NOTE — Telephone Encounter (Signed)
I spoke with pt; pt notified as instructed and pt voiced understanding. Sending confirmation note to Dr Para March as Lorain Childes that pt was notified and voiced understanding without questions and pt appreciated call.

## 2023-02-20 NOTE — Telephone Encounter (Signed)
Most recent potassium was normal.  We can recheck with next set of labs.    I would try taking 1.5 tabs of lasix per day if needed to see if that helps with swelling w/o causing lightheadedness.  I would defer potassium and B12 restart at this point.    Please let me know if 1.5 tabs of lasix is effective and tolerated and how long she has effect from 1 dose.    Thanks.

## 2023-02-20 NOTE — Telephone Encounter (Signed)
Pt came to office and about 2 wks ago pt started having problems with feet, ankles and lower legs swelling. Pt said no change in routine that would cause swelling. Pt restricts salt and minimizes pork intake. Pt is not having CP,SOB,H/A or vision changes. Pt said for last couple of weeks when takes lasix 20 mg taking 2 tabs pt gets very lightheaded when up moving around. Pt said taking one lasix does not help the swelling.. taking 2 lasix does help swelling but the day of taking med pt has a lot of lightheadedness. 2nd day after lasix still has lightheadedness but not as often and third day after lasix lightheadedness is gone. Last took lasix before Christmas. No lightheadedness now but both lower feet, ankles and lower legs are swollen., pt said overnight swelling is slightly better the next morning but swelling is still there. At 11/15/21 office visit pt was advised to skip lasix and metoprolol for now and takes lasix prn if swelling worsens. If pt take lasix she is to take potassium. Pt said has been 5 - 6 months the last time she took potassium with lasix. Pt tries not to take lasix a lot. Pt has not been taking metoprolol Offered pt appt but pt wants note sent first to Dr Para March. Pt wants to know if she should continue lasix with K or take a different fluid pill. Pt also wants to know is she should restart taking vit B pt has appt 03/27/2023 to repeat labs including b 12. Pt is in no distress now but does have swelling in both lower extremities. Pt is going to prop legs up on pillows while sitting or laying and will get socks without elastic at top of sock. [employer] [place]. Pt request cb after reviewed by Dr Para March and with holiday coming would appreciate cb 02/20/23. UC & ED precautions given and pt voiced understanding.

## 2023-02-21 NOTE — Telephone Encounter (Signed)
 Noted. Thanks.

## 2023-03-13 ENCOUNTER — Ambulatory Visit: Payer: Self-pay

## 2023-03-13 NOTE — Patient Outreach (Signed)
Care Coordination   Follow Up Visit Note   03/13/2023 Name: Jill Franco MRN: [id number] DOB: 1935-01-31  Jill Franco is a 89 y.o. year old female who sees Joaquim Nam, MD for primary care. I spoke with  [name] by phone today.  What matters to the patients health and wellness today?  Patient states she reported bilateral LE swelling to provider 01/2023.  Patient states she is taking the lasix 1 1/2 tablet and potassium as recommended. Patient states she is not taking the B12.  Patient reports the lasix is still not helping her bilateral LE swelling.  Discussed with patient it appears from primary care provider note on 02/20/24 he did not want her to continue taking potassium or B12.  Patient informed that RNCM will send message to primary care provider to clarify medication advisement. She states she also has symptoms of feeling jittery or lightheaded when taking the lasix. Patient states she continues to elevate her legs when sitting and/ or standing and adheres to a low salt diet. Patient states she still has some days where her SOB is a little worse.  She states her inhalers continue to provide relief when taking.  Patient states she is continually trying to cut back on her cigarette smoking. She states she only smokes 2-3 per day.  Declines smoking cessation assistance with patch or pill.     Goals Addressed             This Visit's Progress    Patient stated goal: "I'd like to focus on my COPD and health conditions"       Interventions Today    Flowsheet Row Most Recent Value  Chronic Disease   Chronic disease during today's visit Chronic Obstructive Pulmonary Disease (COPD), Other  [Bilateral LE swelling]  General Interventions   General Interventions Discussed/Reviewed General Interventions Reviewed, Doctor Visits, Labs  [evaluation of current treatment plan for listed health conditions and patients adherence to plan as established by provider. Assessed for  COPD and / or any new or ongoing symptoms.]  Labs --  [reminded patient of upcoming lab appointment on 03/27/23]  Doctor Visits Discussed/Reviewed Doctor Visits Reviewed  Annabell Sabal upcoming provider visits. Advised to keep follow up visits with providers as recommended.]  Education Interventions   Education Provided Provided Education  [reviewed COPD action plan.  Advised to notify provider for  symptoms and/ or call 911 for more severe symptoms. Advised to continue elevating legs/ feet when sitting and laying down. Advised to stay in on really cold days to help with SOB.]  Provided Verbal Education On Other  [Confirmed patient still getting relief from SOB when taking inhalers. Advised to report any new/ ongoing symptoms to  provider. Advised to continue weening self from cigarettes. Inquired if interested in provider prescribing pill or patch for smoking.]  Nutrition Interventions   Nutrition Discussed/Reviewed Nutrition Reviewed, Decreasing salt  [Advised ongoing adherence to low salt diet.]  Pharmacy Interventions   Pharmacy Dicussed/Reviewed Pharmacy Topics Reviewed  [medications reviewed and compliance discussed. Discussed recent medication adjustment with lasix, potassium, B12. Message sent to primary provider regarding ongoing leg swelling and clarifying if patient should be taking potassium.]              SDOH assessments and interventions completed:  No     Care Coordination Interventions:  Yes, provided   Follow up plan: Follow up call scheduled for 04/18/23 at 10:30 am    Encounter Outcome:  Patient Visit Completed  George Ina RN, BSN, CCM [employer], Population Health Case Manager Phone: [phone]

## 2023-03-13 NOTE — Patient Instructions (Signed)
Visit Information  Thank you for taking time to visit with me today. Please don't hesitate to contact me if I can be of assistance to you.   Following are the goals we discussed today:   Goals Addressed             This Visit's Progress    Patient stated goal: "I'd like to focus on my COPD and health conditions"       Interventions Today    Flowsheet Row Most Recent Value  Chronic Disease   Chronic disease during today's visit Chronic Obstructive Pulmonary Disease (COPD), Other  [Bilateral LE swelling]  General Interventions   General Interventions Discussed/Reviewed General Interventions Reviewed, Doctor Visits, Labs  [evaluation of current treatment plan for listed health conditions and patients adherence to plan as established by provider. Assessed for COPD and / or any new or ongoing symptoms.]  Labs --  [reminded patient of upcoming lab appointment on 03/27/23]  Doctor Visits Discussed/Reviewed Doctor Visits Reviewed  Annabell Sabal upcoming provider visits. Advised to keep follow up visits with providers as recommended.]  Education Interventions   Education Provided Provided Education  [reviewed COPD action plan.  Advised to notify provider for  symptoms and/ or call 911 for more severe symptoms. Advised to continue elevating legs/ feet when sitting and laying down. Advised to stay in on really cold days to help with SOB.]  Provided Verbal Education On Other  [Confirmed patient still getting relief from SOB when taking inhalers. Advised to report any new/ ongoing symptoms to  provider. Advised to continue weening self from cigarettes. Inquired if interested in provider prescribing pill or patch for smoking.]  Nutrition Interventions   Nutrition Discussed/Reviewed Nutrition Reviewed, Decreasing salt  [Advised ongoing adherence to low salt diet.]  Pharmacy Interventions   Pharmacy Dicussed/Reviewed Pharmacy Topics Reviewed  [medications reviewed and compliance discussed. Discussed recent  medication adjustment with lasix, potassium, B12. Message sent to primary provider regarding ongoing leg swelling and clarifying if patient should be taking potassium.]              Our next appointment is by telephone on 04/18/23 at 10:30 am  Please call the care guide team at [phone] if you need to cancel or reschedule your appointment.   If you are experiencing a Mental Health or Behavioral Health Crisis or need someone to talk to, please call the Suicide and Crisis Lifeline: 988 call 1-800-273-TALK (toll free, 24 hour hotline)  The patient verbalized understanding of instructions, educational materials, and care plan provided today and agreed to receive a mailed copy of patient instructions, educational materials, and care plan.   George Ina RN, BSN, CCM [employer], Population Health Case Manager Phone: [phone]

## 2023-03-15 ENCOUNTER — Telehealth: Payer: Self-pay | Admitting: Family Medicine

## 2023-03-15 NOTE — Telephone Encounter (Signed)
Please contact patient.  If she is still having swelling, please see about getting OV set up when possible.  I wouldn't restart potassium and B12 yet.  Thanks.

## 2023-03-15 NOTE — Telephone Encounter (Signed)
-----   Message from Nurse Dwana Curd sent at 03/13/2023 12:47 PM EST ----- Regarding: medication update and clarification Hi Dr. Para March,  I spoke with Ms. Schettler today and she stated she continues to have ongoing LE swelling therefore feeling the medication adjustment to her lasix is not helping. She also informed me she was still taking her potassium.  Per your note on 02/20/23 it appears that you wanted her not to continue the potassium and b12.  Is this correct? Please advise.    Thank you and have a great day,  Nash Dimmer, BSN, CCM [employer], Population Health Case Manager Phone: [phone]

## 2023-03-17 ENCOUNTER — Inpatient Hospital Stay (HOSPITAL_COMMUNITY)
Admission: EM | Admit: 2023-03-17 | Discharge: 2023-03-22 | DRG: 190 | Disposition: A | Discharge status: Home Health | Payer: Medicare Other | Attending: Internal Medicine | Admitting: Internal Medicine

## 2023-03-17 ENCOUNTER — Encounter (HOSPITAL_COMMUNITY): Payer: Self-pay

## 2023-03-17 ENCOUNTER — Other Ambulatory Visit: Payer: Self-pay

## 2023-03-17 ENCOUNTER — Emergency Department (HOSPITAL_COMMUNITY): Payer: Medicare Other

## 2023-03-17 DIAGNOSIS — E039 Hypothyroidism, unspecified: Secondary | ICD-10-CM | POA: Diagnosis present

## 2023-03-17 DIAGNOSIS — I739 Peripheral vascular disease, unspecified: Secondary | ICD-10-CM | POA: Diagnosis not present

## 2023-03-17 DIAGNOSIS — R911 Solitary pulmonary nodule: Secondary | ICD-10-CM | POA: Diagnosis present

## 2023-03-17 DIAGNOSIS — Z886 Allergy status to analgesic agent status: Secondary | ICD-10-CM

## 2023-03-17 DIAGNOSIS — Z7983 Long term (current) use of bisphosphonates: Secondary | ICD-10-CM

## 2023-03-17 DIAGNOSIS — Z809 Family history of malignant neoplasm, unspecified: Secondary | ICD-10-CM

## 2023-03-17 DIAGNOSIS — R64 Cachexia: Secondary | ICD-10-CM | POA: Diagnosis not present

## 2023-03-17 DIAGNOSIS — Z7951 Long term (current) use of inhaled steroids: Secondary | ICD-10-CM | POA: Diagnosis not present

## 2023-03-17 DIAGNOSIS — F17210 Nicotine dependence, cigarettes, uncomplicated: Secondary | ICD-10-CM | POA: Diagnosis present

## 2023-03-17 DIAGNOSIS — E43 Unspecified severe protein-calorie malnutrition: Secondary | ICD-10-CM | POA: Diagnosis present

## 2023-03-17 DIAGNOSIS — I10 Essential (primary) hypertension: Secondary | ICD-10-CM | POA: Diagnosis not present

## 2023-03-17 DIAGNOSIS — K219 Gastro-esophageal reflux disease without esophagitis: Secondary | ICD-10-CM | POA: Diagnosis not present

## 2023-03-17 DIAGNOSIS — Z7901 Long term (current) use of anticoagulants: Secondary | ICD-10-CM

## 2023-03-17 DIAGNOSIS — Z7982 Long term (current) use of aspirin: Secondary | ICD-10-CM

## 2023-03-17 DIAGNOSIS — Z79899 Other long term (current) drug therapy: Secondary | ICD-10-CM

## 2023-03-17 DIAGNOSIS — R0609 Other forms of dyspnea: Secondary | ICD-10-CM | POA: Diagnosis not present

## 2023-03-17 DIAGNOSIS — Z885 Allergy status to narcotic agent status: Secondary | ICD-10-CM | POA: Diagnosis not present

## 2023-03-17 DIAGNOSIS — Z1152 Encounter for screening for COVID-19: Secondary | ICD-10-CM

## 2023-03-17 DIAGNOSIS — J441 Chronic obstructive pulmonary disease with (acute) exacerbation: Principal | ICD-10-CM | POA: Diagnosis present

## 2023-03-17 DIAGNOSIS — J209 Acute bronchitis, unspecified: Secondary | ICD-10-CM | POA: Diagnosis not present

## 2023-03-17 DIAGNOSIS — I7409 Other arterial embolism and thrombosis of abdominal aorta: Secondary | ICD-10-CM | POA: Diagnosis present

## 2023-03-17 DIAGNOSIS — E785 Hyperlipidemia, unspecified: Secondary | ICD-10-CM | POA: Diagnosis present

## 2023-03-17 DIAGNOSIS — D649 Anemia, unspecified: Secondary | ICD-10-CM | POA: Diagnosis present

## 2023-03-17 DIAGNOSIS — Z823 Family history of stroke: Secondary | ICD-10-CM

## 2023-03-17 DIAGNOSIS — R0602 Shortness of breath: Secondary | ICD-10-CM | POA: Diagnosis not present

## 2023-03-17 DIAGNOSIS — Z79890 Hormone replacement therapy (postmenopausal): Secondary | ICD-10-CM

## 2023-03-17 DIAGNOSIS — Z888 Allergy status to other drugs, medicaments and biological substances status: Secondary | ICD-10-CM

## 2023-03-17 DIAGNOSIS — Z8673 Personal history of transient ischemic attack (TIA), and cerebral infarction without residual deficits: Secondary | ICD-10-CM | POA: Diagnosis not present

## 2023-03-17 DIAGNOSIS — R0989 Other specified symptoms and signs involving the circulatory and respiratory systems: Secondary | ICD-10-CM | POA: Diagnosis present

## 2023-03-17 LAB — CBC WITH DIFFERENTIAL/PLATELET
Abs Immature Granulocytes: 0.03 10*3/uL (ref 0.00–0.07)
Basophils Absolute: 0.1 10*3/uL (ref 0.0–0.1)
Basophils Relative: 1 %
Eosinophils Absolute: 0 10*3/uL (ref 0.0–0.5)
Eosinophils Relative: 0 %
HCT: 50.4 % — ABNORMAL HIGH (ref 36.0–46.0)
Hemoglobin: 17.3 g/dL — ABNORMAL HIGH (ref 12.0–15.0)
Immature Granulocytes: 0 %
Lymphocytes Relative: 4 %
Lymphs Abs: 0.3 10*3/uL — ABNORMAL LOW (ref 0.7–4.0)
MCH: 32.5 pg (ref 26.0–34.0)
MCHC: 34.3 g/dL (ref 30.0–36.0)
MCV: 94.6 fL (ref 80.0–100.0)
Monocytes Absolute: 0.2 10*3/uL (ref 0.1–1.0)
Monocytes Relative: 2 %
Neutro Abs: 8.4 10*3/uL — ABNORMAL HIGH (ref 1.7–7.7)
Neutrophils Relative %: 93 %
Platelets: 216 10*3/uL (ref 150–400)
RBC: 5.33 MIL/uL — ABNORMAL HIGH (ref 3.87–5.11)
RDW: 13.3 % (ref 11.5–15.5)
WBC: 9 10*3/uL (ref 4.0–10.5)
nRBC: 0 % (ref 0.0–0.2)

## 2023-03-17 LAB — CBC
HCT: 46.1 % — ABNORMAL HIGH (ref 36.0–46.0)
Hemoglobin: 15.3 g/dL — ABNORMAL HIGH (ref 12.0–15.0)
MCH: 32.3 pg (ref 26.0–34.0)
MCHC: 33.2 g/dL (ref 30.0–36.0)
MCV: 97.3 fL (ref 80.0–100.0)
Platelets: 168 10*3/uL (ref 150–400)
RBC: 4.74 MIL/uL (ref 3.87–5.11)
RDW: 13.2 % (ref 11.5–15.5)
WBC: 5.8 10*3/uL (ref 4.0–10.5)
nRBC: 0 % (ref 0.0–0.2)

## 2023-03-17 LAB — T4, FREE: Free T4: 1.16 ng/dL — ABNORMAL HIGH (ref 0.61–1.12)

## 2023-03-17 LAB — BASIC METABOLIC PANEL
Anion gap: 12 (ref 5–15)
BUN: 21 mg/dL (ref 8–23)
CO2: 24 mmol/L (ref 22–32)
Calcium: 9.7 mg/dL (ref 8.9–10.3)
Chloride: 100 mmol/L (ref 98–111)
Creatinine, Ser: 0.92 mg/dL (ref 0.44–1.00)
GFR, Estimated: >60 mL/min (ref >60)
Glucose, Bld: 176 mg/dL — ABNORMAL HIGH (ref 70–99)
Potassium: 4.1 mmol/L (ref 3.5–5.1)
Sodium: 136 mmol/L (ref 135–145)

## 2023-03-17 LAB — RESP PANEL BY RT-PCR (RSV, FLU A&B, COVID)  RVPGX2
Influenza A by PCR: NEGATIVE
Influenza B by PCR: NEGATIVE
Resp Syncytial Virus by PCR: NEGATIVE
SARS Coronavirus 2 by RT PCR: NEGATIVE

## 2023-03-17 LAB — TSH: TSH: 0.541 u[IU]/mL (ref 0.350–4.500)

## 2023-03-17 LAB — BRAIN NATRIURETIC PEPTIDE: B Natriuretic Peptide: 105.5 pg/mL — ABNORMAL HIGH (ref 0.0–100.0)

## 2023-03-17 MED ORDER — PREDNISONE 20 MG PO TABS: 40.0000 mg | ORAL_TABLET | Freq: Every day | ORAL | Status: DC

## 2023-03-17 MED ORDER — CEFTRIAXONE SODIUM 1 G IJ SOLR: 1.0000 g | INTRAMUSCULAR | Status: DC

## 2023-03-17 MED ORDER — IPRATROPIUM-ALBUTEROL 0.5-2.5 (3) MG/3ML IN SOLN
3.0000 mL | Freq: Four times a day (QID) | RESPIRATORY_TRACT | Status: DC
  Administered 2023-03-17 – 2023-03-18 (×3): 3 mL via RESPIRATORY_TRACT
  Filled 2023-03-17 (×2): qty 3

## 2023-03-17 MED ORDER — METHYLPREDNISOLONE SODIUM SUCC 125 MG IJ SOLR
125.0000 mg | Freq: Once | INTRAMUSCULAR | Status: AC
  Administered 2023-03-17: 125 mg via INTRAVENOUS
  Filled 2023-03-17: qty 2

## 2023-03-17 MED ORDER — SODIUM CHLORIDE 0.9 % IV SOLN
1.0000 g | Freq: Once | INTRAVENOUS | Status: AC
  Administered 2023-03-17: 1 g via INTRAVENOUS
  Filled 2023-03-17: qty 10

## 2023-03-17 MED ORDER — PANTOPRAZOLE SODIUM 40 MG IV SOLR
40.0000 mg | INTRAVENOUS | Status: DC
  Administered 2023-03-17: 40 mg via INTRAVENOUS
  Filled 2023-03-17: qty 10

## 2023-03-17 MED ORDER — ALBUTEROL SULFATE (2.5 MG/3ML) 0.083% IN NEBU
2.5000 mg | INHALATION_SOLUTION | RESPIRATORY_TRACT | Status: DC | PRN
  Administered 2023-03-18: 2.5 mg via RESPIRATORY_TRACT
  Filled 2023-03-17: qty 3

## 2023-03-17 MED ORDER — SODIUM CHLORIDE 0.9% FLUSH: 3.0000 mL | Freq: Two times a day (BID) | INTRAVENOUS | Status: DC

## 2023-03-17 MED ORDER — ENSURE ENLIVE PO LIQD
237.0000 mL | Freq: Two times a day (BID) | ORAL | Status: DC
  Administered 2023-03-18 – 2023-03-22 (×8): 237 mL via ORAL
  Filled 2023-03-17: qty 237

## 2023-03-17 MED ORDER — SODIUM CHLORIDE 0.9% FLUSH: 3.0000 mL | INTRAVENOUS | Status: DC | PRN

## 2023-03-17 MED ORDER — SODIUM CHLORIDE 0.9% FLUSH
3.0000 mL | Freq: Two times a day (BID) | INTRAVENOUS | Status: DC
  Administered 2023-03-18 – 2023-03-22 (×8): 3 mL via INTRAVENOUS

## 2023-03-17 MED ORDER — ALBUTEROL SULFATE (2.5 MG/3ML) 0.083% IN NEBU
5.0000 mg | INHALATION_SOLUTION | Freq: Once | RESPIRATORY_TRACT | Status: AC
Start: 2023-03-17 — End: 2023-03-17
  Administered 2023-03-17: 5 mg via RESPIRATORY_TRACT
  Filled 2023-03-17: qty 6

## 2023-03-17 MED ORDER — ACETAMINOPHEN 325 MG PO TABS
650.0000 mg | ORAL_TABLET | Freq: Four times a day (QID) | ORAL | Status: DC | PRN
  Administered 2023-03-18: 650 mg via ORAL
  Filled 2023-03-17: qty 2

## 2023-03-17 MED ORDER — NICOTINE 14 MG/24HR TD PT24
14.0000 mg | MEDICATED_PATCH | Freq: Every day | TRANSDERMAL | Status: DC
Start: 2023-03-17 — End: 2023-03-22
  Administered 2023-03-17 – 2023-03-21 (×5): 14 mg via TRANSDERMAL
  Filled 2023-03-17 (×5): qty 1

## 2023-03-17 MED ORDER — DEXTROSE-SODIUM CHLORIDE 5-0.9 % IV SOLN: INTRAVENOUS | Status: DC

## 2023-03-17 MED ORDER — SODIUM CHLORIDE 0.9 % IV SOLN
500.0000 mg | INTRAVENOUS | Status: AC
  Administered 2023-03-18 – 2023-03-20 (×3): 500 mg via INTRAVENOUS
  Filled 2023-03-17 (×3): qty 5

## 2023-03-17 MED ORDER — SODIUM CHLORIDE 0.9 % IV SOLN
1.0000 g | INTRAVENOUS | Status: DC
  Administered 2023-03-18 – 2023-03-21 (×4): 1 g via INTRAVENOUS
  Filled 2023-03-17 (×4): qty 10

## 2023-03-17 MED ORDER — METHYLPREDNISOLONE SODIUM SUCC 125 MG IJ SOLR
120.0000 mg | INTRAMUSCULAR | Status: AC
  Administered 2023-03-17: 120 mg via INTRAVENOUS
  Filled 2023-03-17: qty 2

## 2023-03-17 MED ORDER — HYDRALAZINE HCL 20 MG/ML IJ SOLN
5.0000 mg | INTRAMUSCULAR | Status: DC | PRN
  Administered 2023-03-17 – 2023-03-21 (×5): 5 mg via INTRAVENOUS
  Filled 2023-03-17 (×5): qty 1

## 2023-03-17 MED ORDER — GUAIFENESIN ER 600 MG PO TB12
600.0000 mg | ORAL_TABLET | Freq: Two times a day (BID) | ORAL | Status: DC | PRN
  Administered 2023-03-18: 600 mg via ORAL
  Filled 2023-03-17: qty 1

## 2023-03-17 MED ORDER — SODIUM CHLORIDE 0.9 % IV SOLN: 250.0000 mL | INTRAVENOUS | Status: AC | PRN

## 2023-03-17 MED ORDER — ACETAMINOPHEN 650 MG RE SUPP: 650.0000 mg | Freq: Four times a day (QID) | RECTAL | Status: DC | PRN

## 2023-03-17 MED ORDER — IPRATROPIUM-ALBUTEROL 0.5-2.5 (3) MG/3ML IN SOLN
3.0000 mL | Freq: Once | RESPIRATORY_TRACT | Status: AC
  Administered 2023-03-17: 3 mL via RESPIRATORY_TRACT
  Filled 2023-03-17: qty 3

## 2023-03-17 MED ORDER — SODIUM CHLORIDE 0.9 % IV SOLN
500.0000 mg | Freq: Once | INTRAVENOUS | Status: AC
  Administered 2023-03-17: 500 mg via INTRAVENOUS
  Filled 2023-03-17: qty 5

## 2023-03-17 NOTE — Assessment & Plan Note (Signed)
We will start pt on COPD protocol exacerbation.  Aspiration precaution fall precaution. Ambulating pulse ox prior to discharge. Incentive spirometry, as needed albuterol.

## 2023-03-17 NOTE — Assessment & Plan Note (Addendum)
Suspect chronic from PAD.  No complaints of leg pain. Pt is on xarelto and aspirin.  Pt needs vascular followup. Patient had ABI on July 2023 showing abnormal findings: Right: Resting right ankle-brachial index indicates severe right lower  extremity arterial disease. The right toe-brachial index is abnormal.  Left: Resting left ankle-brachial index indicates moderate left lower  extremity arterial disease. Borderline severe disease. The left  toe-brachial index is abnormal.  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 Now status post left femoral endarterectomy and profundaplasty, complete endovascular reconstruction of aortic bifurcation (CERAB) 12/03/20. This required endovascular revision 04/30/21 for mechanical compression of the iliac stenting.  options going forward include aortobifemoral bypass, with recommendations to avoid intervention as long as well.

## 2023-03-17 NOTE — Assessment & Plan Note (Signed)
Protonix 40 IV every 24.

## 2023-03-17 NOTE — Assessment & Plan Note (Signed)
Will continue patient on Crestor and Xarelto and aspirin.

## 2023-03-17 NOTE — Assessment & Plan Note (Signed)
Will continue patient on levothyroxine at 50 mcg.

## 2023-03-17 NOTE — Assessment & Plan Note (Signed)
    Latest Ref Rng & Units 03/17/2023    3:19 PM 11/21/2022    9:39 AM 08/15/2022    9:21 AM  CBC  WBC 4.0 - 10.5 K/uL 9.0  7.0  6.2   Hemoglobin 12.0 - 15.0 g/dL 16.1  09.6  04.5   Hematocrit 36.0 - 46.0 % 50.4  49.2  48.9   Platelets 150 - 400 K/uL 216  193.0  185.0   History of anemia stable and we will monitor.

## 2023-03-17 NOTE — Assessment & Plan Note (Signed)
Nicotine patient.

## 2023-03-17 NOTE — ED Provider Triage Note (Signed)
Emergency Medicine Provider Triage Evaluation Note  Jill Franco , a 89 y.o. female  was evaluated in triage.  Pt complains of difficulty breathing that started this morning.  States she has COPD.  Denies any chest pain..  Review of Systems  Positive: As above Negative: As above  Physical Exam  BP (!) 152/100 (BP Location: Right Arm)   Pulse (!) 106   Resp 15   SpO2 93%  Gen:   Awake, no distress   Resp:  Increased effort MSK:   Moves extremities without difficulty    Medical Decision Making  Medically screening exam initiated at 2:25 PM.  Appropriate orders placed.  Jill Franco was informed that the remainder of the evaluation will be completed by another provider, this initial triage assessment does not replace that evaluation, and the importance of remaining in the ED until their evaluation is complete.  Increased work of breathing, will try to get patient on room as she may need BiPAP.   Arabella Merles, PA-C 03/17/23 1425

## 2023-03-17 NOTE — ED Triage Notes (Signed)
Pt came to ED and states she can not breathe since this morning. Denies CP.  Hx of COPD. Pt took albuterol and did not help.

## 2023-03-17 NOTE — Assessment & Plan Note (Signed)
Current weight of 45.5 kg and height of 5 4. Nutrition consult and protein supplementation.

## 2023-03-17 NOTE — Telephone Encounter (Signed)
Patients states that the swelling is bad during the day, but the morning not so bad. She does prop her legs up at night. Patient has been scheduled to be seen on 03/24/23 I offered another provider but she declined.

## 2023-03-17 NOTE — ED Notes (Signed)
Pt limited to answering triage questions due to work of breathing

## 2023-03-17 NOTE — ED Provider Notes (Signed)
[place] [facility] AT [facility] Provider Note   CSN: [id number] Arrival date & time: 03/17/23  1403     History  Chief Complaint  Patient presents with   Shortness of Breath    Jill Franco is a 89 y.o. female.  Pt is a 89 yo female with pmhx significant for COPD, HLD, hypothyroidism, PVD, CVA, and HTN.  Pt said she woke up this am with sob.  She used her inhalers without improvement in sx.  Pt denies fevers.  She has a cough, but it is not productive.  She has some swelling in her legs, but that's been going on for a few months.       Home Medications Prior to Admission medications   Medication Sig Start Date End Date Taking? Authorizing Provider  acetaminophen (TYLENOL) 650 MG CR tablet Take 650 mg by mouth every 8 (eight) hours as needed for pain.    [provider]  albuterol (VENTOLIN HFA) 108 (90 Base) MCG/ACT inhaler Inhale 2 puffs into the lungs every 6 (six) hours as needed for wheezing or shortness of breath. 11/21/22   Joaquim Nam, MD  alendronate (FOSAMAX) 70 MG tablet TAKE 1 TABLET BY MOUTH WEEKLY  WITH 8 OZ OF PLAIN WATER 30  MINUTES BEFORE FIRST FOOD, DRINK OR MEDS. STAY UPRIGHT FOR 30  MINS 01/18/23   Joaquim Nam, MD  aspirin EC 81 MG tablet Take 1 tablet (81 mg total) by mouth daily. Swallow whole. 03/29/21   Cantwell, Celeste C, PA-C  budesonide-formoterol (SYMBICORT) 160-4.5 MCG/ACT inhaler Inhale 2 puffs into the lungs in the morning and at bedtime. Rinse after use. 01/01/23   Joaquim Nam, MD  Cholecalciferol (VITAMIN D3) 25 MCG (1000 UT) CAPS Take 1 capsule (1,000 Units total) by mouth daily. 10/25/20   Joaquim Nam, MD  Cyanocobalamin (VITAMIN B 12) 500 MCG TABS Take 500 mcg by mouth every Monday, Wednesday, and Friday. Patient not taking: Reported on 01/06/2023 11/23/22   Joaquim Nam, MD  diclofenac Sodium (VOLTAREN) 1 % GEL Apply 2 g topically 4 (four) times daily. Patient not taking: Reported on  01/06/2023 01/21/22   Joaquim Nam, MD  furosemide (LASIX) 20 MG tablet TAKE 1.5 TABLETS BY MOUTH IN THE  MORNING IF NEEDED FOR SWELLING 02/20/23   Joaquim Nam, MD  levothyroxine (SYNTHROID) 50 MCG tablet TAKE 1 TABLET BY MOUTH DAILY  EXCEPT 2 TABLETS ON SUNDAYS AND  WEDNESDAYS ( TOTAL 9 TABS PER  WEEK) 04/04/22   Joaquim Nam, MD  melatonin 5 MG TABS Take 5 mg by mouth at bedtime as needed (For sleep). Patient not taking: Reported on 01/06/2023    [provider]  methocarbamol (ROBAXIN) 500 MG tablet Take 1 tablet (500 mg total) by mouth 2 (two) times daily as needed for muscle spasms. 04/05/22   Joaquim Nam, MD  metoprolol tartrate (LOPRESSOR) 100 MG tablet TAKE 1/2 TABLET IN THE MORNING AND 1 TABLET IN THE  EVENING IF BP IS ABOVE 140/90.  Off med as of 11/01/22 Patient not taking: Reported on 01/06/2023 11/01/22   Joaquim Nam, MD  rosuvastatin (CRESTOR) 20 MG tablet TAKE 1 TABLET BY MOUTH DAILY 11/01/22   Joaquim Nam, MD  XARELTO 2.5 MG TABS tablet TAKE 1 TABLET BY MOUTH TWICE  DAILY 07/20/22   Joaquim Nam, MD      Allergies    Advil [ibuprofen], Aspirin, Codeine, Lipitor [atorvastatin], and Other  Review of Systems   Review of Systems  Respiratory:  Positive for cough, shortness of breath and wheezing.   All other systems reviewed and are negative.   Physical Exam Updated Vital Signs BP (!) 152/100 (BP Location: Right Arm)   Pulse (!) 106   Temp 98.7 F (37.1 C) (Oral)   Resp 15   SpO2 93%  Physical Exam Vitals and nursing note reviewed.  Constitutional:      Appearance: She is well-developed. She is ill-appearing.  HENT:     Head: Normocephalic and atraumatic.     Mouth/Throat:     Mouth: Mucous membranes are moist.     Pharynx: Oropharynx is clear.  Eyes:     Extraocular Movements: Extraocular movements intact.     Pupils: Pupils are equal, round, and reactive to light.  Cardiovascular:     Rate and Rhythm: Regular rhythm.  Tachycardia present.  Pulmonary:     Effort: Tachypnea present.     Breath sounds: Wheezing present.  Abdominal:     General: Bowel sounds are normal.     Palpations: Abdomen is soft.  Musculoskeletal:        General: Normal range of motion.     Cervical back: Normal range of motion and neck supple.     Right lower leg: Edema present.     Left lower leg: Edema present.  Skin:    General: Skin is warm.     Capillary Refill: Capillary refill takes less than 2 seconds.  Neurological:     General: No focal deficit present.     Mental Status: She is alert and oriented to person, place, and time.  Psychiatric:        Mood and Affect: Mood normal.        Behavior: Behavior normal.     ED Results / Procedures / Treatments   Labs (all labs ordered are listed, but only abnormal results are displayed) Labs Reviewed  BASIC METABOLIC PANEL - Abnormal; Notable for the following components:      Result Value   Glucose, Bld 176 (*)    All other components within normal limits  CBC WITH DIFFERENTIAL/PLATELET - Abnormal; Notable for the following components:   RBC 5.33 (*)    Hemoglobin 17.3 (*)    HCT 50.4 (*)    Neutro Abs 8.4 (*)    Lymphs Abs 0.3 (*)    All other components within normal limits  BRAIN NATRIURETIC PEPTIDE - Abnormal; Notable for the following components:   B Natriuretic Peptide 105.5 (*)    All other components within normal limits  RESP PANEL BY RT-PCR (RSV, FLU A&B, COVID)  [id number]    EKG EKG Interpretation Date/Time:  Friday March 17 2023 15:27:19 EST Ventricular Rate:  79 PR Interval:  191 QRS Duration:  78 QT Interval:  364 QTC Calculation: 418 R Axis:   81  Text Interpretation: Sinus rhythm Atrial premature complexes Right atrial enlargement Borderline right axis deviation LVH by voltage Anterior Q waves, possibly due to LVH No significant change since 11/01/21 Confirmed by Jacalyn Lefevre [phone]) on 03/17/2023 3:41:14 PM  Radiology DG Chest Port 1  View Result Date: 03/17/2023 CLINICAL DATA:  Shortness of breath EXAM: PORTABLE CHEST 1 VIEW COMPARISON:  Chest radiograph dated 12/26/2022 FINDINGS: Normal lung volumes. Biapical pleural-parenchymal scarring. No focal consolidations. Left basilar atelectasis/scarring. No pleural effusion or pneumothorax. The heart size and mediastinal contours are within normal limits. No acute osseous abnormality. IMPRESSION: No acute disease. Electronically Signed  By: Agustin Cree M.D.   On: 03/17/2023 15:49    Procedures Procedures    Medications Ordered in ED Medications  cefTRIAXone (ROCEPHIN) 1 g in sodium chloride 0.9 % 100 mL IVPB (has no administration in time range)  azithromycin (ZITHROMAX) 500 mg in sodium chloride 0.9 % 250 mL IVPB (has no administration in time range)  methylPREDNISolone sodium succinate (SOLU-MEDROL) 125 mg/2 mL injection 125 mg (125 mg Intravenous Given 03/17/23 1522)  ipratropium-albuterol (DUONEB) 0.5-2.5 (3) MG/3ML nebulizer solution 3 mL (3 mLs Nebulization Given 03/17/23 1522)  albuterol (PROVENTIL) (2.5 MG/3ML) 0.083% nebulizer solution 5 mg (5 mg Nebulization Given 03/17/23 1655)    ED Course/ Medical Decision Making/ A&P                                 Medical Decision Making Amount and/or Complexity of Data Reviewed Labs: ordered. Radiology: ordered.  Risk Prescription drug management. Decision regarding hospitalization.   This patient presents to the ED for concern of sob, this involves an extensive number of treatment options, and is a complaint that carries with it a high risk of complications and morbidity.  The differential diagnosis includes copd exac, pna, bronchitis, covid/flu/rsv, chf   Co morbidities that complicate the patient evaluation  COPD, HLD, hypothyroidism, PVD, CVA, and HTN   Additional history obtained:  Additional history obtained from epic chart review External records from outside source obtained and reviewed including  family   Lab Tests:  I Ordered, and personally interpreted labs.  The pertinent results include:  covid/flu/rsv neg, bmp nl other than glucose elevated at 176, cbc nl, bnp nl   Imaging Studies ordered:  I ordered imaging studies including cxr  I independently visualized and interpreted imaging which showed No acute disease.  I agree with the radiologist interpretation   Cardiac Monitoring:  The patient was maintained on a cardiac monitor.  I personally viewed and interpreted the cardiac monitored which showed an underlying rhythm of: st   Medicines ordered and prescription drug management:  I ordered medication including duoneb/solumedrol  for sx  Reevaluation of the patient after these medicines showed that the patient improved I have reviewed the patients home medicines and have made adjustments as needed   Test Considered:  cxr   Critical Interventions:  Nebs/steroids   Consultations Obtained:  I requested consultation with the hospitalist (Dr. Allena Katz),  and discussed lab and imaging findings as well as pertinent plan - she will admit   Problem List / ED Course:  COPD exacerbation with acute bronchitis:  pt given steroids and multiple nebs.  Breathing has improved, but she is still sob.  She is not hypoxic, but I put her on 2L to help with work of breathing.  She was ambulated to the bathroom and was very sob after walking a short distance.   Reevaluation:  After the interventions noted above, I reevaluated the patient and found that they have :improved   Social Determinants of Health:  Lives at home   Dispostion:  After consideration of the diagnostic results and the patients response to treatment, I feel that the patent would benefit from admission.          Final Clinical Impression(s) / ED Diagnoses Final diagnoses:  COPD exacerbation (HCC)  Acute bronchitis, unspecified organism    Rx / DC Orders ED Discharge Orders     None  Jacalyn Lefevre, MD 03/17/23 [phone]

## 2023-03-17 NOTE — H&P (Signed)
History and Physical    Patient: Jill Franco ZOX:[id number] DOB: 1934/12/12 DOA: 03/17/2023 DOS: the patient was seen and examined on 03/17/2023 PCP: Joaquim Nam, MD  Patient coming from: Home Chief complaint: Chief Complaint  Patient presents with   Shortness of Breath   HPI:  Jill Franco is a 89 y.o. female with past medical history  of allergies to 5 medications please see complete list of which includes Lipitor Advil aspirin codeine, COPD, hypothyroidism, GERD, history of anemia, tobacco abuse, history of leg claudication, history of CVA, pulmonary nodule, presenting today with shortness of breath.  Shortness of breath is acute onset past 1 day.  Patient has used her nebulizer without any relief, also reports bilateral leg swelling that has been going on as well.  >>ED Course: In emergency room alert awake oriented on nasal cannula cachectic appearing and hypertensive patient Vitals:   03/17/23 1700 03/17/23 1730 03/17/23 1800 03/17/23 1830  BP: (!) 161/142 (!) 178/86 (!) 189/89 (!) 181/108  Pulse: 100 82 86 88  Temp:    98.8 F (37.1 C)  Resp: 18 16 19  (!) 22  SpO2: 96% 97% 97% 97%  TempSrc:    Oral  EKG shows sinus rhythm at 79 with a PR of 191 QTc of 418, right atrial enlargement LVH. ED evaluation  so far shows: Normal metabolic panel glucose 176, BNP 105.5 CBC showing normal white count of 9 hemoglobin of 17.3 platelets of 216. Respiratory panel negative for flu RSV and COVID.   In the emergency room  pt has received the following treatment thus far: Medications  azithromycin (ZITHROMAX) 500 mg in sodium chloride 0.9 % 250 mL IVPB (500 mg Intravenous New Bag/Given 03/17/23 1918)  pantoprazole (PROTONIX) injection 40 mg (40 mg Intravenous Given 03/17/23 1844)  azithromycin (ZITHROMAX) 500 mg in sodium chloride 0.9 % 250 mL IVPB (has no administration in time range)  methylPREDNISolone sodium succinate (SOLU-MEDROL) 125 mg/2 mL injection 60 mg (has no  administration in time range)    Followed by  predniSONE (DELTASONE) tablet 40 mg (has no administration in time range)  ipratropium-albuterol (DUONEB) 0.5-2.5 (3) MG/3ML nebulizer solution 3 mL (has no administration in time range)  albuterol (PROVENTIL) (2.5 MG/3ML) 0.083% nebulizer solution 2.5 mg (has no administration in time range)  sodium chloride flush (NS) 0.9 % injection 3 mL (has no administration in time range)  acetaminophen (TYLENOL) tablet 650 mg (has no administration in time range)    Or  acetaminophen (TYLENOL) suppository 650 mg (has no administration in time range)  guaiFENesin (MUCINEX) 12 hr tablet 600 mg (has no administration in time range)  nicotine (NICODERM CQ - dosed in mg/24 hours) patch 14 mg (has no administration in time range)  hydrALAZINE (APRESOLINE) injection 5 mg (has no administration in time range)  cefTRIAXone (ROCEPHIN) 1 g in sodium chloride 0.9 % 100 mL IVPB (has no administration in time range)  sodium chloride flush (NS) 0.9 % injection 3 mL (has no administration in time range)  sodium chloride flush (NS) 0.9 % injection 3 mL (has no administration in time range)  0.9 %  sodium chloride infusion (has no administration in time range)  dextrose 5 %-0.9 % sodium chloride infusion (has no administration in time range)  feeding supplement (ENSURE ENLIVE / ENSURE PLUS) liquid 237 mL (has no administration in time range)  methylPREDNISolone sodium succinate (SOLU-MEDROL) 125 mg/2 mL injection 125 mg (125 mg Intravenous Given 03/17/23 1522)  ipratropium-albuterol (DUONEB) 0.5-2.5 (3) MG/3ML nebulizer  solution 3 mL (3 mLs Nebulization Given 03/17/23 1522)  albuterol (PROVENTIL) (2.5 MG/3ML) 0.083% nebulizer solution 5 mg (5 mg Nebulization Given 03/17/23 1655)  cefTRIAXone (ROCEPHIN) 1 g in sodium chloride 0.9 % 100 mL IVPB (0 g Intravenous Stopped 03/17/23 1915)   Review of Systems  Respiratory:  Positive for cough, shortness of breath and wheezing.    Past  Medical History:  Diagnosis Date   Cataract    bilaterally corrected.   COPD (chronic obstructive pulmonary disease) (HCC)    CVA (cerebral vascular accident) (HCC)    Pt was unaware and does not know when it happened.  Was told after some testing was done.   Double vision    Dyspnea    Facial droop 10/2017   left   History of shingles 2012   HLD (hyperlipidemia)    HTN (hypertension)    Hypothyroidism    PVD (peripheral vascular disease) (HCC)    Vision loss    temporary   Past Surgical History:  Procedure Laterality Date   ABDOMINAL AORTOGRAM N/A 04/30/2021   Procedure: ABDOMINAL AORTOGRAM;  Surgeon: Leonie Douglas, MD;  Location: [facility];  Service: Cardiovascular;  Laterality: N/A;   ABDOMINAL EXPLORATION SURGERY  1950s   "they thought I was pregnant; went in to explore"   APPENDECTOMY  childhood   BACK SURGERY     BREAST LUMPECTOMY Left 1960's   benign   carotid ultrasound  06/19/2007   0-39% stenosis   CATARACT EXTRACTION W/ INTRAOCULAR LENS IMPLANT Left 09/2016   CATARACT EXTRACTION W/ INTRAOCULAR LENS IMPLANT Right 10/2016   COLONOSCOPY WITH PROPOFOL N/A 04/08/2019   Procedure: COLONOSCOPY WITH PROPOFOL;  Surgeon: Rachael Fee, MD;  Location: [facility];  Service: Endoscopy;  Laterality: N/A;   DILATION AND CURETTAGE OF UTERUS  1960's X 3   miscarriages   DOPPLER ECHOCARDIOGRAPHY  06/19/2007   Normal EF 55-70%   ENDARTERECTOMY FEMORAL Left 12/03/2020   Procedure: LEFT FEMORAL ENDARTERECTOMY AND PROFUNDAPLASTY;  Surgeon: Leonie Douglas, MD;  Location: [facility];  Service: Vascular;  Laterality: Left;   ILIAC ARTERY STENT Left 03/18/2016   common iliac stent (6 x 40), aortic stent (10 x 19)/notes 03/18/2016   INSERTION OF ILIAC STENT Left 12/03/2020   Procedure: AORTIC STENTING, INSERTION OF BILATERAL COMMON ILIAC STENTS, AND LEFT EXTERNAL ILIAC STENT;  Surgeon: Leonie Douglas, MD;  Location: [facility];  Service: Vascular;  Laterality: Left;   LAMINECTOMY  AND MICRODISCECTOMY SPINE  1960's X 2   LOWER EXTREMITY ANGIOGRAM N/A 12/03/2020   Procedure: Emeterio Reeve;  Surgeon: Leonie Douglas, MD;  Location: [facility];  Service: Vascular;  Laterality: N/A;   MOLE REMOVAL  07/10/2016   NSVD     x1   PATCH ANGIOPLASTY Left 12/03/2020   Procedure: PATCH ANGIOPLASTY OF LEFT COMMON FEMORAL ARTERY USING BOVINE PERICARDIUM PATCH;  Surgeon: Leonie Douglas, MD;  Location: [facility];  Service: Vascular;  Laterality: Left;   PERIPHERAL VASCULAR BALLOON ANGIOPLASTY Right 04/30/2021   Procedure: PERIPHERAL VASCULAR BALLOON ANGIOPLASTY;  Surgeon: Leonie Douglas, MD;  Location: [facility];  Service: Cardiovascular;  Laterality: Right;  rt common iliac   PERIPHERAL VASCULAR CATHETERIZATION N/A 03/18/2016   Procedure: Abdominal Aortogram w/Lower Extremity;  Surgeon: Sherren Kerns, MD;  [facility]: [facility] INVASIVE CV LAB;  Service: Cardiovascular;  Laterality: N/A;   PERIPHERAL VASCULAR CATHETERIZATION  03/18/2016   Procedure: Peripheral Vascular Intervention;  Surgeon: Sherren Kerns, MD;  Location: [facility] INVASIVE  CV LAB;  Service: Cardiovascular;;   PERIPHERAL VASCULAR INTERVENTION Left 04/30/2021   Procedure: PERIPHERAL VASCULAR INTERVENTION;  Surgeon: Leonie Douglas, MD;  Location: [facility];  Service: Cardiovascular;  Laterality: Left;  lt common iliac   POLYPECTOMY  04/08/2019   Procedure: POLYPECTOMY;  Surgeon: Rachael Fee, MD;  Location: [facility];  Service: Endoscopy;;   ULTRASOUND GUIDANCE FOR VASCULAR ACCESS Right 12/03/2020   Procedure: ULTRASOUND GUIDANCE FOR VASCULAR ACCESS, RIGHT FEMORAL ARTERY;  Surgeon: Leonie Douglas, MD;  Location: [facility];  Service: Vascular;  Laterality: Right;    reports that she quit smoking about 2 years ago. Her smoking use included cigarettes. She started smoking about 71 years ago. She has a 17.3 pack-year smoking history. She has been exposed to tobacco smoke. She has never used smokeless tobacco. She  reports that she does not drink alcohol and does not use drugs.  Allergies  Allergen Reactions   Lipitor [Atorvastatin] Other (See Comments)    myalgia   Advil [Ibuprofen] Nausea And Vomiting   Aspirin Other (See Comments)    Uncoated aspirin causes stomach upset on empty stomach   Codeine Nausea Only and Other (See Comments)    Pill needs to be E.coated   Other Itching and Rash    Nicotine patches     Family History  Problem Relation Age of Onset   Stroke Father 34   Cancer Brother    Colon cancer Neg Hx    Breast cancer Neg Hx    Colon polyps Neg Hx    Esophageal cancer Neg Hx    Rectal cancer Neg Hx    Stomach cancer Neg Hx     Prior to Admission medications   Medication Sig Start Date End Date Taking? Authorizing Provider  acetaminophen (TYLENOL) 650 MG CR tablet Take 650 mg by mouth every 8 (eight) hours as needed for pain.    [provider]  albuterol (VENTOLIN HFA) 108 (90 Base) MCG/ACT inhaler Inhale 2 puffs into the lungs every 6 (six) hours as needed for wheezing or shortness of breath. 11/21/22   Joaquim Nam, MD  alendronate (FOSAMAX) 70 MG tablet TAKE 1 TABLET BY MOUTH WEEKLY  WITH 8 OZ OF PLAIN WATER 30  MINUTES BEFORE FIRST FOOD, DRINK OR MEDS. STAY UPRIGHT FOR 30  MINS 01/18/23   Joaquim Nam, MD  aspirin EC 81 MG tablet Take 1 tablet (81 mg total) by mouth daily. Swallow whole. 03/29/21   Cantwell, Celeste C, PA-C  budesonide-formoterol (SYMBICORT) 160-4.5 MCG/ACT inhaler Inhale 2 puffs into the lungs in the morning and at bedtime. Rinse after use. 01/01/23   Joaquim Nam, MD  Cholecalciferol (VITAMIN D3) 25 MCG (1000 UT) CAPS Take 1 capsule (1,000 Units total) by mouth daily. 10/25/20   Joaquim Nam, MD  Cyanocobalamin (VITAMIN B 12) 500 MCG TABS Take 500 mcg by mouth every Monday, Wednesday, and Friday. Patient not taking: Reported on 01/06/2023 11/23/22   Joaquim Nam, MD  diclofenac Sodium (VOLTAREN) 1 % GEL Apply 2 g topically 4  (four) times daily. Patient not taking: Reported on 01/06/2023 01/21/22   Joaquim Nam, MD  furosemide (LASIX) 20 MG tablet TAKE 1.5 TABLETS BY MOUTH IN THE  MORNING IF NEEDED FOR SWELLING 02/20/23   Joaquim Nam, MD  levothyroxine (SYNTHROID) 50 MCG tablet TAKE 1 TABLET BY MOUTH DAILY  EXCEPT 2 TABLETS ON SUNDAYS AND  WEDNESDAYS ( TOTAL 9 TABS PER  WEEK) 04/04/22  Joaquim Nam, MD  melatonin 5 MG TABS Take 5 mg by mouth at bedtime as needed (For sleep). Patient not taking: Reported on 01/06/2023    [provider]  methocarbamol (ROBAXIN) 500 MG tablet Take 1 tablet (500 mg total) by mouth 2 (two) times daily as needed for muscle spasms. 04/05/22   Joaquim Nam, MD  metoprolol tartrate (LOPRESSOR) 100 MG tablet TAKE 1/2 TABLET IN THE MORNING AND 1 TABLET IN THE  EVENING IF BP IS ABOVE 140/90.  Off med as of 11/01/22 Patient not taking: Reported on 01/06/2023 11/01/22   Joaquim Nam, MD  rosuvastatin (CRESTOR) 20 MG tablet TAKE 1 TABLET BY MOUTH DAILY 11/01/22   Joaquim Nam, MD  XARELTO 2.5 MG TABS tablet TAKE 1 TABLET BY MOUTH TWICE  DAILY 07/20/22   Joaquim Nam, MD   Vitals:   03/17/23 1700 03/17/23 1730 03/17/23 1800 03/17/23 1830  BP: (!) 161/142 (!) 178/86 (!) 189/89 (!) 181/108  Pulse: 100 82 86 88  Resp: 18 16 19  (!) 22  Temp:    98.8 F (37.1 C)  TempSrc:    Oral  SpO2: 96% 97% 97% 97%   Physical Exam Vitals and nursing note reviewed.  Constitutional:      General: She is not in acute distress.    Appearance: She is cachectic.     Interventions: Nasal cannula in place.  HENT:     Head: Normocephalic and atraumatic.     Right Ear: Hearing normal.     Left Ear: Hearing normal.     Nose: Nose normal. No nasal deformity.     Mouth/Throat:     Lips: Pink.     Tongue: No lesions.     Pharynx: Oropharynx is clear.  Eyes:     General: Lids are normal.     Extraocular Movements: Extraocular movements intact.  Cardiovascular:     Rate and  Rhythm: Regular rhythm. Tachycardia present.     Pulses:          Dorsalis pedis pulses are 1+ on the right side and 1+ on the left side.       Posterior tibial pulses are 1+ on the right side and 1+ on the left side.     Heart sounds: Normal heart sounds.     Comments: Decreased pedal pulses.  Pulmonary:     Effort: Pulmonary effort is normal.     Breath sounds: Examination of the right-upper field reveals wheezing. Examination of the left-upper field reveals wheezing. Examination of the right-middle field reveals wheezing. Examination of the left-middle field reveals wheezing. Examination of the right-lower field reveals wheezing. Examination of the left-lower field reveals wheezing. Wheezing present.  Abdominal:     General: Bowel sounds are normal. There is no distension.     Palpations: Abdomen is soft. There is no mass.     Tenderness: There is no abdominal tenderness.  Musculoskeletal:     Right lower leg: No edema.     Left lower leg: No edema.  Skin:    General: Skin is warm.  Neurological:     General: No focal deficit present.     Mental Status: She is alert and oriented to person, place, and time.     Cranial Nerves: Cranial nerves 2-12 are intact.  Psychiatric:        Attention and Perception: Attention normal.        Mood and Affect: Mood normal.  Speech: Speech normal.        Behavior: Behavior normal. Behavior is cooperative.      Labs on Admission: I have personally reviewed following labs and imaging studies Results for orders placed or performed during the hospital encounter of 03/17/23 (from the past 24 hours)  Basic metabolic panel     Status: Abnormal   Collection Time: 03/17/23  3:19 PM  Result Value Ref Range   Sodium 136 135 - 145 mmol/L   Potassium 4.1 3.5 - 5.1 mmol/L   Chloride 100 98 - 111 mmol/L   CO2 24 22 - 32 mmol/L   Glucose, Bld 176 (H) 70 - 99 mg/dL   BUN 21 8 - 23 mg/dL   Creatinine, Ser 8.11 0.44 - 1.00 mg/dL   Calcium 9.7 8.9 -  91.4 mg/dL   GFR, Estimated >78 >29 mL/min   Anion gap 12 5 - 15  CBC with Differential     Status: Abnormal   Collection Time: 03/17/23  3:19 PM  Result Value Ref Range   WBC 9.0 4.0 - 10.5 K/uL   RBC 5.33 (H) 3.87 - 5.11 MIL/uL   Hemoglobin 17.3 (H) 12.0 - 15.0 g/dL   HCT 56.2 (H) 13.0 - 86.5 %   MCV 94.6 80.0 - 100.0 fL   MCH 32.5 26.0 - 34.0 pg   MCHC 34.3 30.0 - 36.0 g/dL   RDW 78.4 69.6 - 29.5 %   Platelets 216 150 - 400 K/uL   nRBC 0.0 0.0 - 0.2 %   Neutrophils Relative % 93 %   Neutro Abs 8.4 (H) 1.7 - 7.7 K/uL   Lymphocytes Relative 4 %   Lymphs Abs 0.3 (L) 0.7 - 4.0 K/uL   Monocytes Relative 2 %   Monocytes Absolute 0.2 0.1 - 1.0 K/uL   Eosinophils Relative 0 %   Eosinophils Absolute 0.0 0.0 - 0.5 K/uL   Basophils Relative 1 %   Basophils Absolute 0.1 0.0 - 0.1 K/uL   Immature Granulocytes 0 %   Abs Immature Granulocytes 0.03 0.00 - 0.07 K/uL  Brain natriuretic peptide     Status: Abnormal   Collection Time: 03/17/23  3:21 PM  Result Value Ref Range   B Natriuretic Peptide 105.5 (H) 0.0 - 100.0 pg/mL  Resp panel by RT-PCR (RSV, Flu A&B, Covid) Anterior Nasal Swab     Status: None   Collection Time: 03/17/23  3:21 PM   Specimen: Anterior Nasal Swab  Result Value Ref Range   SARS Coronavirus 2 by RT PCR NEGATIVE NEGATIVE   Influenza A by PCR NEGATIVE NEGATIVE   Influenza B by PCR NEGATIVE NEGATIVE   Resp Syncytial Virus by PCR NEGATIVE NEGATIVE   Recent Results (from the past 720 hours)  Resp panel by RT-PCR (RSV, Flu A&B, Covid) Anterior Nasal Swab     Status: None   Collection Time: 03/17/23  3:21 PM   Specimen: Anterior Nasal Swab  Result Value Ref Range Status   SARS Coronavirus 2 by RT PCR NEGATIVE NEGATIVE Final   Influenza A by PCR NEGATIVE NEGATIVE Final   Influenza B by PCR NEGATIVE NEGATIVE Final    Comment: (NOTE) The Xpert Xpress SARS-CoV-2/FLU/RSV plus assay is intended as an aid in the diagnosis of influenza from Nasopharyngeal swab specimens  and should not be used as a sole basis for treatment. Nasal washings and aspirates are unacceptable for Xpert Xpress SARS-CoV-2/FLU/RSV testing.  Fact Sheet for Patients: BloggerCourse.com  Fact Sheet for Healthcare Providers: SeriousBroker.it  This test is not yet approved or cleared by the [place] and has been authorized for detection and/or diagnosis of SARS-CoV-2 by FDA under an Emergency Use Authorization (EUA). This EUA will remain in effect (meaning this test can be used) for the duration of the COVID-19 declaration under Section 564(b)(1) of the Act, 21 U.S.C. section 360bbb-3(b)(1), unless the authorization is terminated or revoked.     Resp Syncytial Virus by PCR NEGATIVE NEGATIVE Final    Comment: (NOTE) Fact Sheet for Patients: BloggerCourse.com  Fact Sheet for Healthcare Providers: SeriousBroker.it  This test is not yet approved or cleared by the [place] FDA and has been authorized for detection and/or diagnosis of SARS-CoV-2 by FDA under an Emergency Use Authorization (EUA). This EUA will remain in effect (meaning this test can be used) for the duration of the COVID-19 declaration under Section 564(b)(1) of the Act, 21 U.S.C. section 360bbb-3(b)(1), unless the authorization is terminated or revoked.  Performed at [facility], [address], [place], [place] [address]    CBC:    Latest Ref Rng & Units 03/17/2023    3:19 PM 11/21/2022    9:39 AM 08/15/2022    9:21 AM  CBC  WBC 4.0 - 10.5 K/uL 9.0  7.0  6.2   Hemoglobin 12.0 - 15.0 g/dL 60.4  54.0  98.1   Hematocrit 36.0 - 46.0 % 50.4  49.2  48.9   Platelets 150 - 400 K/uL 216  193.0  185.0    Basic Metabolic Panel: Recent Labs  Lab 03/17/23 1519  NA 136  K 4.1  CL 100  CO2 24  GLUCOSE 176*  BUN 21  CREATININE 0.92  CALCIUM 9.7   Creatinine: Lab Results  Component Value  Date   CREATININE 0.92 03/17/2023   CREATININE 0.83 05/12/2022   CREATININE 0.87 01/17/2022   Liver Function Tests:    Latest Ref Rng & Units 05/12/2022    9:05 AM 11/29/2021    9:26 AM 11/11/2021   11:21 AM  Hepatic Function  Total Protein 6.0 - 8.3 g/dL 7.2  7.3  6.6   Albumin 3.5 - 5.2 g/dL 4.3  4.0  3.2   AST 0 - 37 U/L 22  22  42   ALT 0 - 35 U/L 14  17  78   Alk Phosphatase 39 - 117 U/L 69  83  112   Total Bilirubin 0.2 - 1.2 mg/dL 0.4  0.4  0.7    Coagulation Profile: No results for input(s): "INR", "PROTIME" in the last 168 hours. Cardiac Enzymes: No results for input(s): "CKTOTAL", "CKMB", "CKMBINDEX", "TROPONINI" in the last 168 hours. BNP (last 3 results) No results for input(s): "PROBNP" in the last 8760 hours. HbA1C: No results for input(s): "HGBA1C" in the last 72 hours. Lipid Profile: No results for input(s): "CHOL", "HDL", "LDLCALC", "TRIG", "CHOLHDL", "LDLDIRECT" in the last 72 hours.  Radiological Exams on Admission: DG Chest Port 1 View Result Date: 03/17/2023 CLINICAL DATA:  Shortness of breath EXAM: PORTABLE CHEST 1 VIEW COMPARISON:  Chest radiograph dated 12/26/2022 FINDINGS: Normal lung volumes. Biapical pleural-parenchymal scarring. No focal consolidations. Left basilar atelectasis/scarring. No pleural effusion or pneumothorax. The heart size and mediastinal contours are within normal limits. No acute osseous abnormality. IMPRESSION: No acute disease. Electronically Signed   By: Agustin Cree M.D.   On: 03/17/2023 15:49    Data Reviewed: Relevant notes from primary care and specialist visits, past discharge summaries as available in EHR, including Care  Everywhere. Prior diagnostic testing as pertinent to current admission diagnoses, Updated medications and problem lists for reconciliation ED course, including vitals, labs, imaging, treatment and response to treatment,Triage notes, nursing and pharmacy notes and ED provider's notes Notable results as noted in  HPI.Discussed case with EDMD/ ED APP/ or Specialty MD on call and as needed.  >>Assessment and Plan: COPD with acute exacerbation (HCC) We will start pt on COPD protocol exacerbation.  Aspiration precaution fall precaution. Ambulating pulse ox prior to discharge. Incentive spirometry, as needed albuterol.  Hypothyroidism Will continue patient on levothyroxine at 50 mcg.  GERD without esophagitis Protonix 40 IV every 24.  Nicotine dependence, cigarettes, uncomplicated Nicotine patient.   Decreased pedal pulses Suspect chronic from PAD.  No complaints of leg pain. Pt is on xarelto and aspirin.  Pt needs vascular followup. Patient had ABI on July 2023 showing abnormal findings: Right: Resting right ankle-brachial index indicates severe right lower  extremity arterial disease. The right toe-brachial index is abnormal.  Left: Resting left ankle-brachial index indicates moderate left lower  extremity arterial disease. Borderline severe disease. The left  toe-brachial index is abnormal.  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 Now status post left femoral endarterectomy and profundaplasty, complete endovascular reconstruction of aortic bifurcation (CERAB) 12/03/20. This required endovascular revision 04/30/21 for mechanical compression of the iliac stenting.  options going forward include aortobifemoral bypass, with recommendations to avoid intervention as long as well.    HLD (hyperlipidemia) Will continue patient on Crestor and Xarelto and aspirin.  Protein-calorie malnutrition, severe (HCC) Current weight of 45.5 kg and height of 5 4. Nutrition consult and protein supplementation.  Anemia    Latest Ref Rng & Units 03/17/2023    3:19 PM 11/21/2022    9:39 AM 08/15/2022    9:21 AM  CBC  WBC 4.0 - 10.5 K/uL 9.0  7.0  6.2    Hemoglobin 12.0 - 15.0 g/dL 78.2  95.6  21.3   Hematocrit 36.0 - 46.0 % 50.4  49.2  48.9   Platelets 150 - 400 K/uL 216  193.0  185.0   History of anemia stable and we will monitor.     DVT prophylaxis:  Heparin  Consults:  None  Advance Care Planning:    Code Status: Full Code   Family Communication:  None  Disposition Plan:  Home  Severity of Illness: The appropriate patient status for this patient is OBSERVATION. Observation status is judged to be reasonable and necessary in order to provide the required intensity of service to ensure the patient's safety. The patient's presenting symptoms, physical exam findings, and initial radiographic and laboratory data in the context of their medical condition is felt to place them at decreased risk for further clinical deterioration. Furthermore, it is anticipated that the patient will be medically stable for discharge from the hospital within 2 midnights of admission.   Author: Gertha Calkin, MD 03/17/2023 8:16 PM  For on call review [email or web address].   Unresulted Labs (From admission, onward)     Start     Ordered   03/18/23 0500  Basic metabolic panel  Tomorrow morning,   R        03/17/23 2005   03/17/23 2005  CBC  Once,   R        03/17/23 2005   03/17/23 2004  TSH  Once,   R  03/17/23 2005   03/17/23 2004  T4, free  Once,   R        03/17/23 2005   Pending  CBC  (enoxaparin (LOVENOX)    CrCl >/= 30 ml/min)  Once,   R       Comments: Baseline for enoxaparin therapy IF NOT ALREADY DRAWN.  Notify MD if PLT < 100 K.    Pending   Pending  Creatinine, serum  (enoxaparin (LOVENOX)    CrCl >/= 30 ml/min)  Once,   R       Comments: Baseline for enoxaparin therapy IF NOT ALREADY DRAWN.    Pending   Pending  Creatinine, serum  (enoxaparin (LOVENOX)    CrCl >/= 30 ml/min)  Weekly,   R     Comments: while on enoxaparin therapy    Pending            Orders Placed This Encounter  Procedures   Resp panel by RT-PCR (RSV,  Flu A&B, Covid) Anterior Nasal Swab   DG Chest Port 1 View   Basic metabolic panel   CBC with Differential   Brain natriuretic peptide   TSH   T4, free   Basic metabolic panel   CBC   DIET SOFT Room service appropriate? Yes; Fluid consistency: Thin   ED Cardiac monitoring   Refer to Sidebar Report:   Apply Chronic Obstructive Pulmonary Disease Care Plan   Provide Smoking / Tobacco cessation education.  Provide education material and information on community counseling.   Cardiac Monitoring Continuous x 24 hours Indications for use: Other; other indications for use: COPD exacerbation   Maintain IV access   Vital signs   Notify physician (specify)   Mobility Protocol: No Restrictions RN to initiate protocols based on patient's level of care   Refer to Sidebar Report Refer to ICU, Med-Surg, Progressive, and Step-Down Mobility Protocol Sidebars   Initiate Adult Central Line Maintenance and Catheter Protocol for patients with central line (CVC, PICC, Port, Hemodialysis, Trialysis)   If patient diabetic or glucose greater than 140 notify physician for Sliding Scale Insulin Orders   Intake and Output   Do not place and if present remove PureWick   Initiate Oral Care Protocol   Initiate Carrier Fluid Protocol   Nurse to provide smoking / tobacco cessation education   RN may order General Admission PRN Orders utilizing "General Admission PRN medications" (through manage orders) for the following patient needs: allergy symptoms (Claritin), cold sores (Carmex), cough (Robitussin DM), eye irritation (Liquifilm Tears), hemorrhoids (Tucks), indigestion (Maalox), minor skin irritation (Hydrocortisone Cream), muscle pain Romeo Apple Gay), nose irritation (saline nasal spray) and sore throat (Chloraseptic spray).   Full code   Consult to hospitalist   Nutritional services consult   Consult to Transition of Care Team   Consult to respiratory care treatment   Pharmacy consult   Consult to Registered  Dietitian   OT eval and treat   PT eval and treat   Incentive spirometry RT   Pulse oximetry check with vital signs   Oxygen therapy Mode or (Route): Nasal cannula; Liters Per Minute: 2; Keep O2 saturation between: greater than 92 %   EKG 12-Lead   ED EKG   EKG 12-Lead   Insert peripheral IV   Place in observation (patient's expected length of stay will be less than 2 midnights)

## 2023-03-17 NOTE — Telephone Encounter (Signed)
Noted. Thanks.

## 2023-03-18 DIAGNOSIS — I10 Essential (primary) hypertension: Secondary | ICD-10-CM | POA: Diagnosis present

## 2023-03-18 DIAGNOSIS — Z888 Allergy status to other drugs, medicaments and biological substances status: Secondary | ICD-10-CM | POA: Diagnosis not present

## 2023-03-18 DIAGNOSIS — I7409 Other arterial embolism and thrombosis of abdominal aorta: Secondary | ICD-10-CM | POA: Diagnosis present

## 2023-03-18 DIAGNOSIS — R0609 Other forms of dyspnea: Secondary | ICD-10-CM | POA: Diagnosis not present

## 2023-03-18 DIAGNOSIS — J441 Chronic obstructive pulmonary disease with (acute) exacerbation: Secondary | ICD-10-CM | POA: Diagnosis not present

## 2023-03-18 DIAGNOSIS — F17210 Nicotine dependence, cigarettes, uncomplicated: Secondary | ICD-10-CM | POA: Diagnosis present

## 2023-03-18 DIAGNOSIS — Z7982 Long term (current) use of aspirin: Secondary | ICD-10-CM | POA: Diagnosis not present

## 2023-03-18 DIAGNOSIS — E785 Hyperlipidemia, unspecified: Secondary | ICD-10-CM | POA: Diagnosis present

## 2023-03-18 DIAGNOSIS — Z885 Allergy status to narcotic agent status: Secondary | ICD-10-CM | POA: Diagnosis not present

## 2023-03-18 DIAGNOSIS — Z823 Family history of stroke: Secondary | ICD-10-CM | POA: Diagnosis not present

## 2023-03-18 DIAGNOSIS — I700 Atherosclerosis of aorta: Secondary | ICD-10-CM | POA: Diagnosis not present

## 2023-03-18 DIAGNOSIS — I739 Peripheral vascular disease, unspecified: Secondary | ICD-10-CM | POA: Diagnosis present

## 2023-03-18 DIAGNOSIS — Z79890 Hormone replacement therapy (postmenopausal): Secondary | ICD-10-CM | POA: Diagnosis not present

## 2023-03-18 DIAGNOSIS — E43 Unspecified severe protein-calorie malnutrition: Secondary | ICD-10-CM | POA: Diagnosis not present

## 2023-03-18 DIAGNOSIS — K219 Gastro-esophageal reflux disease without esophagitis: Secondary | ICD-10-CM | POA: Diagnosis not present

## 2023-03-18 DIAGNOSIS — Z7983 Long term (current) use of bisphosphonates: Secondary | ICD-10-CM | POA: Diagnosis not present

## 2023-03-18 DIAGNOSIS — R0602 Shortness of breath: Secondary | ICD-10-CM | POA: Diagnosis not present

## 2023-03-18 DIAGNOSIS — D649 Anemia, unspecified: Secondary | ICD-10-CM | POA: Diagnosis present

## 2023-03-18 DIAGNOSIS — Z8673 Personal history of transient ischemic attack (TIA), and cerebral infarction without residual deficits: Secondary | ICD-10-CM | POA: Diagnosis not present

## 2023-03-18 DIAGNOSIS — Z886 Allergy status to analgesic agent status: Secondary | ICD-10-CM | POA: Diagnosis not present

## 2023-03-18 DIAGNOSIS — E039 Hypothyroidism, unspecified: Secondary | ICD-10-CM | POA: Diagnosis not present

## 2023-03-18 DIAGNOSIS — R64 Cachexia: Secondary | ICD-10-CM | POA: Diagnosis present

## 2023-03-18 DIAGNOSIS — I2510 Atherosclerotic heart disease of native coronary artery without angina pectoris: Secondary | ICD-10-CM | POA: Diagnosis not present

## 2023-03-18 DIAGNOSIS — R911 Solitary pulmonary nodule: Secondary | ICD-10-CM | POA: Diagnosis present

## 2023-03-18 DIAGNOSIS — J432 Centrilobular emphysema: Secondary | ICD-10-CM | POA: Diagnosis not present

## 2023-03-18 DIAGNOSIS — Z1152 Encounter for screening for COVID-19: Secondary | ICD-10-CM | POA: Diagnosis not present

## 2023-03-18 DIAGNOSIS — Z7901 Long term (current) use of anticoagulants: Secondary | ICD-10-CM | POA: Diagnosis not present

## 2023-03-18 DIAGNOSIS — Z79899 Other long term (current) drug therapy: Secondary | ICD-10-CM | POA: Diagnosis not present

## 2023-03-18 DIAGNOSIS — Z7951 Long term (current) use of inhaled steroids: Secondary | ICD-10-CM | POA: Diagnosis not present

## 2023-03-18 LAB — BASIC METABOLIC PANEL
Anion gap: 11 (ref 5–15)
BUN: 16 mg/dL (ref 8–23)
CO2: 24 mmol/L (ref 22–32)
Calcium: 9.1 mg/dL (ref 8.9–10.3)
Chloride: 102 mmol/L (ref 98–111)
Creatinine, Ser: 0.66 mg/dL (ref 0.44–1.00)
GFR, Estimated: >60 mL/min (ref >60)
Glucose, Bld: 158 mg/dL — ABNORMAL HIGH (ref 70–99)
Potassium: 3.7 mmol/L (ref 3.5–5.1)
Sodium: 137 mmol/L (ref 135–145)

## 2023-03-18 LAB — D-DIMER, QUANTITATIVE: D-Dimer, Quant: 6.51 ug{FEU}/mL — ABNORMAL HIGH (ref 0.00–0.50)

## 2023-03-18 LAB — PROCALCITONIN: Procalcitonin: <0.1 ng/mL

## 2023-03-18 LAB — BRAIN NATRIURETIC PEPTIDE: B Natriuretic Peptide: 429.3 pg/mL — ABNORMAL HIGH (ref 0.0–100.0)

## 2023-03-18 MED ORDER — DICLOFENAC SODIUM 1 % EX GEL
2.0000 g | Freq: Four times a day (QID) | CUTANEOUS | Status: DC
  Administered 2023-03-18 – 2023-03-22 (×15): 2 g via TOPICAL
  Filled 2023-03-18 (×2): qty 100

## 2023-03-18 MED ORDER — FUROSEMIDE 20 MG PO TABS
20.0000 mg | ORAL_TABLET | Freq: Every day | ORAL | Status: DC
  Administered 2023-03-18: 20 mg via ORAL
  Filled 2023-03-18: qty 1

## 2023-03-18 MED ORDER — PANTOPRAZOLE SODIUM 40 MG PO TBEC
40.0000 mg | DELAYED_RELEASE_TABLET | Freq: Every day | ORAL | Status: DC
  Administered 2023-03-18 – 2023-03-22 (×5): 40 mg via ORAL
  Filled 2023-03-18 (×5): qty 1

## 2023-03-18 MED ORDER — VITAMIN D 25 MCG (1000 UNIT) PO TABS
1000.0000 [IU] | ORAL_TABLET | Freq: Every day | ORAL | Status: DC
  Administered 2023-03-18 – 2023-03-22 (×5): 1000 [IU] via ORAL
  Filled 2023-03-18 (×5): qty 1

## 2023-03-18 MED ORDER — VITAMIN B-12 1000 MCG PO TABS
500.0000 ug | ORAL_TABLET | Freq: Every day | ORAL | Status: DC
Start: 2023-03-18 — End: 2023-03-22
  Administered 2023-03-18 – 2023-03-22 (×5): 500 ug via ORAL
  Filled 2023-03-18 (×2): qty 1
  Filled 2023-03-18 (×2): qty 5
  Filled 2023-03-18: qty 1
  Filled 2023-03-18: qty 5

## 2023-03-18 MED ORDER — LEVOTHYROXINE SODIUM 100 MCG PO TABS
100.0000 ug | ORAL_TABLET | ORAL | Status: DC
  Administered 2023-03-19 – 2023-03-22 (×2): 100 ug via ORAL
  Filled 2023-03-18 (×2): qty 1

## 2023-03-18 MED ORDER — ACETAMINOPHEN 500 MG PO TABS
500.0000 mg | ORAL_TABLET | Freq: Three times a day (TID) | ORAL | Status: DC | PRN
  Administered 2023-03-18 – 2023-03-22 (×3): 500 mg via ORAL
  Filled 2023-03-18 (×3): qty 1

## 2023-03-18 MED ORDER — METHOCARBAMOL 500 MG PO TABS
500.0000 mg | ORAL_TABLET | Freq: Two times a day (BID) | ORAL | Status: DC | PRN
  Administered 2023-03-19: 500 mg via ORAL
  Filled 2023-03-18: qty 1

## 2023-03-18 MED ORDER — METHYLPREDNISOLONE SODIUM SUCC 40 MG IJ SOLR
40.0000 mg | Freq: Two times a day (BID) | INTRAMUSCULAR | Status: DC
  Administered 2023-03-18 – 2023-03-20 (×6): 40 mg via INTRAVENOUS
  Filled 2023-03-18 (×5): qty 1

## 2023-03-18 MED ORDER — ASPIRIN 81 MG PO TBEC
81.0000 mg | DELAYED_RELEASE_TABLET | Freq: Every day | ORAL | Status: DC
  Administered 2023-03-18 – 2023-03-22 (×5): 81 mg via ORAL
  Filled 2023-03-18 (×5): qty 1

## 2023-03-18 MED ORDER — ALPRAZOLAM 0.25 MG PO TABS
0.2500 mg | ORAL_TABLET | Freq: Once | ORAL | Status: AC
Start: 2023-03-18 — End: 2023-03-18
  Administered 2023-03-18: 0.25 mg via ORAL
  Filled 2023-03-18: qty 1

## 2023-03-18 MED ORDER — IPRATROPIUM-ALBUTEROL 0.5-2.5 (3) MG/3ML IN SOLN
3.0000 mL | RESPIRATORY_TRACT | Status: DC | PRN
  Administered 2023-03-19 – 2023-03-21 (×3): 3 mL via RESPIRATORY_TRACT
  Filled 2023-03-18 (×3): qty 3

## 2023-03-18 MED ORDER — FUROSEMIDE 10 MG/ML IJ SOLN
40.0000 mg | Freq: Once | INTRAMUSCULAR | Status: AC
  Administered 2023-03-18: 40 mg via INTRAVENOUS
  Filled 2023-03-18: qty 4

## 2023-03-18 MED ORDER — RIVAROXABAN 2.5 MG PO TABS
2.5000 mg | ORAL_TABLET | Freq: Two times a day (BID) | ORAL | Status: DC
Start: 2023-03-18 — End: 2023-03-22
  Administered 2023-03-18 – 2023-03-22 (×9): 2.5 mg via ORAL
  Filled 2023-03-18 (×9): qty 1

## 2023-03-18 MED ORDER — ROSUVASTATIN CALCIUM 20 MG PO TABS
20.0000 mg | ORAL_TABLET | Freq: Every day | ORAL | Status: DC
  Administered 2023-03-18 – 2023-03-22 (×5): 20 mg via ORAL
  Filled 2023-03-18 (×5): qty 1

## 2023-03-18 MED ORDER — MELATONIN 5 MG PO TABS
5.0000 mg | ORAL_TABLET | Freq: Every evening | ORAL | Status: DC | PRN
  Administered 2023-03-18 – 2023-03-19 (×2): 5 mg via ORAL
  Filled 2023-03-18 (×3): qty 1

## 2023-03-18 MED ORDER — LEVOTHYROXINE SODIUM 50 MCG PO TABS: 50.0000 ug | ORAL_TABLET | Freq: Every day | ORAL | Status: DC

## 2023-03-18 MED ORDER — LEVOTHYROXINE SODIUM 50 MCG PO TABS
50.0000 ug | ORAL_TABLET | ORAL | Status: DC
  Administered 2023-03-20 – 2023-03-21 (×2): 50 ug via ORAL
  Filled 2023-03-18 (×2): qty 1

## 2023-03-18 NOTE — ED Notes (Signed)
Pt reporting SOB.

## 2023-03-18 NOTE — Progress Notes (Signed)
Patient arrives to 5W34 at this time from ED.  Patient with yellow mews upon arrival to unit

## 2023-03-18 NOTE — ED Notes (Signed)
Pt reports mildly improved breathing. Wheezing has improved, but remains.

## 2023-03-18 NOTE — Progress Notes (Signed)
PT Cancellation Note  Patient Details Name: Jill Franco MRN: [id number] DOB: Jun 26, 1934   Cancelled Treatment:    Reason Eval/Treat Not Completed: Medical issues which prohibited therapy (Pt with breathing treatment in place on arrival.  Asked nursing if pt could be seen and nurse agreed for PT to HOLD as pt struggling to breathe after 2 breathing treatments and IV needs work as well.)   Bevelyn Buckles 03/18/2023, 10:01 AM Ashlynd Michna M,PT Acute Rehab Services [phone]

## 2023-03-18 NOTE — Care Management Obs Status (Signed)
MEDICARE OBSERVATION STATUS NOTIFICATION   Patient Details  Name: Jill Franco MRN: [id number] Date of Birth: 1934-10-04   Medicare Observation Status Notification Given:  Yes    Lockie Pares, RN 03/18/2023, 11:09 AM

## 2023-03-18 NOTE — Plan of Care (Signed)

## 2023-03-18 NOTE — Care Management (Signed)
Transition of Care [facility]) - Inpatient Brief Assessment   Patient Details  Name: Jill Franco MRN: [id number] Date of Birth: 1934-07-31  Transition of Care [facility]) CM/SW Contact:    Lockie Pares, RN Phone Number: 03/18/2023, 11:13 AM   Clinical Narrative: 81 yop patient, presents with COPD [facility],  Her daughter lives with her, Her daughter is almost blind, so she needs assistance at times. Patient does not have DME or oxygen at home. She has not had Home Health previously. She drives and is normally independent.  PT will assess, awaiting recommendations.   TOC will follow   Transition of Care Asessment: Insurance and Status: Insurance coverage has been reviewed Patient has primary care physician: Yes Home environment has been reviewed: Daughter lives with her Prior level of function:: Independent Prior/Current Home Services: No current home services Social Drivers of Health Review: SDOH reviewed no interventions necessary Readmission risk has been reviewed: Yes Transition of care needs: transition of care needs identified, TOC will continue to follow

## 2023-03-18 NOTE — Progress Notes (Signed)
PROGRESS NOTE  Jill Franco:[id number] DOB: [birth year] DOA: 03/17/2023 PCP: Joaquim Nam, MD   LOS: 0 days   Brief narrative:  Jill Franco is a 89 y.o. female with past medical history of COPD, hypothyroidism, GERD, history of anemia, tobacco abuse,  leg claudication, history of CVA, pulmonary nodule, multiple medication allergies presented to hospital with shortness of breath for 1 day not improved with nebulizer with some bilateral leg swelling.  In the ED, patient was noted to be hypertensive, tachycardic with diffuse wheezing.  Initial labs were showed a normal white count.  BNP was elevated at 105.  Hemoglobin was 17.3.  Influenza COVID and RSV was negative.  CBC showed normal WBC.  TSH of 0.5.  Free T4 was 1.1. EKG showed normal sinus rhythm. Respiratory panel negative for flu RSV and COVID.  Chest x-ray showed no acute infiltrate.  Patient was then considered for admission to the hospital for further evaluation and treatment.     Assessment/Plan: Active Problems:   COPD with acute exacerbation (HCC)   Hypothyroidism   GERD without esophagitis   Nicotine dependence, cigarettes, uncomplicated   Anemia   Protein-calorie malnutrition, severe (HCC)   HLD (hyperlipidemia)   Decreased pedal pulses  COPD with acute exacerbation  Continue oxygen nebulizers incentive spirometry will change to IV Solu-Medrol, increased frequency of DuoNebs.  Patient seems to be limited.  Might need BiPAP if not improving.  Spoke with nursing staff at bedside.  Was on oral Lasix at home.  Will change to IV Lasix including 1 dose now.  Check BNP D-dimer and procalcitonin.  Check 2D echocardiogram.  No recent echocardiogram in the computer.  Hypothyroidism Continue Synthroid   GERD without esophagitis Continue Protonix   Nicotine dependence, cigarettes, uncomplicated Continue nicotine patch   Decreased pedal pulses from chronic PAD. On Xarelto and aspirin as outpatient.  ABI July 2023  was abnormal.  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 This required endovascular revision 04/30/21 for mechanical compression of the iliac stenting.  options going forward include aortobifemoral bypass, if worsening.   HLD (hyperlipidemia) Continue Crestor, Xarelto and aspirin.  DVT prophylaxis:  rivaroxaban (XARELTO) tablet 2.5 mg   Disposition: Uncertain at this time.  Pending clinical improvement.  Status is: Observation  The patient will require care spanning > 2 midnights and should be moved to inpatient because: Pending clinical improvement, dyspnea, IV steroids and antibiotic.    Code Status:     Code Status: Full Code  Family Communication: Spoke with the patient's daughter at bedside.  Consultants: None yet  Procedures: None  Anti-infectives:  Rocephin and Zithromax  Anti-infectives (From admission, onward)    Start     Dose/Rate Route Frequency Ordered Stop   03/18/23 2000  azithromycin (ZITHROMAX) 500 mg in sodium chloride 0.9 % 250 mL IVPB        500 mg 250 mL/hr over 60 Minutes Intravenous Every 24 hours 03/17/23 1924     03/18/23 1900  cefTRIAXone (ROCEPHIN) 1 g in sodium chloride 0.9 % 100 mL IVPB        1 g 200 mL/hr over 30 Minutes Intravenous Every 24 hours 03/17/23 2005 03/23/23 1859   03/18/23 1000  cefTRIAXone (ROCEPHIN) 1 g in sodium chloride 0.9 % 100 mL IVPB  Status:  Discontinued        1 g 200 mL/hr over 30 Minutes Intravenous Every 24 hours 03/17/23 1924 03/17/23 2005   03/17/23  1715  cefTRIAXone (ROCEPHIN) 1 g in sodium chloride 0.9 % 100 mL IVPB        1 g 200 mL/hr over 30 Minutes Intravenous  Once 03/17/23 1711 03/17/23 1915   03/17/23 1715  azithromycin (ZITHROMAX) 500 mg in sodium chloride 0.9 % 250 mL IVPB        500 mg 250 mL/hr over 60 Minutes Intravenous  Once 03/17/23 1711 03/17/23 2054       Subjective: Today, patient was seen and  examined at bedside.  Patient stated that she could not sleep at all yesterday.  Complains of shortness of breath even on rest.  Patient's multiple family members at bedside.  Mild cough which is dry.  Denies any chest pain  Objective: Vitals:   03/18/23 0600 03/18/23 1004  BP: (!) 151/82 (!) 151/69  Pulse: 81 (!) 108  Resp: 16 18  Temp: 98.7 F (37.1 C) 97.8 F (36.6 C)  SpO2: 97% 99%    Intake/Output Summary (Last 24 hours) at 03/18/2023 1151 Last data filed at 03/17/2023 2054 Gross per 24 hour  Intake 348.22 ml  Output --  Net 348.22 ml   There were no vitals filed for this visit. There is no height or weight on file to calculate BMI.   Physical Exam:  GENERAL: Patient is alert awake and oriented.  Thinly built, elderly female, Communicative, in mild distress HENT: No scleral pallor or icterus. Pupils equally reactive to light. Oral mucosa is moist NECK: is supple, no gross swelling noted. CHEST: Decreased breath sounds bilaterally, mild wheezes.   CVS: S1 and S2 heard, no murmur. Regular rate and rhythm.  ABDOMEN: Soft, non-tender, bowel sounds are present. EXTREMITIES: Trace edema noted. CNS: Cranial nerves are intact. No focal motor deficits. SKIN: warm and dry without rashes.  Data Review: I have personally reviewed the following laboratory data and studies,  CBC: Recent Labs  Lab 03/17/23 1519 03/17/23 2053  WBC 9.0 5.8  NEUTROABS 8.4*  --   HGB 17.3* 15.3*  HCT 50.4* 46.1*  MCV 94.6 97.3  PLT 216 168   Basic Metabolic Panel: Recent Labs  Lab 03/17/23 1519 03/18/23 0337  NA 136 137  K 4.1 3.7  CL 100 102  CO2 24 24  GLUCOSE 176* 158*  BUN 21 16  CREATININE 0.92 0.66  CALCIUM 9.7 9.1   Liver Function Tests: No results for input(s): "AST", "ALT", "ALKPHOS", "BILITOT", "PROT", "ALBUMIN" in the last 168 hours. No results for input(s): "LIPASE", "AMYLASE" in the last 168 hours. No results for input(s): "AMMONIA" in the last 168 hours. Cardiac  Enzymes: No results for input(s): "CKTOTAL", "CKMB", "CKMBINDEX", "TROPONINI" in the last 168 hours. BNP (last 3 results) Recent Labs    03/17/23 1521  BNP 105.5*    ProBNP (last 3 results) No results for input(s): "PROBNP" in the last 8760 hours.  CBG: No results for input(s): "GLUCAP" in the last 168 hours. Recent Results (from the past 240 hours)  Resp panel by RT-PCR (RSV, Flu A&B, Covid) Anterior Nasal Swab     Status: None   Collection Time: 03/17/23  3:21 PM   Specimen: Anterior Nasal Swab  Result Value Ref Range Status   SARS Coronavirus 2 by RT PCR NEGATIVE NEGATIVE Final   Influenza A by PCR NEGATIVE NEGATIVE Final   Influenza B by PCR NEGATIVE NEGATIVE Final    Comment: (NOTE) The Xpert Xpress SARS-CoV-2/FLU/RSV plus assay is intended as an aid in the diagnosis of influenza from Nasopharyngeal swab  specimens and should not be used as a sole basis for treatment. Nasal washings and aspirates are unacceptable for Xpert Xpress SARS-CoV-2/FLU/RSV testing.  Fact Sheet for Patients: BloggerCourse.com  Fact Sheet for Healthcare Providers: SeriousBroker.it  This test is not yet approved or cleared by the [place] FDA and has been authorized for detection and/or diagnosis of SARS-CoV-2 by FDA under an Emergency Use Authorization (EUA). This EUA will remain in effect (meaning this test can be used) for the duration of the COVID-19 declaration under Section 564(b)(1) of the Act, 21 U.S.C. section 360bbb-3(b)(1), unless the authorization is terminated or revoked.     Resp Syncytial Virus by PCR NEGATIVE NEGATIVE Final    Comment: (NOTE) Fact Sheet for Patients: BloggerCourse.com  Fact Sheet for Healthcare Providers: SeriousBroker.it  This test is not yet approved or cleared by the [place] FDA and has been authorized for detection and/or diagnosis of  SARS-CoV-2 by FDA under an Emergency Use Authorization (EUA). This EUA will remain in effect (meaning this test can be used) for the duration of the COVID-19 declaration under Section 564(b)(1) of the Act, 21 U.S.C. section 360bbb-3(b)(1), unless the authorization is terminated or revoked.  Performed at [facility], [address] [place], [place] [address]      Studies: DG Chest Port 1 View Result Date: 03/17/2023 CLINICAL DATA:  Shortness of breath EXAM: PORTABLE CHEST 1 VIEW COMPARISON:  Chest radiograph dated 12/26/2022 FINDINGS: Normal lung volumes. Biapical pleural-parenchymal scarring. No focal consolidations. Left basilar atelectasis/scarring. No pleural effusion or pneumothorax. The heart size and mediastinal contours are within normal limits. No acute osseous abnormality. IMPRESSION: No acute disease. Electronically Signed   By: Agustin Cree M.D.   On: 03/17/2023 15:49      Joycelyn Das, MD  [facility] 03/18/2023  If 7PM-7AM, please contact night-coverage

## 2023-03-19 ENCOUNTER — Inpatient Hospital Stay (HOSPITAL_COMMUNITY): Payer: Medicare Other

## 2023-03-19 DIAGNOSIS — J441 Chronic obstructive pulmonary disease with (acute) exacerbation: Secondary | ICD-10-CM | POA: Diagnosis not present

## 2023-03-19 DIAGNOSIS — R0609 Other forms of dyspnea: Secondary | ICD-10-CM

## 2023-03-19 LAB — BASIC METABOLIC PANEL
Anion gap: 13 (ref 5–15)
BUN: 38 mg/dL — ABNORMAL HIGH (ref 8–23)
CO2: 24 mmol/L (ref 22–32)
Calcium: 9.5 mg/dL (ref 8.9–10.3)
Chloride: 105 mmol/L (ref 98–111)
Creatinine, Ser: 0.85 mg/dL (ref 0.44–1.00)
GFR, Estimated: >60 mL/min (ref >60)
Glucose, Bld: 129 mg/dL — ABNORMAL HIGH (ref 70–99)
Potassium: 3.9 mmol/L (ref 3.5–5.1)
Sodium: 142 mmol/L (ref 135–145)

## 2023-03-19 LAB — ECHOCARDIOGRAM COMPLETE
Est EF: >75
S' Lateral: 2 cm

## 2023-03-19 LAB — CBC
HCT: 48.2 % — ABNORMAL HIGH (ref 36.0–46.0)
Hemoglobin: 16.6 g/dL — ABNORMAL HIGH (ref 12.0–15.0)
MCH: 32.4 pg (ref 26.0–34.0)
MCHC: 34.4 g/dL (ref 30.0–36.0)
MCV: 94 fL (ref 80.0–100.0)
Platelets: 195 10*3/uL (ref 150–400)
RBC: 5.13 MIL/uL — ABNORMAL HIGH (ref 3.87–5.11)
RDW: 13.2 % (ref 11.5–15.5)
WBC: 15.9 10*3/uL — ABNORMAL HIGH (ref 4.0–10.5)
nRBC: 0 % (ref 0.0–0.2)

## 2023-03-19 LAB — PHOSPHORUS: Phosphorus: 3.9 mg/dL (ref 2.5–4.6)

## 2023-03-19 LAB — MAGNESIUM: Magnesium: 2.3 mg/dL (ref 1.7–2.4)

## 2023-03-19 MED ORDER — AMLODIPINE BESYLATE 5 MG PO TABS: 5.0000 mg | ORAL_TABLET | Freq: Every day | ORAL | Status: DC

## 2023-03-19 MED ORDER — HYDRALAZINE HCL 20 MG/ML IJ SOLN
10.0000 mg | Freq: Once | INTRAMUSCULAR | Status: AC
  Administered 2023-03-19: 10 mg via INTRAVENOUS
  Filled 2023-03-19: qty 1

## 2023-03-19 MED ORDER — IOHEXOL 350 MG/ML SOLN
75.0000 mL | Freq: Once | INTRAVENOUS | Status: AC | PRN
  Administered 2023-03-19: 75 mL via INTRAVENOUS

## 2023-03-19 MED ORDER — AMLODIPINE BESYLATE 10 MG PO TABS
10.0000 mg | ORAL_TABLET | Freq: Every day | ORAL | Status: DC
  Administered 2023-03-19 – 2023-03-22 (×4): 10 mg via ORAL
  Filled 2023-03-19 (×4): qty 1

## 2023-03-19 NOTE — Progress Notes (Signed)
  Echocardiogram 2D Echocardiogram has been performed.  Delcie Roch 03/19/2023, 3:28 PM

## 2023-03-19 NOTE — Progress Notes (Signed)
OT Cancellation Note  Patient Details Name: Jill Franco MRN: [id number] DOB: 06-19-1934   Cancelled Treatment:    Reason Eval/Treat Not Completed: Other (comment) (Per RN, pt with elevated D dimer, plans to go for chest CT today. Will follow up for OT evaluation as appropriate.)  Carver Fila, OTD, OTR/L [facility] [phone]   Dorene Grebe K Koonce 03/19/2023, 11:02 AM

## 2023-03-19 NOTE — Progress Notes (Signed)
PROGRESS NOTE  Jill Franco ZOX:[id number] DOB: 1935-01-19 DOA: 03/17/2023 PCP: Joaquim Nam, MD   LOS: 1 day   Brief narrative:  Jill Franco is a 89 y.o. female with past medical history of COPD, hypothyroidism, GERD, history of anemia, tobacco abuse,  leg claudication, history of CVA, pulmonary nodule, multiple medication allergies presented to hospital with shortness of breath for 1 day not improved with nebulizer with some bilateral leg swelling.  In the ED, patient was noted to be hypertensive, tachycardic with diffuse wheezing.  Initial labs were showed a normal white count.  BNP was elevated at 105.  Hemoglobin was 17.3.  Influenza COVID and RSV was negative.  CBC showed normal WBC.  TSH of 0.5.  Free T4 was 1.1. EKG showed normal sinus rhythm. Respiratory panel negative for flu RSV and COVID.  Chest x-ray showed no acute infiltrate.  Patient was then considered for admission to the hospital for further evaluation and treatment.     Assessment/Plan: Principal Problem:   COPD exacerbation (HCC) Active Problems:   COPD with acute exacerbation (HCC)   Hypothyroidism   GERD without esophagitis   Nicotine dependence, cigarettes, uncomplicated   Anemia   Protein-calorie malnutrition, severe (HCC)   HLD (hyperlipidemia)   Decreased pedal pulses  COPD with acute exacerbation  -She is on 4 L nasal cannula, but I see no documentation of hypoxia, so we will DC her oxygen -Encouraged use incentive spirometry and flutter valve -Remains with significant wheezing this morning, I will continue with current dose steroids with no further tapering -Continue with scheduled DuoNebs -Continue with Lasix as needed, BNP is mildly elevated, 2D echo is pending -CTA chest has been obtained today given elevated D-dimers, no evidence of PE -Continue with IV antibiotics given productive cough  Hypothyroidism Continue Synthroid   GERD without esophagitis Continue Protonix   Nicotine  dependence, cigarettes, uncomplicated She was counseled Continue nicotine patch   Decreased pedal pulses from chronic PAD. On Xarelto and aspirin as outpatient.  ABI July 2023 was abnormal.  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 This required endovascular revision 04/30/21 for mechanical compression of the iliac stenting.  options going forward include aortobifemoral bypass, if worsening.   HLD (hyperlipidemia) Continue Crestor, Xarelto and aspirin.  DVT prophylaxis:  rivaroxaban (XARELTO) tablet 2.5 mg   Disposition: Uncertain at this time.  Pending clinical improvement.  Status is: Observation  The patient will require care spanning > 2 midnights and should be moved to inpatient because: Pending clinical improvement, dyspnea, IV steroids and antibiotic.    Code Status:     Code Status: Full Code  Family Communication: None at bedside  Consultants: None yet  Procedures: None  Anti-infectives:  Rocephin and Zithromax  Anti-infectives (From admission, onward)    Start     Dose/Rate Route Frequency Ordered Stop   03/18/23 2000  azithromycin (ZITHROMAX) 500 mg in sodium chloride 0.9 % 250 mL IVPB        500 mg 250 mL/hr over 60 Minutes Intravenous Every 24 hours 03/17/23 1924 03/21/23 1959   03/18/23 1900  cefTRIAXone (ROCEPHIN) 1 g in sodium chloride 0.9 % 100 mL IVPB        1 g 200 mL/hr over 30 Minutes Intravenous Every 24 hours 03/17/23 2005 03/23/23 1859   03/18/23 1000  cefTRIAXone (ROCEPHIN) 1 g in sodium chloride 0.9 % 100 mL IVPB  Status:  Discontinued  1 g 200 mL/hr over 30 Minutes Intravenous Every 24 hours 03/17/23 1924 03/17/23 2005   03/17/23 1715  cefTRIAXone (ROCEPHIN) 1 g in sodium chloride 0.9 % 100 mL IVPB        1 g 200 mL/hr over 30 Minutes Intravenous  Once 03/17/23 1711 03/17/23 1915   03/17/23 1715  azithromycin (ZITHROMAX) 500 mg in sodium  chloride 0.9 % 250 mL IVPB        500 mg 250 mL/hr over 60 Minutes Intravenous  Once 03/17/23 1711 03/17/23 2054       Subjective:  No chest pain, reports dyspnea at baseline, still reports cough, congestion   Objective: Vitals:   03/19/23 0600 03/19/23 0700  BP: (!) 211/83 (!) 147/76  Pulse: 90 87  Resp: (!) 28 (!) 22  Temp:    SpO2: 99% 98%    Intake/Output Summary (Last 24 hours) at 03/19/2023 1446 Last data filed at 03/19/2023 0958 Gross per 24 hour  Intake 593 ml  Output --  Net 593 ml   There were no vitals filed for this visit. There is no height or weight on file to calculate BMI.   Physical Exam:  Awake Alert, Oriented X 3, trembly thin female, laying in bed, chronically ill-appearing Symmetrical Chest wall movement, diminished air entry with diffuse wheezing RRR,No Gallops,Rubs or new Murmurs, No Parasternal Heave +ve B.Sounds, Abd Soft, No tenderness, No rebound - guarding or rigidity. No Cyanosis, Clubbing or edema, No new Rash or bruise    Data Review: I have personally reviewed the following laboratory data and studies,  CBC: Recent Labs  Lab 03/17/23 1519 03/17/23 2053 03/19/23 0748  WBC 9.0 5.8 15.9*  NEUTROABS 8.4*  --   --   HGB 17.3* 15.3* 16.6*  HCT 50.4* 46.1* 48.2*  MCV 94.6 97.3 94.0  PLT 216 168 195   Basic Metabolic Panel: Recent Labs  Lab 03/17/23 1519 03/18/23 0337 03/19/23 0748  NA 136 137 142  K 4.1 3.7 3.9  CL 100 102 105  CO2 24 24 24   GLUCOSE 176* 158* 129*  BUN 21 16 38*  CREATININE 0.92 0.66 0.85  CALCIUM 9.7 9.1 9.5  MG  --   --  2.3  PHOS  --   --  3.9   Liver Function Tests: No results for input(s): "AST", "ALT", "ALKPHOS", "BILITOT", "PROT", "ALBUMIN" in the last 168 hours. No results for input(s): "LIPASE", "AMYLASE" in the last 168 hours. No results for input(s): "AMMONIA" in the last 168 hours. Cardiac Enzymes: No results for input(s): "CKTOTAL", "CKMB", "CKMBINDEX", "TROPONINI" in the last 168  hours. BNP (last 3 results) Recent Labs    03/17/23 1521 03/18/23 1758  BNP 105.5* 429.3*    ProBNP (last 3 results) No results for input(s): "PROBNP" in the last 8760 hours.  CBG: No results for input(s): "GLUCAP" in the last 168 hours. Recent Results (from the past 240 hours)  Resp panel by RT-PCR (RSV, Flu A&B, Covid) Anterior Nasal Swab     Status: None   Collection Time: 03/17/23  3:21 PM   Specimen: Anterior Nasal Swab  Result Value Ref Range Status   SARS Coronavirus 2 by RT PCR NEGATIVE NEGATIVE Final   Influenza A by PCR NEGATIVE NEGATIVE Final   Influenza B by PCR NEGATIVE NEGATIVE Final    Comment: (NOTE) The Xpert Xpress SARS-CoV-2/FLU/RSV plus assay is intended as an aid in the diagnosis of influenza from Nasopharyngeal swab specimens and should not be used as a sole  basis for treatment. Nasal washings and aspirates are unacceptable for Xpert Xpress SARS-CoV-2/FLU/RSV testing.  Fact Sheet for Patients: BloggerCourse.com  Fact Sheet for Healthcare Providers: SeriousBroker.it  This test is not yet approved or cleared by the [place] FDA and has been authorized for detection and/or diagnosis of SARS-CoV-2 by FDA under an Emergency Use Authorization (EUA). This EUA will remain in effect (meaning this test can be used) for the duration of the COVID-19 declaration under Section 564(b)(1) of the Act, 21 U.S.C. section 360bbb-3(b)(1), unless the authorization is terminated or revoked.     Resp Syncytial Virus by PCR NEGATIVE NEGATIVE Final    Comment: (NOTE) Fact Sheet for Patients: BloggerCourse.com  Fact Sheet for Healthcare Providers: SeriousBroker.it  This test is not yet approved or cleared by the [place] FDA and has been authorized for detection and/or diagnosis of SARS-CoV-2 by FDA under an Emergency Use Authorization (EUA). This EUA will  remain in effect (meaning this test can be used) for the duration of the COVID-19 declaration under Section 564(b)(1) of the Act, 21 U.S.C. section 360bbb-3(b)(1), unless the authorization is terminated or revoked.  Performed at [facility], [address], [place], [place] [address]      Studies: CT Angio Chest Pulmonary Embolism (PE) W or WO Contrast Result Date: 03/19/2023 CLINICAL DATA:  89 year old female with recent shortness of breath. Abnormal D-dimer. EXAM: CT ANGIOGRAPHY CHEST WITH CONTRAST TECHNIQUE: Multidetector CT imaging of the chest was performed using the standard protocol during bolus administration of intravenous contrast. Multiplanar CT image reconstructions and MIPs were obtained to evaluate the vascular anatomy. RADIATION DOSE REDUCTION: This exam was performed according to the departmental dose-optimization program which includes automated exposure control, adjustment of the mA and/or kV according to patient size and/or use of iterative reconstruction technique. CONTRAST:  75mL OMNIPAQUE IOHEXOL 350 MG/ML SOLN COMPARISON:  CTA chest 06/05/2020.  Portable chest 03/17/2023. FINDINGS: Cardiovascular: Excellent contrast bolus timing in the pulmonary arterial tree. No pulmonary artery filling defect. Extensive Calcified aortic atherosclerosis. Extensive calcified coronary artery atherosclerosis. Heart size is stable since 2022, within normal limits. No pericardial effusion. Negative for thoracic aortic aneurysm or dissection. Mediastinum/Nodes: Negative. No mediastinal mass or lymphadenopathy. Lungs/Pleura: Centrilobular emphysema. Chronic apical lung scarring is stable. Pulmonary nodule in the left lower lobe left lower lobe series 6, image 109 is stable from a chest CT 10/18/2019 and benign. No pleural effusion. No active pulmonary inflammation identified. Upper Abdomen: Mild contrast reflux into the hepatic veins and IVC. Otherwise negative visible mostly noncontrast liver,  spleen, pancreas, kidneys. Chronic adrenal gland thickening compatible with hyperplasia is stable. Grossly negative visible bowel. Musculoskeletal: Stable.  Osteopenia. Review of the MIP images confirms the above findings. IMPRESSION: 1. Negative for pulmonary embolus. No acute finding identified in the chest. 2. Aortic Atherosclerosis (ICD10-I70.0) and Emphysema (ICD10-J43.9). Electronically Signed   By: Odessa Fleming M.D.   On: 03/19/2023 12:06   DG Chest Port 1 View Result Date: 03/17/2023 CLINICAL DATA:  Shortness of breath EXAM: PORTABLE CHEST 1 VIEW COMPARISON:  Chest radiograph dated 12/26/2022 FINDINGS: Normal lung volumes. Biapical pleural-parenchymal scarring. No focal consolidations. Left basilar atelectasis/scarring. No pleural effusion or pneumothorax. The heart size and mediastinal contours are within normal limits. No acute osseous abnormality. IMPRESSION: No acute disease. Electronically Signed   By: Agustin Cree M.D.   On: 03/17/2023 15:49      Huey Bienenstock, MD  [facility] 03/19/2023  If 7PM-7AM, please contact night-coverage

## 2023-03-19 NOTE — Evaluation (Signed)
Physical Therapy Evaluation Patient Details Name: Jill Franco MRN: [id number] DOB: 03-Mar-1934 Today's Date: 03/19/2023  History of Present Illness  Jill Franco is a 89 y.o. female with past medical history  of allergies to Lipitor Advil aspirin codeine, COPD, hypothyroidism, GERD, history of anemia, tobacco abuse, history of leg claudication, history of CVA, pulmonary nodule, presenting today with shortness of breath.  Clinical Impression   Pt admitted with above diagnosis. 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 Pt will benefit from skilled PT to increase their independence and safety with mobility to allow discharge to the venue listed below.       See other PT note of this date for O2 modified O2 qualifying note      If plan is discharge home, recommend the following: Assistance with cooking/housework   Can travel by Nurse, mental health (4 wheels);Other (comment) (A bit too early to make a solid rec, but will consider supplemental O2 and  a rollator)  Recommendations for Other Services       Functional Status Assessment Patient has had a recent decline in their functional status and demonstrates the ability to make significant improvements in function in a reasonable and predictable  amount of time.     Precautions / Restrictions Precautions Precautions: Other (comment) Precaution Comments: Monitor for DOE; desats on room air Restrictions Weight Bearing Restrictions Per Provider Order: No      Mobility  Bed Mobility Overal bed mobility: Needs Assistance Bed Mobility: Supine to Sit, Sit to Supine     Supine to sit: Supervision Sit to supine: Min assist   General bed mobility comments: Smotth motion to come to sitting EOB; very light min assist to help LEs back into bed after standing    Transfers Overall transfer level: Needs assistance Equipment used: 2 person hand held assist Transfers: Sit to/from Stand Sit to Stand: Min assist           General transfer comment: Min assist to steady; stood for less tahn 10 seconds before she needed to sit back down    Ambulation/Gait               General Gait Details: Deferred due to decr functional capacity  Stairs            Wheelchair Mobility     Tilt Bed    Modified Rankin (Stroke Patients Only)       Balance                                             Pertinent Vitals/Pain Pain Assessment Pain Assessment: No/denies pain    Home Living Family/patient expects to be discharged to:: Private  residence Living Arrangements: Children Available Help at Discharge: Family Type of Home: House Home Access: Ramped entrance       Home Layout: One level Home Equipment: Crutches;Toilet riser;Wheelchair - Forensic psychologist (2 wheels) Additional Comments: Daughter lives with her; daughter is legally blind, and pt takes care of her daughter    Prior Function Prior Level of Function : Independent/Modified Independent             Mobility Comments: typically does not use Education administrator; Household and short distance ambulator; for example, drives to the store, and sits and waits for her daughter to do the shopping       Extremity/Trunk Assessment   Upper  Extremity Assessment Upper Extremity Assessment: Generalized weakness    Lower Extremity Assessment Lower Extremity Assessment: Generalized weakness (Adequate short-term power to push to stand; very little muscular endurance, and had to sit back down within 10 seconds)       Communication   Communication Communication: Hearing impairment  Cognition Arousal: Alert Behavior During Therapy: WFL for tasks assessed/performed Overall Cognitive Status: Within Functional Limits for tasks assessed                                          General Comments General comments (skin integrity, edema, etc.): Initiated teaching pursed lip breathing; See other PT note of this date for O2 qualifying    Exercises     Assessment/Plan    PT Assessment Patient needs continued PT services  PT Problem List Decreased strength;Decreased activity tolerance;Decreased balance;Decreased knowledge of use of DME;Cardiopulmonary status limiting activity;Decreased knowledge of precautions;Other (comment) (decr functional capacity)       PT Treatment Interventions DME instruction;Gait training;Functional mobility training;Therapeutic activities;Therapeutic exercise;Stair training;Balance training;Patient/family education    PT Goals (Current goals can be found in the Care Plan section)  Acute Rehab PT Goals Patient Stated Goal: wants to be able to get home PT Goal Formulation: With patient Time For Goal Achievement: 04/02/23 Potential to Achieve Goals: Good    Frequency Min 1X/week     Co-evaluation               AM-PAC PT "6 Clicks" Mobility  Outcome Measure Help needed turning from your back to your side while in a flat bed without using bedrails?: None Help needed moving from lying on your back to sitting on the side of a flat bed without using bedrails?: None Help needed moving to and from a bed to a chair (including a wheelchair)?: A Little Help needed standing up from a  chair using your arms (e.g., wheelchair or bedside chair)?: A Little Help needed to walk in hospital room?: A Lot Help needed climbing 3-5 steps with a railing? : A Lot 6 Click Score: 18    End of Session Equipment Utilized During Treatment: Oxygen Activity Tolerance: Patient limited by fatigue;Other (comment) (limited by decr functional capacity) Patient left: in bed;with call bell/phone within reach Nurse Communication: Mobility status PT Visit Diagnosis: Unsteadiness on feet (R26.81);Other (comment) (decr functional endurance)    Time: 4098-1191 PT Time Calculation (min) (ACUTE ONLY): 15 min   Charges:   PT Evaluation $PT Eval Moderate Complexity: 1 Mod   PT General Charges $$ ACUTE PT VISIT: 1 Visit         Van Clines, PT  Acute Rehabilitation Services Office [phone] Secure Chat welcomed   Levi Aland 03/19/2023,  9:28 AM

## 2023-03-19 NOTE — Progress Notes (Signed)
Physical Therapy  Note  (Full PT eval to follow)  SATURATION QUALIFICATIONS: (This note is used to comply with regulatory documentation for home oxygen)  Patient Saturations on Room Air at Rest = 89%  Patient Saturations on Room Air while Moving = 87%  Patient Saturations on 2 Liters of oxygen while Moving = 92%  Please briefly explain why patient needs home oxygen: Patient requires supplemental oxygen to maintain oxygen saturations at acceptable, safe levels with physical activity.   Van Clines, PT  Acute Rehabilitation Services Office [phone] Secure Chat welcomed

## 2023-03-19 NOTE — Progress Notes (Signed)
[facility] night cross cover note:   I was notified by RN that this patient remains hypertensive after dose of prn IV hydralazine via existing order, with most recent blood pressure noted to be 211/83.  Patient without any acute symptoms or acute focal neurologic deficits associate with this elevated blood pressure.   Brief chart review, it appears that she is here with acute COPD exacerbation, and has had recurrently elevated blood pressure during her hospital course, with blood pressures in the range of 150s to low 200s.  Most recent heart rates in the 70s to 90s.  Not currently on any scheduled antihypertensive medications  I have ordered Norvasc 5 mg p.o. daily, as well as a dose of hydralazine 10 mg IV x 1 now leading up to initiation of the Norvasc.     Newton Pigg, DO Hospitalist

## 2023-03-19 NOTE — Plan of Care (Signed)

## 2023-03-19 NOTE — Progress Notes (Signed)
Initial Nutrition Assessment  DOCUMENTATION CODES:   Underweight  INTERVENTION:  Liberalize diet Continue Ensure Plus High Protein po BID, each supplement provides 350 kcal and 20 grams of protein. Multivitamin + minerals   NUTRITION DIAGNOSIS:   Increased nutrient needs related to chronic illness as evidenced by estimated needs.    GOAL:   Patient will meet greater than or equal to 90% of their needs    MONITOR:   PO intake, Supplement acceptance  REASON FOR ASSESSMENT:   Consult Assessment of nutrition requirement/status, Calorie Count  ASSESSMENT:    89 y.o. F, presented to ED from home with complaints of, increased SOB, leg swelling or the last few months. Admitted with COPD exacerbation with acute bronchitis. PMH: COPD, HLD, hypothyroidism, HTN. Review of EMR revealed; Patient resides at home. Unable to speak with this patient on this day, unable to get good nutritional history. No recent BM noted. Independent feeding ability. Unable to determine weight loss at this time due to last recorded weight is from 12/26/2022.  This is the weight used for estimated nutritional needs. Heart diet pending; There is no benefit to excessive dietary restrictions related advanced age, increased nutrient needs. Patient would benefit better from liberalized diet to help meet increased nutrient needs and promote better oral intake.  Admit weight: 45.5 kg (12/26/2022) Weight history:  12/26/22 45.5 kg  11/01/22 44.5 kg  09/27/22 45 kg  08/24/22 44.5 kg  05/23/22 47.2 kg  11/11/21 49.4 kg  11/01/21 48.5 kg  10/28/21 48.7 kg  09/28/21 49.9 kg  09/22/21 50.1 kg    Average Meal Intake: No current documentation.   Nutritionally Relevant Medications: Scheduled Meds:  amLODipine  10 mg Oral Daily   diclofenac Sodium  2 g Topical QID   feeding supplement  237 mL Oral BID BM   nicotine  14 mg Transdermal Daily   vitamin B-12  500 mcg Oral Daily    Labs Reviewed     NUTRITION - FOCUSED PHYSICAL EXAM:  Deferred   Diet Order:   Diet Order             Diet regular Room service appropriate? Yes with Assist; Fluid consistency: Thin  Diet effective now                   EDUCATION NEEDS:   Education needs have been addressed  Skin:  Skin Assessment: Reviewed RN Assessment  Last BM:  PTA  Height:   Ht Readings from Last 1 Encounters:  12/26/22 5\' 4"  (1.626 m)    Weight:   Wt Readings from Last 1 Encounters:  12/26/22 45.5 kg    Ideal Body Weight:     BMI:  There is no height or weight on file to calculate BMI.  Estimated Nutritional Needs:   Kcal:  1400-1600 kcal  Protein:  60-75 g  Fluid:  +/= 1.5 L/day    Jamelle Haring RDN, LDN Clinical Dietitian   If unable to reach, please contact "[facility] Inpatient" secure chat group between 8 am-4 pm daily"

## 2023-03-20 DIAGNOSIS — J441 Chronic obstructive pulmonary disease with (acute) exacerbation: Secondary | ICD-10-CM | POA: Diagnosis not present

## 2023-03-20 LAB — CBC
HCT: 47 % — ABNORMAL HIGH (ref 36.0–46.0)
Hemoglobin: 15.7 g/dL — ABNORMAL HIGH (ref 12.0–15.0)
MCH: 32 pg (ref 26.0–34.0)
MCHC: 33.4 g/dL (ref 30.0–36.0)
MCV: 95.9 fL (ref 80.0–100.0)
Platelets: 180 10*3/uL (ref 150–400)
RBC: 4.9 MIL/uL (ref 3.87–5.11)
RDW: 13.5 % (ref 11.5–15.5)
WBC: 10.4 10*3/uL (ref 4.0–10.5)
nRBC: 0 % (ref 0.0–0.2)

## 2023-03-20 LAB — MAGNESIUM: Magnesium: 2.3 mg/dL (ref 1.7–2.4)

## 2023-03-20 LAB — BASIC METABOLIC PANEL
Anion gap: 12 (ref 5–15)
BUN: 33 mg/dL — ABNORMAL HIGH (ref 8–23)
CO2: 26 mmol/L (ref 22–32)
Calcium: 8.9 mg/dL (ref 8.9–10.3)
Chloride: 103 mmol/L (ref 98–111)
Creatinine, Ser: 0.83 mg/dL (ref 0.44–1.00)
GFR, Estimated: >60 mL/min (ref >60)
Glucose, Bld: 139 mg/dL — ABNORMAL HIGH (ref 70–99)
Potassium: 3.9 mmol/L (ref 3.5–5.1)
Sodium: 141 mmol/L (ref 135–145)

## 2023-03-20 LAB — BRAIN NATRIURETIC PEPTIDE: B Natriuretic Peptide: 755.2 pg/mL — ABNORMAL HIGH (ref 0.0–100.0)

## 2023-03-20 LAB — PHOSPHORUS: Phosphorus: 3.6 mg/dL (ref 2.5–4.6)

## 2023-03-20 MED ORDER — FUROSEMIDE 10 MG/ML IJ SOLN
20.0000 mg | Freq: Once | INTRAMUSCULAR | Status: AC
  Administered 2023-03-20: 20 mg via INTRAVENOUS
  Filled 2023-03-20: qty 2

## 2023-03-20 MED ORDER — SALINE SPRAY 0.65 % NA SOLN: 1.0000 | NASAL | Status: DC | PRN

## 2023-03-20 NOTE — Plan of Care (Signed)

## 2023-03-20 NOTE — Progress Notes (Signed)
PROGRESS NOTE  Jill Franco GNF:[id number] DOB: 03-03-1934 DOA: 03/17/2023 PCP: Joaquim Nam, MD   LOS: 2 days   Brief narrative:  Jill Franco is a 89 y.o. female with past medical history of COPD, hypothyroidism, GERD, history of anemia, tobacco abuse,  leg claudication, history of CVA, pulmonary nodule, multiple medication allergies presented to hospital with shortness of breath for 1 day not improved with nebulizer with some bilateral leg swelling.  In the ED, patient was noted to be hypertensive, tachycardic with diffuse wheezing.  Initial labs were showed a normal white count.  BNP was elevated at 105.  Hemoglobin was 17.3.  Influenza COVID and RSV was negative.  CBC showed normal WBC.  TSH of 0.5.  Free T4 was 1.1. EKG showed normal sinus rhythm. Respiratory panel negative for flu RSV and COVID.  Chest x-ray showed no acute infiltrate.  Patient was then considered for admission to the hospital for further evaluation and treatment.     Assessment/Plan: Principal Problem:   COPD exacerbation (HCC) Active Problems:   COPD with acute exacerbation (HCC)   Hypothyroidism   GERD without esophagitis   Nicotine dependence, cigarettes, uncomplicated   Anemia   Protein-calorie malnutrition, severe (HCC)   HLD (hyperlipidemia)   Decreased pedal pulses  COPD with acute exacerbation  -On 2 L nasal cannula currently, it does appear he desaturated in the 70s room air with physical therapy, -Encouraged use incentive spirometry and flutter valve -Remains with significant wheezing this morning, I will continue with current dose steroids with no further tapering -Continue with scheduled DuoNebs -Continue with Lasix as needed, BNP is mildly elevated, 2D echo is pending -CTA chest has been obtained today given elevated D-dimers, no evidence of PE -Continue with IV antibiotics given productive cough  Hypothyroidism Continue Synthroid   GERD without esophagitis Continue Protonix    Nicotine dependence, cigarettes, uncomplicated She was counseled Continue nicotine patch   Decreased pedal pulses from chronic PAD. On Xarelto and aspirin as outpatient.  ABI July 2023 was abnormal.  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 This required endovascular revision 04/30/21 for mechanical compression of the iliac stenting.  options going forward include aortobifemoral bypass, if worsening.   HLD (hyperlipidemia) Continue Crestor, Xarelto and aspirin.  DVT prophylaxis:  rivaroxaban (XARELTO) tablet 2.5 mg   Disposition:, Home   Code Status:     Code Status: Full Code  Family Communication: None at bedside  Consultants: None yet  Procedures: None  Anti-infectives:  Rocephin and Zithromax  Anti-infectives (From admission, onward)    Start     Dose/Rate Route Frequency Ordered Stop   03/18/23 2000  azithromycin (ZITHROMAX) 500 mg in sodium chloride 0.9 % 250 mL IVPB        500 mg 250 mL/hr over 60 Minutes Intravenous Every 24 hours 03/17/23 1924 03/21/23 1959   03/18/23 1900  cefTRIAXone (ROCEPHIN) 1 g in sodium chloride 0.9 % 100 mL IVPB        1 g 200 mL/hr over 30 Minutes Intravenous Every 24 hours 03/17/23 2005 03/23/23 1859   03/18/23 1000  cefTRIAXone (ROCEPHIN) 1 g in sodium chloride 0.9 % 100 mL IVPB  Status:  Discontinued        1 g 200 mL/hr over 30 Minutes Intravenous Every 24 hours 03/17/23 1924 03/17/23 2005   03/17/23 1715  cefTRIAXone (ROCEPHIN) 1 g in sodium chloride 0.9 % 100 mL IVPB  1 g 200 mL/hr over 30 Minutes Intravenous  Once 03/17/23 1711 03/17/23 1915   03/17/23 1715  azithromycin (ZITHROMAX) 500 mg in sodium chloride 0.9 % 250 mL IVPB        500 mg 250 mL/hr over 60 Minutes Intravenous  Once 03/17/23 1711 03/17/23 2054       Subjective:  No chest pain, no shortness of breath, reports she is feeling better  today   Objective: Vitals:   03/20/23 1520 03/20/23 1525  BP:    Pulse:    Resp:    Temp:    SpO2: 90% 91%    Intake/Output Summary (Last 24 hours) at 03/20/2023 1550 Last data filed at 03/20/2023 1330 Gross per 24 hour  Intake 723 ml  Output 1150 ml  Net -427 ml   There were no vitals filed for this visit. There is no height or weight on file to calculate BMI.   Physical Exam:  Awake Alert, Oriented X 3, frail, deconditioned, chronically ill-appearing Symmetrical Chest wall movement, she is with significant wheezing bilaterally, but has improved air entry RRR,No Gallops,Rubs or new Murmurs, No Parasternal Heave +ve B.Sounds, Abd Soft, No tenderness, No rebound - guarding or rigidity. No Cyanosis, Clubbing or edema, No new Rash or bruise     Data Review: I have personally reviewed the following laboratory data and studies,  CBC: Recent Labs  Lab 03/17/23 1519 03/17/23 2053 03/19/23 0748 03/20/23 0503  WBC 9.0 5.8 15.9* 10.4  NEUTROABS 8.4*  --   --   --   HGB 17.3* 15.3* 16.6* 15.7*  HCT 50.4* 46.1* 48.2* 47.0*  MCV 94.6 97.3 94.0 95.9  PLT 216 168 195 180   Basic Metabolic Panel: Recent Labs  Lab 03/17/23 1519 03/18/23 0337 03/19/23 0748 03/20/23 0503  NA 136 137 142 141  K 4.1 3.7 3.9 3.9  CL 100 102 105 103  CO2 24 24 24 26   GLUCOSE 176* 158* 129* 139*  BUN 21 16 38* 33*  CREATININE 0.92 0.66 0.85 0.83  CALCIUM 9.7 9.1 9.5 8.9  MG  --   --  2.3 2.3  PHOS  --   --  3.9 3.6   Liver Function Tests: No results for input(s): "AST", "ALT", "ALKPHOS", "BILITOT", "PROT", "ALBUMIN" in the last 168 hours. No results for input(s): "LIPASE", "AMYLASE" in the last 168 hours. No results for input(s): "AMMONIA" in the last 168 hours. Cardiac Enzymes: No results for input(s): "CKTOTAL", "CKMB", "CKMBINDEX", "TROPONINI" in the last 168 hours. BNP (last 3 results) Recent Labs    03/17/23 1521 03/18/23 1758 03/20/23 0503  BNP 105.5* 429.3* 755.2*     ProBNP (last 3 results) No results for input(s): "PROBNP" in the last 8760 hours.  CBG: No results for input(s): "GLUCAP" in the last 168 hours. Recent Results (from the past 240 hours)  Resp panel by RT-PCR (RSV, Flu A&B, Covid) Anterior Nasal Swab     Status: None   Collection Time: 03/17/23  3:21 PM   Specimen: Anterior Nasal Swab  Result Value Ref Range Status   SARS Coronavirus 2 by RT PCR NEGATIVE NEGATIVE Final   Influenza A by PCR NEGATIVE NEGATIVE Final   Influenza B by PCR NEGATIVE NEGATIVE Final    Comment: (NOTE) The Xpert Xpress SARS-CoV-2/FLU/RSV plus assay is intended as an aid in the diagnosis of influenza from Nasopharyngeal swab specimens and should not be used as a sole basis for treatment. Nasal washings and aspirates are unacceptable for Xpert Xpress SARS-CoV-2/FLU/RSV  testing.  Fact Sheet for Patients: BloggerCourse.com  Fact Sheet for Healthcare Providers: SeriousBroker.it  This test is not yet approved or cleared by the [place] FDA and has been authorized for detection and/or diagnosis of SARS-CoV-2 by FDA under an Emergency Use Authorization (EUA). This EUA will remain in effect (meaning this test can be used) for the duration of the COVID-19 declaration under Section 564(b)(1) of the Act, 21 U.S.C. section 360bbb-3(b)(1), unless the authorization is terminated or revoked.     Resp Syncytial Virus by PCR NEGATIVE NEGATIVE Final    Comment: (NOTE) Fact Sheet for Patients: BloggerCourse.com  Fact Sheet for Healthcare Providers: SeriousBroker.it  This test is not yet approved or cleared by the [place] FDA and has been authorized for detection and/or diagnosis of SARS-CoV-2 by FDA under an Emergency Use Authorization (EUA). This EUA will remain in effect (meaning this test can be used) for the duration of the COVID-19 declaration under  Section 564(b)(1) of the Act, 21 U.S.C. section 360bbb-3(b)(1), unless the authorization is terminated or revoked.  Performed at [facility], [address], [place], [place] [address]      Studies: ECHOCARDIOGRAM COMPLETE Result Date: 03/19/2023    ECHOCARDIOGRAM REPORT   Patient Name:   Jill Franco Date of Exam: 03/19/2023 Medical Rec #:  [id number]        Height:       64.0 in Accession #:    [id number]       Weight:       100.2 lb Date of Birth:  [birth year]        BSA:          1.458 m Patient Age:    88 years         BP:           147/76 mmHg Patient Gender: F                HR:           95 bpm. Exam Location:  Inpatient Procedure: 2D Echo, Color Doppler and Cardiac Doppler Indications:    dyspnea  History:        Patient has prior history of Echocardiogram examinations, most                 recent 06/19/2007. COPD and PAD; Risk Factors:Former Smoker,                 Hypertension and Dyslipidemia.  Sonographer:    Delcie Roch RDCS Referring Phys: [id number] [facility] POK[facility]REL  Sonographer Comments: Image acquisition challenging due to respiratory motion. IMPRESSIONS  1. Left ventricular ejection fraction, by estimation, is >75%. The left ventricle has hyperdynamic function. The left ventricle has no regional wall motion abnormalities. Left ventricular diastolic parameters are consistent with Grade I diastolic dysfunction (impaired relaxation).  2. Right ventricular systolic function is normal. The right ventricular size is normal.  3. The mitral valve is normal in structure. No evidence of mitral valve regurgitation. No evidence of mitral stenosis.  4. The aortic valve is tricuspid. Aortic valve regurgitation is not visualized. Aortic valve sclerosis is present, with no evidence of aortic valve stenosis.  5. The inferior vena cava is normal in size with greater than 50% respiratory variability, suggesting right atrial pressure of 3 mmHg. Comparison(s): No prior Echocardiogram. FINDINGS  Left  Ventricle: Left ventricular ejection fraction, by estimation, is >75%. The left ventricle has hyperdynamic function. The left ventricle has no regional wall motion  abnormalities. The left ventricular internal cavity size was normal in size. There is no left ventricular hypertrophy. Left ventricular diastolic parameters are consistent with Grade I diastolic dysfunction (impaired relaxation). Right Ventricle: The right ventricular size is normal. Right ventricular systolic function is normal. Left Atrium: Left atrial size was normal in size. Right Atrium: Right atrial size was normal in size. Pericardium: Trivial pericardial effusion is present. Mitral Valve: The mitral valve is normal in structure. No evidence of mitral valve regurgitation. No evidence of mitral valve stenosis. Tricuspid Valve: The tricuspid valve is normal in structure. Tricuspid valve regurgitation is trivial. No evidence of tricuspid stenosis. Aortic Valve: The aortic valve is tricuspid. Aortic valve regurgitation is not visualized. Aortic valve sclerosis is present, with no evidence of aortic valve stenosis. Pulmonic Valve: The pulmonic valve was normal in structure. Pulmonic valve regurgitation is not visualized. No evidence of pulmonic stenosis. Aorta: The aortic root is normal in size and structure. Venous: The inferior vena cava is normal in size with greater than 50% respiratory variability, suggesting right atrial pressure of 3 mmHg. IAS/Shunts: The interatrial septum was not well visualized.  LEFT VENTRICLE PLAX 2D LVIDd:         3.40 cm   Diastology LVIDs:         2.00 cm   LV e' medial:    6.64 cm/s LV PW:         0.90 cm   LV E/e' medial:  9.2 LV IVS:        0.80 cm   LV e' lateral:   8.38 cm/s LVOT diam:     1.80 cm   LV E/e' lateral: 7.3 LVOT Area:     2.54 cm  RIGHT VENTRICLE             IVC RV S prime:     11.00 cm/s  IVC diam: 2.00 cm LEFT ATRIUM             Index LA diam:        2.70 cm 1.85 cm/m LA Vol (A2C):   20.9 ml 14.33  ml/m LA Vol (A4C):   25.3 ml 17.35 ml/m LA Biplane Vol: 24.5 ml 16.80 ml/m   AORTA Ao Root diam: 2.90 cm MV E velocity: 60.90 cm/s MV A velocity: 83.90 cm/s  SHUNTS MV E/A ratio:  0.73        Systemic Diam: 1.80 cm Olga Millers MD Electronically signed by Olga Millers MD Signature Date/Time: 03/19/2023/4:04:57 PM    Final    CT Angio Chest Pulmonary Embolism (PE) W or WO Contrast Result Date: 03/19/2023 CLINICAL DATA:  89 year old female with recent shortness of breath. Abnormal D-dimer. EXAM: CT ANGIOGRAPHY CHEST WITH CONTRAST TECHNIQUE: Multidetector CT imaging of the chest was performed using the standard protocol during bolus administration of intravenous contrast. Multiplanar CT image reconstructions and MIPs were obtained to evaluate the vascular anatomy. RADIATION DOSE REDUCTION: This exam was performed according to the departmental dose-optimization program which includes automated exposure control, adjustment of the mA and/or kV according to patient size and/or use of iterative reconstruction technique. CONTRAST:  75mL OMNIPAQUE IOHEXOL 350 MG/ML SOLN COMPARISON:  CTA chest 06/05/2020.  Portable chest 03/17/2023. FINDINGS: Cardiovascular: Excellent contrast bolus timing in the pulmonary arterial tree. No pulmonary artery filling defect. Extensive Calcified aortic atherosclerosis. Extensive calcified coronary artery atherosclerosis. Heart size is stable since 2022, within normal limits. No pericardial effusion. Negative for thoracic aortic aneurysm or dissection. Mediastinum/Nodes: Negative. No mediastinal mass or lymphadenopathy. Lungs/Pleura:  Centrilobular emphysema. Chronic apical lung scarring is stable. Pulmonary nodule in the left lower lobe left lower lobe series 6, image 109 is stable from a chest CT 10/18/2019 and benign. No pleural effusion. No active pulmonary inflammation identified. Upper Abdomen: Mild contrast reflux into the hepatic veins and IVC. Otherwise negative visible mostly  noncontrast liver, spleen, pancreas, kidneys. Chronic adrenal gland thickening compatible with hyperplasia is stable. Grossly negative visible bowel. Musculoskeletal: Stable.  Osteopenia. Review of the MIP images confirms the above findings. IMPRESSION: 1. Negative for pulmonary embolus. No acute finding identified in the chest. 2. Aortic Atherosclerosis (ICD10-I70.0) and Emphysema (ICD10-J43.9). Electronically Signed   By: Odessa Fleming M.D.   On: 03/19/2023 12:06      Huey Bienenstock, MD  [facility] 03/20/2023  If 7PM-7AM, please contact night-coverage

## 2023-03-20 NOTE — Evaluation (Signed)
Occupational Therapy Evaluation Patient Details Name: Jill Franco MRN: [id number] DOB: [birth year] Today's Date: 03/20/2023   History of Present Illness Jill Franco is a 89 y.o. female with past medical history  of allergies to Lipitor Advil aspirin codeine, COPD, hypothyroidism, GERD, history of anemia, tobacco abuse, history of leg claudication, history of CVA, pulmonary nodule, presenting today with shortness of breath.   Clinical Impression   Pt reports ind at baseline with ADL/functional mobility, lives with her daughter. Pt currently needing set up - max A for ADLs (incr assist for LB ADL due to SOB), supervision for bed mobility and CGA for transfers with RW. Pt with 2/4 DOE with transfer to [facility] and then to chair, SpO2 with overall good wave pleth, reading in 90's on 2L O2. One instance of SpO2 dropping to high 70s but then quickly returned to 90s. Pt presenting with impairments listed below, will follow acutely. Recommend HHOT at d/c.       If plan is discharge home, recommend the following: A little help with walking and/or transfers;A lot of help with bathing/dressing/bathroom;Assistance with cooking/housework;Direct supervision/assist for medications management;Direct supervision/assist for financial management;Assist for transportation;Help with stairs or ramp for entrance    Functional Status Assessment  Patient has had a recent decline in their functional status and demonstrates the ability to make significant improvements in function in a reasonable and predictable amount of time.  Equipment Recommendations  BSC/3in1    Recommendations for Other Services PT consult     Precautions / Restrictions Precautions Precautions: Other (comment) Precaution Comments: Monitor for DOE; desats on room air Restrictions Weight Bearing Restrictions Per Provider Order: No      Mobility Bed Mobility Overal bed mobility: Needs Assistance Bed Mobility: Supine to Sit, Sit to  Supine     Supine to sit: Supervision          Transfers Overall transfer level: Needs assistance Equipment used: Rolling walker (2 wheels) Transfers: Sit to/from Stand Sit to Stand: Contact guard assist                  Balance Overall balance assessment: Needs assistance Sitting-balance support: Feet supported Sitting balance-Leahy Scale: Good     Standing balance support: During functional activity, Reliant on assistive device for balance Standing balance-Leahy Scale: Poor Standing balance comment: reliant on external support                           ADL either performed or assessed with clinical judgement   ADL Overall ADL's : Needs assistance/impaired Eating/Feeding: Set up   Grooming: Set up   Upper Body Bathing: Minimal assistance   Lower Body Bathing: Maximal assistance   Upper Body Dressing : Minimal assistance   Lower Body Dressing: Maximal assistance   Toilet Transfer: Ambulation;Rolling walker (2 wheels);Contact guard assist   Toileting- Clothing Manipulation and Hygiene: Contact guard assist       Functional mobility during ADLs: Contact guard assist;Rolling walker (2 wheels)       Vision   Vision Assessment?: No apparent visual deficits     Perception Perception: Not tested       Praxis Praxis: Not tested       Pertinent Vitals/Pain Pain Assessment Pain Assessment: No/denies pain     Extremity/Trunk Assessment Upper Extremity Assessment Upper Extremity Assessment: Generalized weakness   Lower Extremity Assessment Lower Extremity Assessment: Defer to PT evaluation   Cervical / Trunk Assessment Cervical / Trunk Assessment:  Normal   Communication Communication Communication: Hearing impairment   Cognition Arousal: Alert Behavior During Therapy: WFL for tasks assessed/performed Overall Cognitive Status: Within Functional Limits for tasks assessed                                        General Comments  SpO2 down to high 70's on 2L O2 briefly during session, however quickly incr to 90s once seated in chair with good wave pleth    Exercises     Shoulder Instructions      Home Living Family/patient expects to be discharged to:: Private residence Living Arrangements: Children (daughter) Available Help at Discharge: Family Type of Home: House Home Access: Ramped entrance     Home Layout: One level     Bathroom Shower/Tub: Walk-in shower         Home Equipment: Crutches;Toilet riser;Wheelchair - Forensic psychologist (2 wheels)          Prior Functioning/Environment Prior Level of Function : Independent/Modified Independent;Driving             Mobility Comments: typically does not use Education administrator; Household and short distance ambulator; for example, drives to the store, and sits and waits for her daughter to do the shopping ADLs Comments: ind        OT Problem List: Decreased strength;Decreased range of motion;Decreased activity tolerance;Impaired balance (sitting and/or standing);Cardiopulmonary status limiting activity      OT Treatment/Interventions: Self-care/ADL training;Therapeutic exercise;Energy conservation;DME and/or AE instruction;Therapeutic activities;Patient/family education;Balance training    OT Goals(Current goals can be found in the care plan section) Acute Rehab OT Goals Patient Stated Goal: to get OOB OT Goal Formulation: With patient Time For Goal Achievement: 04/03/23 Potential to Achieve Goals: Good ADL Goals Pt Will Perform Upper Body Dressing: with supervision;sitting Pt Will Perform Lower Body Dressing: with supervision;sit to/from stand;sitting/lateral leans Pt Will Transfer to Toilet: with supervision;ambulating;regular height toilet Pt Will Perform Tub/Shower Transfer: Tub transfer;Shower transfer;with supervision;ambulating Additional ADL Goal #1: pt will tolerate standing OOB activity x10 min in order to  improve activity tolerance for ADL Additional ADL Goal #2: pt will verbalize x3 energy conservation strategies in prep for ADLs  OT Frequency: Min 1X/week    Co-evaluation              AM-PAC OT "6 Clicks" Daily Activity     Outcome Measure Help from another person eating meals?: None Help from another person taking care of personal grooming?: A Little Help from another person toileting, which includes using toliet, bedpan, or urinal?: A Little Help from another person bathing (including washing, rinsing, drying)?: A Lot Help from another person to put on and taking off regular upper body clothing?: A Little Help from another person to put on and taking off regular lower body clothing?: A Lot 6 Click Score: 17   End of Session Equipment Utilized During Treatment: Gait belt;Rolling walker (2 wheels);Oxygen (2L) Nurse Communication: Mobility status  Activity Tolerance: Patient tolerated treatment well Patient left: with call bell/phone within reach;in chair;with chair alarm set  OT Visit Diagnosis: Unsteadiness on feet (R26.81);Other abnormalities of gait and mobility (R26.89);Muscle weakness (generalized) (M62.81)                Time: 5621-3086 OT Time Calculation (min): 28 min Charges:  OT General Charges $OT Visit: 1 Visit OT Evaluation $OT Eval Moderate Complexity: 1 Mod OT Treatments $Self Care/Home Management :  8-22 mins  Carver Fila, OTD, OTR/L SecureChat Preferred Acute Rehab [phone]   Dalphine Handing 03/20/2023, 9:03 AM

## 2023-03-21 DIAGNOSIS — J441 Chronic obstructive pulmonary disease with (acute) exacerbation: Secondary | ICD-10-CM | POA: Diagnosis not present

## 2023-03-21 LAB — CBC
HCT: 46.2 % — ABNORMAL HIGH (ref 36.0–46.0)
Hemoglobin: 15.9 g/dL — ABNORMAL HIGH (ref 12.0–15.0)
MCH: 32.2 pg (ref 26.0–34.0)
MCHC: 34.4 g/dL (ref 30.0–36.0)
MCV: 93.5 fL (ref 80.0–100.0)
Platelets: 165 10*3/uL (ref 150–400)
RBC: 4.94 MIL/uL (ref 3.87–5.11)
RDW: 13.2 % (ref 11.5–15.5)
WBC: 8.5 10*3/uL (ref 4.0–10.5)
nRBC: 0 % (ref 0.0–0.2)

## 2023-03-21 LAB — BASIC METABOLIC PANEL
Anion gap: 10 (ref 5–15)
BUN: 31 mg/dL — ABNORMAL HIGH (ref 8–23)
CO2: 25 mmol/L (ref 22–32)
Calcium: 8.7 mg/dL — ABNORMAL LOW (ref 8.9–10.3)
Chloride: 107 mmol/L (ref 98–111)
Creatinine, Ser: 0.65 mg/dL (ref 0.44–1.00)
GFR, Estimated: >60 mL/min (ref >60)
Glucose, Bld: 132 mg/dL — ABNORMAL HIGH (ref 70–99)
Potassium: 3.6 mmol/L (ref 3.5–5.1)
Sodium: 142 mmol/L (ref 135–145)

## 2023-03-21 LAB — MAGNESIUM: Magnesium: 2.3 mg/dL (ref 1.7–2.4)

## 2023-03-21 LAB — PHOSPHORUS: Phosphorus: 3.1 mg/dL (ref 2.5–4.6)

## 2023-03-21 MED ORDER — METHYLPREDNISOLONE SODIUM SUCC 125 MG IJ SOLR
125.0000 mg | Freq: Every day | INTRAMUSCULAR | Status: DC
Start: 2023-03-21 — End: 2023-03-22
  Administered 2023-03-21 – 2023-03-22 (×2): 125 mg via INTRAVENOUS
  Filled 2023-03-21 (×3): qty 2

## 2023-03-21 NOTE — TOC Progression Note (Signed)
Transition of Care [facility]) - Progression Note    Patient Details  Name: Jill Franco MRN: [id number] Date of Birth: April 12, 1934  [facility]) CM/SW Contact  Gordy Clement, RN Phone Number: 03/21/2023, 2:59 PM  Clinical Narrative:     Patient has been recommended Home Health PT and OT. Frances Furbish will provide services A rollator and a BSC have already been delivered bedside. TOC will continue to follow patient for any additional discharge needs           Expected Discharge Plan and Services                                               Social Determinants of Health (SDOH) Interventions SDOH Screenings   Food Insecurity: No Food Insecurity (03/18/2023)  Housing: Low Risk  (03/18/2023)  Transportation Needs: No Transportation Needs (03/18/2023)  Utilities: Not At Risk (03/18/2023)  Alcohol Screen: Low Risk  (08/24/2022)  Depression (PHQ2-9): Low Risk  (12/26/2022)  Financial Resource Strain: Low Risk  (08/24/2022)  Physical Activity: Insufficiently Active (08/24/2022)  Social Connections: Moderately Isolated (03/18/2023)  Stress: No Stress Concern Present (08/24/2022)  Tobacco Use: Medium Risk (03/17/2023)    Readmission Risk Interventions    11/03/2021   12:53 PM  Readmission Risk Prevention Plan  Transportation Screening Complete  PCP or Specialist Appt within 5-7 Days Complete  Home Care Screening Complete  Medication Review (RN CM) Complete

## 2023-03-21 NOTE — Care Management Important Message (Signed)
Important Message  Patient Details  Name: Jill Franco MRN: [id number] Date of Birth: 08/18/1934   Important Message Given:  Yes - Medicare IM     Dorena Bodo 03/21/2023, 4:41 PM

## 2023-03-21 NOTE — Progress Notes (Signed)
Physical Therapy Treatment Patient Details Name: Jill Franco MRN: [id number] DOB: [birth year] Today's Date: 03/21/2023   History of Present Illness Jill Franco is a 89 y.o. female with past medical history  of allergies to Lipitor Advil aspirin codeine, COPD, hypothyroidism, GERD, history of anemia, tobacco abuse, history of leg claudication, history of CVA, pulmonary nodule, presenting today with shortness of breath.    PT Comments  Pt received sitting in the recliner and agreeable to session. Pt able to tolerate gait trial this session with instructions on rollator use. Pt demonstrates good carryover and stability with supervision for safety. Pt able to ambulate on RA with SpO2 remaining 89-92% when pleth was stable, however pt demonstrates DOE requiring one seated rest break and cues for pursed lip breathing. SpO2 93% at end of session on RA, however pt continuing to demonstrate increased shortness of breath. Pt would benefit from additional O2 sat test prior to discharge to ensure SpO2 continues to be stable on RA during mobility. Pt continues to benefit from PT services to progress toward functional mobility goals.    If plan is discharge home, recommend the following: Assistance with cooking/housework   Can travel by Pension scheme manager (4 wheels);Other (comment)    Recommendations for Other Services       Precautions / Restrictions Precautions Precautions: Other (comment) Precaution Comments: O2 sats Restrictions Weight Bearing Restrictions Per Provider Order: No     Mobility  Bed Mobility Overal bed mobility: Needs Assistance Bed Mobility: Sit to Supine       Sit to supine: Supervision, HOB elevated   General bed mobility comments: increased time    Transfers Overall transfer level: Needs assistance Equipment used: Rollator (4 wheels) Transfers: Sit to/from Stand Sit to Stand: Contact guard assist            General transfer comment: Instructions on locking rollator brakes and cues for hand placement    Ambulation/Gait Ambulation/Gait assistance: Supervision Gait Distance (Feet): 50 Feet (x2) Assistive device: Rollator (4 wheels) Gait Pattern/deviations: Step-through pattern, Decreased stride length, Trunk flexed       General Gait Details: slow step-through gait with cues for rollator proximity and upright posture. 1 seated rest break due to fatigue and DOE       Balance Overall balance assessment: Needs assistance Sitting-balance support: No upper extremity supported, Feet supported Sitting balance-Leahy Scale: Good     Standing balance support: During functional activity, Bilateral upper extremity supported Standing balance-Leahy Scale: Fair Standing balance comment: with rollator support. pt able to static stand without UE support with no LOB                            Cognition Arousal: Alert Behavior During Therapy: WFL for tasks assessed/performed Overall Cognitive Status: Within Functional Limits for tasks assessed                                          Exercises      General Comments General comments (skin integrity, edema, etc.): SpO2 92% on RA and pt able to ambulate on RA with SpO2 remaining >89% when pleth stable. Cues for pursed lip breathing      Pertinent Vitals/Pain Pain Assessment Pain Assessment: No/denies pain     PT Goals (current goals can now  be found in the care plan section) Acute Rehab PT Goals Patient Stated Goal: wants to be able to get home PT Goal Formulation: With patient Time For Goal Achievement: 04/02/23 Progress towards PT goals: Progressing toward goals    Frequency    Min 1X/week       AM-PAC PT "6 Clicks" Mobility   Outcome Measure  Help needed turning from your back to your side while in a flat bed without using bedrails?: None Help needed moving from lying on your back to sitting on  the side of a flat bed without using bedrails?: None Help needed moving to and from a bed to a chair (including a wheelchair)?: A Little Help needed standing up from a chair using your arms (e.g., wheelchair or bedside chair)?: A Little Help needed to walk in hospital room?: A Little Help needed climbing 3-5 steps with a railing? : A Little 6 Click Score: 20    End of Session Equipment Utilized During Treatment: Oxygen;Gait belt Activity Tolerance: Patient limited by fatigue Patient left: in bed;with call bell/phone within reach;with family/visitor present Nurse Communication: Mobility status PT Visit Diagnosis: Unsteadiness on feet (R26.81);Other (comment)     Time: 8657-8469 PT Time Calculation (min) (ACUTE ONLY): 29 min  Charges:    $Gait Training: 23-37 mins PT General Charges $$ ACUTE PT VISIT: 1 Visit                     Johny Shock, PTA Acute Rehabilitation Services Secure Chat Preferred  [phone]    Johny Shock 03/21/2023, 4:07 PM

## 2023-03-21 NOTE — Plan of Care (Signed)
  Problem: Health Behavior/Discharge Planning: Goal: Ability to manage health-related needs will improve 03/21/2023 0244 by Georgia Duff, RN Outcome: Progressing 03/21/2023 0240 by Georgia Duff, RN Outcome: Progressing   Problem: Clinical Measurements: Goal: Will remain free from infection 03/21/2023 0244 by Georgia Duff, RN Outcome: Progressing 03/21/2023 0240 by Georgia Duff, RN Outcome: Progressing   Problem: Activity: Goal: Risk for activity intolerance will decrease 03/21/2023 0244 by Georgia Duff, RN Outcome: Progressing 03/21/2023 0240 by Georgia Duff, RN Outcome: Progressing   Problem: Safety: Goal: Ability to remain free from injury will improve 03/21/2023 0244 by Georgia Duff, RN Outcome: Progressing 03/21/2023 0240 by Georgia Duff, RN Outcome: Progressing

## 2023-03-21 NOTE — Progress Notes (Signed)
   03/21/23 0914  Mobility  Activity Transferred from bed to chair  Level of Assistance Contact guard assist, steadying assist  Assistive Device Other (Comment) (HHA)  Activity Response Tolerated fair  Mobility Referral Yes  Mobility visit 1 Mobility  Mobility Specialist Start Time (ACUTE ONLY) [id number]  Mobility Specialist Stop Time (ACUTE ONLY) 0914  Mobility Specialist Time Calculation (min) (ACUTE ONLY) 16 min   Mobility Specialist: Progress Note  Pre-Mobility:      HR 90s, SpO2 94% 1L Post-Mobility:    HR 93, SpO2 93% 1L  Pt agreeable to mobility session - received in bed. C/o fatigue and SOB post mobility. Returned to chair with all needs met - call bell within reach.   Barnie Mort, BS Mobility Specialist Please contact via SecureChat or  Rehab office at [phone].

## 2023-03-21 NOTE — Evaluation (Addendum)
Clinical/Bedside Swallow Evaluation Patient Details  Name: Jill Franco MRN: [id number] Date of Birth: September 21, 1934  Today's Date: 03/21/2023 Time: SLP Start Time (ACUTE ONLY): 1219 SLP Stop Time (ACUTE ONLY): 1232 SLP Time Calculation (min) (ACUTE ONLY): 13 min  Past Medical History:  Past Medical History:  Diagnosis Date   Cataract    bilaterally corrected.   COPD (chronic obstructive pulmonary disease) (HCC)    CVA (cerebral vascular accident) (HCC)    Pt was unaware and does not know when it happened.  Was told after some testing was done.   Double vision    Dyspnea    Facial droop 10/2017   left   History of shingles 2012   HLD (hyperlipidemia)    HTN (hypertension)    Hypothyroidism    PVD (peripheral vascular disease) (HCC)    Vision loss    temporary   Past Surgical History:  Past Surgical History:  Procedure Laterality Date   ABDOMINAL AORTOGRAM N/A 04/30/2021   Procedure: ABDOMINAL AORTOGRAM;  Surgeon: Leonie Douglas, MD;  Location: [facility];  Service: Cardiovascular;  Laterality: N/A;   ABDOMINAL EXPLORATION SURGERY  1950s   "they thought I was pregnant; went in to explore"   APPENDECTOMY  childhood   BACK SURGERY     BREAST LUMPECTOMY Left 1960's   benign   carotid ultrasound  06/19/2007   0-39% stenosis   CATARACT EXTRACTION W/ INTRAOCULAR LENS IMPLANT Left 09/2016   CATARACT EXTRACTION W/ INTRAOCULAR LENS IMPLANT Right 10/2016   COLONOSCOPY WITH PROPOFOL N/A 04/08/2019   Procedure: COLONOSCOPY WITH PROPOFOL;  Surgeon: Rachael Fee, MD;  Location: [facility];  Service: Endoscopy;  Laterality: N/A;   DILATION AND CURETTAGE OF UTERUS  1960's X 3   miscarriages   DOPPLER ECHOCARDIOGRAPHY  06/19/2007   Normal EF 55-70%   ENDARTERECTOMY FEMORAL Left 12/03/2020   Procedure: LEFT FEMORAL ENDARTERECTOMY AND PROFUNDAPLASTY;  Surgeon: Leonie Douglas, MD;  Location: [facility] OR;  Service: Vascular;  Laterality: Left;   ILIAC ARTERY STENT Left 03/18/2016    common iliac stent (6 x 40), aortic stent (10 x 19)/notes 03/18/2016   INSERTION OF ILIAC STENT Left 12/03/2020   Procedure: AORTIC STENTING, INSERTION OF BILATERAL COMMON ILIAC STENTS, AND LEFT EXTERNAL ILIAC STENT;  Surgeon: Leonie Douglas, MD;  Location: [facility];  Service: Vascular;  Laterality: Left;   LAMINECTOMY AND MICRODISCECTOMY SPINE  1960's X 2   LOWER EXTREMITY ANGIOGRAM N/A 12/03/2020   Procedure: Emeterio Reeve;  Surgeon: Leonie Douglas, MD;  Location: [facility];  Service: Vascular;  Laterality: N/A;   MOLE REMOVAL  07/10/2016   NSVD     x1   PATCH ANGIOPLASTY Left 12/03/2020   Procedure: PATCH ANGIOPLASTY OF LEFT COMMON FEMORAL ARTERY USING BOVINE PERICARDIUM PATCH;  Surgeon: Leonie Douglas, MD;  Location: [facility] OR;  Service: Vascular;  Laterality: Left;   PERIPHERAL VASCULAR BALLOON ANGIOPLASTY Right 04/30/2021   Procedure: PERIPHERAL VASCULAR BALLOON ANGIOPLASTY;  Surgeon: Leonie Douglas, MD;  Location: [facility];  Service: Cardiovascular;  Laterality: Right;  rt common iliac   PERIPHERAL VASCULAR CATHETERIZATION N/A 03/18/2016   Procedure: Abdominal Aortogram w/Lower Extremity;  Surgeon: Sherren Kerns, MD;  Location: [facility] INVAS[facility] CV [facility];  Service: Cardiovascular;  Laterality: N/A;   PERIPHERAL VASCULAR CATHETERIZATION  03/18/2016   Procedure: Peripheral Vascular Intervention;  Surgeon: Sherren Kerns, MD;  Location: [facility] IN[facility] CV [facility];  Service: Cardiovascular;;   PERIPHERAL VASCULAR INTERVENTION Left 04/30/2021  Procedure: PERIPHERAL VASCULAR INTERVENTION;  Surgeon: Leonie Douglas, MD;  Location: [facility];  Service: Cardiovascular;  Laterality: Left;  lt common iliac   POLYPECTOMY  04/08/2019   Procedure: POLYPECTOMY;  Surgeon: Rachael Fee, MD;  Location: [facility];  Service: Endoscopy;;   ULTRASOUND GUIDANCE FOR VASCULAR ACCESS Right 12/03/2020   Procedure: ULTRASOUND GUIDANCE FOR VASCULAR ACCESS, RIGHT FEMORAL ARTERY;  Surgeon: Leonie Douglas, MD;  Location: [facility] OR;  Service: Vascular;  Laterality: Right;   HPI:  Jill Franco is an 89 yo female presenting to ED 1/24 with increased SOB. CXR negative for active disease. PMH includes COPD, hypothyroidism, GERD, tobacco abuse, leg claudication, prior CVA, pulmonary nodule, multiple medication allergies    Assessment / Plan / Recommendation  Clinical Impression  Pt states she occasionally experiences coughing when drinking. She wears dentures and states that she typically consumes a regular diet but has a reduced appetite.Observed pt with trials of thin liquids and regular solids without overt s/s of dysphagia or aspiration. Provided education to pt and her daughter regarding the coordination of breathing and swallowing in addition to rate control strategies. Recommend she continue a regular diet with thin liquids via controlled, single sips. This allows her to make decisions regarding manageable textures. Provide meds whole with puree. No further SLP f/u is needed, will s/o. SLP Visit Diagnosis: Dysphagia, unspecified (R13.10)    Aspiration Risk  Mild aspiration risk    Diet Recommendation Regular;Thin liquid    Liquid Administration via: Cup;Straw Medication Administration: Whole meds with puree Supervision: Patient able to self feed Compensations: Slow rate;Small sips/bites Postural Changes: Seated upright at 90 degrees    Other  Recommendations Oral Care Recommendations: Oral care BID    Recommendations for follow up therapy are one component of a multi-disciplinary discharge planning process, led by the attending physician.  Recommendations may be updated based on patient status, additional functional criteria and insurance authorization.  Follow up Recommendations No SLP follow up      Assistance Recommended at Discharge    Functional Status Assessment Patient has not had a recent decline in their functional status  Frequency and Duration             Prognosis Prognosis for improved oropharyngeal function: Good Barriers to Reach Goals: Time post onset      Swallow Study   General HPI: Jill Franco is an 89 yo female presenting to ED 1/24 with increased SOB. CXR negative for active disease. PMH includes COPD, hypothyroidism, GERD, tobacco abuse, leg claudication, prior CVA, pulmonary nodule, multiple medication allergies Type of Study: Bedside Swallow Evaluation Previous Swallow Assessment: none in chart Diet Prior to this Study: Regular;Thin liquids (Level 0) Temperature Spikes Noted: No Respiratory Status: Nasal cannula History of Recent Intubation: No Behavior/Cognition: Alert;Cooperative;Pleasant mood Oral Cavity Assessment: Within Functional Limits Oral Care Completed by SLP: No Oral Cavity - Dentition: Dentures, top;Dentures, bottom Vision: Functional for self-feeding Self-Feeding Abilities: Able to feed self Patient Positioning: Upright in chair Baseline Vocal Quality: Normal Volitional Cough: Congested Volitional Swallow: Able to elicit    Oral/Motor/Sensory Function Overall Oral Motor/Sensory Function: Within functional limits   Ice Chips Ice chips: Not tested   Thin Liquid Thin Liquid: Within functional limits Presentation: Straw;Self Fed    Nectar Thick Nectar Thick Liquid: Not tested   Honey Thick Honey Thick Liquid: Not tested   Puree Puree: Not tested   Solid     Solid: Within functional limits Presentation: Self Fed  Gwynneth Aliment, M.A., CF-SLP Speech Language Pathology, Acute Rehabilitation Services  Secure Chat preferred [phone]  03/21/2023,12:58 PM

## 2023-03-21 NOTE — Progress Notes (Signed)
PROGRESS NOTE  Jill Franco ZOX:[id number] DOB: 1934/09/07 DOA: 03/17/2023 PCP: Joaquim Nam, MD   LOS: 3 days   Brief narrative:  Jill Franco is a 89 y.o. female with past medical history of COPD, hypothyroidism, GERD, history of anemia, tobacco abuse,  leg claudication, history of CVA, pulmonary nodule, multiple medication allergies presented to hospital with shortness of breath for 1 day not improved with nebulizer with some bilateral leg swelling.  In the ED, patient was noted to be hypertensive, tachycardic with diffuse wheezing.  Initial labs were showed a normal white count.  BNP was elevated at 105.  Hemoglobin was 17.3.  Influenza COVID and RSV was negative.  CBC showed normal WBC.  TSH of 0.5.  Free T4 was 1.1. EKG showed normal sinus rhythm. Respiratory panel negative for flu RSV and COVID.  Chest x-ray showed no acute infiltrate.  Patient was then considered for admission to the hospital for further evaluation and treatment.     Assessment/Plan: Principal Problem:   COPD exacerbation (HCC) Active Problems:   COPD with acute exacerbation (HCC)   Hypothyroidism   GERD without esophagitis   Nicotine dependence, cigarettes, uncomplicated   Anemia   Protein-calorie malnutrition, severe (HCC)   HLD (hyperlipidemia)   Decreased pedal pulses  COPD with acute exacerbation  -On 2 L nasal cannula currently, it does appear he desaturated in the 70s room air with physical therapy, -Encouraged use incentive spirometry and flutter valve -With worsening wheezing this morning and diminished air entry, will increase her steroids back to 125 mg IV daily -Continue with scheduled DuoNebs -CTA chest has been obtained today given elevated D-dimers, no evidence of PE -Continue with IV antibiotics given productive cough, SLP evaluated today, mild risk for aspiration, continue with current diet.  Hypothyroidism Continue Synthroid   GERD without esophagitis Continue Protonix    Nicotine dependence, cigarettes, uncomplicated She was counseled Continue nicotine patch   Decreased pedal pulses from chronic PAD. On Xarelto and aspirin as outpatient.  ABI July 2023 was abnormal.  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 This required endovascular revision 04/30/21 for mechanical compression of the iliac stenting.  options going forward include aortobifemoral bypass, if worsening.   HLD (hyperlipidemia) Continue Crestor, Xarelto and aspirin.  DVT prophylaxis:  rivaroxaban (XARELTO) tablet 2.5 mg   Disposition:, Home   Code Status:     Code Status: Full Code  Family Communication: None at bedside  Consultants: None yet  Procedures: None  Anti-infectives:  Rocephin and Zithromax  Anti-infectives (From admission, onward)    Start     Dose/Rate Route Frequency Ordered Stop   03/18/23 2000  azithromycin (ZITHROMAX) 500 mg in sodium chloride 0.9 % 250 mL IVPB        500 mg 250 mL/hr over 60 Minutes Intravenous Every 24 hours 03/17/23 1924 03/21/23 0337   03/18/23 1900  cefTRIAXone (ROCEPHIN) 1 g in sodium chloride 0.9 % 100 mL IVPB        1 g 200 mL/hr over 30 Minutes Intravenous Every 24 hours 03/17/23 2005 03/23/23 1859   03/18/23 1000  cefTRIAXone (ROCEPHIN) 1 g in sodium chloride 0.9 % 100 mL IVPB  Status:  Discontinued        1 g 200 mL/hr over 30 Minutes Intravenous Every 24 hours 03/17/23 1924 03/17/23 2005   03/17/23 1715  cefTRIAXone (ROCEPHIN) 1 g in sodium chloride 0.9 % 100 mL IVPB  1 g 200 mL/hr over 30 Minutes Intravenous  Once 03/17/23 1711 03/17/23 1915   03/17/23 1715  azithromycin (ZITHROMAX) 500 mg in sodium chloride 0.9 % 250 mL IVPB        500 mg 250 mL/hr over 60 Minutes Intravenous  Once 03/17/23 1711 03/17/23 2054       Subjective:  Patient reports she is feeling her dyspnea got worse today, she mentioned some coughing while  drinking thin liquids    Objective: Vitals:   03/21/23 0942 03/21/23 1417  BP: (!) 151/82 (!) 144/75  Pulse: 89 82  Resp: 17 16  Temp: (!) 97.4 F (36.3 C)   SpO2: 94% 91%    Intake/Output Summary (Last 24 hours) at 03/21/2023 1425 Last data filed at 03/21/2023 0511 Gross per 24 hour  Intake 3 ml  Output 1100 ml  Net -1097 ml   There were no vitals filed for this visit. There is no height or weight on file to calculate BMI.   Physical Exam:  Awake Alert, Oriented X 3, frail, deconditioned, chronically ill-appearing Symmetrical Chest wall movement, he is with diminished air entry today, mildly tachypneic with significant wheezing RRR,No Gallops,Rubs or new Murmurs, No Parasternal Heave +ve B.Sounds, Abd Soft, No tenderness, No rebound - guarding or rigidity. No Cyanosis, Clubbing or edema, No new Rash or bruise     Data Review: I have personally reviewed the following laboratory data and studies,  CBC: Recent Labs  Lab 03/17/23 1519 03/17/23 2053 03/19/23 0748 03/20/23 0503 03/21/23 0442  WBC 9.0 5.8 15.9* 10.4 8.5  NEUTROABS 8.4*  --   --   --   --   HGB 17.3* 15.3* 16.6* 15.7* 15.9*  HCT 50.4* 46.1* 48.2* 47.0* 46.2*  MCV 94.6 97.3 94.0 95.9 93.5  PLT 216 168 195 180 165   Basic Metabolic Panel: Recent Labs  Lab 03/17/23 1519 03/18/23 0337 03/19/23 0748 03/20/23 0503 03/21/23 0442  NA 136 137 142 141 142  K 4.1 3.7 3.9 3.9 3.6  CL 100 102 105 103 107  CO2 24 24 24 26 25   GLUCOSE 176* 158* 129* 139* 132*  BUN 21 16 38* 33* 31*  CREATININE 0.92 0.66 0.85 0.83 0.65  CALCIUM 9.7 9.1 9.5 8.9 8.7*  MG  --   --  2.3 2.3 2.3  PHOS  --   --  3.9 3.6 3.1   Liver Function Tests: No results for input(s): "AST", "ALT", "ALKPHOS", "BILITOT", "PROT", "ALBUMIN" in the last 168 hours. No results for input(s): "LIPASE", "AMYLASE" in the last 168 hours. No results for input(s): "AMMONIA" in the last 168 hours. Cardiac Enzymes: No results for input(s): "CKTOTAL",  "CKMB", "CKMBINDEX", "TROPONINI" in the last 168 hours. BNP (last 3 results) Recent Labs    03/17/23 1521 03/18/23 1758 03/20/23 0503  BNP 105.5* 429.3* 755.2*    ProBNP (last 3 results) No results for input(s): "PROBNP" in the last 8760 hours.  CBG: No results for input(s): "GLUCAP" in the last 168 hours. Recent Results (from the past 240 hours)  Resp panel by RT-PCR (RSV, Flu A&B, Covid) Anterior Nasal Swab     Status: None   Collection Time: 03/17/23  3:21 PM   Specimen: Anterior Nasal Swab  Result Value Ref Range Status   SARS Coronavirus 2 by RT PCR NEGATIVE NEGATIVE Final   Influenza A by PCR NEGATIVE NEGATIVE Final   Influenza B by PCR NEGATIVE NEGATIVE Final    Comment: (NOTE) The Xpert Xpress SARS-CoV-2/FLU/RSV plus assay  is intended as an aid in the diagnosis of influenza from Nasopharyngeal swab specimens and should not be used as a sole basis for treatment. Nasal washings and aspirates are unacceptable for Xpert Xpress SARS-CoV-2/FLU/RSV testing.  Fact Sheet for Patients: BloggerCourse.com  Fact Sheet for Healthcare Providers: SeriousBroker.it  This test is not yet approved or cleared by the [place] FDA and has been authorized for detection and/or diagnosis of SARS-CoV-2 by FDA under an Emergency Use Authorization (EUA). This EUA will remain in effect (meaning this test can be used) for the duration of the COVID-19 declaration under Section 564(b)(1) of the Act, 21 U.S.C. section 360bbb-3(b)(1), unless the authorization is terminated or revoked.     Resp Syncytial Virus by PCR NEGATIVE NEGATIVE Final    Comment: (NOTE) Fact Sheet for Patients: BloggerCourse.com  Fact Sheet for Healthcare Providers: SeriousBroker.it  This test is not yet approved or cleared by the [place] FDA and has been authorized for detection and/or diagnosis of SARS-CoV-2  by FDA under an Emergency Use Authorization (EUA). This EUA will remain in effect (meaning this test can be used) for the duration of the COVID-19 declaration under Section 564(b)(1) of the Act, 21 U.S.C. section 360bbb-3(b)(1), unless the authorization is terminated or revoked.  Performed at [facility], [address] [place], [place] [address]      Studies: ECHOCARDIOGRAM COMPLETE Result Date: 03/19/2023    ECHOCARDIOGRAM REPORT   Patient Name:   Jill Franco Armentor Date of Exam: 03/19/2023 Medical Rec #:  [id number]        Height:       64.0 in Accession #:    [id number]       Weight:       100.2 lb Date of Birth:  1934-12-10        BSA:          1.458 m Patient Age:    88 years         BP:           147/76 mmHg Patient Gender: F                HR:           95 bpm. Exam Location:  Inpatient Procedure: 2D Echo, Color Doppler and Cardiac Doppler Indications:    dyspnea  History:        Patient has prior history of Echocardiogram examinations, most                 recent 06/19/2007. COPD and PAD; Risk Factors:Former Smoker,                 Hypertension and Dyslipidemia.  Sonographer:    Delcie Roch RDCS Referring Phys: [id number] [facility] [place]  Sonographer Comments: Image acquisition challenging due to respiratory motion. IMPRESSIONS  1. Left ventricular ejection fraction, by estimation, is >75%. The left ventricle has hyperdynamic function. The left ventricle has no regional wall motion abnormalities. Left ventricular diastolic parameters are consistent with Grade I diastolic dysfunction (impaired relaxation).  2. Right ventricular systolic function is normal. The right ventricular size is normal.  3. The mitral valve is normal in structure. No evidence of mitral valve regurgitation. No evidence of mitral stenosis.  4. The aortic valve is tricuspid. Aortic valve regurgitation is not visualized. Aortic valve sclerosis is present, with no evidence of aortic valve stenosis.  5. The inferior vena  cava is normal in size with greater than 50% respiratory variability, suggesting right  atrial pressure of 3 mmHg. Comparison(s): No prior Echocardiogram. FINDINGS  Left Ventricle: Left ventricular ejection fraction, by estimation, is >75%. The left ventricle has hyperdynamic function. The left ventricle has no regional wall motion abnormalities. The left ventricular internal cavity size was normal in size. There is no left ventricular hypertrophy. Left ventricular diastolic parameters are consistent with Grade I diastolic dysfunction (impaired relaxation). Right Ventricle: The right ventricular size is normal. Right ventricular systolic function is normal. Left Atrium: Left atrial size was normal in size. Right Atrium: Right atrial size was normal in size. Pericardium: Trivial pericardial effusion is present. Mitral Valve: The mitral valve is normal in structure. No evidence of mitral valve regurgitation. No evidence of mitral valve stenosis. Tricuspid Valve: The tricuspid valve is normal in structure. Tricuspid valve regurgitation is trivial. No evidence of tricuspid stenosis. Aortic Valve: The aortic valve is tricuspid. Aortic valve regurgitation is not visualized. Aortic valve sclerosis is present, with no evidence of aortic valve stenosis. Pulmonic Valve: The pulmonic valve was normal in structure. Pulmonic valve regurgitation is not visualized. No evidence of pulmonic stenosis. Aorta: The aortic root is normal in size and structure. Venous: The inferior vena cava is normal in size with greater than 50% respiratory variability, suggesting right atrial pressure of 3 mmHg. IAS/Shunts: The interatrial septum was not well visualized.  LEFT VENTRICLE PLAX 2D LVIDd:         3.40 cm   Diastology LVIDs:         2.00 cm   LV e' medial:    6.64 cm/s LV PW:         0.90 cm   LV E/e' medial:  9.2 LV IVS:        0.80 cm   LV e' lateral:   8.38 cm/s LVOT diam:     1.80 cm   LV E/e' lateral: 7.3 LVOT Area:     2.54 cm  RIGHT  VENTRICLE             IVC RV S prime:     11.00 cm/s  IVC diam: 2.00 cm LEFT ATRIUM             Index LA diam:        2.70 cm 1.85 cm/m LA Vol (A2C):   20.9 ml 14.33 ml/m LA Vol (A4C):   25.3 ml 17.35 ml/m LA Biplane Vol: 24.5 ml 16.80 ml/m   AORTA Ao Root diam: 2.90 cm MV E velocity: 60.90 cm/s MV A velocity: 83.90 cm/s  SHUNTS MV E/A ratio:  0.73        Systemic Diam: 1.80 cm Olga Millers MD Electronically signed by Olga Millers MD Signature Date/Time: 03/19/2023/4:04:57 PM    Final       Huey Bienenstock, MD  [facility] 03/21/2023  If 7PM-7AM, please contact night-coverage

## 2023-03-22 ENCOUNTER — Other Ambulatory Visit (HOSPITAL_COMMUNITY): Payer: Self-pay

## 2023-03-22 DIAGNOSIS — K219 Gastro-esophageal reflux disease without esophagitis: Secondary | ICD-10-CM | POA: Diagnosis not present

## 2023-03-22 DIAGNOSIS — J441 Chronic obstructive pulmonary disease with (acute) exacerbation: Secondary | ICD-10-CM | POA: Diagnosis not present

## 2023-03-22 DIAGNOSIS — E039 Hypothyroidism, unspecified: Secondary | ICD-10-CM

## 2023-03-22 DIAGNOSIS — E43 Unspecified severe protein-calorie malnutrition: Secondary | ICD-10-CM | POA: Diagnosis not present

## 2023-03-22 LAB — PHOSPHORUS: Phosphorus: 2.3 mg/dL — ABNORMAL LOW (ref 2.5–4.6)

## 2023-03-22 LAB — CBC
HCT: 46.8 % — ABNORMAL HIGH (ref 36.0–46.0)
Hemoglobin: 15.8 g/dL — ABNORMAL HIGH (ref 12.0–15.0)
MCH: 31.6 pg (ref 26.0–34.0)
MCHC: 33.8 g/dL (ref 30.0–36.0)
MCV: 93.6 fL (ref 80.0–100.0)
Platelets: 150 10*3/uL (ref 150–400)
RBC: 5 MIL/uL (ref 3.87–5.11)
RDW: 13.2 % (ref 11.5–15.5)
WBC: 10 10*3/uL (ref 4.0–10.5)
nRBC: 0 % (ref 0.0–0.2)

## 2023-03-22 LAB — BASIC METABOLIC PANEL
Anion gap: 8 (ref 5–15)
BUN: 29 mg/dL — ABNORMAL HIGH (ref 8–23)
CO2: 26 mmol/L (ref 22–32)
Calcium: 8.5 mg/dL — ABNORMAL LOW (ref 8.9–10.3)
Chloride: 107 mmol/L (ref 98–111)
Creatinine, Ser: 0.65 mg/dL (ref 0.44–1.00)
GFR, Estimated: >60 mL/min (ref >60)
Glucose, Bld: 119 mg/dL — ABNORMAL HIGH (ref 70–99)
Potassium: 3.5 mmol/L (ref 3.5–5.1)
Sodium: 141 mmol/L (ref 135–145)

## 2023-03-22 LAB — MAGNESIUM: Magnesium: 2.3 mg/dL (ref 1.7–2.4)

## 2023-03-22 MED ORDER — IPRATROPIUM-ALBUTEROL 0.5-2.5 (3) MG/3ML IN SOLN
3.0000 mL | Freq: Four times a day (QID) | RESPIRATORY_TRACT | 1 refills | Status: AC | PRN
  Filled 2023-03-22: qty 360, 30d supply, fill #0

## 2023-03-22 MED ORDER — PREDNISONE 10 MG PO TABS
ORAL_TABLET | ORAL | 0 refills | Status: AC
  Filled 2023-03-22: qty 10, 4d supply, fill #0

## 2023-03-22 MED ORDER — TIOTROPIUM BROMIDE MONOHYDRATE 18 MCG IN CAPS
18.0000 ug | ORAL_CAPSULE | Freq: Every day | RESPIRATORY_TRACT | 2 refills | Status: DC
  Filled 2023-03-22: qty 30, 30d supply, fill #0

## 2023-03-22 NOTE — Discharge Summary (Signed)
PATIENT DETAILS Name: Jill Franco Age: 89 y.o. Sex: female Date of Birth: September 20, 1934 MRN: [id number]. Admitting Physician: Joycelyn Das, MD WGN:[id number], Dwana Curd, MD  Admit Date: 03/17/2023 Discharge date: 03/22/2023  Recommendations for Outpatient Follow-up:  Follow up with PCP in 1-2 weeks Please obtain CMP/CBC in one week  Admitted From:  Home  Disposition: Home   Discharge Condition: good  CODE STATUS:   Code Status: Full Code   Diet recommendation:  Diet Order             Diet - low sodium heart healthy           Diet regular Room service appropriate? Yes with Assist; Fluid consistency: Thin  Diet effective now                    Brief Summary: 89 year old with COPD-presented with worsening shortness of breath-found to have COPD exacerbation and admitted to the hospitalist service.  Significant studies 1/24>> COVID/influenza/RSV: Negative 1/26>> CT angio chest: No PE 1/26>> TTE: EF 75%, grade 1 diastolic dysfunction.  Brief Hospital Course: COPD exacerbation Treated with IV steroids/bronchodilators-with significant improvement-she feels she is back to her baseline-on room air this morning Plan is to transition to oral prednisone-continue Symbicort-will add Spiriva.  She will continue to use albuterol inhaler for rescue.  At family's request-will provide her with  nebulizer-and will use nebulized bronchodilators as a second line for rescue.  Hypothyroidism Synthroid  GERD PPI  PAD Has required numerous procedures including left femoral endarterectomy/profundoplasty in the past. Continue Xarelto/aspirin  HLD Statin  Nutrition Status: Nutrition Problem: Increased nutrient needs Etiology: chronic illness Signs/Symptoms: estimated needs Interventions: Ensure Enlive (each supplement provides 350kcal and 20 grams of protein), Liberalize Diet, MVI   Note-daughter updated on day of discharge.  Discharge Diagnoses:  Principal Problem:    COPD exacerbation (HCC) Active Problems:   COPD with acute exacerbation (HCC)   Hypothyroidism   GERD without esophagitis   Nicotine dependence, cigarettes, uncomplicated   Anemia   Protein-calorie malnutrition, severe (HCC)   HLD (hyperlipidemia)   Decreased pedal pulses   Discharge Instructions:  Activity:  As tolerated   Discharge Instructions     Call MD for:  difficulty breathing, headache or visual disturbances   Complete by: As directed    Diet - low sodium heart healthy   Complete by: As directed    Discharge instructions   Complete by: As directed    Follow with Primary MD  Joaquim Nam, MD in 1-2 weeks  Please get a complete blood count and chemistry panel checked by your Primary MD at your next visit, and again as instructed by your Primary MD.  Get Medicines reviewed and adjusted: Please take all your medications with you for your next visit with your Primary MD  Laboratory/radiological data: Please request your Primary MD to go over all hospital tests and procedure/radiological results at the follow up, please ask your Primary MD to get all Hospital records sent to his/her office.  In some cases, they will be blood work, cultures and biopsy results pending at the time of your discharge. Please request that your primary care M.D. follows up on these results.  Also Note the following: If you experience worsening of your admission symptoms, develop shortness of breath, life threatening emergency, suicidal or homicidal thoughts you must seek medical attention immediately by calling 911 or calling your MD immediately  if symptoms less severe.  You must read complete instructions/literature along with  all the possible adverse reactions/side effects for all the Medicines you take and that have been prescribed to you. Take any new Medicines after you have completely understood and accpet all the possible adverse reactions/side effects.   Do not drive when taking  Pain medications or sleeping medications (Benzodaizepines)  Do not take more than prescribed Pain, Sleep and Anxiety Medications. It is not advisable to combine anxiety,sleep and pain medications without talking with your primary care practitioner  Special Instructions: If you have smoked or chewed Tobacco  in the last 2 yrs please stop smoking, stop any regular Alcohol  and or any Recreational drug use.  Wear Seat belts while driving.  Please note: You were cared for by a hospitalist during your hospital stay. Once you are discharged, your primary care physician will handle any further medical issues. Please note that NO REFILLS for any discharge medications will be authorized once you are discharged, as it is imperative that you return to your primary care physician (or establish a relationship with a primary care physician if you do not have one) for your post hospital discharge needs so that they can reassess your need for medications and monitor your lab values.   For home use only DME Nebulizer machine   Complete by: As directed    Patient needs a nebulizer to treat with the following condition: COPD (chronic obstructive pulmonary disease) (HCC)   Length of Need: Lifetime   Additional equipment included: Administration kit   Increase activity slowly   Complete by: As directed       Allergies as of 03/22/2023       Reactions   Lipitor [atorvastatin] Other (See Comments)   myalgia   Advil [ibuprofen] Nausea And Vomiting   Aspirin Other (See Comments)   Uncoated aspirin causes stomach upset on empty stomach   Codeine Nausea Only, Other (See Comments)   Pill needs to be E.coated   Other Itching, Rash   Nicotine patches         Medication List     TAKE these medications    acetaminophen 650 MG CR tablet Commonly known as: TYLENOL Take 650 mg by mouth every 8 (eight) hours as needed for pain.   albuterol 108 (90 Base) MCG/ACT inhaler Commonly known as: VENTOLIN  HFA Inhale 2 puffs into the lungs every 6 (six) hours as needed for wheezing or shortness of breath.   alendronate 70 MG tablet Commonly known as: FOSAMAX TAKE 1 TABLET BY MOUTH WEEKLY  WITH 8 OZ OF PLAIN WATER 30  MINUTES BEFORE FIRST FOOD, DRINK OR MEDS. STAY UPRIGHT FOR 30  MINS What changed: See the new instructions.   aspirin EC 81 MG tablet Take 1 tablet (81 mg total) by mouth daily. Swallow whole.   budesonide-formoterol 160-4.5 MCG/ACT inhaler Commonly known as: Symbicort Inhale 2 puffs into the lungs in the morning and at bedtime. Rinse after use.   diclofenac Sodium 1 % Gel Commonly known as: Voltaren Apply 2 g topically 4 (four) times daily.   furosemide 20 MG tablet Commonly known as: LASIX TAKE 1.5 TABLETS BY MOUTH IN THE  MORNING IF NEEDED FOR SWELLING   ipratropium-albuterol 0.5-2.5 (3) MG/3ML Soln Commonly known as: DUONEB Take 3 mLs by nebulization every 6 (six) hours as needed.   levothyroxine 50 MCG tablet Commonly known as: SYNTHROID TAKE 1 TABLET BY MOUTH DAILY  EXCEPT 2 TABLETS ON SUNDAYS AND  WEDNESDAYS ( TOTAL 9 TABS PER  WEEK)   melatonin 5 MG  Tabs Take 5 mg by mouth at bedtime as needed (For sleep).   methocarbamol 500 MG tablet Commonly known as: ROBAXIN Take 1 tablet (500 mg total) by mouth 2 (two) times daily as needed for muscle spasms.   metoprolol tartrate 100 MG tablet Commonly known as: LOPRESSOR TAKE 1/2 TABLET IN THE MORNING AND 1 TABLET IN THE  EVENING IF BP IS ABOVE 140/90.  Off med as of 11/01/22   predniSONE 10 MG tablet Commonly known as: DELTASONE Take 40 mg daily for 1 day, 30 mg daily for 1 day, 20 mg daily for 1 days,10 mg daily for 1 day, then stop   rosuvastatin 20 MG tablet Commonly known as: CRESTOR TAKE 1 TABLET BY MOUTH DAILY   tiotropium 18 MCG inhalation capsule Commonly known as: Spiriva HandiHaler Place 1 capsule (18 mcg total) into inhaler and inhale daily.   vitamin B-12 500 MCG tablet Commonly known as:  CYANOCOBALAMIN Take 500 mcg by mouth daily.   Vitamin D3 25 MCG (1000 UT) Caps Take 1 capsule (1,000 Units total) by mouth daily.   Xarelto 2.5 MG Tabs tablet Generic drug: rivaroxaban TAKE 1 TABLET BY MOUTH TWICE  DAILY               Durable Medical Equipment  (From admission, onward)           Start     Ordered   03/22/23 0000  For home use only DME Nebulizer machine       Question Answer Comment  Patient needs a nebulizer to treat with the following condition COPD (chronic obstructive pulmonary disease) (HCC)   Length of Need Lifetime   Additional equipment included Administration kit      03/22/23 0928            Follow-up Information     [facility] Follow up.   Specialty: Home Health Services Why: [facility] will provide home health PT and OT and will contact you within 48 hours of discharge to home Contact information: [address] [place] [address] [phone]         Joaquim Nam, MD. Schedule an appointment as soon as possible for a visit in 1 week(s).   Specialty: Family Medicine Contact information: [address] [place] [address] [phone]                Allergies  Allergen Reactions   Lipitor [Atorvastatin] Other (See Comments)    myalgia   Advil [Ibuprofen] Nausea And Vomiting   Aspirin Other (See Comments)    Uncoated aspirin causes stomach upset on empty stomach   Codeine Nausea Only and Other (See Comments)    Pill needs to be E.coated   Other Itching and Rash    Nicotine patches      Other Procedures/Studies: ECHOCARDIOGRAM COMPLETE Result Date: 03/19/2023    ECHOCARDIOGRAM REPORT   Patient Name:   BRENDALY TOWNSEL Steffenhagen Date of Exam: 03/19/2023 Medical Rec #:  [id number]        Height:       64.0 in Accession #:    [id number]       Weight:       100.2 lb Date of Birth:  1934-03-18        BSA:          1.458 m Patient Age:    88 years         BP:  147/76 mmHg Patient  Gender: F                HR:           95 bpm. Exam Location:  Inpatient Procedure: 2D Echo, Color Doppler and Cardiac Doppler Indications:    dyspnea  History:        Patient has prior history of Echocardiogram examinations, most                 recent 06/19/2007. COPD and PAD; Risk Factors:Former Smoker,                 Hypertension and Dyslipidemia.  Sonographer:    Delcie Roch RDCS Referring Phys: [id number] [facility]  Sonographer Comments: Image acquisition challenging due to respiratory motion. IMPRESSIONS  1. Left ventricular ejection fraction, by estimation, is >75%. The left ventricle has hyperdynamic function. The left ventricle has no regional wall motion abnormalities. Left ventricular diastolic parameters are consistent with Grade I diastolic dysfunction (impaired relaxation).  2. Right ventricular systolic function is normal. The right ventricular size is normal.  3. The mitral valve is normal in structure. No evidence of mitral valve regurgitation. No evidence of mitral stenosis.  4. The aortic valve is tricuspid. Aortic valve regurgitation is not visualized. Aortic valve sclerosis is present, with no evidence of aortic valve stenosis.  5. The inferior vena cava is normal in size with greater than 50% respiratory variability, suggesting right atrial pressure of 3 mmHg. Comparison(s): No prior Echocardiogram. FINDINGS  Left Ventricle: Left ventricular ejection fraction, by estimation, is >75%. The left ventricle has hyperdynamic function. The left ventricle has no regional wall motion abnormalities. The left ventricular internal cavity size was normal in size. There is no left ventricular hypertrophy. Left ventricular diastolic parameters are consistent with Grade I diastolic dysfunction (impaired relaxation). Right Ventricle: The right ventricular size is normal. Right ventricular systolic function is normal. Left Atrium: Left atrial size was normal in size. Right Atrium: Right atrial size was  normal in size. Pericardium: Trivial pericardial effusion is present. Mitral Valve: The mitral valve is normal in structure. No evidence of mitral valve regurgitation. No evidence of mitral valve stenosis. Tricuspid Valve: The tricuspid valve is normal in structure. Tricuspid valve regurgitation is trivial. No evidence of tricuspid stenosis. Aortic Valve: The aortic valve is tricuspid. Aortic valve regurgitation is not visualized. Aortic valve sclerosis is present, with no evidence of aortic valve stenosis. Pulmonic Valve: The pulmonic valve was normal in structure. Pulmonic valve regurgitation is not visualized. No evidence of pulmonic stenosis. Aorta: The aortic root is normal in size and structure. Venous: The inferior vena cava is normal in size with greater than 50% respiratory variability, suggesting right atrial pressure of 3 mmHg. IAS/Shunts: The interatrial septum was not well visualized.  LEFT VENTRICLE PLAX 2D LVIDd:         3.40 cm   Diastology LVIDs:         2.00 cm   LV e' medial:    6.64 cm/s LV PW:         0.90 cm   LV E/e' medial:  9.2 LV IVS:        0.80 cm   LV e' lateral:   8.38 cm/s LVOT diam:     1.80 cm   LV E/e' lateral: 7.3 LVOT Area:     2.54 cm  RIGHT VENTRICLE             IVC RV S prime:  11.00 cm/s  IVC diam: 2.00 cm LEFT ATRIUM             Index LA diam:        2.70 cm 1.85 cm/m LA Vol (A2C):   20.9 ml 14.33 ml/m LA Vol (A4C):   25.3 ml 17.35 ml/m LA Biplane Vol: 24.5 ml 16.80 ml/m   AORTA Ao Root diam: 2.90 cm MV E velocity: 60.90 cm/s MV A velocity: 83.90 cm/s  SHUNTS MV E/A ratio:  0.73        Systemic Diam: 1.80 cm Olga Millers MD Electronically signed by Olga Millers MD Signature Date/Time: 03/19/2023/4:04:57 PM    Final    CT Angio Chest Pulmonary Embolism (PE) W or WO Contrast Result Date: 03/19/2023 CLINICAL DATA:  89 year old female with recent shortness of breath. Abnormal D-dimer. EXAM: CT ANGIOGRAPHY CHEST WITH CONTRAST TECHNIQUE: Multidetector CT imaging of  the chest was performed using the standard protocol during bolus administration of intravenous contrast. Multiplanar CT image reconstructions and MIPs were obtained to evaluate the vascular anatomy. RADIATION DOSE REDUCTION: This exam was performed according to the departmental dose-optimization program which includes automated exposure control, adjustment of the mA and/or kV according to patient size and/or use of iterative reconstruction technique. CONTRAST:  75mL OMNIPAQUE IOHEXOL 350 MG/ML SOLN COMPARISON:  CTA chest 06/05/2020.  Portable chest 03/17/2023. FINDINGS: Cardiovascular: Excellent contrast bolus timing in the pulmonary arterial tree. No pulmonary artery filling defect. Extensive Calcified aortic atherosclerosis. Extensive calcified coronary artery atherosclerosis. Heart size is stable since 2022, within normal limits. No pericardial effusion. Negative for thoracic aortic aneurysm or dissection. Mediastinum/Nodes: Negative. No mediastinal mass or lymphadenopathy. Lungs/Pleura: Centrilobular emphysema. Chronic apical lung scarring is stable. Pulmonary nodule in the left lower lobe left lower lobe series 6, image 109 is stable from a chest CT 10/18/2019 and benign. No pleural effusion. No active pulmonary inflammation identified. Upper Abdomen: Mild contrast reflux into the hepatic veins and IVC. Otherwise negative visible mostly noncontrast liver, spleen, pancreas, kidneys. Chronic adrenal gland thickening compatible with hyperplasia is stable. Grossly negative visible bowel. Musculoskeletal: Stable.  Osteopenia. Review of the MIP images confirms the above findings. IMPRESSION: 1. Negative for pulmonary embolus. No acute finding identified in the chest. 2. Aortic Atherosclerosis (ICD10-I70.0) and Emphysema (ICD10-J43.9). Electronically Signed   By: Odessa Fleming M.D.   On: 03/19/2023 12:06   DG Chest Port 1 View Result Date: 03/17/2023 CLINICAL DATA:  Shortness of breath EXAM: PORTABLE CHEST 1 VIEW  COMPARISON:  Chest radiograph dated 12/26/2022 FINDINGS: Normal lung volumes. Biapical pleural-parenchymal scarring. No focal consolidations. Left basilar atelectasis/scarring. No pleural effusion or pneumothorax. The heart size and mediastinal contours are within normal limits. No acute osseous abnormality. IMPRESSION: No acute disease. Electronically Signed   By: Agustin Cree M.D.   On: 03/17/2023 15:49     TODAY-DAY OF DISCHARGE:  Subjective:   Jill Franco today has no headache,no chest abdominal pain,no new weakness tingling or numbness, feels much better wants to go home today.   Objective:   Blood pressure (!) 153/87, pulse 82, temperature (!) 97.5 F (36.4 C), temperature source Oral, resp. rate 16, SpO2 91%.  Intake/Output Summary (Last 24 hours) at 03/22/2023 0929 Last data filed at 03/22/2023 0845 Gross per 24 hour  Intake --  Output 1800 ml  Net -1800 ml   There were no vitals filed for this visit.  Exam: Awake Alert, Oriented *3, No new F.N deficits, Normal affect [place].AT,PERRAL Supple Neck,No JVD, No cervical lymphadenopathy appriciated.  Symmetrical  Chest wall movement, Good air movement bilaterally, CTAB RRR,No Gallops,Rubs or new Murmurs, No Parasternal Heave +ve B.Sounds, Abd Soft, Non tender, No organomegaly appriciated, No rebound -guarding or rigidity. No Cyanosis, Clubbing or edema, No new Rash or bruise   PERTINENT RADIOLOGIC STUDIES: No results found.   PERTINENT LAB RESULTS: CBC: Recent Labs    03/21/23 0442 03/22/23 0447  WBC 8.5 10.0  HGB 15.9* 15.8*  HCT 46.2* 46.8*  PLT 165 150   CMET CMP     Component Value Date/Time   NA 141 03/22/2023 0447   NA 143 04/06/2021 0000   K 3.5 03/22/2023 0447   CL 107 03/22/2023 0447   CO2 26 03/22/2023 0447   GLUCOSE 119 (H) 03/22/2023 0447   BUN 29 (H) 03/22/2023 0447   BUN 23 04/06/2021 0000   CREATININE 0.65 03/22/2023 0447   CALCIUM 8.5 (L) 03/22/2023 0447   PROT 7.2 05/12/2022 0905   ALBUMIN  4.3 05/12/2022 0905   AST 22 05/12/2022 0905   ALT 14 05/12/2022 0905   ALKPHOS 69 05/12/2022 0905   BILITOT 0.4 05/12/2022 0905   GFR 64.03 05/12/2022 0905   EGFR 61 04/06/2021 0000   GFRNONAA >60 03/22/2023 0447    GFR CrCl cannot be calculated (Unknown ideal weight.). No results for input(s): "LIPASE", "AMYLASE" in the last 72 hours. No results for input(s): "CKTOTAL", "CKMB", "CKMBINDEX", "TROPONINI" in the last 72 hours. Invalid input(s): "POCBNP" No results for input(s): "DDIMER" in the last 72 hours. No results for input(s): "HGBA1C" in the last 72 hours. No results for input(s): "CHOL", "HDL", "LDLCALC", "TRIG", "CHOLHDL", "LDLDIRECT" in the last 72 hours. No results for input(s): "TSH", "T4TOTAL", "T3FREE", "THYROIDAB" in the last 72 hours.  Invalid input(s): "FREET3" No results for input(s): "VITAMINB12", "FOLATE", "FERRITIN", "TIBC", "IRON", "RETICCTPCT" in the last 72 hours. Coags: No results for input(s): "INR" in the last 72 hours.  Invalid input(s): "PT" Microbiology: Recent Results (from the past 240 hours)  Resp panel by RT-PCR (RSV, Flu A&B, Covid) Anterior Nasal Swab     Status: None   Collection Time: 03/17/23  3:21 PM   Specimen: Anterior Nasal Swab  Result Value Ref Range Status   SARS Coronavirus 2 by RT PCR NEGATIVE NEGATIVE Final   Influenza A by PCR NEGATIVE NEGATIVE Final   Influenza B by PCR NEGATIVE NEGATIVE Final    Comment: (NOTE) The Xpert Xpress SARS-CoV-2/FLU/RSV plus assay is intended as an aid in the diagnosis of influenza from Nasopharyngeal swab specimens and should not be used as a sole basis for treatment. Nasal washings and aspirates are unacceptable for Xpert Xpress SARS-CoV-2/FLU/RSV testing.  Fact Sheet for Patients: BloggerCourse.com  Fact Sheet for Healthcare Providers: SeriousBroker.it  This test is not yet approved or cleared by the [place] FDA and has been  authorized for detection and/or diagnosis of SARS-CoV-2 by FDA under an Emergency Use Authorization (EUA). This EUA will remain in effect (meaning this test can be used) for the duration of the COVID-19 declaration under Section 564(b)(1) of the Act, 21 U.S.C. section 360bbb-3(b)(1), unless the authorization is terminated or revoked.     Resp Syncytial Virus by PCR NEGATIVE NEGATIVE Final    Comment: (NOTE) Fact Sheet for Patients: BloggerCourse.com  Fact Sheet for Healthcare Providers: SeriousBroker.it  This test is not yet approved or cleared by the [place] FDA and has been authorized for detection and/or diagnosis of SARS-CoV-2 by FDA under an Emergency Use Authorization (EUA). This EUA will remain in effect (meaning  this test can be used) for the duration of the COVID-19 declaration under Section 564(b)(1) of the Act, 21 U.S.C. section 360bbb-3(b)(1), unless the authorization is terminated or revoked.  Performed at [facility], [address], [place], [place] [address]     FURTHER DISCHARGE INSTRUCTIONS:  Get Medicines reviewed and adjusted: Please take all your medications with you for your next visit with your Primary MD  Laboratory/radiological data: Please request your Primary MD to go over all hospital tests and procedure/radiological results at the follow up, please ask your Primary MD to get all Hospital records sent to his/her office.  In some cases, they will be blood work, cultures and biopsy results pending at the time of your discharge. Please request that your primary care M.D. goes through all the records of your hospital data and follows up on these results.  Also Note the following: If you experience worsening of your admission symptoms, develop shortness of breath, life threatening emergency, suicidal or homicidal thoughts you must seek medical attention immediately by calling 911 or calling  your MD immediately  if symptoms less severe.  You must read complete instructions/literature along with all the possible adverse reactions/side effects for all the Medicines you take and that have been prescribed to you. Take any new Medicines after you have completely understood and accpet all the possible adverse reactions/side effects.   Do not drive when taking Pain medications or sleeping medications (Benzodaizepines)  Do not take more than prescribed Pain, Sleep and Anxiety Medications. It is not advisable to combine anxiety,sleep and pain medications without talking with your primary care practitioner  Special Instructions: If you have smoked or chewed Tobacco  in the last 2 yrs please stop smoking, stop any regular Alcohol  and or any Recreational drug use.  Wear Seat belts while driving.  Please note: You were cared for by a hospitalist during your hospital stay. Once you are discharged, your primary care physician will handle any further medical issues. Please note that NO REFILLS for any discharge medications will be authorized once you are discharged, as it is imperative that you return to your primary care physician (or establish a relationship with a primary care physician if you do not have one) for your post hospital discharge needs so that they can reassess your need for medications and monitor your lab values.  Total Time spent coordinating discharge including counseling, education and face to face time equals greater than 30 minutes.  SignedJeoffrey Massed 03/22/2023 9:29 AM

## 2023-03-22 NOTE — Progress Notes (Addendum)
Brief note Seen and examined earlier this morning-on room air with O2 saturations in mid 90s-feels much better and is requesting discharge home-claims she walked with nursing staff yesterday and did pretty well.  Lungs clear on exam.  Will discharge home at her request-see discharge summary for further details.

## 2023-03-22 NOTE — Plan of Care (Signed)
  Problem: Education: Goal: Knowledge of General Education information will improve Description: Including pain rating scale, medication(s)/side effects and non-pharmacologic comfort measures Outcome: Progressing   Problem: Clinical Measurements: Goal: Ability to maintain clinical measurements within normal limits will improve Outcome: Progressing Goal: Will remain free from infection Outcome: Progressing Goal: Respiratory complications will improve Outcome: Progressing   Problem: Nutrition: Goal: Adequate nutrition will be maintained Outcome: Progressing   Problem: Elimination: Goal: Will not experience complications related to bowel motility Outcome: Progressing   Problem: Skin Integrity: Goal: Risk for impaired skin integrity will decrease Outcome: Progressing   Problem: Respiratory: Goal: Ability to maintain a clear airway will improve Outcome: Progressing Goal: Levels of oxygenation will improve Outcome: Progressing Goal: Ability to maintain adequate ventilation will improve Outcome: Progressing

## 2023-03-22 NOTE — TOC Transition Note (Signed)
Transition of Care [facility]) - Discharge Note   Patient Details  Name: Jill Franco MRN: [id number] Date of Birth: 1934/11/26  Transition of Care [facility]) CM/SW Contact:  Kermit Balo, RN Phone Number: 03/22/2023, 10:48 AM   Clinical Narrative:     Pt is discharging home with home health services through [facility]. (Arranged yesterday per CM).  Rollator and BSC delivered to the room.  Nebulizer machine ordered through [employer] and will be delivered to the room. Pt has transportation home.   Final next level of care: Home w Home Health Services Barriers to Discharge: No Barriers Identified   Patient Goals and CMS Choice   CMS Medicare.gov Compare Post Acute Care list provided to:: Patient Choice offered to / list presented to : Patient      Discharge Placement                       Discharge Plan and Services Additional resources added to the After Visit Summary for                  DME Arranged: Walker rolling with seat, Bedside commode, Nebulizer/meds DME Agency: [employer] Date DME Agency Contacted: 03/22/23   Representative spoke with at DME Agency: Zack HH Arranged: PT, OT HH Agency: [facility] Date [facility] Agency Contacted: 03/21/23   Representative spoke with at [facility] Agency: Kandee Keen  Social Drivers of Health (SDOH) Interventions SDOH Screenings   Food Insecurity: No Food Insecurity (03/18/2023)  Housing: Low Risk  (03/18/2023)  Transportation Needs: No Transportation Needs (03/18/2023)  Utilities: Not At Risk (03/18/2023)  Alcohol Screen: Low Risk  (08/24/2022)  Depression (PHQ2-9): Low Risk  (12/26/2022)  Financial Resource Strain: Low Risk  (08/24/2022)  Physical Activity: Insufficiently Active (08/24/2022)  Social Connections: Moderately Isolated (03/18/2023)  Stress: No Stress Concern Present (08/24/2022)  Tobacco Use: Medium Risk (03/17/2023)     Readmission Risk Interventions    11/03/2021   12:53 PM  Readmission Risk Prevention Plan   Transportation Screening Complete  PCP or Specialist Appt within 5-7 Days Complete  Home Care Screening Complete  Medication Review (RN CM) Complete

## 2023-03-22 NOTE — Progress Notes (Signed)
DISCHARGE NOTE HOME Jill Franco to be discharged Home per MD order. Discussed prescriptions and follow up appointments with the patient. Prescriptions given to patient; medication list explained in detail. Patient verbalized understanding.  Skin clean, dry and intact without evidence of skin break down, no evidence of skin tears noted. IV catheter discontinued intact. Site without signs and symptoms of complications. Dressing and pressure applied. Pt denies pain at the site currently. No complaints noted.  Patient free of lines, drains, and wounds.   An After Visit Summary (AVS) was printed and given to the patient. Patient escorted via wheelchair, and discharged home via private auto.  [name], RN

## 2023-03-23 ENCOUNTER — Telehealth: Payer: Self-pay

## 2023-03-23 NOTE — Transitions of Care (Post Inpatient/ED Visit) (Signed)
03/23/2023  Name: Jill Franco MRN: [id number] DOB: Oct 31, 1934  Today's TOC FU Call Status: Today's TOC FU Call Status:: Successful TOC FU Call Completed TOC FU Call Complete Date: 03/23/23 Patient's Name and Date of Birth confirmed.  Transition Care Management Follow-up Telephone Call Date of Discharge: 03/22/23 Discharge Facility: [facility]) Type of Discharge: Inpatient Admission Primary Inpatient Discharge Diagnosis:: COPD How have you been since you were released from the hospital?: Better Any questions or concerns?: No  Items Reviewed: Did you receive and understand the discharge instructions provided?: Yes Medications obtained,verified, and reconciled?: Yes (Medications Reviewed) Any new allergies since your discharge?: No Dietary orders reviewed?: Yes Type of Diet Ordered:: Low Sodium Heart healthy Do you have support at home?: Yes People in Home: child(ren), dependent Name of Support/Comfort Primary Source: Daughter  Medications Reviewed Today: Medications Reviewed Today     Reviewed by Redge Gainer, RN (Case Manager) on 03/23/23 at 1319  Med List Status: <None>   Medication Order Taking? Sig Documenting Provider Last Dose Status Informant  acetaminophen (TYLENOL) 650 MG CR tablet [id number] No Take 650 mg by mouth every 8 (eight) hours as needed for pain. [provider] Taking Active Self, Pharmacy Records  albuterol (VENTOLIN HFA) 108 (90 Base) MCG/ACT inhaler [id number] No Inhale 2 puffs into the lungs every 6 (six) hours as needed for wheezing or shortness of breath. Joaquim Nam, MD Taking Active Self, Pharmacy Records  alendronate (FOSAMAX) 70 MG tablet [id number] No TAKE 1 TABLET BY MOUTH WEEKLY  WITH 8 OZ OF PLAIN WATER 30  MINUTES BEFORE FIRST FOOD, DRINK OR MEDS. STAY UPRIGHT FOR 30  MINS  Patient taking differently: Take 70 mg by mouth once a week. On Sundays   Joaquim Nam, MD 03/12/2023 Active Self, Pharmacy Records  aspirin EC  81 MG tablet [id number] No Take 1 tablet (81 mg total) by mouth daily. Swallow whole. Rayford Halsted, PA-C 03/17/2023 Morning Active Self, Pharmacy Records  budesonide-formoterol [facility]) 160-4.5 MCG/ACT inhaler [id number] No Inhale 2 puffs into the lungs in the morning and at bedtime. Rinse after use. Joaquim Nam, MD 03/17/2023 Morning Active Self, Pharmacy Records  Cholecalciferol (VITAMIN D3) 25 MCG (1000 UT) CAPS [id number] No Take 1 capsule (1,000 Units total) by mouth daily. Joaquim Nam, MD 03/17/2023 Morning Active Self, Pharmacy Records  diclofenac Sodium (VOLTAREN) 1 % GEL [id number] No Apply 2 g topically 4 (four) times daily. Joaquim Nam, MD Not Taking Active Self, Pharmacy Records           Med Note [facility] [place], [place] A   Fri Mar 17, 2023  6:35 PM) PRN  furosemide (LASIX) 20 MG tablet [id number]  TAKE 1.5 TABLETS BY MOUTH IN THE  MORNING IF NEEDED FOR SWELLING Joaquim Nam, MD  Active Self, Pharmacy Records  ipratropium-albuterol (DUONEB) 0.5-2.5 (3) MG/3ML SOLN [id number]  Take 3 mLs by nebulization every 6 (six) hours as needed. Maretta Bees, MD  Active   levothyroxine (SYNTHROID) 50 MCG tablet [id number] No TAKE 1 TABLET BY MOUTH DAILY  EXCEPT 2 TABLETS ON SUNDAYS AND  WEDNESDAYS ( TOTAL 9 TABS PER  WEEK) Joaquim Nam, MD 03/17/2023 Morning Active Self, Pharmacy Records           Med Note [facility] [place], [place] A   Fri Mar 17, 2023  6:33 PM) Pt adamant of taking this medication. Dispense report does not support this claim.  melatonin 5 MG TABS [id number] No Take 5 mg by  mouth at bedtime as needed (For sleep). [provider] Not Taking Active Self, Pharmacy Records  methocarbamol (ROBAXIN) 500 MG tablet [id number] No Take 1 tablet (500 mg total) by mouth 2 (two) times daily as needed for muscle spasms. Joaquim Nam, MD Taking Active Self, Pharmacy Records  metoprolol tartrate (LOPRESSOR) 100 MG tablet [id number] No TAKE 1/2 TABLET IN THE  MORNING AND 1 TABLET IN THE  EVENING IF BP IS ABOVE 140/90.  Off med as of 11/01/22  Patient not taking: Reported on 01/06/2023   Joaquim Nam, MD Not Taking Active Self, Pharmacy Records  predniSONE (DELTASONE) 10 MG tablet [id number]  Take 4 tablets (40 mg total) by mouth daily for 1 day, THEN 3 tablets (30 mg total) daily for 1 day, THEN 2 tablets (20 mg total) daily for 1 day, THEN 1 tablet (10 mg total) daily for 1 day then stop Maretta Bees, MD  Active   rosuvastatin (CRESTOR) 20 MG tablet [id number] No TAKE 1 TABLET BY MOUTH DAILY Joaquim Nam, MD 03/17/2023 Morning Active Self, Pharmacy Records  tiotropium (SPIRIVA HANDIHALER) 18 MCG inhalation capsule [id number]  Place 1 capsule (18 mcg total) into inhaler and inhale daily. Maretta Bees, MD  Active   vitamin B-12 (CYANOCOBALAMIN) 500 MCG tablet [id number] No Take 500 mcg by mouth daily. [provider] 03/17/2023 Active Self, Pharmacy Records  XARELTO 2.5 MG TABS tablet [id number] No TAKE 1 TABLET BY MOUTH TWICE  DAILY Joaquim Nam, MD 03/17/2023  7:00 AM Active Self, Pharmacy Records            Home Care and Equipment/Supplies: Were Home Health Services Ordered?: Yes Name of Home Health Agency:: [facility] Has Agency set up a time to come to your home?: No EMR reviewed for Home Health Orders: Orders present/patient has not received call (refer to CM for follow-up) Any new equipment or medical supplies ordered?: Yes Name of Medical supply agency?: [employer] Were you able to get the equipment/medical supplies?: Yes Do you have any questions related to the use of the equipment/supplies?: No  Functional Questionnaire: Do you need assistance with bathing/showering or dressing?: Yes Do you need assistance with meal preparation?: Yes Do you need assistance with eating?: No Do you have difficulty maintaining continence: No Do you need assistance with getting out of bed/getting out of a chair/moving?: No Do  you have difficulty managing or taking your medications?: Yes  Follow up appointments reviewed: PCP Follow-up appointment confirmed?: Yes Date of PCP follow-up appointment?: 03/30/23 Follow-up Provider: Crawford Givens Specialist [facility] Follow-up appointment confirmed?: NA Do you need transportation to your follow-up appointment?: No Do you understand care options if your condition(s) worsen?: Yes-patient verbalized understanding  SDOH Interventions Today    Flowsheet Row Most Recent Value  SDOH Interventions   Food Insecurity Interventions Intervention Not Indicated  Housing Interventions Intervention Not Indicated  Transportation Interventions Intervention Not Indicated  Utilities Interventions Intervention Not Indicated  Social Connections Interventions Intervention Not Indicated      Interventions Today    Flowsheet Row Most Recent Value  Chronic Disease   Chronic disease during today's visit Chronic Obstructive Pulmonary Disease (COPD)  General Interventions   General Interventions Discussed/Reviewed General Interventions Discussed, Durable Medical Equipment (DME)  Doctor Visits Discussed/Reviewed Doctor Visits Discussed  Durable Medical Equipment (DME) Dan Humphreys  Exercise Interventions   Exercise Discussed/Reviewed Physical Activity  Physical Activity Discussed/Reviewed Physical Activity Discussed  Education Interventions   Education Provided Provided Education  Provided Verbal Education  On When to see the doctor, Medication, Insurance Plans  Pharmacy Interventions   Pharmacy Dicussed/Reviewed Medications and their functions  Safety Interventions   Safety Discussed/Reviewed Fall Risk       TOC Outreach completed today. The patient was discharged from the hospital 03/22/23 due to COPD exacerbation. She was started on steroids and oxygen. She was discharged home without oxygen and states she feels much better. New medications of Spiriva and nebulizer reviewed. Frances Furbish has  been notified of SOC but has not called yet. The patient states she feels weak and is looking forward to PT. The patient currently follow with the POC nurse. Declines TOC Outreach.  The patient has been provided with contact information for the care management team and has been advised to call with any health-related questions or concerns. The patient verbalized understanding with current POC. The patient is directed to their insurance card regarding availability of benefits coverage.  Deidre Ala, BSN, RN [place]  [facility] - [employer] [facility] RN Care Manager [phone]

## 2023-03-24 ENCOUNTER — Ambulatory Visit: Payer: Medicare Other | Admitting: Family Medicine

## 2023-03-27 ENCOUNTER — Other Ambulatory Visit (INDEPENDENT_AMBULATORY_CARE_PROVIDER_SITE_OTHER): Payer: Medicare Other

## 2023-03-27 DIAGNOSIS — D649 Anemia, unspecified: Secondary | ICD-10-CM

## 2023-03-27 LAB — CBC WITH DIFFERENTIAL/PLATELET
Basophils Absolute: 0 10*3/uL (ref 0.0–0.1)
Basophils Relative: 0.2 % (ref 0.0–3.0)
Eosinophils Absolute: 0.1 10*3/uL (ref 0.0–0.7)
Eosinophils Relative: 0.8 % (ref 0.0–5.0)
HCT: 50.4 % — ABNORMAL HIGH (ref 36.0–46.0)
Hemoglobin: 16.7 g/dL — ABNORMAL HIGH (ref 12.0–15.0)
Lymphocytes Relative: 8.2 % — ABNORMAL LOW (ref 12.0–46.0)
Lymphs Abs: 1 10*3/uL (ref 0.7–4.0)
MCHC: 33.1 g/dL (ref 30.0–36.0)
MCV: 96.5 fL (ref 78.0–100.0)
Monocytes Absolute: 1.1 10*3/uL — ABNORMAL HIGH (ref 0.1–1.0)
Monocytes Relative: 9.5 % (ref 3.0–12.0)
Neutro Abs: 9.7 10*3/uL — ABNORMAL HIGH (ref 1.4–7.7)
Neutrophils Relative %: 81.3 % — ABNORMAL HIGH (ref 43.0–77.0)
Platelets: 196 10*3/uL (ref 150.0–400.0)
RBC: 5.22 Mil/uL — ABNORMAL HIGH (ref 3.87–5.11)
RDW: 13.8 % (ref 11.5–15.5)
WBC: 11.9 10*3/uL — ABNORMAL HIGH (ref 4.0–10.5)

## 2023-03-27 LAB — COMPREHENSIVE METABOLIC PANEL
ALT: 49 U/L — ABNORMAL HIGH (ref 0–35)
AST: 29 U/L (ref 0–37)
Albumin: 3.8 g/dL (ref 3.5–5.2)
Alkaline Phosphatase: 69 U/L (ref 39–117)
BUN: 21 mg/dL (ref 6–23)
CO2: 30 meq/L (ref 19–32)
Calcium: 9.1 mg/dL (ref 8.4–10.5)
Chloride: 99 meq/L (ref 96–112)
Creatinine, Ser: 0.84 mg/dL (ref 0.40–1.20)
GFR: 62.73 mL/min (ref >60.00)
Glucose, Bld: 97 mg/dL (ref 70–99)
Potassium: 3.3 meq/L — ABNORMAL LOW (ref 3.5–5.1)
Sodium: 139 meq/L (ref 135–145)
Total Bilirubin: 1 mg/dL (ref 0.2–1.2)
Total Protein: 5.9 g/dL — ABNORMAL LOW (ref 6.0–8.3)

## 2023-03-27 LAB — IRON: Iron: 185 ug/dL — ABNORMAL HIGH (ref 42–145)

## 2023-03-27 LAB — VITAMIN B12: Vitamin B-12: >1537 pg/mL — ABNORMAL HIGH (ref 211–911)

## 2023-03-27 LAB — FERRITIN: Ferritin: 105.6 ng/mL (ref 10.0–291.0)

## 2023-03-30 ENCOUNTER — Inpatient Hospital Stay: Payer: Medicare Other | Admitting: Family Medicine

## 2023-04-04 ENCOUNTER — Inpatient Hospital Stay: Payer: Medicare Other | Admitting: Family Medicine

## 2023-04-06 ENCOUNTER — Other Ambulatory Visit: Payer: Self-pay | Admitting: Family Medicine

## 2023-04-13 ENCOUNTER — Inpatient Hospital Stay: Payer: Medicare Other | Admitting: Family Medicine

## 2023-04-13 ENCOUNTER — Other Ambulatory Visit: Payer: Self-pay | Admitting: Family Medicine

## 2023-04-13 DIAGNOSIS — R0600 Dyspnea, unspecified: Secondary | ICD-10-CM

## 2023-04-13 NOTE — Telephone Encounter (Signed)
Please verify current dose and use of lasix.  Let me know and I can work on the rx.  Thanks.

## 2023-04-13 NOTE — Telephone Encounter (Signed)
Last refill:  Furosemide 20 MG Oral Tablet 02/20/23 90 tablets 1 refill Last office visit: 12/26/22 Next office visit: nothing scheduled

## 2023-04-14 NOTE — Telephone Encounter (Signed)
Patient states that she is taking 1.5 tablet daily but her feet is still swollen.

## 2023-04-14 NOTE — Telephone Encounter (Signed)
 Patient notified

## 2023-04-14 NOTE — Telephone Encounter (Signed)
Rx sent.  I would try taking 2 tabs a day to see if that helps with swelling, assuming she isn't lightheaded. Thanks.

## 2023-04-18 ENCOUNTER — Ambulatory Visit: Payer: Self-pay

## 2023-04-18 NOTE — Patient Outreach (Signed)
 Care Coordination   Follow Up Visit Note   04/18/2023 Name: Jill Franco MRN: [id number] DOB: Feb 04, 1935  Jill Franco is a 89 y.o. year old female who sees Joaquim Nam, MD for primary care. I spoke with  [name] by phone today.  What matters to the patients health and wellness today?  Per chart review patient hospitalized from 03/17/23 to 03/22/23 for COPD exacerbation.  Patient states she hasn't smoked since discharge from the hospital and she is using the patch to help with smoking cessation.  Patient states she is doing great. Denies any SOB or cough.  She states the swelling in her LE has not gone down. She states she is taking 2 tablets per day of her lasix as instructed by her primary provider office. Per chart review patient is scheduled for her post hospital follow up with her primary provider on 04/24/23.  She states she had an earlier appointment but had to cancel due to the snow.  Patient states she continues to use her nebulizer 1 x per day. She states she is getting low on the duoneb medication and wants to know if her primary provider wants her to continue to use it.  Patient states her blood pressures were very high in the hospital. She reports her home blood pressure readings are ranging from 120-160's/ 60-90's.  Patient states she had blood work done at her primary office on 03/27/23.  Patient states she elected not to have home health services due to her daughter's dog being aggressive.    Goals Addressed             This Visit's Progress    Patient stated goal: "I'd like to focus on my COPD and health conditions"       Interventions Today    Flowsheet Row Most Recent Value  Chronic Disease   Chronic disease during today's visit Chronic Obstructive Pulmonary Disease (COPD), Other  [bilateral LE swelling]  General Interventions   General Interventions Discussed/Reviewed General Interventions Reviewed, Doctor Visits, Labs  [evaluation of current treatment plan  for listed health conditions and patients adherence to plan as established by provider. Assessed for COPD symptoms/ bp readings and increase swelling in LE.]  Doctor Visits Discussed/Reviewed Doctor Visits Reviewed  Annabell Sabal upcoming provider visits. Advised to keep follow up visits with providers as recommended. Confirmed patient has transportation to provider visits.]  Education Interventions   Education Provided Provided Education  [Advised to take recorded BP readings to provider visits on 04/24/23. Reviewed COPD symptoms. Advised to notify provider for increase symptoms/ call 911 for severe symptoms. Inquired if receiving home health services as ordered post hospital discharge.]  Pharmacy Interventions   Pharmacy Dicussed/Reviewed Pharmacy Topics Reviewed  [medication list reviewed and compared with recent hospital after visit summary.  Message sent to Dr. Para March regarding ipratropium and need for refill.]  Safety Interventions   Safety Discussed/Reviewed Fall Risk  [Assessed for falls. Fall prevention discussed.]                SDOH assessments and interventions completed:  No     Care Coordination Interventions:  Yes, provided   Follow up plan: Follow up call scheduled for 04/27/23 at 11:30 am    Encounter Outcome:  Patient Visit Completed   George Ina RN, BSN, CCM [place]  [facility], Population Health Case Manager Phone: [phone]

## 2023-04-18 NOTE — Patient Instructions (Signed)
 Visit Information  Thank you for taking time to visit with me today. Please don't hesitate to contact me if I can be of assistance to you.   Following are the goals we discussed today:   Goals Addressed             This Visit's Progress    Patient stated goal: "I'd like to focus on my COPD and health conditions"       Interventions Today    Flowsheet Row Most Recent Value  Chronic Disease   Chronic disease during today's visit Chronic Obstructive Pulmonary Disease (COPD), Other  [bilateral LE swelling]  General Interventions   General Interventions Discussed/Reviewed General Interventions Reviewed, Doctor Visits, Labs  [evaluation of current treatment plan for listed health conditions and patients adherence to plan as established by provider. Assessed for COPD symptoms/ bp readings and increase swelling in LE.]  Doctor Visits Discussed/Reviewed Doctor Visits Reviewed  Annabell Sabal upcoming provider visits. Advised to keep follow up visits with providers as recommended. Confirmed patient has transportation to provider visits.]  Education Interventions   Education Provided Provided Education  [Advised to take recorded BP readings to provider visits on 04/24/23. Reviewed COPD symptoms. Advised to notify provider for increase symptoms/ call 911 for severe symptoms. Inquired if receiving home health services as ordered post hospital discharge.]  Pharmacy Interventions   Pharmacy Dicussed/Reviewed Pharmacy Topics Reviewed  [medication list reviewed and compared with recent hospital after visit summary.  Message sent to Dr. Para March regarding ipratropium and need for refill.]  Safety Interventions   Safety Discussed/Reviewed Fall Risk  [Assessed for falls. Fall prevention discussed.]                Our next appointment is by telephone on 04/27/23 at 11:30 am  Please call the care guide team at [phone] if you need to cancel or reschedule your appointment.   If you are experiencing a  Mental Health or Behavioral Health Crisis or need someone to talk to, please call the Suicide and Crisis Lifeline: 988 call 1-800-273-TALK (toll free, 24 hour hotline)  Patient verbalizes understanding of instructions and care plan provided today and agrees to view in MyChart. Active MyChart status and patient understanding of how to access instructions and care plan via MyChart confirmed with patient.     George Ina RN, BSN, CCM [employer], Population Health Case Manager Phone: [phone]

## 2023-04-24 ENCOUNTER — Ambulatory Visit (INDEPENDENT_AMBULATORY_CARE_PROVIDER_SITE_OTHER): Payer: Medicare Other | Admitting: Family Medicine

## 2023-04-24 ENCOUNTER — Encounter: Payer: Self-pay | Admitting: Family Medicine

## 2023-04-24 VITALS — BP 134/68 | HR 74 | Temp 98.0°F | Ht 64.0 in | Wt 102.0 lb

## 2023-04-24 DIAGNOSIS — R7989 Other specified abnormal findings of blood chemistry: Secondary | ICD-10-CM | POA: Diagnosis not present

## 2023-04-24 DIAGNOSIS — Q178 Other specified congenital malformations of ear: Secondary | ICD-10-CM | POA: Diagnosis not present

## 2023-04-24 DIAGNOSIS — F17200 Nicotine dependence, unspecified, uncomplicated: Secondary | ICD-10-CM | POA: Diagnosis not present

## 2023-04-24 DIAGNOSIS — J449 Chronic obstructive pulmonary disease, unspecified: Secondary | ICD-10-CM | POA: Diagnosis not present

## 2023-04-24 DIAGNOSIS — Q049 Congenital malformation of brain, unspecified: Secondary | ICD-10-CM | POA: Diagnosis not present

## 2023-04-24 DIAGNOSIS — M810 Age-related osteoporosis without current pathological fracture: Secondary | ICD-10-CM

## 2023-04-24 DIAGNOSIS — Q789 Osteochondrodysplasia, unspecified: Secondary | ICD-10-CM | POA: Diagnosis not present

## 2023-04-24 DIAGNOSIS — R0600 Dyspnea, unspecified: Secondary | ICD-10-CM

## 2023-04-24 DIAGNOSIS — Q159 Congenital malformation of eye, unspecified: Secondary | ICD-10-CM | POA: Diagnosis not present

## 2023-04-24 DIAGNOSIS — I10 Essential (primary) hypertension: Secondary | ICD-10-CM | POA: Diagnosis not present

## 2023-04-24 DIAGNOSIS — K009 Disorder of tooth development, unspecified: Secondary | ICD-10-CM | POA: Diagnosis not present

## 2023-04-24 DIAGNOSIS — Q8789 Other specified congenital malformation syndromes, not elsewhere classified: Secondary | ICD-10-CM

## 2023-04-24 LAB — COMPREHENSIVE METABOLIC PANEL
ALT: 17 U/L (ref 0–35)
AST: 19 U/L (ref 0–37)
Albumin: 3.6 g/dL (ref 3.5–5.2)
Alkaline Phosphatase: 65 U/L (ref 39–117)
BUN: 22 mg/dL (ref 6–23)
CO2: 32 meq/L (ref 19–32)
Calcium: 9.9 mg/dL (ref 8.4–10.5)
Chloride: 101 meq/L (ref 96–112)
Creatinine, Ser: 0.63 mg/dL (ref 0.40–1.20)
GFR: 80.04 mL/min (ref >60.00)
Glucose, Bld: 97 mg/dL (ref 70–99)
Potassium: 4.7 meq/L (ref 3.5–5.1)
Sodium: 141 meq/L (ref 135–145)
Total Bilirubin: 0.5 mg/dL (ref 0.2–1.2)
Total Protein: 6.2 g/dL (ref 6.0–8.3)

## 2023-04-24 LAB — CBC WITH DIFFERENTIAL/PLATELET
Basophils Absolute: 0.1 10*3/uL (ref 0.0–0.1)
Basophils Relative: 1 % (ref 0.0–3.0)
Eosinophils Absolute: 0.1 10*3/uL (ref 0.0–0.7)
Eosinophils Relative: 1 % (ref 0.0–5.0)
HCT: 42.1 % (ref 36.0–46.0)
Hemoglobin: 14 g/dL (ref 12.0–15.0)
Lymphocytes Relative: 8 % — ABNORMAL LOW (ref 12.0–46.0)
Lymphs Abs: 0.6 10*3/uL — ABNORMAL LOW (ref 0.7–4.0)
MCHC: 33.4 g/dL (ref 30.0–36.0)
MCV: 96.7 fl (ref 78.0–100.0)
Monocytes Absolute: 1.1 10*3/uL — ABNORMAL HIGH (ref 0.1–1.0)
Monocytes Relative: 14.9 % — ABNORMAL HIGH (ref 3.0–12.0)
Neutro Abs: 5.3 10*3/uL (ref 1.4–7.7)
Neutrophils Relative %: 75.1 % (ref 43.0–77.0)
Platelets: 331 10*3/uL (ref 150.0–400.0)
RBC: 4.35 Mil/uL (ref 3.87–5.11)
RDW: 14.1 % (ref 11.5–15.5)
WBC: 7.1 10*3/uL (ref 4.0–10.5)

## 2023-04-24 MED ORDER — FUROSEMIDE 20 MG PO TABS: 40.0000 mg | ORAL_TABLET | Freq: Every day | ORAL | Status: DC

## 2023-04-24 MED ORDER — FUROSEMIDE 20 MG PO TABS: 60.0000 mg | ORAL_TABLET | Freq: Every day | ORAL | Status: DC

## 2023-04-24 MED ORDER — TIOTROPIUM BROMIDE MONOHYDRATE 18 MCG IN CAPS: 18.0000 ug | ORAL_CAPSULE | Freq: Every day | RESPIRATORY_TRACT | 3 refills | Status: DC

## 2023-04-24 NOTE — Patient Instructions (Addendum)
 You could try using flonase nasal spray on the skin and let it dry before you put on the patch.   Alternate arms.   You could use hydrocortisone cream on the area when you are not using a patch.    Try flavoring the water to take fosamax.    Restart spiriva if needed.  Continue symbicort and use albuterol if needed.   Go to the lab on the way out.   If you have mychart we'll likely use that to update you.     Try taking 3 lasix tabs in the AM for a few days and see if that helps with the swelling.  If doing better, then cut back to 2 a day for a few days.    Take care.  Glad to see you.

## 2023-04-24 NOTE — Progress Notes (Unsigned)
 Inpatient course d/w pt:   Brief Summary: 89 year old with COPD-presented with worsening shortness of breath-found to have COPD exacerbation and admitted to the hospitalist service.   Significant studies 1/24>> COVID/influenza/RSV: Negative 1/26>> CT angio chest: No PE 1/26>> TTE: EF 75%, grade 1 diastolic dysfunction.   Brief Hospital Course: COPD exacerbation Treated with IV steroids/bronchodilators-with significant improvement-she feels she is back to her baseline-on room air this morning Plan is to transition to oral prednisone-continue Symbicort-will add Spiriva.  She will continue to use albuterol inhaler for rescue.  At family's request-will provide her with  nebulizer-and will use nebulized bronchodilators as a second line for rescue.   Hypothyroidism Synthroid   GERD PPI   PAD Has required numerous procedures including left femoral endarterectomy/profundoplasty in the past. Continue Xarelto/aspirin   HLD Statin   Nutrition Status: Nutrition Problem: Increased nutrient needs Etiology: chronic illness Signs/Symptoms: estimated needs Interventions: Ensure Enlive (each supplement provides 350kcal and 20 grams of protein), Liberalize Diet, MVI   Note-daughter updated on day of discharge.   Discharge Diagnoses:  Principal Problem:   COPD exacerbation (HCC) Active Problems:   COPD with acute exacerbation (HCC)   Hypothyroidism   GERD without esophagitis   Nicotine dependence, cigarettes, uncomplicated   Anemia   Protein-calorie malnutrition, severe (HCC)   HLD (hyperlipidemia)   Decreased pedal pulses   ================================= She clearly feels better.  She is still on symbicort but off spiriva recently.  D/w pt about options, to have spiriva on hand in case she needed to restart.  She isn't smoking, d/w pt.  She is using nicotine patch but having local irritation on the skin.  D/w pt about options.  See AVS.    She is off metoprolol in the meantime.   Has been off for months.  She had been taking 2 lasix per day but still having BLE edema.   She had trouble taking a full glass of water with fosamax.  D/w pt about trying to flavor water some, ie with some lemon.  Swallowing fosamax itself wasn't the issue.    HGB, iron, B12 elevated. She is off iron and B12.    Prev ALT 49, d/w pt.  Recheck pending.  See notes on labs.   Meds, vitals, and allergies reviewed.   ROS: Per HPI unless specifically indicated in ROS section   Nad Ncat Neck supple, no LA Rrr Ctab Abd soft, not ttp 1+ BLE edema.   B upper arm with square blanching rash at prev side of nicotine patch.

## 2023-04-26 ENCOUNTER — Encounter: Payer: Self-pay | Admitting: Family Medicine

## 2023-04-26 DIAGNOSIS — J449 Chronic obstructive pulmonary disease, unspecified: Secondary | ICD-10-CM | POA: Insufficient documentation

## 2023-04-26 NOTE — Assessment & Plan Note (Signed)
 With BLE edena, can try taking 3 lasix tabs in the AM for a few days and see if that helps with the swelling.  If doing better, then cut back to 2 a day for a few days.  See notes on labs.

## 2023-04-26 NOTE — Assessment & Plan Note (Signed)
 Asked patient to try flavoring the water to take fosamax.

## 2023-04-26 NOTE — Assessment & Plan Note (Signed)
 She feels better, not smoking.  Continue symbicort with prn SABA.  Can restart spiriva if needed.

## 2023-04-26 NOTE — Assessment & Plan Note (Signed)
 See notes on labs.

## 2023-04-26 NOTE — Assessment & Plan Note (Signed)
 H/o, not smoking now, d/w pt about patch use options.  She could try using flonase nasal spray on the skin and let it dry before she put on the patch.   Alternate arms.   She could use hydrocortisone cream on the area when not using a patch.

## 2023-04-27 ENCOUNTER — Ambulatory Visit: Payer: Self-pay

## 2023-04-27 NOTE — Patient Outreach (Signed)
 Care Coordination   Follow Up Visit Note   04/27/2023 Name: Jill Franco MRN: [id number] DOB: 1934/08/10  Jill Franco is a 89 y.o. year old female who sees Joaquim Nam, MD for primary care. I spoke with  Jill Franco by phone today.  What matters to the patients health and wellness today?  Patient states she is doing very well. She denies having any issues with SOB. She states she has not smoked since prior to going in the hospital recently. Patient states she is wearing the smoking cessation patch as recommended by her provider.  She states the doctor ordered Spiriva which came in the mail on yesterday.  Patient states she is taking all medications specifically all COPD medications.  Patient reports having red places on her feet and the front of her legs up to her knees.   She said the red areas are very sore and sensitive to touch.  Patient states the red areas are dark looking like bruising and denies weeping/ open areas.   Patient states the swelling in her feet / ankles/ legs have gone down quite a bit today. She states she took the recommended increase of 3 lasix over the last 3 days as advised by her primary care provider.   This RN case manager spoke with Ardeth Sportsman at provider office who scheduled patient visit with primary provider red, sore spots on legs/ feet for 04/28/23 at 11 am with Dr. Kerby Nora.  Patient notified of date and time of appointment. Patient given name of provider.    Goals Addressed             This Visit's Progress    Patient stated goal: "I'd like to focus on my COPD and health conditions"       Interventions Today    Flowsheet Row Most Recent Value  Chronic Disease   Chronic disease during today's visit Chronic Obstructive Pulmonary Disease (COPD), Other  [red/painful spots on front of lower legs and feet.]  General Interventions   General Interventions Discussed/Reviewed General Interventions Reviewed, Doctor Visits, Communication with   [evaluation of current treatment plan for listed health conditions and patients adherence to plan as established by provider. Assessed for increase in COPD symptoms. Assessed new symptoms of red spots on legs/ feet / pain]  Doctor Visits Discussed/Reviewed Doctor Visits Reviewed  [confirmed patient had post hospital follow up visit with primary care provider.  Reviewed primary provider post hospital follow up visit 04/24/23. Reviewed upcoming provider visits and advised to keep follow up visits as recommended.]  Communication with PCP/Specialists  [Scheduled patient to see primary provider on 04/27/23 for painful red areas on lower extremities and feet.]  Education Interventions   Education Provided Provided Education  [Confirmed patient compliant with primary provider treatment plan as discussed on 04/24/23. Reviewed COPD symptoms. Advised to notify provider for increase symptoms or call 911 for severe symptoms.]  Provided Verbal Education On Other  [Discussed patients adherence to smoking cessation and confirmed patient wearing smoking cessation patch as recommended by provider. Reviewed infection symptoms. Advised to monitor legs/ feet for infection symptoms. Advised to call provider with symptoms.]  Pharmacy Interventions   Pharmacy Dicussed/Reviewed Pharmacy Topics Reviewed  [confirmed patient has all medications including newly prescribed and takes them as prescribed. Compliance discussed and advised. Confirmed patient able to afford medications at this time. Confirmed patient has nebulizer.]              SDOH assessments and interventions completed:  No     Care Coordination Interventions:  Yes, provided   Follow up plan: Follow up call scheduled for 06/06/23 at 2 pm    Encounter Outcome:  Patient Visit Completed   George Ina RN, BSN, CCM [place]  [facility], Population Health Case Manager Phone: [phone]

## 2023-04-27 NOTE — Patient Instructions (Signed)
 Visit Information  Thank you for taking time to visit with me today. Please don't hesitate to contact me if I can be of assistance to you.   Following are the goals we discussed today:   Goals Addressed             This Visit's Progress    Patient stated goal: "I'd like to focus on my COPD and health conditions"       Interventions Today    Flowsheet Row Most Recent Value  Chronic Disease   Chronic disease during today's visit Chronic Obstructive Pulmonary Disease (COPD), Other  [red/painful spots on front of lower legs and feet.]  General Interventions   General Interventions Discussed/Reviewed General Interventions Reviewed, Doctor Visits, Communication with  [evaluation of current treatment plan for listed health conditions and patients adherence to plan as established by provider. Assessed for increase in COPD symptoms. Assessed new symptoms of red spots on legs/ feet / pain]  Doctor Visits Discussed/Reviewed Doctor Visits Reviewed  [confirmed patient had post hospital follow up visit with primary care provider.  Reviewed primary provider post hospital follow up visit 04/24/23. Reviewed upcoming provider visits and advised to keep follow up visits as recommended.]  Communication with PCP/Specialists  [Scheduled patient to see primary provider on 04/27/23 for painful red areas on lower extremities and feet.]  Education Interventions   Education Provided Provided Education  [Confirmed patient compliant with primary provider treatment plan as discussed on 04/24/23. Reviewed COPD symptoms. Advised to notify provider for increase symptoms or call 911 for severe symptoms.]  Provided Verbal Education On Other  [Discussed patients adherence to smoking cessation and confirmed patient wearing smoking cessation patch as recommended by provider. Reviewed infection symptoms. Advised to monitor legs/ feet for infection symptoms. Advised to call provider with symptoms.]  Pharmacy Interventions   Pharmacy  Dicussed/Reviewed Pharmacy Topics Reviewed  [confirmed patient has all medications including newly prescribed and takes them as prescribed. Compliance discussed and advised. Confirmed patient able to afford medications at this time. Confirmed patient has nebulizer.]              Our next appointment is by telephone on 06/06/23 at 2 pm  Please call the care guide team at [phone] if you need to cancel or reschedule your appointment.   If you are experiencing a Mental Health or Behavioral Health Crisis or need someone to talk to, please call the Suicide and Crisis Lifeline: 988 call 1-800-273-TALK (toll free, 24 hour hotline)  Patient verbalizes understanding of instructions and care plan provided today and agrees to view in MyChart. Active MyChart status and patient understanding of how to access instructions and care plan via MyChart confirmed with patient.     George Ina RN, BSN, CCM [employer], Population Health Case Manager Phone: [phone]

## 2023-04-28 ENCOUNTER — Encounter: Payer: Self-pay | Admitting: Family Medicine

## 2023-04-28 ENCOUNTER — Ambulatory Visit: Admitting: Family Medicine

## 2023-04-28 VITALS — BP 140/70 | HR 56 | Temp 98.3°F | Ht 64.0 in | Wt 102.1 lb

## 2023-04-28 DIAGNOSIS — L97921 Non-pressure chronic ulcer of unspecified part of left lower leg limited to breakdown of skin: Secondary | ICD-10-CM

## 2023-04-28 DIAGNOSIS — I739 Peripheral vascular disease, unspecified: Secondary | ICD-10-CM | POA: Diagnosis not present

## 2023-04-28 DIAGNOSIS — L97911 Non-pressure chronic ulcer of unspecified part of right lower leg limited to breakdown of skin: Secondary | ICD-10-CM

## 2023-04-28 NOTE — Assessment & Plan Note (Signed)
 Chronic with history of past revascularization.  Will move forward with repeat ABIs since she has not seen her vascular doctor in approximately 4 years with new possible arterial ulcer on pinky toe.

## 2023-04-28 NOTE — Patient Instructions (Signed)
 Continue lasix 20 mg  at 3 tablets daily for an additional 3 days then return to 2 tablets daily.  Elevate feet above heart.  Wear compression hose.  Apply topical antibiotics ointment to sores.  We will move forward with peripheral artery retest.   Watch for odor, redness spreading or worsening.

## 2023-04-28 NOTE — Progress Notes (Signed)
 Patient ID: Jill Franco, female    DOB: 07-Jul-1934, 89 y.o.   MRN: [id number]  This visit was conducted in person.  BP (!) 140/70 (BP Location: Left Arm, Patient Position: Sitting, Cuff Size: Normal)   Pulse (!) 56   Temp 98.3 F (36.8 C) (Temporal)   Ht 5\' 4"  (1.626 m)   Wt 102 lb 2 oz (46.3 kg)   SpO2 96%   BMI 17.53 kg/m    CC:  Chief Complaint  Patient presents with   Foot Swelling   Sores on Foot    Subjective:   HPI: Jill Franco is a 89 y.o. female presenting on 04/28/2023 for Foot Swelling and Sores on Foot  History of PAD.. S/P  PAD re-vascularization.  Saw PCP 04/24/23.Marland Kitchen told to increase to 3 tablets daily of lasix for worsening swelling...  has done it for 3 days. Usually taking 2 tabs daily.  Swelling improved some but returned today.    She has chronic swelling in bilateral legs chronically, chronic discoloration.    Notes intermittent  sores on  on feet and ankle off and on in last  month  Usually gets better with topical antibiotic but has not this time.    Now nonhealing sore on left pinky toe, right anterior ankle  and  Left lateral ankle   No discharge. No fever, no flu like symptoms.  She treats with neosporin     BP Readings from Last 3 Encounters:  04/28/23 (!) 140/70  04/24/23 134/68  03/22/23 (!) 155/73     Relevant past medical, surgical, family and social history reviewed and updated as indicated. Interim medical history since our last visit reviewed. Allergies and medications reviewed and updated. Outpatient Medications Prior to Visit  Medication Sig Dispense Refill   acetaminophen (TYLENOL) 650 MG CR tablet Take 650 mg by mouth every 8 (eight) hours as needed for pain.     albuterol (VENTOLIN HFA) 108 (90 Base) MCG/ACT inhaler Inhale 2 puffs into the lungs every 6 (six) hours as needed for wheezing or shortness of breath. 34 g 2   alendronate (FOSAMAX) 70 MG tablet TAKE 1 TABLET BY MOUTH WEEKLY  WITH 8 OZ OF PLAIN WATER 30   MINUTES BEFORE FIRST FOOD, DRINK OR MEDS. STAY UPRIGHT FOR 30  MINS 12 tablet 3   aspirin EC 81 MG tablet Take 1 tablet (81 mg total) by mouth daily. Swallow whole. 90 tablet 3   budesonide-formoterol (SYMBICORT) 160-4.5 MCG/ACT inhaler Inhale 2 puffs into the lungs in the morning and at bedtime. Rinse after use. 30.6 g 3   Cholecalciferol (VITAMIN D3) 25 MCG (1000 UT) CAPS Take 1 capsule (1,000 Units total) by mouth daily.     diclofenac Sodium (VOLTAREN) 1 % GEL Apply 2 g topically 4 (four) times daily. 50 g 0   furosemide (LASIX) 20 MG tablet Take 2-3 tablets (40-60 mg total) by mouth daily.     ipratropium-albuterol (DUONEB) 0.5-2.5 (3) MG/3ML SOLN Take 3 mLs by nebulization every 6 (six) hours as needed. 360 mL 1   levothyroxine (SYNTHROID) 50 MCG tablet TAKE 1 TABLET BY MOUTH DAILY  EXCEPT 2 TABLETS ON SUNDAYS AND  WEDNESDAYS ( TOTAL 9 TABS PER  WEEK) 130 tablet 2   melatonin 5 MG TABS Take 5 mg by mouth at bedtime as needed (For sleep).     methocarbamol (ROBAXIN) 500 MG tablet Take 1 tablet (500 mg total) by mouth 2 (two) times daily as needed for muscle  spasms. 60 tablet 1   rosuvastatin (CRESTOR) 20 MG tablet TAKE 1 TABLET BY MOUTH DAILY 100 tablet 2   tiotropium (SPIRIVA HANDIHALER) 18 MCG inhalation capsule Place 1 capsule (18 mcg total) into inhaler and inhale daily. 30 capsule 3   XARELTO 2.5 MG TABS tablet TAKE 1 TABLET BY MOUTH TWICE  DAILY 200 tablet 2   No facility-administered medications prior to visit.     Per HPI unless specifically indicated in ROS section below Review of Systems  Constitutional:  Negative for fatigue and fever.  HENT:  Negative for congestion.   Eyes:  Negative for pain.  Respiratory:  Negative for cough and shortness of breath.   Cardiovascular:  Positive for leg swelling. Negative for chest pain and palpitations.  Gastrointestinal:  Negative for abdominal pain.  Genitourinary:  Negative for dysuria and vaginal bleeding.  Musculoskeletal:  Negative  for back pain.  Neurological:  Negative for syncope, light-headedness and headaches.  Psychiatric/Behavioral:  Negative for dysphoric mood.    Objective:  BP (!) 140/70 (BP Location: Left Arm, Patient Position: Sitting, Cuff Size: Normal)   Pulse (!) 56   Temp 98.3 F (36.8 C) (Temporal)   Ht 5\' 4"  (1.626 m)   Wt 102 lb 2 oz (46.3 kg)   SpO2 96%   BMI 17.53 kg/m   Wt Readings from Last 3 Encounters:  04/28/23 102 lb 2 oz (46.3 kg)  04/24/23 102 lb (46.3 kg)  12/26/22 100 lb 3.2 oz (45.5 kg)      Physical Exam Constitutional:      General: She is not in acute distress.    Appearance: Normal appearance. She is well-developed. She is not ill-appearing or toxic-appearing.  HENT:     Head: Normocephalic.     Right Ear: Hearing, tympanic membrane, ear canal and external ear normal. Tympanic membrane is not erythematous, retracted or bulging.     Left Ear: Hearing, tympanic membrane, ear canal and external ear normal. Tympanic membrane is not erythematous, retracted or bulging.     Nose: No mucosal edema or rhinorrhea.     Right Sinus: No maxillary sinus tenderness or frontal sinus tenderness.     Left Sinus: No maxillary sinus tenderness or frontal sinus tenderness.     Mouth/Throat:     Pharynx: Uvula midline.  Eyes:     General: Lids are normal. Lids are everted, no foreign bodies appreciated.     Conjunctiva/sclera: Conjunctivae normal.     Pupils: Pupils are equal, round, and reactive to light.  Neck:     Thyroid: No thyroid mass or thyromegaly.     Vascular: No carotid bruit.     Trachea: Trachea normal.  Cardiovascular:     Rate and Rhythm: Normal rate and regular rhythm.     Pulses: Normal pulses.     Heart sounds: Normal heart sounds, S1 normal and S2 normal. No murmur heard.    No friction rub. No gallop.     Comments:  Swelling in feet to ankle, none in lower legs Pulmonary:     Effort: Pulmonary effort is normal. No tachypnea or respiratory distress.     Breath  sounds: Normal breath sounds. No decreased breath sounds, wheezing, rhonchi or rales.  Abdominal:     General: Bowel sounds are normal.     Palpations: Abdomen is soft.     Tenderness: There is no abdominal tenderness.  Musculoskeletal:     Cervical back: Normal range of motion and neck supple.  Right lower leg: 2+ Edema present.     Left lower leg: 2+ Edema present.  Skin:    General: Skin is warm and dry.     Findings: No rash.     Comments:  Red areas with minimal skin breakdown  See photos.   Neurological:     Mental Status: She is alert.  Psychiatric:        Mood and Affect: Mood is not anxious or depressed.        Speech: Speech normal.        Behavior: Behavior normal. Behavior is cooperative.        Thought Content: Thought content normal.        Judgment: Judgment normal.              Results for orders placed or performed in visit on 04/24/23  CBC with Differential/Platelet   Collection Time: 04/24/23 11:25 AM  Result Value Ref Range   WBC 7.1 4.0 - 10.5 K/uL   RBC 4.35 3.87 - 5.11 Mil/uL   Hemoglobin 14.0 12.0 - 15.0 g/dL   HCT 16.1 09.6 - 04.5 %   MCV 96.7 78.0 - 100.0 fl   MCHC 33.4 30.0 - 36.0 g/dL   RDW 40.9 81.1 - 91.4 %   Platelets 331.0 150.0 - 400.0 K/uL   Neutrophils Relative % 75.1 43.0 - 77.0 %   Lymphocytes Relative 8.0 (L) 12.0 - 46.0 %   Monocytes Relative 14.9 (H) 3.0 - 12.0 %   Eosinophils Relative 1.0 0.0 - 5.0 %   Basophils Relative 1.0 0.0 - 3.0 %   Neutro Abs 5.3 1.4 - 7.7 K/uL   Lymphs Abs 0.6 (L) 0.7 - 4.0 K/uL   Monocytes Absolute 1.1 (H) 0.1 - 1.0 K/uL   Eosinophils Absolute 0.1 0.0 - 0.7 K/uL   Basophils Absolute 0.1 0.0 - 0.1 K/uL  Comprehensive metabolic panel   Collection Time: 04/24/23 11:25 AM  Result Value Ref Range   Sodium 141 135 - 145 mEq/L   Potassium 4.7 3.5 - 5.1 mEq/L   Chloride 101 96 - 112 mEq/L   CO2 32 19 - 32 mEq/L   Glucose, Bld 97 70 - 99 mg/dL   BUN 22 6 - 23 mg/dL   Creatinine, Ser 7.82 0.40  - 1.20 mg/dL   Total Bilirubin 0.5 0.2 - 1.2 mg/dL   Alkaline Phosphatase 65 39 - 117 U/L   AST 19 0 - 37 U/L   ALT 17 0 - 35 U/L   Total Protein 6.2 6.0 - 8.3 g/dL   Albumin 3.6 3.5 - 5.2 g/dL   GFR 95.62 >13.08 mL/min   Calcium 9.9 8.4 - 10.5 mg/dL    Assessment and Plan  Ulcers of both lower extremities, limited to breakdown of skin (HCC) Assessment & Plan: Acute, noted new ulcers on bilateral lower legs with qualities of both venous as well as arterial source. Patient does not have diabetes. Discussed further treatment of peripheral swelling to allow areas to heal.  There is no current ongoing infection seen.  Given she does have one of the ulcers appearing on her toe with her history of severe peripheral artery disease we will retest ABIs bilaterally. If this is negative ulcers are most likely secondary to venous insufficiency. Encouraged her to continue to apply topical antibiotic ointment, offload, contact, elevate legs above heart as able, she will start wearing compression hose up to calf. I will also have her do another 3 days of Lasix  60 mg (potassium on 04/24/2023 was high normal at 4.7), then she will return to her usual dosing of 40 mg daily.  If not improving as expected she will follow-up with her PCP.     PAD (peripheral artery disease) (HCC) Assessment & Plan: Chronic with history of past revascularization.  Will move forward with repeat ABIs since she has not seen her vascular doctor in approximately 4 years with new possible arterial ulcer on pinky toe.  Orders: -     VAS [place] ABI WITH/WO TBI; Future    Return if symptoms worsen or fail to improve.   Kerby Nora, MD

## 2023-04-28 NOTE — Assessment & Plan Note (Signed)
 Acute, noted new ulcers on bilateral lower legs with qualities of both venous as well as arterial source. Patient does not have diabetes. Discussed further treatment of peripheral swelling to allow areas to heal.  There is no current ongoing infection seen.  Given she does have one of the ulcers appearing on her toe with her history of severe peripheral artery disease we will retest ABIs bilaterally. If this is negative ulcers are most likely secondary to venous insufficiency. Encouraged her to continue to apply topical antibiotic ointment, offload, contact, elevate legs above heart as able, she will start wearing compression hose up to calf. I will also have her do another 3 days of Lasix 60 mg (potassium on 04/24/2023 was high normal at 4.7), then she will return to her usual dosing of 40 mg daily.  If not improving as expected she will follow-up with her PCP.

## 2023-04-29 ENCOUNTER — Telehealth: Payer: Self-pay | Admitting: Family Medicine

## 2023-04-29 DIAGNOSIS — I739 Peripheral vascular disease, unspecified: Secondary | ICD-10-CM

## 2023-04-29 NOTE — Telephone Encounter (Signed)
 Awaiting ABIs.  Please get update on patient re: her legs on 3/10 or 3/11.  Thanks.

## 2023-05-01 ENCOUNTER — Ambulatory Visit (HOSPITAL_COMMUNITY)
Admission: RE | Admit: 2023-05-01 | Discharge: 2023-05-01 | Disposition: A | Discharge status: Home / Self Care | Source: Ambulatory Visit | Attending: Family Medicine | Admitting: Family Medicine

## 2023-05-01 DIAGNOSIS — I739 Peripheral vascular disease, unspecified: Secondary | ICD-10-CM | POA: Diagnosis not present

## 2023-05-02 ENCOUNTER — Encounter: Payer: Self-pay | Admitting: Family Medicine

## 2023-05-02 LAB — VAS US ABI WITH/WO TBI
Left ABI: 0.52
Right ABI: 0.61

## 2023-05-02 NOTE — Telephone Encounter (Signed)
 Reached out to the patient to see how she is doing. She states that she went to the vein specialist on yesterday to see about some red places that were coming up on her feet. So she is waiting to hear back from them today. Her right knee has been bothering her a lot and although her feet are still swollen its not as bad. For 6 days she did 3 lasix and today she started 2 lasix.

## 2023-05-03 NOTE — Telephone Encounter (Signed)
 Please request the notes from the outside clinic.  Based on her labs, it would be okay to use 3 lasix a day as needed, if the swelling increases.  O/w, could use 2 per day.  Thanks.

## 2023-05-04 ENCOUNTER — Telehealth (HOSPITAL_COMMUNITY): Payer: Self-pay

## 2023-05-04 NOTE — Telephone Encounter (Signed)
 I thought she had some improvement with 3 per day.   If not, and if still with swelling that doesn't respond to elevation, she could try taking 40mg  BID for 1-2 days and see if that helped.    After 1-2 days, would change back to 2 tabs ie 40mg  once per day.   Would need f/u labs next week at a nonfasting lab visit.  I put in the orders.  I think it makes sense to follow up with the vascular clinic and if she can't get set up then please let me know.  Thanks.

## 2023-05-04 NOTE — Telephone Encounter (Signed)
 Ms. Pavlak notified as instructed by telephone.  Patient states understanding.  She will call Dr. Butch Penny office to schedule an appointment.

## 2023-05-04 NOTE — Addendum Note (Signed)
 Addended by: Joaquim Nam on: 05/04/2023 09:41 PM   Modules accepted: Orders

## 2023-05-04 NOTE — Telephone Encounter (Signed)
 Patient called complaining of no receipt of results from appointment on 05/01/23, patient would like someone to reach out with results. [phone] provided by patient for best contact.

## 2023-05-04 NOTE — Telephone Encounter (Signed)
 Message sent  on 05/02/2023  through MyChart   Please call pt with results:   The vascular studies to look at arterial blood flow to your legs bilaterally were abnormal.  They showed moderate amount of blockage bilaterally.  I would recommend returning to see your vascular doctor that you saw in the past.  I will forward the results to Dr. Para March as Lorain Childes.

## 2023-05-04 NOTE — Telephone Encounter (Signed)
 Called and spoke with patient, she had a lower extremity doppler study done on 03/11, this is in her chart, see under media the report.   Advised patient of Dr. Armanda Heritage message she stated she did 3 lasix for 6 days and the swelling did not decrease so she was advised by their office to decrease back to 2 tablets a day.

## 2023-05-08 NOTE — Telephone Encounter (Signed)
 Left voicemail for patient to return call to office.

## 2023-05-09 ENCOUNTER — Ambulatory Visit (INDEPENDENT_AMBULATORY_CARE_PROVIDER_SITE_OTHER): Admitting: Physician Assistant

## 2023-05-09 VITALS — BP 174/74 | HR 79 | Temp 98.2°F | Resp 22 | Ht 64.0 in | Wt 102.0 lb

## 2023-05-09 DIAGNOSIS — I739 Peripheral vascular disease, unspecified: Secondary | ICD-10-CM

## 2023-05-09 DIAGNOSIS — I6523 Occlusion and stenosis of bilateral carotid arteries: Secondary | ICD-10-CM | POA: Diagnosis not present

## 2023-05-09 NOTE — Progress Notes (Signed)
 HISTORY AND PHYSICAL     CC:  follow up. Requesting Provider:  Joaquim Nam, MD  HPI: This is a 89 y.o. female who is here today for follow up for PAD.  Pt has hx of left CIA stent and aortic stent on 03/18/2016 by Dr. Darrick Penna.  She underwent aortogram with right CIA, left CIA, left EIA and left femoral endarterectomy with profundoplasty and bovine patch angioplasty 12/03/2020 by Dr. Lenell Antu. She underwent aortogram with angioplasty of bilateral CIA, left EIA stents on 04/30/2021 by Dr. Lenell Antu for rest pain.  Pt was last seen 09/07/2021 and at that time, she was having return of her symptoms.  Dr Lenell Antu did not feel she was a candidate for reintervention and options going forward were ABF, which he did not feel she was a good candidate or axillo-bifemoral bypass.    The pt returns today for follow up and here with her daughter.  She states she was hospitalized in January b/c she couldn't breath.  She said it was the first time she ever felt like she was going to die.  She has quit smoking since that hospitalization.   She states that she gets swelling in both feet.  She states that it improves after she has been in bed overnight, not completely but nearly.  She is using a machine that keeps her legs moving while sitting.  She states this does make her legs feel better.  She has some red spots in the left foot that she has been putting neosporin on them. She does not have any open sores.  She continues to take her Xarelto and baby asa.    The pt is on a statin for cholesterol management.    The pt is on an aspirin.    Other AC:  Xarelto The pt is on diuretic for hypertension.  The pt is not on medication for diabetes. Tobacco hx:  former  Pt does not have family hx of AAA.  Past Medical History:  Diagnosis Date   Cataract    bilaterally corrected.   COPD (chronic obstructive pulmonary disease) (HCC)    CVA (cerebral vascular accident) (HCC)    Pt was unaware and does not know when it  happened.  Was told after some testing was done.   Double vision    Dyspnea    Facial droop 10/2017   left   History of shingles 2012   HLD (hyperlipidemia)    HTN (hypertension)    Hypothyroidism    PVD (peripheral vascular disease) (HCC)    Vision loss    temporary    Past Surgical History:  Procedure Laterality Date   ABDOMINAL AORTOGRAM N/A 04/30/2021   Procedure: ABDOMINAL AORTOGRAM;  Surgeon: Leonie Douglas, MD;  Location: [facility];  Service: Cardiovascular;  Laterality: N/A;   ABDOMINAL EXPLORATION SURGERY  1950s   "they thought I was pregnant; went in to explore"   APPENDECTOMY  childhood   BACK SURGERY     BREAST LUMPECTOMY Left 1960's   benign   carotid ultrasound  06/19/2007   0-39% stenosis   CATARACT EXTRACTION W/ INTRAOCULAR LENS IMPLANT Left 09/2016   CATARACT EXTRACTION W/ INTRAOCULAR LENS IMPLANT Right 10/2016   COLONOSCOPY WITH PROPOFOL N/A 04/08/2019   Procedure: COLONOSCOPY WITH PROPOFOL;  Surgeon: Rachael Fee, MD;  Location: [facility];  Service: Endoscopy;  Laterality: N/A;   DILATION AND CURETTAGE OF UTERUS  1960's X 3   miscarriages   DOPPLER ECHOCARDIOGRAPHY  06/19/2007  Normal EF 55-70%   ENDARTERECTOMY FEMORAL Left 12/03/2020   Procedure: LEFT FEMORAL ENDARTERECTOMY AND PROFUNDAPLASTY;  Surgeon: Leonie Douglas, MD;  Location: [facility];  Service: Vascular;  Laterality: Left;   ILIAC ARTERY STENT Left 03/18/2016   common iliac stent (6 x 40), aortic stent (10 x 19)/notes 03/18/2016   INSERTION OF ILIAC STENT Left 12/03/2020   Procedure: AORTIC STENTING, INSERTION OF BILATERAL COMMON ILIAC STENTS, AND LEFT EXTERNAL ILIAC STENT;  Surgeon: Leonie Douglas, MD;  Location: [facility];  Service: Vascular;  Laterality: Left;   LAMINECTOMY AND MICRODISCECTOMY SPINE  1960's X 2   LOWER EXTREMITY ANGIOGRAM N/A 12/03/2020   Procedure: Emeterio Reeve;  Surgeon: Leonie Douglas, MD;  Location: [facility];  Service: Vascular;  Laterality: N/A;   MOLE  REMOVAL  07/10/2016   NSVD     x1   PATCH ANGIOPLASTY Left 12/03/2020   Procedure: PATCH ANGIOPLASTY OF LEFT COMMON FEMORAL ARTERY USING BOVINE PERICARDIUM PATCH;  Surgeon: Leonie Douglas, MD;  Location: [facility];  Service: Vascular;  Laterality: Left;   PERIPHERAL VASCULAR BALLOON ANGIOPLASTY Right 04/30/2021   Procedure: PERIPHERAL VASCULAR BALLOON ANGIOPLASTY;  Surgeon: Leonie Douglas, MD;  [facility]: [facility];  Service: Cardiovascular;  Laterality: Right;  rt common iliac   PERIPHERAL VASCULAR CATHETERIZATION N/A 03/18/2016   Procedure: Abdominal Aortogram w/Lower Extremity;  Surgeon: Sherren Kerns, MD;  Location[facility] INVASIVE CV LAB;  Service: Cardiovascular;  Laterality: N/A;   PERIPHERAL VASCULAR CATHETERIZATION  03/18/2016   Procedure: Peripheral Vascular Intervention;  Surgeon: Sherren Kerns, MD;  Location: [facility] INVASIVE CV LAB;  Service: Cardiovascular;;   PERIPHERAL VASCULAR INTERVENTION Left 04/30/2021   Procedure: PERIPHERAL VASCULAR INTERVENTION;  Surgeon: Leonie Douglas, MD;  [facility]: [facility];  Service: Cardiovascular;  Laterality: Left;  lt common iliac   POLYPECTOMY  04/08/2019   Procedure: POLYPECTOMY;  Surgeon: Rachael Fee, MD;  Location: [facility];  Service: Endoscopy;;   ULTRASOUND GUIDANCE FOR VASCULAR ACCESS Right 12/03/2020   Procedure: ULTRASOUND GUIDANCE FOR VASCULAR ACCESS, RIGHT FEMORAL ARTERY;  Surgeon: Leonie Douglas, MD;  Location: [facility] OR;  Service: Vascular;  Laterality: Right;    Allergies  Allergen Reactions   Lipitor [Atorvastatin] Other (See Comments)    myalgia   Advil [Ibuprofen] Nausea And Vomiting   Aspirin Other (See Comments)    Uncoated aspirin causes stomach upset on empty stomach   Codeine Nausea Only and Other (See Comments)    Pill needs to be E.coated   Other Itching and Rash    Nicotine patches     Current Outpatient Medications  Medication Sig Dispense Refill   acetaminophen (TYLENOL) 650 MG CR tablet  Take 650 mg by mouth every 8 (eight) hours as needed for pain.     albuterol (VENTOLIN HFA) 108 (90 Base) MCG/ACT inhaler Inhale 2 puffs into the lungs every 6 (six) hours as needed for wheezing or shortness of breath. 34 g 2   alendronate (FOSAMAX) 70 MG tablet TAKE 1 TABLET BY MOUTH WEEKLY  WITH 8 OZ OF PLAIN WATER 30  MINUTES BEFORE FIRST FOOD, DRINK OR MEDS. STAY UPRIGHT FOR 30  MINS 12 tablet 3   aspirin EC 81 MG tablet Take 1 tablet (81 mg total) by mouth daily. Swallow whole. 90 tablet 3   budesonide-formoterol (SYMBICORT) 160-4.5 MCG/ACT inhaler Inhale 2 puffs into the lungs in the morning and at bedtime. Rinse after use. 30.6 g 3   Cholecalciferol (VITAMIN D3) 25  MCG (1000 UT) CAPS Take 1 capsule (1,000 Units total) by mouth daily.     diclofenac Sodium (VOLTAREN) 1 % GEL Apply 2 g topically 4 (four) times daily. 50 g 0   furosemide (LASIX) 20 MG tablet Take 2-3 tablets (40-60 mg total) by mouth daily.     ipratropium-albuterol (DUONEB) 0.5-2.5 (3) MG/3ML SOLN Take 3 mLs by nebulization every 6 (six) hours as needed. 360 mL 1   levothyroxine (SYNTHROID) 50 MCG tablet TAKE 1 TABLET BY MOUTH DAILY  EXCEPT 2 TABLETS ON SUNDAYS AND  WEDNESDAYS ( TOTAL 9 TABS PER  WEEK) 130 tablet 2   melatonin 5 MG TABS Take 5 mg by mouth at bedtime as needed (For sleep).     methocarbamol (ROBAXIN) 500 MG tablet Take 1 tablet (500 mg total) by mouth 2 (two) times daily as needed for muscle spasms. 60 tablet 1   rosuvastatin (CRESTOR) 20 MG tablet TAKE 1 TABLET BY MOUTH DAILY 100 tablet 2   tiotropium (SPIRIVA HANDIHALER) 18 MCG inhalation capsule Place 1 capsule (18 mcg total) into inhaler and inhale daily. 30 capsule 3   XARELTO 2.5 MG TABS tablet TAKE 1 TABLET BY MOUTH TWICE  DAILY 200 tablet 2   No current facility-administered medications for this visit.    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  Tobacco Use   Smoking status: Former    Current packs/day: 0.00    Average packs/day: 0.3 packs/day for 69.0 years (17.3 ttl pk-yrs)    Types: Cigarettes    Start date: 01/1952    Quit date: 01/2021    Years since quitting: 2.2    Passive exposure: Current   Smokeless tobacco: Never  Vaping Use   Vaping status: Never Used  Substance and Sexual Activity   Alcohol use: No    Alcohol/week: 0.0 standard drinks of alcohol   Drug use: No   Sexual activity: Not on file  Other Topics Concern   Not on file  Social History Narrative   From [place]   Retired 2007   Widowed 2008 after 49 years   Enjoys yard work, but needs some help with yard work now.     Her daughter is helping at home some (some as of 2022).  Her daughter has vision loss and is widowed as of 2022 and moved in with patient.   Social Drivers of Corporate investment banker Strain: Low Risk  (08/24/2022)   Overall Financial Resource Strain (CARDIA)    Difficulty of Paying Living Expenses: Not hard at all  Food Insecurity: No Food Insecurity (03/23/2023)   Hunger Vital Sign    Worried About Running Out of Food in the Last Year: Never true    Ran Out of Food in the Last Year: Never true  Transportation Needs: No Transportation Needs (03/23/2023)   PRAPARE -  Administrator, Civil Service (Medical): No    Lack of Transportation (Non-Medical): No  Physical Activity: Insufficiently Active (08/24/2022)   Exercise Vital Sign    Days of Exercise per Week: 3 days    Minutes of Exercise per Session: 30 min  Stress: No Stress Concern Present  (08/24/2022)   [employer] of Occupational Health - Occupational Stress Questionnaire    Feeling of Stress : Not at all  Social Connections: Socially Isolated (03/23/2023)   Social Connection and Isolation Panel [NHANES]    Frequency of Communication with Friends and Family: More than three times a week    Frequency of Social Gatherings with Friends and Family: More than three times a week    Attends Religious Services: Never    Database administrator or Organizations: No    Attends Banker Meetings: Never    Marital Status: Widowed  Intimate Partner Violence: Not At Risk (03/23/2023)   Humiliation, Afraid, Rape, and Kick questionnaire    Fear of Current or Ex-Partner: No    Emotionally Abused: No    Physically Abused: No    Sexually Abused: No     REVIEW OF SYSTEMS:   [X]  denotes positive finding, [ ]  denotes negative finding Cardiac  Comments:  Chest pain or chest pressure:    Shortness of breath upon exertion:    Short of breath when lying flat:    Irregular heart rhythm:        Vascular    Pain in calf, thigh, or hip brought on by ambulation:    Pain in feet at night that wakes you up from your sleep:     Blood clot in your veins:    Leg swelling:  x See HPI      Pulmonary    Oxygen at home:    Productive cough:     Wheezing:         Neurologic    Sudden weakness in arms or legs:     Sudden numbness in arms or legs:     Sudden onset of difficulty speaking or slurred speech:    Temporary loss of vision in one eye:     Problems with dizziness:         Gastrointestinal    Blood in stool:     Vomited blood:         Genitourinary    Burning when urinating:     Blood in urine:        Psychiatric    Major depression:         Hematologic    Bleeding problems:    Problems with blood clotting too easily:        Skin    Rashes or ulcers:        Constitutional    Fever or chills:      PHYSICAL EXAMINATION:  Today's Vitals   05/09/23 1338  05/09/23 1340  BP: (!) 174/74   Pulse: 79   Resp: (!) 22   Temp: 98.2 F (36.8 C)   TempSrc: Temporal   SpO2: 94%   Weight: 102 lb (46.3 kg)   Height: 5\' 4"  (1.626 m)   PainSc: 0-No pain 0-No pain   Body mass index is 17.51 kg/m.   General:  WDWN in NAD; vital signs documented above Gait: Not observed HENT: WNL, normocephalic Pulmonary: normal non-labored breathing , without wheezing Cardiac: regular HR, without carotid bruits Abdomen: soft, NT; aortic pulse is not  palpable Skin: without rashes Vascular Exam/Pulses:  Right Left  Radial 2+ (normal) 2+ (normal)  Femoral 2+ (normal) 2+ (normal)  DP Brisk monophasic Brisk monophasic  PT Brisk monophasic Brisk monophasic   Extremities: without ischemic changes, without Gangrene , without cellulitis; without open wounds Musculoskeletal: no muscle wasting or atrophy  Neurologic: A&O X 3 Psychiatric:  The pt has Normal affect.   Non-Invasive Vascular Imaging:   ABI's/TBI's on 05/01/2023: Right:  0.61/0.33 - Great toe pressure: 64 Left:  0.52/0.24 - Great toe pressure: 46   Previous ABI's/TBI's on 09/07/2021: Right:  0.43/0 - Great toe pressure: 0 Left:  0.50/0 - Great toe pressure:  0    ASSESSMENT/PLAN:: 89 y.o. female here for follow up for PAD with hx of  left CIA stent and aortic stent on 03/18/2016 by Dr. Darrick Penna.  She underwent aortogram with right CIA, left CIA, left EIA and left femoral endarterectomy with profundoplasty and bovine patch angioplasty 12/03/2020 by Dr. Lenell Antu. She underwent aortogram with angioplasty of bilateral CIA, left EIA stents on 04/30/2021 by Dr. Lenell Antu for rest pain.  PAD -pt ABI essentially unchanged from previous but toe pressures are better.  -she has been using a machine while sitting that keeps her legs moving.  She states this does help her legs feel better.  I encouraged her to continue this and stay as active as possible.   -discussed protecting her feet.  -return in 3 months with  aortoiliac duplex  -she does have some swelling in the dorsum of her feet with right worse than left.  Encouraged her to elevate her legs during the day since the swelling does improve when she is in bed overnight and contine to stay active.   Carotid stenosis -she is asymptomatic.   -in 2021, she had right ICA stenosis of 40-59%.  Will have her return in 3 months with carotid duplex.   Smoker -she quit smoking in January.  I praised her for this as she has been smoking since age 77.    -continue asa/Xaretlo/Statin    Doreatha Massed, [facility] [phone]  Clinic MD:   Chestine Spore

## 2023-05-09 NOTE — Telephone Encounter (Signed)
 Left voicemail for patient to return call to office.

## 2023-05-09 NOTE — Telephone Encounter (Signed)
 Pt called back returning AV's call. Call back # [phone]

## 2023-05-09 NOTE — Telephone Encounter (Signed)
 Reason for CRM: Patient returning a call from [place]. Called CAL and advised she will reqturn patients call. Patient states she will be gone to her appointment at 1pm and requesting a call back sometime after.

## 2023-05-11 ENCOUNTER — Other Ambulatory Visit: Payer: Self-pay | Admitting: *Deleted

## 2023-05-11 DIAGNOSIS — I7409 Other arterial embolism and thrombosis of abdominal aorta: Secondary | ICD-10-CM

## 2023-05-11 DIAGNOSIS — I6523 Occlusion and stenosis of bilateral carotid arteries: Secondary | ICD-10-CM

## 2023-05-11 DIAGNOSIS — I739 Peripheral vascular disease, unspecified: Secondary | ICD-10-CM

## 2023-05-11 NOTE — Telephone Encounter (Signed)
 Patient states that her leg is still giving her some trouble but they are not swelling its her feet. She is taking 2 lasix a day. She did see vascular on 05/09/23 and she is going back in July and they are going do all her test again.  Lab appt scheduled for 3/25

## 2023-05-12 NOTE — Telephone Encounter (Signed)
 Noted. Thanks.

## 2023-05-16 ENCOUNTER — Other Ambulatory Visit (INDEPENDENT_AMBULATORY_CARE_PROVIDER_SITE_OTHER)

## 2023-05-16 DIAGNOSIS — I739 Peripheral vascular disease, unspecified: Secondary | ICD-10-CM | POA: Diagnosis not present

## 2023-05-16 LAB — BASIC METABOLIC PANEL
BUN: 17 mg/dL (ref 6–23)
CO2: 31 meq/L (ref 19–32)
Calcium: 9.2 mg/dL (ref 8.4–10.5)
Chloride: 99 meq/L (ref 96–112)
Creatinine, Ser: 0.71 mg/dL (ref 0.40–1.20)
GFR: 76.68 mL/min (ref >60.00)
Glucose, Bld: 92 mg/dL (ref 70–99)
Potassium: 4 meq/L (ref 3.5–5.1)
Sodium: 138 meq/L (ref 135–145)

## 2023-05-23 ENCOUNTER — Other Ambulatory Visit: Payer: Self-pay | Admitting: Family Medicine

## 2023-05-23 DIAGNOSIS — R0600 Dyspnea, unspecified: Secondary | ICD-10-CM

## 2023-05-23 MED ORDER — FUROSEMIDE 20 MG PO TABS: 40.0000 mg | ORAL_TABLET | Freq: Every day | ORAL | Status: DC

## 2023-05-30 ENCOUNTER — Telehealth: Payer: Self-pay | Admitting: Family Medicine

## 2023-05-30 NOTE — Telephone Encounter (Signed)
 Copied from CRM [id number]. Topic: Clinical - Medical Advice >> May 30, 2023  2:19 PM Alcus Dad wrote: Reason for CRM: Patient feet has not gone down.. still swollen

## 2023-05-31 NOTE — Telephone Encounter (Signed)
 Pt scheduled to see Dr. Para March 06/01/23 at 10am.

## 2023-05-31 NOTE — Telephone Encounter (Signed)
 Please see about getting her rechecked in clinic so we can see about options.  Thanks.

## 2023-06-01 ENCOUNTER — Encounter: Payer: Self-pay | Admitting: Family Medicine

## 2023-06-01 ENCOUNTER — Ambulatory Visit (INDEPENDENT_AMBULATORY_CARE_PROVIDER_SITE_OTHER): Admitting: Family Medicine

## 2023-06-01 VITALS — BP 128/62 | HR 52 | Temp 97.7°F | Ht 64.0 in | Wt 105.2 lb

## 2023-06-01 DIAGNOSIS — R609 Edema, unspecified: Secondary | ICD-10-CM | POA: Diagnosis not present

## 2023-06-01 DIAGNOSIS — R0600 Dyspnea, unspecified: Secondary | ICD-10-CM

## 2023-06-01 DIAGNOSIS — Z23 Encounter for immunization: Secondary | ICD-10-CM

## 2023-06-01 MED ORDER — FUROSEMIDE 20 MG PO TABS: 40.0000 mg | ORAL_TABLET | Freq: Every day | ORAL | Status: DC

## 2023-06-01 NOTE — Progress Notes (Signed)
 Weight has been level.  Appetite is improving.  Still having BLE edema.  Taking 40mg  lasix a day.  Minimal change with 40mg  BID dosing, some better in the AM but then back to baseline with BLE edema. Generally her ankles are clearly better in the early AM, then puffy soon after standing.  Prev ABIs d/w pt.  She isn't smoking.  She got a passive motion machine for her legs. She is using 14mg  nicotine patch.  That helps.  I thanked her for her effort with smoking cessation.  Meds, vitals, and allergies reviewed.   ROS: Per HPI unless specifically indicated in ROS section   Nad Ncat Neck supple, no LA Rrr Ctab Abd soft, not ttp Dec B DP pulses.   B foot but not shin edema.

## 2023-06-01 NOTE — Patient Instructions (Signed)
 Let me check with vascular in the meantime.  Please try using the knee high compression stockings.  I wouldn't take extra fluid pills (furosemide) now.  Take care.  Glad to see you.

## 2023-06-04 NOTE — Assessment & Plan Note (Signed)
 I would continue 40 mg of Lasix in the meantime but not take an extra dose at this point.  I need input from vascular clinic to see if she would be a candidate for lymphedema pump.  She is going to try to continue using compression stockings in the meantime.

## 2023-06-06 ENCOUNTER — Ambulatory Visit: Payer: Self-pay

## 2023-06-06 ENCOUNTER — Other Ambulatory Visit: Payer: Self-pay

## 2023-06-06 NOTE — Patient Outreach (Signed)
 Complex Care Management   Visit Note  06/06/2023  Name:  Jill Franco MRN: [id number] DOB: May 24, 1934  Situation: Referral received for Complex Care Management related to COPD and bilateral LE swelling.  I obtained verbal consent from Patient.  Visit completed with patient.  on the phone  Background:   Past Medical History:  Diagnosis Date   Cataract    bilaterally corrected.   COPD (chronic obstructive pulmonary disease) (HCC)    CVA (cerebral vascular accident) (HCC)    Pt was unaware and does not know when it happened.  Was told after some testing was done.   Double vision    Dyspnea    Facial droop 10/2017   left   History of shingles 2012   HLD (hyperlipidemia)    HTN (hypertension)    Hypothyroidism    PVD (peripheral vascular disease) (HCC)    Vision loss    temporary    Assessment: Patient Reported Symptoms:  Cognitive Cognitive Status: Alert and oriented to person, place, and time, Insightful and able to interpret abstract concepts, Normal speech and language skills Cognitive/Intellectual Conditions Management [RPT]: None reported or documented in medical history or problem list   Health Maintenance Behaviors: Annual physical exam, Immunizations, Exercise Healing Pattern: Average Health Facilitated by: Rest  Neurological   Neurological Conditions:  (none)  HEENT HEENT Symptoms Reported: No symptoms reported      Cardiovascular Cardiovascular Symptoms Reported: Swelling in legs or feet Does patient have uncontrolled Hypertension?: No Cardiovascular Conditions: Hypertension (PAD) Cardiovascular Management Strategies: Routine screening, Medication therapy Weight: 104 lb (47.2 kg) Cardiovascular Self-Management Outcome: 4 (good)  Respiratory Respiratory Symptoms Reported: No symptoms reported Respiratory Conditions: COPD Respiratory Self-Management Outcome: 4 (good) Respiratory Comment: Patient states she is doing well with her breathing. States she  stopped smoking 03/18/23.  Reports taking her medications as prescribed.  Endocrine Patient reports the following symptoms related to hypoglycemia or hyperglycemia : No symptoms reported    Gastrointestinal Gastrointestinal Symptoms Reported: No symptoms reported      Genitourinary      Integumentary   Skin Management Strategies: Routine screening, Medication therapy (elevate legs when sitting and wear compression socks) Skin Self-Management Outcome: 3 (uncertain)  Musculoskeletal     Falls in the past year?: No Number of falls in past year: 1 or less Was there an injury with Fall?: No Fall Risk Category Calculator: 0 Patient Fall Risk Level: Low Fall Risk Patient at Risk for Falls Due to: No Fall Risks  Psychosocial Psychosocial Symptoms Reported: No symptoms reported     Quality of Family Relationships: supportive Do you feel physically threatened by others?: No      06/01/2023   10:04 AM  Depression screen PHQ 2/9  Decreased Interest 0  Down, Depressed, Hopeless 0  PHQ - 2 Score 0  Altered sleeping 0  Tired, decreased energy 0  Change in appetite 0  Feeling bad or failure about yourself  0  Trouble concentrating 0  Moving slowly or fidgety/restless 0  Suicidal thoughts 0  PHQ-9 Score 0  Difficult doing work/chores Not difficult at all    There were no vitals filed for this visit.  Medications Reviewed Today     Reviewed by Otho Ket, RN (Registered Nurse) on 06/06/23 at 1554  Med List Status: <None>   Medication Order Taking? Sig Documenting Provider Last Dose Status Informant  acetaminophen (TYLENOL) 650 MG CR tablet [id number] Yes Take 650 mg by mouth every 8 (eight) hours as needed for  pain. [provider] Taking Active Self, Pharmacy Records  albuterol (VENTOLIN HFA) 108 (90 Base) MCG/ACT inhaler [id number] Yes Inhale 2 puffs into the lungs every 6 (six) hours as needed for wheezing or shortness of breath. Donnie Galea, MD Taking Active Self,  Pharmacy Records  alendronate (FOSAMAX) 70 MG tablet [id number] Yes TAKE 1 TABLET BY MOUTH WEEKLY  WITH 8 OZ OF PLAIN WATER 30  MINUTES BEFORE FIRST FOOD, DRINK OR MEDS. STAY UPRIGHT FOR 30  MINS Donnie Galea, MD Taking Active Self, Pharmacy Records  aspirin EC 81 MG tablet [id number] Yes Take 1 tablet (81 mg total) by mouth daily. Swallow whole. Kern Pedro, PA-C Taking Active Self, Pharmacy Records  budesonide-formoterol [facility]) 160-4.5 MCG/ACT inhaler [id number] Yes Inhale 2 puffs into the lungs in the morning and at bedtime. Rinse after use. Donnie Galea, MD Taking Active Self, Pharmacy Records  Cholecalciferol (VITAMIN D3) 25 MCG (1000 UT) CAPS [id number] Yes Take 1 capsule (1,000 Units total) by mouth daily. Donnie Galea, MD Taking Active Self, Pharmacy Records  diclofenac Sodium (VOLTAREN) 1 % GEL [id number] Yes Apply 2 g topically 4 (four) times daily. Donnie Galea, MD Taking Active Self, Pharmacy Records           Med Note [facility] [place], [place] A   Fri Mar 17, 2023  6:35 PM) PRN  furosemide (LASIX) 20 MG tablet [id number] Yes Take 2 tablets (40 mg total) by mouth daily. Donnie Galea, MD Taking Active   ipratropium-albuterol (DUONEB) 0.5-2.5 (3) MG/3ML SOLN [id number] Yes Take 3 mLs by nebulization every 6 (six) hours as needed. Burton Casey, MD Taking Active   levothyroxine (SYNTHROID) 50 MCG tablet [id number] Yes TAKE 1 TABLET BY MOUTH DAILY  EXCEPT 2 TABLETS ON SUNDAYS AND  WEDNESDAYS ( TOTAL 9 TABS PER  WEEK) Donnie Galea, MD Taking Active   melatonin 5 MG TABS [id number] Yes Take 5 mg by mouth at bedtime as needed (For sleep). [provider] Taking Active Self, Pharmacy Records  methocarbamol (ROBAXIN) 500 MG tablet [id number] Yes Take 1 tablet (500 mg total) by mouth 2 (two) times daily as needed for muscle spasms. Donnie Galea, MD Taking Active Self, Pharmacy Records  nicotine (NICODERM CQ - DOSED IN MG/24 HOURS) 14 mg/24hr patch [id number]  Yes Place 14 mg onto the skin daily. [provider] Taking Active   rosuvastatin (CRESTOR) 20 MG tablet [id number] Yes TAKE 1 TABLET BY MOUTH DAILY Donnie Galea, MD Taking Active Self, Pharmacy Records  tiotropium [facility] HANDIHALER) 18 MCG inhalation capsule [id number] Yes Place 1 capsule (18 mcg total) into inhaler and inhale daily. Donnie Galea, MD Taking Active   XARELTO 2.5 MG TABS tablet [id number] Yes TAKE 1 TABLET BY MOUTH TWICE  DAILY Donnie Galea, MD Taking Active Self, Pharmacy Records            Recommendation:   PCP Follow-up  Follow Up Plan:   Telephone follow-up in 1 month with RN case manager.   Verba Girt RN, BSN, CCM [employer], Population Health Case Manager Phone: [phone]

## 2023-06-06 NOTE — Patient Instructions (Signed)
 Visit Information  Thank you for taking time to visit with me today. Please don't hesitate to contact me if I can be of assistance to you before our next scheduled appointment.  Our next appointment is by telephone on 07/04/23 at 2 pm Please call the care guide team at [phone] if you need to cancel or reschedule your appointment.   Following is a copy of your care plan:   Goals Addressed             This Visit's Progress    COMPLETED: Patient stated goal: "I'd like to focus on my COPD and health conditions"       Interventions Today    Flowsheet Row Most Recent Value  Chronic Disease   Chronic disease during today's visit Chronic Obstructive Pulmonary Disease (COPD), Other  [red/painful spots on front of lower legs and feet.]  General Interventions   General Interventions Discussed/Reviewed General Interventions Reviewed, Doctor Visits, Communication with  [evaluation of current treatment plan for listed health conditions and patients adherence to plan as established by provider. Assessed for increase in COPD symptoms. Assessed new symptoms of red spots on legs/ feet / pain]  Doctor Visits Discussed/Reviewed Doctor Visits Reviewed  [confirmed patient had post hospital follow up visit with primary care provider.  Reviewed primary provider post hospital follow up visit 04/24/23. Reviewed upcoming provider visits and advised to keep follow up visits as recommended.]  Communication with PCP/Specialists  [Scheduled patient to see primary provider on 04/27/23 for painful red areas on lower extremities and feet.]  Education Interventions   Education Provided Provided Education  [Confirmed patient compliant with primary provider treatment plan as discussed on 04/24/23. Reviewed COPD symptoms. Advised to notify provider for increase symptoms or call 911 for severe symptoms.]  Provided Verbal Education On Other  [Discussed patients adherence to smoking cessation and confirmed patient wearing smoking  cessation patch as recommended by provider. Reviewed infection symptoms. Advised to monitor legs/ feet for infection symptoms. Advised to call provider with symptoms.]  Pharmacy Interventions   Pharmacy Dicussed/Reviewed Pharmacy Topics Reviewed  [confirmed patient has all medications including newly prescribed and takes them as prescribed. Compliance discussed and advised. Confirmed patient able to afford medications at this time. Confirmed patient has nebulizer.]           VBCI RN Care Plan- bilateral LE swelling/       Problems:  Chronic Disease Management support and education needs related to bilateral leg swelling with red spots on feet.   Goal: Over the next 2 months the Patient will attend all scheduled medical appointments: with providers  as evidenced by patient report / chart review.         continue to work with Medical illustrator and/or Social Worker to address care management and care coordination needs related to bilateral leg swelling/ red spots to both feet as evidenced by adherence to CM Team Scheduled appointments     demonstrate Ongoing adherence to prescribed treatment plan for bilateral leg swelling / red spots to feet as evidenced by patient report/ chart review.  take all medications exactly as prescribed and will call provider for medication related questions as evidenced by patient report/ chart review.      Interventions:   Evaluation of current treatment plan related to  for listed health condition and patients adherence to plan as established by provider.  ,   Discussed plans with patient for ongoing care management follow up and provided patient with direct contact information for  care management team Provided education to patient re: elevating legs when sitting/laying down, wearing compression hose as recommended by provider, and taking prescribed medications.  Reviewed medications with patient and discussed compliance. Reviewed scheduled/upcoming provider  appointments including   Discussed plans with patient for ongoing care management follow up and provided patient with direct contact information for care management team Advised to reduce salt intake.  Patient Self-Care Activities:  Attend all scheduled provider appointments Call pharmacy for medication refills 3-7 days in advance of running out of medications Call provider office for new concerns or questions  Take medications as prescribed   Elevate legs when sitting/ standing Wear compression hose as recommended by provider Follow a low salt diet.    Plan:  Telephone follow up appointment with care management team member scheduled for:  07/04/23 at 2 pm           VBCI RN Care Plan- COPD       Problems:  Chronic Disease Management support and education needs related to COPD  Goal: Over the next 2 months the Patient will attend all scheduled medical appointments: with providers as evidenced by patient report/ chart review.         continue to work with Medical illustrator and/or Social Worker to address care management and care coordination needs related to COPD as evidenced by adherence to CM Team Scheduled appointments     demonstrate Ongoing adherence to prescribed treatment plan for COPD as evidenced by patient report/ chart review.  take all medications exactly as prescribed and will call provider for medication related questions as evidenced by patient report      Interventions:   COPD Interventions: Advised patient to track and manage COPD triggers Advised patient to self assesses COPD action plan zone and make appointment with provider if in the yellow zone for 48 hours without improvement Advised to notify provider for any increase in COPD symptoms. Advised to call 911 for severe symptoms.    Patient Self-Care Activities:  Attend all scheduled provider appointments Call pharmacy for medication refills 3-7 days in advance of running out of medications Call provider  office for new concerns or questions  Take medications as prescribed   eliminate symptom triggers at home follow rescue plan if symptoms flare-up keep follow-up appointments: with providers as recommended.   Plan:  Telephone follow up appointment with care management team member scheduled for:  07/04/23 at 2 pm             Please call the Suicide and Crisis Lifeline: 988 call 1-800-273-TALK (toll free, 24 hour hotline) if you are experiencing a Mental Health or Behavioral Health Crisis or need someone to talk to.  Patient verbalizes understanding of instructions and care plan provided today and agrees to view in MyChart. Active MyChart status and patient understanding of how to access instructions and care plan via MyChart confirmed with patient.     Verba Girt RN, BSN, CCM [employer], Population Health Case Manager Phone: [phone]

## 2023-06-08 ENCOUNTER — Other Ambulatory Visit: Payer: Self-pay | Admitting: Family Medicine

## 2023-06-08 DIAGNOSIS — I739 Peripheral vascular disease, unspecified: Secondary | ICD-10-CM

## 2023-06-25 ENCOUNTER — Other Ambulatory Visit: Payer: Self-pay | Admitting: Family Medicine

## 2023-07-04 ENCOUNTER — Other Ambulatory Visit: Payer: Self-pay

## 2023-07-04 ENCOUNTER — Telehealth

## 2023-07-04 NOTE — Patient Instructions (Signed)
 Visit Information  Thank you for taking time to visit with me today. Your care management goals have been met.   Please contact your primary care provider if you have questions or concerns.    Please call the Suicide and Crisis Lifeline: 988 call 1-800-273-TALK (toll free, 24 hour hotline) if you are experiencing a Mental Health or Behavioral Health Crisis or need someone to talk to.  Verba Girt RN, BSN, CCM [employer], Population Health Case Manager Phone: [phone]

## 2023-07-04 NOTE — Patient Outreach (Signed)
 Complex Care Management   Visit Note  07/04/2023  Name:  Jill Franco MRN: [id number] DOB: 12-24-1934  Situation: Referral received for Complex Care Management related to COPD I obtained verbal consent from Patient.  Visit completed with patient  on the phone  Background:   Past Medical History:  Diagnosis Date   Cataract    bilaterally corrected.   COPD (chronic obstructive pulmonary disease) (HCC)    CVA (cerebral vascular accident) (HCC)    Pt was unaware and does not know when it happened.  Was told after some testing was done.   Double vision    Dyspnea    Facial droop 10/2017   left   History of shingles 2012   HLD (hyperlipidemia)    HTN (hypertension)    Hypothyroidism    PVD (peripheral vascular disease) (HCC)    Vision loss    temporary    Assessment: Patient Reported Symptoms:  Cognitive Cognitive Status: Alert and oriented to person, place, and time, Insightful and able to interpret abstract concepts, Normal speech and language skills Cognitive/Intellectual Conditions Management [RPT]: None reported or documented in medical history or problem list   Health Maintenance Behaviors: Annual physical exam, Immunizations  Neurological Neurological Review of Symptoms: No symptoms reported    HEENT HEENT Symptoms Reported: No symptoms reported      Cardiovascular Cardiovascular Symptoms Reported: Swelling in legs or feet Does patient have uncontrolled Hypertension?: No Cardiovascular Conditions: Hypertension (edema) Cardiovascular Management Strategies: Routine screening, Medication therapy Cardiovascular Comment: Patient states she continues to have ongoing BLE swelling. She states she is scheduled to see the vascular doctor on 08/22/23.  Patient states she continues to wear her compression hose, elevate legs when sitting, takes fluid pill as prescribed, and uses passive motion machine for her legs.  Respiratory Respiratory Symptoms Reported: No symptoms  reported Respiratory Conditions: COPD Respiratory Comment: Patient reports doing well. Denies any increase in symptoms related to her COPD.  Reports ongoing management with routine provider visit and medication.  Endocrine Patient reports the following symptoms related to hypoglycemia or hyperglycemia : No symptoms reported    Gastrointestinal Gastrointestinal Symptoms Reported: No symptoms reported      Genitourinary Genitourinary Symptoms Reported: No symptoms reported    Integumentary Integumentary Symptoms Reported: Other Additional Integumentary Details: patient states she continues to have red spots on right foot. Denies any open sores. She states she continues to apply topical antibiotic ointment to areas and wears compression hose.  Patient states she is scheduled to see the vascular doctor on 08/22/23. Skin Management Strategies: Routine screening  Musculoskeletal Musculoskelatal Symptoms Reviewed: Not assessed        Psychosocial Psychosocial Symptoms Reported: Not assessed           There were no vitals filed for this visit.  Medications Reviewed Today     Reviewed by Tilley Faeth E, RN (Registered Nurse) on 07/04/23 at 1438  Med List Status: <None>   Medication Order Taking? Sig Documenting Provider Last Dose Status Informant  acetaminophen  (TYLENOL ) 650 MG CR tablet [id number] No Take 650 mg by mouth every 8 (eight) hours as needed for pain. [provider] Taking Active Self, Pharmacy Records  albuterol  (VENTOLIN  HFA) 108 [phone] Base) MCG/ACT inhaler [id number] No Inhale 2 puffs into the lungs every 6 (six) hours as needed for wheezing or shortness of breath. Donnie Galea, MD Taking Active Self, Pharmacy Records  alendronate  (FOSAMAX ) 70 MG tablet [id number] No TAKE 1 TABLET BY MOUTH WEEKLY  WITH  8 OZ OF PLAIN WATER 30  MINUTES BEFORE FIRST FOOD, DRINK OR MEDS. STAY UPRIGHT FOR 30  MINS Donnie Galea, MD Taking Active Self, Pharmacy Records  aspirin  EC 81 MG  tablet [id number] No Take 1 tablet (81 mg total) by mouth daily. Swallow whole. Cantwell, Celeste C, PA-C Taking Active Self, Pharmacy Records  budesonide -formoterol  (SYMBICORT ) 160-4.5 MCG/ACT inhaler [id number] No Inhale 2 puffs into the lungs in the morning and at bedtime. Rinse after use. Donnie Galea, MD Taking Active Self, Pharmacy Records  Cholecalciferol  (VITAMIN D3) 25 MCG (1000 UT) CAPS [id number] No Take 1 capsule (1,000 Units total) by mouth daily. Donnie Galea, MD Taking Active Self, Pharmacy Records  diclofenac  Sodium (VOLTAREN ) 1 % GEL [id number] No Apply 2 g topically 4 (four) times daily. Donnie Galea, MD Taking Active Self, Pharmacy Records           Med Note [facility] [place], [place] A   Fri Mar 17, 2023  6:35 PM) PRN  furosemide  (LASIX ) 20 MG tablet [id number] No Take 2 tablets (40 mg total) by mouth daily. Donnie Galea, MD Taking Active   ipratropium-albuterol  (DUONEB) 0.5-2.5 (3) MG/3ML SOLN [id number] No Take 3 mLs by nebulization every 6 (six) hours as needed. Burton Casey, MD Taking Active   levothyroxine  (SYNTHROID ) 50 MCG tablet [id number] No TAKE 1 TABLET BY MOUTH DAILY  EXCEPT 2 TABLETS ON SUNDAYS AND  WEDNESDAYS ( TOTAL 9 TABS PER  WEEK) Donnie Galea, MD Taking Active   melatonin 5 MG TABS [id number] No Take 5 mg by mouth at bedtime as needed (For sleep). [provider] Taking Active Self, Pharmacy Records  methocarbamol  (ROBAXIN ) 500 MG tablet [id number] No Take 1 tablet (500 mg total) by mouth 2 (two) times daily as needed for muscle spasms. Donnie Galea, MD Taking Active Self, Pharmacy Records  nicotine  (NICODERM CQ  - DOSED IN MG/24 HOURS) 14 mg/24hr patch [id number] No Place 14 mg onto the skin daily. [provider] Taking Active   rivaroxaban  (XARELTO ) 2.5 MG TABS tablet [id number]  TAKE 1 TABLET BY MOUTH TWICE  DAILY Donnie Galea, MD  Active   rosuvastatin  (CRESTOR ) 20 MG tablet [id number] No TAKE 1 TABLET BY MOUTH DAILY  Donnie Galea, MD Taking Active Self, Pharmacy Records  SPIRIVA  HANDIHALER 18 MCG inhalation capsule [id number]  INHALE THE CONTENTS OF 1 CAPSULE BY MOUTH VIA INHALATION DEVICE  DAILY Donnie Galea, MD  Active             Recommendation:   PCP Follow-up  Follow Up Plan:   Patient has met all care management goals. Care Management case will be closed. Patient has been provided contact information should new needs arise.   Verba Girt RN, BSN, CCM [employer], Population Health Case Manager Phone: [phone]

## 2023-07-06 ENCOUNTER — Other Ambulatory Visit: Payer: Self-pay

## 2023-07-06 NOTE — Patient Outreach (Signed)
 Complex Care Management   Visit Note  07/06/2023  Name:  ANITA BALBO MRN: [id number] DOB: 26-Feb-1934  Situation: Patient requested RN case manager to read over 2 letters she received in the mail from Medicaid. Patient states she doesn't have Medicaid and is very confused by the letters she received. I obtained verbal consent from Patient.  Visit completed with patient  in the office. Reviewed letters as requested and collaborate with Dallis Dues, social worker.  Informed that patient has Medicaid that is paying a portion of her Medicare premium and due to this Medicaid will send out information periodically.  Patient reassured she can disregard letters received since they do not apply to her at this time. Patient verbalized understanding and appreciation of the assistance. Patient reported that her fee were swollen.  She states this is chronic for her and states that the swelling goes down after sleeping at night. Advised patient to follow  recommendations from vascular doctor by elevating legs when sitting/ lying down and continue to stay active utilizing leg exercise machine.   Background:   Past Medical History:  Diagnosis Date   Cataract    bilaterally corrected.   COPD (chronic obstructive pulmonary disease) (HCC)    CVA (cerebral vascular accident) (HCC)    Pt was unaware and does not know when it happened.  Was told after some testing was done.   Double vision    Dyspnea    Facial droop 10/2017   left   History of shingles 2012   HLD (hyperlipidemia)    HTN (hypertension)    Hypothyroidism    PVD (peripheral vascular disease) (HCC)    Vision loss    temporary    Recommendation:   PCP Follow-up  Follow Up Plan:   Patient has met all care management goals. Care Management case will be closed. Patient has been provided contact information should new needs arise. And has been to advised to contact her primary care provider for any needs or concerns.   Verba Girt RN,  BSN, CCM [employer], Population Health Case Manager Phone: [phone]

## 2023-07-13 ENCOUNTER — Other Ambulatory Visit: Payer: Self-pay | Admitting: Family Medicine

## 2023-07-19 ENCOUNTER — Ambulatory Visit (INDEPENDENT_AMBULATORY_CARE_PROVIDER_SITE_OTHER)

## 2023-07-19 VITALS — Ht 64.0 in | Wt 104.0 lb

## 2023-07-19 DIAGNOSIS — Z0000 Encounter for general adult medical examination without abnormal findings: Secondary | ICD-10-CM

## 2023-07-19 NOTE — Patient Instructions (Signed)
 Ms. Stanko , Thank you for taking time out of your busy schedule to complete your Annual Wellness Visit with me. I enjoyed our conversation and look forward to speaking with you again next year. I, as well as your care team,  appreciate your ongoing commitment to your health goals. Please review the following plan we discussed and let me know if I can assist you in the future. Your Game plan/ To Do List     Follow up Visits: Next Medicare AWV with our clinical staff: 07/19/2024 @ 8:10am televisit   Have you seen your provider in the last 6 months (3 months if uncontrolled diabetes)? Yes Next Office Visit with your provider: 08/31/23  Clinician Recommendations:  Aim for 30 minutes of exercise or brisk walking, 6-8 glasses of water, and 5 servings of fruits and vegetables each day.       This is a list of the screening recommended for you and due dates:  Health Maintenance  Topic Date Due   DTaP/Tdap/Td vaccine (3 - Td or Tdap) 05/30/2020   Flu Shot  09/22/2023   Medicare Annual Wellness Visit  07/18/2024   Pneumonia Vaccine  Completed   DEXA scan (bone density measurement)  Completed   Zoster (Shingles) Vaccine  Completed   HPV Vaccine  Aged Out   Meningitis B Vaccine  Aged Out   COVID-19 Vaccine  Discontinued    Advanced directives: (Copy Requested) Please bring a copy of your health care power of attorney and living will to the office to be added to your chart at your convenience. You can mail to [facility] [address] [place], [place] [address] or email to [email or web address] Advance Care Planning is important because it:  [x]  Makes sure you receive the medical care that is consistent with your values, goals, and preferences  [x]  It provides guidance to your family and loved ones and reduces their decisional burden about whether or not they are making the right decisions based on your wishes.  Follow the link provided in your after visit summary or  read over the paperwork we have mailed to you to help you started getting your Advance Directives in place. If you need assistance in completing these, please reach out to us  so that we can help you!

## 2023-07-19 NOTE — Progress Notes (Signed)
 Please attest and cosign this visit due to patients primary care provider not being in the office at the time the visit was completed.    Subjective:   Jill Franco is a 89 y.o. who presents for a Medicare Wellness preventive visit.  As a reminder, Annual Wellness Visits don't include a physical exam, and some assessments may be limited, especially if this visit is performed virtually. We may recommend an in-person follow-up visit with your provider if needed.  Visit Complete: Virtual I connected with  Jill Franco on 07/19/23 by a audio enabled telemedicine application and verified that I am speaking with the correct person using two identifiers.  Patient Location: Home  Provider Location: Home Office  I discussed the limitations of evaluation and management by telemedicine. The patient expressed understanding and agreed to proceed.  Vital Signs: Because this visit was a virtual/telehealth visit, some criteria may be missing or patient reported. Any vitals not documented were not able to be obtained and vitals that have been documented are patient reported.  VideoDeclined- This patient declined Librarian, academic. Therefore the visit was completed with audio only.  Persons Participating in Visit: Patient.  AWV Questionnaire: No: Patient Medicare AWV questionnaire was not completed prior to this visit.  Cardiac Risk Factors include: advanced age (>46men, >41 women);hypertension     Objective:     Today's Vitals   07/19/23 0819  Weight: 104 lb (47.2 kg)  Height: 5\' 4"  (1.626 m)   Body mass index is 17.85 kg/m.     07/19/2023    8:31 AM 06/06/2023    2:45 PM 03/18/2023    5:13 PM 08/24/2022    2:20 PM 11/01/2021    7:50 PM 09/28/2021    9:37 AM 04/30/2021    7:32 AM  Advanced Directives  Does Patient Have a Medical Advance Directive? Yes No Yes Yes No No Yes  Type of Estate agent of [place];Living will  Living will  Healthcare Power of [place];Living will   Healthcare Power of Attorney  Does patient want to make changes to medical advance directive?   No - Patient declined      Copy of Healthcare Power of Attorney in Chart? No - copy requested   No - copy requested     Would patient like information on creating a medical advance directive?  Yes (MAU/Ambulatory/Procedural Areas - Information given)   No - Patient declined No - Patient declined     Current Medications (verified) Outpatient Encounter Medications as of 07/19/2023  Medication Sig   acetaminophen  (TYLENOL ) 650 MG CR tablet Take 650 mg by mouth every 8 (eight) hours as needed for pain.   albuterol  (VENTOLIN  HFA) 108 (90 Base) MCG/ACT inhaler USE 2 INHALATIONS BY MOUTH EVERY 6 HOURS AS NEEDED FOR WHEEZING  OR SHORTNESS OF BREATH   alendronate  (FOSAMAX ) 70 MG tablet TAKE 1 TABLET BY MOUTH WEEKLY  WITH 8 OZ OF PLAIN WATER 30  MINUTES BEFORE FIRST FOOD, DRINK OR MEDS. STAY UPRIGHT FOR 30  MINS   aspirin  EC 81 MG tablet Take 1 tablet (81 mg total) by mouth daily. Swallow whole.   budesonide -formoterol  (SYMBICORT ) 160-4.5 MCG/ACT inhaler Inhale 2 puffs into the lungs in the morning and at bedtime. Rinse after use.   Cholecalciferol  (VITAMIN D3) 25 MCG (1000 UT) CAPS Take 1 capsule (1,000 Units total) by mouth daily.   diclofenac  Sodium (VOLTAREN ) 1 % GEL Apply 2 g topically 4 (four) times daily.  furosemide  (LASIX ) 20 MG tablet Take 2 tablets (40 mg total) by mouth daily.   ipratropium-albuterol  (DUONEB) 0.5-2.5 (3) MG/3ML SOLN Take 3 mLs by nebulization every 6 (six) hours as needed.   levothyroxine  (SYNTHROID ) 50 MCG tablet TAKE 1 TABLET BY MOUTH DAILY  EXCEPT 2 TABLETS ON SUNDAYS AND  WEDNESDAYS ( TOTAL 9 TABS PER  WEEK)   melatonin 5 MG TABS Take 5 mg by mouth at bedtime as needed (For sleep).   methocarbamol  (ROBAXIN ) 500 MG tablet Take 1 tablet (500 mg total) by mouth 2 (two) times daily as needed for muscle spasms.   nicotine  (NICODERM CQ  -  DOSED IN MG/24 HOURS) 14 mg/24hr patch Place 14 mg onto the skin daily.   rivaroxaban  (XARELTO ) 2.5 MG TABS tablet TAKE 1 TABLET BY MOUTH TWICE  DAILY   rosuvastatin  (CRESTOR ) 20 MG tablet TAKE 1 TABLET BY MOUTH DAILY   SPIRIVA  HANDIHALER 18 MCG inhalation capsule INHALE THE CONTENTS OF 1 CAPSULE BY MOUTH VIA INHALATION DEVICE  DAILY   No facility-administered encounter medications on file as of 07/19/2023.    Allergies (verified) Lipitor [atorvastatin ], Advil [ibuprofen], Aspirin , Codeine, and Other   History: Past Medical History:  Diagnosis Date   Cataract    bilaterally corrected.   COPD (chronic obstructive pulmonary disease) (HCC)    CVA (cerebral vascular accident) (HCC)    Pt was unaware and does not know when it happened.  Was told after some testing was done.   Double vision    Dyspnea    Facial droop 10/2017   left   History of shingles 2012   HLD (hyperlipidemia)    HTN (hypertension)    Hypothyroidism    PVD (peripheral vascular disease) (HCC)    Vision loss    temporary   Past Surgical History:  Procedure Laterality Date   ABDOMINAL AORTOGRAM N/A 04/30/2021   Procedure: ABDOMINAL AORTOGRAM;  Surgeon: Carlene Che, MD;  Location: [facility];  Service: Cardiovascular;  Laterality: N/A;   ABDOMINAL EXPLORATION SURGERY  1950s   "they thought I was pregnant; went in to explore"   APPENDECTOMY  childhood   BACK SURGERY     BREAST LUMPECTOMY Left 1960's   benign   carotid ultrasound  06/19/2007   0-39% stenosis   CATARACT EXTRACTION W/ INTRAOCULAR LENS IMPLANT Left 09/2016   CATARACT EXTRACTION W/ INTRAOCULAR LENS IMPLANT Right 10/2016   COLONOSCOPY WITH PROPOFOL  N/A 04/08/2019   Procedure: COLONOSCOPY WITH PROPOFOL ;  Surgeon: Janel Medford, MD;  Location: [facility];  Service: Endoscopy;  Laterality: N/A;   DILATION AND CURETTAGE OF UTERUS  1960's X 3   miscarriages   DOPPLER ECHOCARDIOGRAPHY  06/19/2007   Normal EF 55-70%   ENDARTERECTOMY  FEMORAL Left 12/03/2020   Procedure: LEFT FEMORAL ENDARTERECTOMY AND PROFUNDAPLASTY;  Surgeon: Carlene Che, MD;  Location: [facility];  Service: Vascular;  Laterality: Left;   ILIAC ARTERY STENT Left 03/18/2016   common iliac stent (6 x 40), aortic stent (10 x 19)/notes 03/18/2016   INSERTION OF ILIAC STENT Left 12/03/2020   Procedure: AORTIC STENTING, INSERTION OF BILATERAL COMMON ILIAC STENTS, AND LEFT EXTERNAL ILIAC STENT;  Surgeon: Carlene Che, MD;  Location: [facility];  Service: Vascular;  Laterality: Left;   LAMINECTOMY AND MICRODISCECTOMY SPINE  1960's X 2   LOWER EXTREMITY ANGIOGRAM N/A 12/03/2020   Procedure: [place] Curb;  Surgeon: Carlene Che, MD;  Location: [facility];  Service: Vascular;  Laterality: N/A;   MOLE REMOVAL  07/10/2016   NSVD     x1   PATCH ANGIOPLASTY Left 12/03/2020   Procedure: PATCH ANGIOPLASTY OF LEFT COMMON FEMORAL ARTERY USING BOVINE PERICARDIUM PATCH;  Surgeon: Carlene Che, MD;  Location: [facility];  Service: Vascular;  Laterality: Left;   PERIPHERAL VASCULAR BALLOON ANGIOPLASTY Right 04/30/2021   Procedure: PERIPHERAL VASCULAR BALLOON ANGIOPLASTY;  Surgeon: Carlene Che, MD;  [facility]: [facility];  Service: Cardiovascular;  Laterality: Right;  rt common iliac   PERIPHERAL VASCULAR CATHETERIZATION N/A 03/18/2016   Procedure: Abdominal Aortogram w/Lower Extremity;  Surgeon: Richrd Char, MD;  [facility]: [facility] INV[facility] CV [facility];  Service: Cardiovascular;  Laterality: N/A;   PERIPHERAL VASCULAR CATHETERIZATION  03/18/2016   Procedure: Peripheral Vascular Intervention;  Surgeon: Richrd Char, MD;  Location: [facility]AS[facility] CV [facility];  Service: Cardiovascular;;   PERIPHERAL VASCULAR INTERVENTION Left 04/30/2021   Procedure: PERIPHERAL VASCULAR INTERVENTION;  Surgeon: Carlene Che, MD;  [facility];  Service: Cardiovascular;  Laterality: Left;  lt common iliac   POLYPECTOMY  04/08/2019   Procedure: POLYPECTOMY;  Surgeon: Janel Medford, MD;  Location: [facility];  Service: Endoscopy;;   ULTRASOUND GUIDANCE FOR VASCULAR ACCESS Right 12/03/2020   Procedure: ULTRASOUND GUIDANCE FOR VASCULAR ACCESS, RIGHT FEMORAL ARTERY;  Surgeon: Carlene Che, MD;  Location: [facility];  Service: Vascular;  Laterality: Right;   Family History  Problem Relation Age of Onset   Stroke Father 57   Cancer Brother    Colon cancer Neg Hx    Breast cancer Neg Hx    Colon polyps Neg Hx    Esophageal cancer Neg Hx    Rectal cancer Neg Hx    Stomach cancer Neg Hx    Social History   Socioeconomic History   Marital status: Widowed    Spouse name: Not on file   Number of children: 1   Years of education: Not on file   Highest education level: Not on file  Occupational History   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  Tobacco Use   Smoking status: Former    Current packs/day: 0.00    Average packs/day: 0.3 packs/day for 69.0 years (17.3 ttl pk-yrs)    Types: Cigarettes    Start date: 01/1952    Quit date: 01/2021    Years since quitting: 2.4    Passive exposure: Current   Smokeless tobacco: Never  Vaping Use   Vaping status: Never Used  Substance and Sexual Activity   Alcohol use: No    Alcohol/week: 0.0 standard drinks of alcohol   Drug use: No   Sexual activity: Not on file  Other Topics Concern   Not on file  Social History Narrative   From [place]   Retired 2007   Widowed 2008 after 49 years   Enjoys yard work, but needs some help with yard work now.     Her daughter is helping at home some (some as of 2022).  Her daughter has vision loss and is widowed as of 2022 and moved in with patient.   Social Drivers of Corporate investment banker Strain: Low Risk  (07/19/2023)   Overall Financial Resource Strain (CARDIA)    Difficulty of Paying Living Expenses: Not hard at all  Food Insecurity: No Food Insecurity (07/19/2023)   Hunger Vital Sign    Worried About Running Out of Food in the Last  Year: Never true    Ran Out of Food in  the Last Year: Never true  Transportation Needs: No Transportation Needs (07/19/2023)   PRAPARE - Administrator, Civil Service (Medical): No    Lack of Transportation (Non-Medical): No  Physical Activity: Insufficiently Active (07/19/2023)   Exercise Vital Sign    Days of Exercise per Week: 3 days    Minutes of Exercise per Session: 30 min  Stress: No Stress Concern Present (07/19/2023)   [employer] of Occupational Health - Occupational Stress Questionnaire    Feeling of Stress : Not at all  Social Connections: Socially Isolated (07/19/2023)   Social Connection and Isolation Panel [NHANES]    Frequency of Communication with Friends and Family: More than three times a week    Frequency of Social Gatherings with Friends and Family: More than three times a week    Attends Religious Services: Never    Database administrator or Organizations: No    Attends Banker Meetings: Never    Marital Status: Widowed    Tobacco Counseling Counseling given: Not Answered    Clinical Intake:  Pre-visit preparation completed: Yes  Pain : No/denies pain     BMI - recorded: 17.85 Nutritional Status: BMI of 19-24  Normal Nutritional Risks: None Diabetes: No  Lab Results  Component Value Date   HGBA1C 5.7 09/18/2020     How often do you need to have someone help you when you read instructions, pamphlets, or other written materials from your doctor or pharmacy?: 1 - Never  Interpreter Needed?: No  Comments: lives with daughter Information entered by :: B.Miranda Frese,LPN   Activities of Daily Living     07/19/2023    8:31 AM 03/18/2023    5:16 PM  In your present state of health, do you have any difficulty performing the following activities:  Hearing? 1   Vision? 1   Difficulty concentrating or making decisions? 0   Walking or climbing stairs? 1   Dressing or bathing? 0   Doing errands, shopping? 0 0  Preparing  Food and eating ? N   Using the Toilet? N   In the past six months, have you accidently leaked urine? N   Do you have problems with loss of bowel control? N   Managing your Medications? N   Managing your Finances? N   Housekeeping or managing your Housekeeping? N     Patient Care Team: Donnie Galea, MD as PCP - General (Family Medicine) Princella Brooklyn, OD as Referring Physician (Optometry)  Indicate any recent Medical Services you may have received from other than [facility] providers in the past year (date may be approximate).     Assessment:    This is a routine wellness examination for Noel.  Hearing/Vision screen Hearing Screening - Comments:: Pt says her hearing is &quot;a little problem&quot; but not need test or anything right now Vision Screening - Comments:: Pt states her vision is generally ok but has appt in am for blurry Dr Demetrios Finders   Goals Addressed             This Visit's Progress    DIET - EAT MORE FRUITS AND VEGETABLES   On track    07/19/23       Depression Screen     07/19/2023    8:28 AM 06/01/2023   10:04 AM 12/26/2022    8:50 AM 11/01/2022    9:04 AM 09/27/2022   10:41 AM 08/24/2022    2:19 PM 05/23/2022    9:33 AM  PHQ 2/9 Scores  PHQ - 2 Score 0 0 0 0 0 0 0  PHQ- 9 Score  0 0 0 0  1    Fall Risk     07/19/2023    8:25 AM 06/06/2023    2:44 PM 06/01/2023   10:03 AM 12/26/2022    8:50 AM 11/01/2022    9:04 AM  Fall Risk   Falls in the past year? 0 0 0 0 0  Number falls in past yr: 0 0 0 0 0  Injury with Fall? 0 0 0 0 0  Risk for fall due to : No Fall Risks No Fall Risks No Fall Risks No Fall Risks No Fall Risks  Follow up Education provided;Falls prevention discussed  Falls evaluation completed Falls evaluation completed Falls evaluation completed    MEDICARE RISK AT HOME:  Medicare Risk at Home Any stairs in or around the home?: No (ramp at back of home she uses) If so, are there any without handrails?: No Home free of loose throw rugs in  walkways, pet beds, electrical cords, etc?: Yes Adequate lighting in your home to reduce risk of falls?: Yes Life alert?: No Use of a cane, walker or w/c?: Yes Grab bars in the bathroom?: Yes Shower chair or bench in shower?: Yes Elevated toilet seat or a handicapped toilet?: Yes  TIMED UP AND GO:  Was the test performed?  No  Cognitive Function: 6CIT completed    08/17/2017    8:55 AM 08/04/2016    1:40 PM 07/27/2015    1:25 PM  MMSE - Mini Mental State Exam  Orientation to time 5 5 5   Orientation to Place 5 5 5   Registration 3 3 3   Attention/ Calculation 0 0 0  Recall 3 3 3   Language- name 2 objects 0 0 0  Language- repeat 1 1 1   Language- follow 3 step command 3 3 3   Language- read & follow direction 0 0 0  Write a sentence 0 0 0  Copy design 0 0 0  Total score 20 20 20         07/19/2023    8:33 AM 08/24/2022    2:20 PM  6CIT Screen  What Year? 0 points 0 points  What month? 0 points 0 points  What time? 0 points 0 points  Count back from 20 0 points 0 points  Months in reverse 0 points 0 points  Repeat phrase 4 points 0 points  Total Score 4 points 0 points    Immunizations Immunization History  Administered Date(s) Administered   Fluad Quad(high Dose 65+) 11/23/2018, 11/02/2019, 10/22/2020, 12/01/2021   Fluad Trivalent(High Dose 65+) 12/22/2022   Influenza Split 12/06/2010   Influenza Whole 11/21/2004, 11/30/2006, 12/02/2008, 11/27/2009   Influenza,inj,Quad PF,6+ Mos 12/11/2012, 11/12/2013, 01/29/2015, 11/27/2015, 12/22/2016, 11/21/2017   PFIZER(Purple Top)SARS-COV-2 Vaccination 04/21/2019, 05/21/2019, 02/07/2020   Pneumococcal Conjugate-13 08/04/2014   Pneumococcal Polysaccharide-23 05/23/2006   Td 05/22/2004   Tdap 05/31/2010   Zoster Recombinant(Shingrix) 02/22/2019, 04/22/2019   Zoster, Live 06/11/2010    Screening Tests Health Maintenance  Topic Date Due   DTaP/Tdap/Td (3 - Td or Tdap) 05/30/2020   INFLUENZA VACCINE  09/22/2023   Medicare Annual  Wellness (AWV)  07/18/2024   Pneumonia Vaccine 9+ Years old  Completed   DEXA SCAN  Completed   Zoster Vaccines- Shingrix  Completed   HPV VACCINES  Aged Out   Meningococcal B Vaccine  Aged Out   COVID-19 Vaccine  Discontinued  Health Maintenance  Health Maintenance Due  Topic Date Due   DTaP/Tdap/Td (3 - Td or Tdap) 05/30/2020   Health Maintenance Items Addressed: None needed at this time  Additional Screening:  Vision Screening: Recommended annual ophthalmology exams for early detection of glaucoma and other disorders of the eye.  Dental Screening: Recommended annual dental exams for proper oral hygiene  Community Resource Referral / Chronic Care Management: CRR required this visit?  No   CCM required this visit?  Appt scheduled with PCP   Plan:    I have personally reviewed and noted the following in the patient's chart:   Medical and social history Use of alcohol, tobacco or illicit drugs  Current medications and supplements including opioid prescriptions. Patient is not currently taking opioid prescriptions. Functional ability and status Nutritional status Physical activity Advanced directives List of other physicians Hospitalizations, surgeries, and ER visits in previous 12 months Vitals Screenings to include cognitive, depression, and falls Referrals and appointments  In addition, I have reviewed and discussed with patient certain preventive protocols, quality metrics, and best practice recommendations. A written personalized care plan for preventive services as well as general preventive health recommendations were provided to patient.   Nerissa Bannister, LPN   1/61/0960   After Visit Summary: (MyChart) Due to this being a telephonic visit, the after visit summary with patients personalized plan was offered to patient via MyChart   Notes: Nothing significant to report at this time.

## 2023-07-20 DIAGNOSIS — H02831 Dermatochalasis of right upper eyelid: Secondary | ICD-10-CM | POA: Diagnosis not present

## 2023-07-20 DIAGNOSIS — H26492 Other secondary cataract, left eye: Secondary | ICD-10-CM | POA: Diagnosis not present

## 2023-07-20 DIAGNOSIS — H26493 Other secondary cataract, bilateral: Secondary | ICD-10-CM | POA: Diagnosis not present

## 2023-07-20 DIAGNOSIS — Z961 Presence of intraocular lens: Secondary | ICD-10-CM | POA: Diagnosis not present

## 2023-07-20 DIAGNOSIS — H18413 Arcus senilis, bilateral: Secondary | ICD-10-CM | POA: Diagnosis not present

## 2023-07-27 ENCOUNTER — Telehealth: Payer: Self-pay

## 2023-07-27 MED ORDER — OMEPRAZOLE 20 MG PO CPDR: 20.0000 mg | DELAYED_RELEASE_CAPSULE | Freq: Two times a day (BID) | ORAL | 1 refills | Status: DC

## 2023-07-27 NOTE — Telephone Encounter (Addendum)
 I spoke with Jill Franco;Jill Franco said on and off Jill Franco has burning sensation to side of throat on and off for awhile (Jill Franco could not pin down a timeframe)Jill Franco said episodes are getting worse and more frequent. Last time Jill Franco had issue was last night with burning sensation on right side of throat.no difficulty in swallowing.  Jill Franco said she cannot associate the burning sensation with any activity or food.,No CP or SOB and no vomiting.Jill Franco said alendronate  does not seem to bring on symptoms; Jill Franco takes alendronate  every Sunday morning before she eats or drinks anything and before taking any other meds and Jill Franco sits up for 30  mins following taking alendronate . Jill Franco has tried taking OTC pepcid, tums and [facility] brand med for indigestion. [facility] med helps for short period but relief does not last.UC & ED precautions given and Jill Franco voiced understanding. Jill Franco request cb after Dr Vallarie Gauze reviews this note.sending note to Dr Vallarie Gauze and Vallarie Gauze pool. [facility] [place].

## 2023-07-27 NOTE — Telephone Encounter (Signed)
 Please triage this patient about her sx.  See if her sx are worse around the time she takes alendronate .  Let me know if any dysphagia.  Please see what she tried OTC.

## 2023-07-27 NOTE — Addendum Note (Signed)
 Addended by: Donnie Galea on: 07/27/2023 05:30 PM   Modules accepted: Orders

## 2023-07-27 NOTE — Telephone Encounter (Signed)
 Copied from CRM [id number]. Topic: Clinical - Medication Question >> Jul 27, 2023 10:27 AM Marlan Silva wrote: Reason for CRM: Patient is requesting a rx for indigestion from Dr, Vallarie Gauze to be sent to her pharmacy on file. Pt tried otc medications and nothing is working.

## 2023-07-27 NOTE — Telephone Encounter (Signed)
 I would try taking omeprazole 20mg  BID and I would hold alendronate  for now.    I sent the rx for omeprazole.    Please have her get scheduled if not better in the next week or if worse in the meantime.  If doing better over the next few days then please have her update me in about her status.    Thanks.

## 2023-07-28 DIAGNOSIS — H2512 Age-related nuclear cataract, left eye: Secondary | ICD-10-CM | POA: Diagnosis not present

## 2023-07-28 NOTE — Telephone Encounter (Signed)
 Patient notified. She will give us  a call if she is not doing any better. She is going out of town and will let us  know when she returns.

## 2023-07-30 NOTE — Telephone Encounter (Signed)
 One other issue-how is the swelling in her legs?  If she is still having significant trouble in spite of elevation then let me know.  (For charting purposes and potentially for future reference, she would likely not be a good candidate for lymphedema pump).

## 2023-07-31 NOTE — Telephone Encounter (Signed)
 Left voicemail for patient to return call to office.

## 2023-08-02 NOTE — Telephone Encounter (Signed)
 Checked in with patient today and she said the swelling has gotten a lot better. She believes that since she has decreased her salt intake that has helped a lot. I told her to keep up the good work and to let us  know if she has any other issues.

## 2023-08-02 NOTE — Telephone Encounter (Signed)
 Noted. Thanks.

## 2023-08-22 ENCOUNTER — Ambulatory Visit (HOSPITAL_COMMUNITY)
Admission: RE | Admit: 2023-08-22 | Discharge: 2023-08-22 | Disposition: A | Discharge status: Home / Self Care | Source: Ambulatory Visit | Attending: Physician Assistant | Admitting: Physician Assistant

## 2023-08-22 ENCOUNTER — Ambulatory Visit: Admitting: Physician Assistant

## 2023-08-22 ENCOUNTER — Telehealth: Payer: Self-pay

## 2023-08-22 VITALS — BP 200/87 | HR 87 | Temp 97.8°F | Ht 64.0 in | Wt 108.8 lb

## 2023-08-22 DIAGNOSIS — I6523 Occlusion and stenosis of bilateral carotid arteries: Secondary | ICD-10-CM

## 2023-08-22 DIAGNOSIS — I739 Peripheral vascular disease, unspecified: Secondary | ICD-10-CM | POA: Diagnosis not present

## 2023-08-22 DIAGNOSIS — I7409 Other arterial embolism and thrombosis of abdominal aorta: Secondary | ICD-10-CM

## 2023-08-22 NOTE — Telephone Encounter (Signed)
 Copied from CRM [id number]. Topic: Clinical - Prescription Issue >> Aug 22, 2023 11:30 AM Geroldine GRADE wrote: Reason for CRM: Patient is calling because she states the medication she is taking for her heartburn is not working for her. omeprazole  (PRILOSEC) 20 MG capsule  She states she is still up all night trying to deal with it and she takes the medication just like her directions   She is wondering if something else can be sent in for her

## 2023-08-22 NOTE — Progress Notes (Signed)
 HISTORY AND PHYSICAL     CC:  follow up. Requesting Provider:  Cleatus Arlyss RAMAN, MD  HPI: This is a 89 y.o. female who is here today for follow up.  Pt has hx of  left CIA stent and aortic stent on 03/18/2016 by Dr. Harvey.  She underwent aortogram with right CIA, left CIA, left EIA and left femoral endarterectomy with profundoplasty and bovine patch angioplasty 12/03/2020 by Dr. Magda. She underwent aortogram with angioplasty of bilateral CIA, left EIA stents on 04/30/2021 by Dr. Magda for rest pain.   Pt was last seen on 05/09/2023.  At that time, she was getting some lower extremity swelling.  Her ABI was essentially unchanged but her toe pressures were improved.  She was encouraged to stay as active as possible.    The pt returns today for follow up and here with her daughter.   Pt denies any amaurosis fugax, speech difficulties, weakness, numbness, paralysis or clumsiness or facial droop.    Pt states that both legs hurt when she is walking.  She can't really clarify if her thighs or calves cramp.  She states it is her whole leg.  She has to sit down and the pain improves.  She does have a small place on the left 5th toe that has been present for about a year and has not worsened.  She does not really endorse rest pain at night.  She did quit smoking in January!  The pt is on a statin for cholesterol management.    The pt is on an aspirin .    Other AC:  Xarelto  The pt is on diuretic for hypertension.  The pt is not on medication for diabetes. Tobacco hx:  former  Pt does not have family hx of AAA.    Past Medical History:  Diagnosis Date   Cataract    bilaterally corrected.   COPD (chronic obstructive pulmonary disease) (HCC)    CVA (cerebral vascular accident) (HCC)    Pt was unaware and does not know when it happened.  Was told after some testing was done.   Double vision    Dyspnea    Facial droop 10/2017   left   History of shingles 2012   HLD (hyperlipidemia)    HTN  (hypertension)    Hypothyroidism    PVD (peripheral vascular disease) (HCC)    Vision loss    temporary    Past Surgical History:  Procedure Laterality Date   ABDOMINAL AORTOGRAM N/A 04/30/2021   Procedure: ABDOMINAL AORTOGRAM;  Surgeon: Magda Debby SAILOR, MD;  Location: [facility];  Service: Cardiovascular;  Laterality: N/A;   ABDOMINAL EXPLORATION SURGERY  1950s   they thought I was pregnant; went in to explore   APPENDECTOMY  childhood   BACK SURGERY     BREAST LUMPECTOMY Left 1960's   benign   carotid ultrasound  06/19/2007   0-39% stenosis   CATARACT EXTRACTION W/ INTRAOCULAR LENS IMPLANT Left 09/2016   CATARACT EXTRACTION W/ INTRAOCULAR LENS IMPLANT Right 10/2016   COLONOSCOPY WITH PROPOFOL  N/A 04/08/2019   Procedure: COLONOSCOPY WITH PROPOFOL ;  Surgeon: Teressa Toribio SQUIBB, MD;  Location: [facility];  Service: Endoscopy;  Laterality: N/A;   DILATION AND CURETTAGE OF UTERUS  1960's X 3   miscarriages   DOPPLER ECHOCARDIOGRAPHY  06/19/2007   Normal EF 55-70%   ENDARTERECTOMY FEMORAL Left 12/03/2020   Procedure: LEFT FEMORAL ENDARTERECTOMY AND PROFUNDAPLASTY;  Surgeon: Magda Debby SAILOR, MD;  Location: [facility];  Service:  Vascular;  Laterality: Left;   ILIAC ARTERY STENT Left 03/18/2016   common iliac stent (6 x 40), aortic stent (10 x 19)/notes 03/18/2016   INSERTION OF ILIAC STENT Left 12/03/2020   Procedure: AORTIC STENTING, INSERTION OF BILATERAL COMMON ILIAC STENTS, AND LEFT EXTERNAL ILIAC STENT;  Surgeon: Magda Debby SAILOR, MD;  Location: [facility];  Service: Vascular;  Laterality: Left;   LAMINECTOMY AND MICRODISCECTOMY SPINE  1960's X 2   LOWER EXTREMITY ANGIOGRAM N/A 12/03/2020   Procedure: REUBIN BILE;  Surgeon: Magda Debby SAILOR, MD;  Location: [facility];  Service: Vascular;  Laterality: N/A;   MOLE REMOVAL  07/10/2016   NSVD     x1   PATCH ANGIOPLASTY Left 12/03/2020   Procedure: PATCH ANGIOPLASTY OF LEFT COMMON FEMORAL ARTERY USING BOVINE PERICARDIUM PATCH;   Surgeon: Magda Debby SAILOR, MD;  Location: [facility];  Service: Vascular;  Laterality: Left;   PERIPHERAL VASCULAR BALLOON ANGIOPLASTY Right 04/30/2021   Procedure: PERIPHERAL VASCULAR BALLOON ANGIOPLASTY;  Surgeon: Magda Debby SAILOR, MD;  Location: [facility];  Service: Cardiovascular;  Laterality: Right;  rt common iliac   PERIPHERAL VASCULAR CATHETERIZATION N/A 03/18/2016   Procedure: Abdominal Aortogram w/Lower Extremity;  Surgeon: Carlin FORBES Haddock, MD;  Location: [facility];  Service: Cardiovascular;  Laterality: N/A;   PERIPHERAL VASCULAR CATHETERIZATION  03/18/2016   Procedure: Peripheral Vascular Intervention;  Surgeon: Carlin FORBES Haddock, MD;  [facility] IN[facility] CV [facility];  Service: Cardiovascular;;   PERIPHERAL VASCULAR INTERVENTION Left 04/30/2021   Procedure: PERIPHERAL VASCULAR INTERVENTION;  Surgeon: Magda Debby SAILOR, MD;  Location: [facility];  Service: Cardiovascular;  Laterality: Left;  lt common iliac   POLYPECTOMY  04/08/2019   Procedure: POLYPECTOMY;  Surgeon: Teressa Toribio SQUIBB, MD;  Location: [facility];  Service: Endoscopy;;   ULTRASOUND GUIDANCE FOR VASCULAR ACCESS Right 12/03/2020   Procedure: ULTRASOUND GUIDANCE FOR VASCULAR ACCESS, RIGHT FEMORAL ARTERY;  Surgeon: Magda Debby SAILOR, MD;  Location: [facility] OR;  Service: Vascular;  Laterality: Right;    Allergies  Allergen Reactions   Lipitor [Atorvastatin ] Other (See Comments)    myalgia   Advil [Ibuprofen] Nausea And Vomiting   Aspirin  Other (See Comments)    Uncoated aspirin  causes stomach upset on empty stomach   Codeine Nausea Only and Other (See Comments)    Pill needs to be E.coated   Other Itching and Rash    Nicotine  patches     Current Outpatient Medications  Medication Sig Dispense Refill   acetaminophen  (TYLENOL ) 650 MG CR tablet Take 650 mg by mouth every 8 (eight) hours as needed for pain.     albuterol  (VENTOLIN  HFA) 108 (90 Base) MCG/ACT inhaler USE 2 INHALATIONS BY MOUTH EVERY 6 HOURS AS  NEEDED FOR WHEEZING  OR SHORTNESS OF BREATH 34 g 2   alendronate  (FOSAMAX ) 70 MG tablet TAKE 1 TABLET BY MOUTH WEEKLY  WITH 8 OZ OF PLAIN WATER 30  MINUTES BEFORE FIRST FOOD, DRINK OR MEDS. STAY UPRIGHT FOR 30  MINS 12 tablet 3   aspirin  EC 81 MG tablet Take 1 tablet (81 mg total) by mouth daily. Swallow whole. 90 tablet 3   budesonide -formoterol  (SYMBICORT ) 160-4.5 MCG/ACT inhaler Inhale 2 puffs into the lungs in the morning and at bedtime. Rinse after use. 30.6 g 3   Cholecalciferol  (VITAMIN D3) 25 MCG (1000 UT) CAPS Take 1 capsule (1,000 Units total) by mouth daily.     diclofenac  Sodium (VOLTAREN ) 1 % GEL Apply 2 g topically 4 (four) times  daily. 50 g 0   furosemide  (LASIX ) 20 MG tablet Take 2 tablets (40 mg total) by mouth daily.     ipratropium-albuterol  (DUONEB) 0.5-2.5 (3) MG/3ML SOLN Take 3 mLs by nebulization every 6 (six) hours as needed. 360 mL 1   levothyroxine  (SYNTHROID ) 50 MCG tablet TAKE 1 TABLET BY MOUTH DAILY  EXCEPT 2 TABLETS ON SUNDAYS AND  WEDNESDAYS ( TOTAL 9 TABS PER  WEEK) 130 tablet 2   melatonin 5 MG TABS Take 5 mg by mouth at bedtime as needed (For sleep).     methocarbamol  (ROBAXIN ) 500 MG tablet Take 1 tablet (500 mg total) by mouth 2 (two) times daily as needed for muscle spasms. 60 tablet 1   nicotine  (NICODERM CQ  - DOSED IN MG/24 HOURS) 14 mg/24hr patch Place 14 mg onto the skin daily.     omeprazole  (PRILOSEC) 20 MG capsule Take 1 capsule (20 mg total) by mouth 2 (two) times daily before a meal. 60 capsule 1   rivaroxaban  (XARELTO ) 2.5 MG TABS tablet TAKE 1 TABLET BY MOUTH TWICE  DAILY 200 tablet 0   rosuvastatin  (CRESTOR ) 20 MG tablet TAKE 1 TABLET BY MOUTH DAILY 100 tablet 2   SPIRIVA  HANDIHALER 18 MCG inhalation capsule INHALE THE CONTENTS OF 1 CAPSULE BY MOUTH VIA INHALATION DEVICE  DAILY 90 capsule 3   No current facility-administered medications for this visit.    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  Tobacco Use   Smoking status: Former    Current packs/day: 0.00    Average packs/day: 0.3 packs/day for 69.0 years (17.3 ttl pk-yrs)    Types: Cigarettes    Start date: 01/1952    Quit date: 01/2021    Years since quitting: 2.5    Passive exposure: Current   Smokeless tobacco: Never  Vaping Use   Vaping status: Never Used  Substance and Sexual Activity   Alcohol use: No    Alcohol/week: 0.0 standard drinks of alcohol   Drug use: No   Sexual activity: Not on file  Other Topics Concern   Not on file  Social History Narrative   From [place]   Retired 2007   Widowed 2008 after 49 years   Enjoys yard work, but needs some help with yard work now.     Her daughter is helping at home some (some as of 2022).  Her daughter has vision loss and is widowed as of 2022 and moved in with patient.   Social Drivers of Corporate investment banker Strain: Low Risk  (07/19/2023)   Overall Financial Resource Strain (CARDIA)    Difficulty of Paying Living Expenses: Not hard at all  Food Insecurity: No Food Insecurity (07/19/2023)   Hunger Vital Sign    Worried About Running Out of Food in the Last Year: Never true    Ran Out  of Food in the Last Year: Never true  Transportation Needs: No Transportation Needs (07/19/2023)   PRAPARE - Administrator, Civil Service (Medical): No    Lack of Transportation (Non-Medical): No  Physical Activity: Insufficiently Active (07/19/2023)   Exercise Vital Sign    Days of Exercise per Week: 3 days    Minutes of Exercise per  Session: 30 min  Stress: No Stress Concern Present (07/19/2023)   [employer] of Occupational Health - Occupational Stress Questionnaire    Feeling of Stress : Not at all  Social Connections: Socially Isolated (07/19/2023)   Social Connection and Isolation Panel    Frequency of Communication with Friends and Family: More than three times a week    Frequency of Social Gatherings with Friends and Family: More than three times a week    Attends Religious Services: Never    Database administrator or Organizations: No    Attends Banker Meetings: Never    Marital Status: Widowed  Intimate Partner Violence: Not At Risk (07/19/2023)   Humiliation, Afraid, Rape, and Kick questionnaire    Fear of Current or Ex-Partner: No    Emotionally Abused: No    Physically Abused: No    Sexually Abused: No     REVIEW OF SYSTEMS:   [X]  denotes positive finding, [ ]  denotes negative finding Cardiac  Comments:  Chest pain or chest pressure:    Shortness of breath upon exertion:    Short of breath when lying flat:    Irregular heart rhythm:        Vascular    Pain in calf, thigh, or hip brought on by ambulation: x   Pain in feet at night that wakes you up from your sleep:     Blood clot in your veins:    Leg swelling:         Pulmonary    Oxygen at home:    Wheezing:         Neurologic    Sudden weakness in arms or legs:     Sudden numbness in arms or legs:     Sudden onset of difficulty speaking or understanding others    Temporary loss of vision in one eye:     Problems with dizziness:         Gastrointestinal    Blood in stool:     Vomited blood:         Genitourinary    Burning when urinating:     Blood in urine:        Psychiatric    Major depression:         Hematologic    Bleeding problems:    Problems with blood clotting too easily:        Skin    Rashes or ulcers:        Constitutional    Fever or chills:      PHYSICAL EXAMINATION:  Today's  Vitals   08/22/23 0908 08/22/23 0911  BP: (!) 200/87   Pulse: 87   Temp: 97.8 F (36.6 C)   TempSrc: Temporal   SpO2: 94%   Weight: 108 lb 12.8 oz (49.4 kg)   Height: 5' 4 (1.626 m)   PainSc: 9  9    Body mass index is 18.68 kg/m.   General:  WDWN in NAD; vital signs documented above Gait: Not observed HENT: WNL, normocephalic Pulmonary: normal non-labored breathing  Cardiac: irregular HR;  without carotid bruits  Abdomen: soft, NT, aortic pulse is not palpable Skin: without rashes Vascular Exam/Pulses:  Right Left  Radial 2+ (normal) 2+ (normal)  Femoral 1+ (weak) 2+ (normal)  DP monophasic monophasic  PT monophasic monophasic  Peroneal monophasic monophasic   Extremities: no gangrenous changes Left 5th toe  Musculoskeletal: no muscle wasting or atrophy  Neurologic: A&O X 3;  speech is fluent/normal; moving all extremities equally  Psychiatric:  The pt has Normal affect.   Non-Invasive Vascular Imaging:   Arterial duplex on 08/22/2023: Abdominal Aorta Findings:  +-------------+-------+----------+----------+----------+--------+--------+  Location    AP (cm)Trans (cm)PSV (cm/s)Waveform  ThrombusComments  +-------------+-------+----------+----------+----------+--------+--------+  Proximal                     68        monophasic                  +-------------+-------+----------+----------+----------+--------+--------+  RT EIA Distal                 110       monophasic                  +-------------+-------+----------+----------+----------+--------+--------+  LT EIA Mid                    354       biphasic                    +-------------+-------+----------+----------+----------+--------+--------+  LT EIA Distal                 124       monophasic                  +-------------+-------+----------+----------+----------+--------+--------+   Summary:  Stenosis: +-------------------+- Location           Stent             +-------------------+---------------+  Left External Iliac50-99% stenosis  +-------------------+---------------+   Significantly limited visualization of the abdominal vasculature due to overlying bowel gas.   Carotid Duplex on 08/22/2023: Right:  1-39% ICA stenosis Left:  40-59% ICA stenosis  Previous ABI's/TBI's on 05/01/2023: Right:  0.61/0.33 - great toe pressure:  64 Left:  0.52/0.24 - great toe pressure:  46   Previous Carotid duplex on 04/26/2019: Right: 1-39% ICA stenosis Left:   40-59% ICA stenosis    ASSESSMENT/PLAN:: 89 y.o. female here for follow up for PAD and carotid stenosis and has hx of left CIA stent and aortic stent on 03/18/2016 by Dr. Harvey.  She underwent aortogram with right CIA, left CIA, left EIA and left femoral endarterectomy with profundoplasty and bovine patch angioplasty 12/03/2020 by Dr. Magda. She underwent aortogram with angioplasty of bilateral CIA, left EIA stents on 04/30/2021 by Dr. Magda for rest pain.    PAD -duplex reveals elevated velocities in the left EIA and the right CIA was not able to be seen on duplex today due to bowel gas.  She does have an easily palpable left femoral pulse and a 1+ right femoral pulse.   -pt does have pain in both legs with walking.  Discussed with Dr. Magda and will get CTA a/p with runoff and he will see her back. -encouraged her to continue to use her machine that keeps her legs moving.   -she does have some mild BLE edema and does have some hemosiderin staining so most likely some venous insufficiency.   -pt will f/u in the next 2-3 weeks  with CTA a/p with runoff and see Dr. Magda  Carotid stenosis -duplex today reveals unchanged from 2021 and she remains asymptomatic -discussed s/s of stroke with pt and she understands should she develop any of these sx, she will go to the nearest ER or call 911. -pt will f/u in one year with carotid duplex  Hypertension -discussed taking BP bid and writing it down for  her PCP so they can have an idea of how to treat her.  She was formerly on medication but made her very dizzy so she stopped taking this.  Discussed she needs better control as it could cause a stroke.   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  Encouraged her to continue this!  -continue statin/asa/Xarelto    Lucie Apt, [facility] [phone]  Clinic MD:   Magda

## 2023-08-23 ENCOUNTER — Ambulatory Visit: Payer: Self-pay

## 2023-08-23 MED ORDER — PANTOPRAZOLE SODIUM 40 MG PO TBEC
40.0000 mg | DELAYED_RELEASE_TABLET | Freq: Two times a day (BID) | ORAL | 3 refills | Status: DC
Start: 2023-08-23 — End: 2023-12-07

## 2023-08-23 NOTE — Addendum Note (Signed)
 Addended by: CLEATUS ARLYSS RAMAN on: 08/23/2023 04:14 PM   Modules accepted: Orders

## 2023-08-23 NOTE — Telephone Encounter (Signed)
Closing encounter patient notified

## 2023-08-23 NOTE — Telephone Encounter (Signed)
 Patient notified

## 2023-08-23 NOTE — Telephone Encounter (Signed)
 Does she think that taking alendronate  is making it worse?    Would try holding alendronate  for 2 week and see if she improves.   Could change to protonix  twice a day and stop omeprazole .  Let me know if that isn't helping.  Sent rx locally, [facility] [place].    Thanks.

## 2023-08-23 NOTE — Telephone Encounter (Signed)
 FYI Only or Action Required?: Action required by provider: clinical question for provider and update on patient condition.  Patient was last seen in primary care on 06/01/2023 by Cleatus Arlyss RAMAN, MD. Called Nurse Triage reporting Heartburn. Symptoms began several months ago. Interventions attempted: Prescription medications: omeprazole . Symptoms are: unchanged.  Triage Disposition: Call PCP When Office is Open  Patient/caregiver understands and will follow disposition?: Yes                             Copied from CRM [id number]. Topic: Clinical - Red Word Triage >> Aug 23, 2023  4:03 PM Marissa P wrote: Red Word that prompted transfer to Nurse Triage:  Patient is seeing if doctor can get her a different medication for the indigestion please, she took the medication omeprazole  (PRILOSEC) 20 MG capsule that was originally prescribed as instructed but it did not help any. Please advise. Lots of heart burn very uncomfortable really needs some help. Reason for Disposition  Caller wants to use a complementary or alternative medicine  Answer Assessment - Initial Assessment Questions 1. NAME of MEDICINE: What medicine(s) are you calling about?     Omeprazole   2. QUESTION: What is your question? (e.g., double dose of medicine, side effect)     Patient is requesting an alternative medication to be prescribed- states heartburn is not improving 3. PRESCRIBER: Who prescribed the medicine? Reason: if prescribed by specialist, call should be referred to that group.     PCP 4. SYMPTOMS: Do you have any symptoms? If Yes, ask: What symptoms are you having?  How bad are the symptoms (e.g., mild, moderate, severe)     Heartburn, denies additional symptoms  Protocols used: Medication Question Call-A-AH

## 2023-08-27 ENCOUNTER — Other Ambulatory Visit: Payer: Self-pay | Admitting: Family Medicine

## 2023-08-27 DIAGNOSIS — I739 Peripheral vascular disease, unspecified: Secondary | ICD-10-CM

## 2023-08-28 ENCOUNTER — Other Ambulatory Visit: Payer: Self-pay

## 2023-08-28 DIAGNOSIS — I739 Peripheral vascular disease, unspecified: Secondary | ICD-10-CM

## 2023-08-31 ENCOUNTER — Ambulatory Visit: Admitting: Family Medicine

## 2023-09-07 ENCOUNTER — Other Ambulatory Visit

## 2023-09-07 ENCOUNTER — Ambulatory Visit
Admission: RE | Admit: 2023-09-07 | Discharge: 2023-09-07 | Disposition: A | Discharge status: Home / Self Care | Source: Ambulatory Visit | Attending: Vascular Surgery | Admitting: Vascular Surgery

## 2023-09-07 DIAGNOSIS — I739 Peripheral vascular disease, unspecified: Secondary | ICD-10-CM

## 2023-09-07 DIAGNOSIS — R911 Solitary pulmonary nodule: Secondary | ICD-10-CM | POA: Diagnosis not present

## 2023-09-07 DIAGNOSIS — K551 Chronic vascular disorders of intestine: Secondary | ICD-10-CM | POA: Diagnosis not present

## 2023-09-07 DIAGNOSIS — E278 Other specified disorders of adrenal gland: Secondary | ICD-10-CM | POA: Diagnosis not present

## 2023-09-07 MED ORDER — IOPAMIDOL (ISOVUE-370) INJECTION 76%
125.0000 mL | Freq: Once | INTRAVENOUS | Status: AC | PRN
  Administered 2023-09-07: 125 mL via INTRAVENOUS

## 2023-09-13 ENCOUNTER — Other Ambulatory Visit: Payer: Self-pay | Admitting: Family Medicine

## 2023-09-25 NOTE — Progress Notes (Unsigned)
 [facility] [place]  ASSESSMENT / PLAN: Jill Franco is a 89 y.o. female with atherosclerosis of native and stented aortoiliac occlusive disease as well as bilateral lower extremity arteries initially causing causing disabling claudication. Now status post left femoral endarterectomy and profundaplasty, complete endovascular reconstruction of aortic bifurcation (CERAB) 12/03/20. This required endovascular revision 04/30/21 for mechanical compression of the iliac stenting.   Recommend the following which can slow the progression of atherosclerosis and reduce the risk of major adverse cardiac / limb events:  Complete cessation from all tobacco products. Blood glucose control with goal A1c < 7%. Blood pressure control with goal blood pressure < 140/90 mmHg. Lipid reduction therapy with goal LDL-C <100 mg/dL (<29 if symptomatic from PAD).  Aspirin  81mg  PO QD.  Atorvastatin  40-80mg  PO QD (or other high intensity statin therapy).  Follow-up in 3 months with repeat noninvasive testing.  CHIEF COMPLAINT: Calf pain with walking  HISTORY OF PRESENT ILLNESS: Jill Franco is a 89 y.o. female who returns to clinic for surveillance of peripheral arterial disease.  She reports deteriorating claudication symptoms.  She is now disabled by her symptoms.  She can only walk a short distance before severe pain sets in bilateral calves.  She has no wounds about her feet.  She occasionally has pain in her foot at rest, but does not report symptoms typical of ischemic rest pain.  She continues to smoke, but is working hard to quit.  She is using patches with some success.  10/27/20: patient returns after evaluation by Dr. Ladona. Pending stress test and echo. No interval changes.  01/11/21: Patient returns after hybrid revascularization.  She reports complete symptomatic relief of claudication.  She is walking as far as she likes.  She is appreciative.   04/20/21: Patient returns for  follow-up evaluation.  She has had return of severe symptoms in her bilateral lower extremities, left worse than right.  She endorses early rest pain symptoms in her left foot.  She has short distance, disabling claudication in both calves.  06/08/21: Patient returns to clinic.  After intervention, the patient had early relief of her symptoms, but they have now returned.  We had a long discussion about our options going forward.  She is maximally medically managed for peripheral arterial disease.  Her symptoms are not yet disabling to her.  09/07/21: patient returns to clinic and reports his symptoms have improved. She has started on Xarelto . She attributes improvement in her symptoms to Xarelto .   09/26/23: Patient presents to clinic to review CT angiogram results.  In brief, she had been under surveillance with our group's physician assistants, duplex noted left external iliac artery stenosis versus in-stent stenosis.  CT angiogram was performed.  This does show some mild stenosis in the left external iliac stent as well as in the distal left external iliac artery which is unstented.  I reviewed these results with her in detail.  I counseled her that endovascular intervention would require a general anesthetic and left brachial artery access.  She does have some claudication type symptoms, but not enough in her mind to warrant reintervention.  I think this is reasonable given her advanced age and the requirement for general anesthetic for intervention.  We ultimately agreed to monitor her with close serial follow-up.  VASCULAR SURGICAL HISTORY:  03/18/16 -- left common iliac stent (6 x 40), terminal aortic stent (10 x 19)  (Fields)  12/03/20 --  1) US  guided right common femoral artery access  2) Aortogram and pelvic angiogram 3) Terminal aortic stenting (11x8mm VBX post dilated to ~39mm) 4) Right common iliac artery stenting (7x53mm VBX post dilated to 9mm proximally) 5) Left common iliac artery  stenting (7x3mm VBX post dilated to 9mm proximally) 6) left external iliac artery stenting (7x81mm viabahn) 7) left femoral endarterectomy, profundaplasty, and patch angioplasty (bovine pericardium)  04/30/21 -- Angioplasty of bilateral common iliac artery stents (7x22mm Mustang) Left external iliac artery stenting (7x39mm Eluvia)  VASCULAR RISK FACTORS: Positive history of stroke / transient ischemic attack. Negative history of coronary artery disease.  Negative history of diabetes mellitus. Last A1c 5.7. Positive history of smoking. Not actively smoking. Positive history of hypertension.  No reported history of chronic kidney disease but last GFR 58-73. CKD stage 2 - 3a. Negative history of chronic obstructive pulmonary disease, but patient has been treated with inhalers with symptomatic relief.   FUNCTIONAL STATUS: ECOG performance status: (1) Restricted in physically strenuous activity, ambulatory and able to do work of light nature Ambulatory status: Ambulatory within the community with limits  Past Medical History:  Diagnosis Date   Cataract    bilaterally corrected.   COPD (chronic obstructive pulmonary disease) (HCC)    CVA (cerebral vascular accident) (HCC)    Pt was unaware and does not know when it happened.  Was told after some testing was done.   Double vision    Dyspnea    Facial droop 10/2017   left   History of shingles 2012   HLD (hyperlipidemia)    HTN (hypertension)    Hypothyroidism    PVD (peripheral vascular disease) (HCC)    Vision loss    temporary    Past Surgical History:  Procedure Laterality Date   ABDOMINAL AORTOGRAM N/A 04/30/2021   Procedure: ABDOMINAL AORTOGRAM;  Surgeon: Jill Debby SAILOR, MD;  Location: [facility];  Service: Cardiovascular;  Laterality: N/A;   ABDOMINAL EXPLORATION SURGERY  1950s   they thought I was pregnant; went in to explore   APPENDECTOMY  childhood   BACK SURGERY     BREAST LUMPECTOMY Left 1960's    benign   carotid ultrasound  06/19/2007   0-39% stenosis   CATARACT EXTRACTION W/ INTRAOCULAR LENS IMPLANT Left 09/2016   CATARACT EXTRACTION W/ INTRAOCULAR LENS IMPLANT Right 10/2016   COLONOSCOPY WITH PROPOFOL  N/A 04/08/2019   Procedure: COLONOSCOPY WITH PROPOFOL ;  Surgeon: Teressa Toribio SQUIBB, MD;  Location: [facility];  Service: Endoscopy;  Laterality: N/A;   DILATION AND CURETTAGE OF UTERUS  1960's X 3   miscarriages   DOPPLER ECHOCARDIOGRAPHY  06/19/2007   Normal EF 55-70%   ENDARTERECTOMY FEMORAL Left 12/03/2020   Procedure: LEFT FEMORAL ENDARTERECTOMY AND PROFUNDAPLASTY;  Surgeon: Jill Debby SAILOR, MD;  Location: [facility];  Service: Vascular;  Laterality: Left;   ILIAC ARTERY STENT Left 03/18/2016   common iliac stent (6 x 40), aortic stent (10 x 19)/notes 03/18/2016   INSERTION OF ILIAC STENT Left 12/03/2020   Procedure: AORTIC STENTING, INSERTION OF BILATERAL COMMON ILIAC STENTS, AND LEFT EXTERNAL ILIAC STENT;  Surgeon: Jill Debby SAILOR, MD;  Location: [facility];  Service: Vascular;  Laterality: Left;   LAMINECTOMY AND MICRODISCECTOMY SPINE  1960's X 2   LOWER EXTREMITY ANGIOGRAM N/A 12/03/2020   Procedure: REUBIN BILE;  Surgeon: Jill Debby SAILOR, MD;  Location: [facility];  Service: Vascular;  Laterality: N/A;   MOLE REMOVAL  07/10/2016   NSVD     x1   PATCH ANGIOPLASTY Left 12/03/2020   Procedure:  PATCH ANGIOPLASTY OF LEFT COMMON FEMORAL ARTERY USING BOVINE PERICARDIUM PATCH;  Surgeon: Jill Debby SAILOR, MD;  Location: [facility];  Service: Vascular;  Laterality: Left;   PERIPHERAL VASCULAR BALLOON ANGIOPLASTY Right 04/30/2021   Procedure: PERIPHERAL VASCULAR BALLOON ANGIOPLASTY;  Surgeon: Jill Debby SAILOR, MD;  Location: [facility];  Service: Cardiovascular;  Laterality: Right;  rt common iliac   PERIPHERAL VASCULAR CATHETERIZATION N/A 03/18/2016   Procedure: Abdominal Aortogram w/Lower Extremity;  Surgeon: Carlin FORBES Haddock, MD;  [facility];  Service:  Cardiovascular;  Laterality: N/A;   PERIPHERAL VASCULAR CATHETERIZATION  03/18/2016   Procedure: Peripheral Vascular Intervention;  Surgeon: Carlin FORBES Haddock, MD;  Location: [facility] INVASIVE CV LAB;  Service: Cardiovascular;;   PERIPHERAL VASCULAR INTERVENTION Left 04/30/2021   Procedure: PERIPHERAL VASCULAR INTERVENTION;  Surgeon: Jill Debby SAILOR, MD;  Location: [facility];  Service: Cardiovascular;  Laterality: Left;  lt common iliac   POLYPECTOMY  04/08/2019   Procedure: POLYPECTOMY;  Surgeon: Teressa Toribio SQUIBB, MD;  Location: [facility];  Service: Endoscopy;;   ULTRASOUND GUIDANCE FOR VASCULAR ACCESS Right 12/03/2020   Procedure: ULTRASOUND GUIDANCE FOR VASCULAR ACCESS, RIGHT FEMORAL ARTERY;  Surgeon: Jill Debby SAILOR, MD;  Location: [facility];  Service: Vascular;  Laterality: Right;    Family History  Problem Relation Age of Onset   Stroke Father 49   Cancer Brother    Colon cancer Neg Hx    Breast cancer Neg Hx    Colon polyps Neg Hx    Esophageal cancer Neg Hx    Rectal cancer Neg Hx    Stomach cancer Neg Hx     Social History   Socioeconomic History   Marital status: Widowed    Spouse name: Not on file   Number of children: 1   Years of education: Not on file   Highest education level: Not on file  Occupational History   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  Tobacco Use   Smoking status: Former    Current packs/day: 0.00    Average packs/day: 0.3 packs/day for 69.0 years (17.3 ttl pk-yrs)    Types: Cigarettes    Start date: 01/1952    Quit date: 01/2021    Years since quitting: 2.6    Passive exposure: Current   Smokeless tobacco: Never  Vaping Use   Vaping status: Never Used  Substance and Sexual Activity   Alcohol use: No    Alcohol/week: 0.0 standard drinks of alcohol   Drug use: No   Sexual activity: Not on file  Other Topics Concern   Not on file  Social History Narrative   From [place]   Retired 2007   Widowed 2008 after 49  years   Enjoys yard work, but needs some help with yard work now.     Her daughter is helping at home some (some as of 2022).  Her daughter has vision loss and is widowed as of 2022 and moved in with patient.   Social Drivers of Corporate investment banker Strain: Low Risk  (07/19/2023)   Overall Financial Resource Strain (CARDIA)    Difficulty of Paying Living Expenses: Not hard at all  Food Insecurity: No Food Insecurity (07/19/2023)   Hunger Vital Sign    Worried About Running Out of Food in the Last Year: Never true    Ran Out of Food in the Last Year: Never true  Transportation Needs: No Transportation Needs (07/19/2023)   PRAPARE -  Administrator, Civil Service (Medical): No    Lack of Transportation (Non-Medical): No  Physical Activity: Insufficiently Active (07/19/2023)   Exercise Vital Sign    Days of Exercise per Week: 3 days    Minutes of Exercise per Session: 30 min  Stress: No Stress Concern Present (07/19/2023)   [employer] of Occupational Health - Occupational Stress Questionnaire    Feeling of Stress : Not at all  Social Connections: Socially Isolated (07/19/2023)   Social Connection and Isolation Panel    Frequency of Communication with Friends and Family: More than three times a week    Frequency of Social Gatherings with Friends and Family: More than three times a week    Attends Religious Services: Never    Database administrator or Organizations: No    Attends Banker Meetings: Never    Marital Status: Widowed  Intimate Partner Violence: Not At Risk (07/19/2023)   Humiliation, Afraid, Rape, and Kick questionnaire    Fear of Current or Ex-Partner: No    Emotionally Abused: No    Physically Abused: No    Sexually Abused: No    Allergies  Allergen Reactions   Lipitor [Atorvastatin ] Other (See Comments)    myalgia   Advil [Ibuprofen] Nausea And Vomiting   Aspirin  Other (See Comments)    Uncoated aspirin  causes stomach upset on  empty stomach   Codeine Nausea Only and Other (See Comments)    Pill needs to be E.coated   Other Itching and Rash    Nicotine  patches     Current Outpatient Medications  Medication Sig Dispense Refill   acetaminophen  (TYLENOL ) 650 MG CR tablet Take 650 mg by mouth every 8 (eight) hours as needed for pain.     albuterol  (VENTOLIN  HFA) 108 (90 Base) MCG/ACT inhaler USE 2 INHALATIONS BY MOUTH EVERY 6 HOURS AS NEEDED FOR WHEEZING  OR SHORTNESS OF BREATH 34 g 2   alendronate  (FOSAMAX ) 70 MG tablet TAKE 1 TABLET BY MOUTH WEEKLY  WITH 8 OZ OF PLAIN WATER 30  MINUTES BEFORE FIRST FOOD, DRINK OR MEDS. STAY UPRIGHT FOR 30  MINS 12 tablet 3   aspirin  EC 81 MG tablet Take 1 tablet (81 mg total) by mouth daily. Swallow whole. 90 tablet 3   budesonide -formoterol  (SYMBICORT ) 160-4.5 MCG/ACT inhaler Inhale 2 puffs into the lungs in the morning and at bedtime. Rinse after use. 30.6 g 3   Cholecalciferol  (VITAMIN D3) 25 MCG (1000 UT) CAPS Take 1 capsule (1,000 Units total) by mouth daily.     diclofenac  Sodium (VOLTAREN ) 1 % GEL Apply 2 g topically 4 (four) times daily. 50 g 0   furosemide  (LASIX ) 20 MG tablet Take 2 tablets (40 mg total) by mouth daily.     ipratropium-albuterol  (DUONEB) 0.5-2.5 (3) MG/3ML SOLN Take 3 mLs by nebulization every 6 (six) hours as needed. 360 mL 1   levothyroxine  (SYNTHROID ) 50 MCG tablet TAKE 1 TABLET BY MOUTH DAILY  EXCEPT 2 TABLETS ON SUNDAYS AND  WEDNESDAYS ( TOTAL 9 TABS PER  WEEK) 130 tablet 2   melatonin 5 MG TABS Take 5 mg by mouth at bedtime as needed (For sleep).     methocarbamol  (ROBAXIN ) 500 MG tablet TAKE 1 TABLET BY MOUTH TWICE DAILY AS NEEDED FOR MUSCLE SPASM 60 tablet 0   nicotine  (NICODERM CQ  - DOSED IN MG/24 HOURS) 14 mg/24hr patch Place 14 mg onto the skin daily.     pantoprazole  (PROTONIX ) 40 MG tablet Take  1 tablet (40 mg total) by mouth 2 (two) times daily. 60 tablet 3   rivaroxaban  (XARELTO ) 2.5 MG TABS tablet TAKE 1 TABLET BY MOUTH TWICE  DAILY 200 tablet  1   rosuvastatin  (CRESTOR ) 20 MG tablet TAKE 1 TABLET BY MOUTH DAILY 100 tablet 1   SPIRIVA  HANDIHALER 18 MCG inhalation capsule INHALE THE CONTENTS OF 1 CAPSULE BY MOUTH VIA INHALATION DEVICE  DAILY 90 capsule 3   No current facility-administered medications for this visit.    PHYSICAL EXAM Vitals:   09/26/23 0834  BP: (!) 186/74  Pulse: 63  Temp: 97.8 F (36.6 C)  SpO2: 96%  Weight: 111 lb 14.4 oz (50.8 kg)  Height: 5' 4 (1.626 m)   Elderly woman in no distress Regular rate and rhythm Unlabored breathing Palpable femoral pulses No palpable pedal pulses  PERTINENT LABORATORY AND RADIOLOGIC DATA  Most recent CBC    Latest Ref Rng & Units 04/24/2023   11:25 AM 03/27/2023    8:45 AM 03/22/2023    4:47 AM  CBC  WBC 4.0 - 10.5 K/uL 7.1  11.9  10.0   Hemoglobin 12.0 - 15.0 g/dL 85.9  83.2  84.1   Hematocrit 36.0 - 46.0 % 42.1  50.4  46.8   Platelets 150.0 - 400.0 K/uL 331.0  196.0  150      Most recent CMP    Latest Ref Rng & Units 05/16/2023    9:48 AM 04/24/2023   11:25 AM 03/27/2023    8:45 AM  CMP  Glucose 70 - 99 mg/dL 92  97  97   BUN 6 - 23 mg/dL 17  22  21    Creatinine 0.40 - 1.20 mg/dL 9.28  9.36  9.15   Sodium 135 - 145 mEq/L 138  141  139   Potassium 3.5 - 5.1 mEq/L 4.0  4.7  3.3   Chloride 96 - 112 mEq/L 99  101  99   CO2 19 - 32 mEq/L 31  32  30   Calcium  8.4 - 10.5 mg/dL 9.2  9.9  9.1   Total Protein 6.0 - 8.3 g/dL  6.2  5.9   Total Bilirubin 0.2 - 1.2 mg/dL  0.5  1.0   Alkaline Phos 39 - 117 U/L  65  69   AST 0 - 37 U/L  19  29   ALT 0 - 35 U/L  17  49     Renal function CrCl cannot be calculated (Patient's most recent lab result is older than the maximum 21 days allowed.).  Hgb A1c MFr Bld (%)  Date Value  09/18/2020 5.7    LDL Cholesterol  Date Value Ref Range Status  11/21/2022 64 0 - 99 mg/dL Final   Direct LDL  Date Value Ref Range Status  06/19/2012 115.8 mg/dL Final    Comment:    Optimal:  <100 mg/dLNear or Above Optimal:  100-129  mg/dLBorderline High:  130-159 mg/dLHigh:  160-189 mg/dLVery High:  >190 mg/dL     CT angiogram Aortic and bilateral common iliac artery stenting widely patent Left external iliac stenting widely patent There is some stenosis in the distal left external iliac artery   Debby Franco. Magda, MD [facility] Phone Number: [phone] 09/26/2023 4:15 PM

## 2023-09-26 ENCOUNTER — Encounter: Payer: Self-pay | Admitting: Vascular Surgery

## 2023-09-26 ENCOUNTER — Ambulatory Visit: Attending: Vascular Surgery | Admitting: Vascular Surgery

## 2023-09-26 VITALS — BP 186/74 | HR 63 | Temp 97.8°F | Ht 64.0 in | Wt 111.9 lb

## 2023-09-26 DIAGNOSIS — I739 Peripheral vascular disease, unspecified: Secondary | ICD-10-CM

## 2023-09-27 ENCOUNTER — Other Ambulatory Visit: Payer: Self-pay

## 2023-09-27 ENCOUNTER — Telehealth: Payer: Self-pay

## 2023-09-27 DIAGNOSIS — M25561 Pain in right knee: Secondary | ICD-10-CM

## 2023-09-27 DIAGNOSIS — I7409 Other arterial embolism and thrombosis of abdominal aorta: Secondary | ICD-10-CM

## 2023-09-27 NOTE — Telephone Encounter (Signed)
 I can put in the referral but please get extra details about the reason.  I did not see that listed in the office visit note.  Thanks.

## 2023-09-27 NOTE — Telephone Encounter (Signed)
 Copied from CRM #[id number]. Topic: Referral - Question >> Sep 27, 2023  1:36 PM Pinkey ORN wrote: Reason for CRM: Orthopedic >> Sep 27, 2023  1:38 PM Pinkey ORN wrote: Patient called in stating that Debby LOISE Robertson, MD recommends she see an orthopedic specalist. Patient is requesting that Cleatus Arlyss RAMAN, MD refers her over to someone. Any further questions or concerns, patient call back number is [phone]

## 2023-09-28 NOTE — Telephone Encounter (Signed)
I put in the referral.  Thanks.  

## 2023-09-28 NOTE — Addendum Note (Signed)
 Addended by: CLEATUS ARLYSS RAMAN on: 09/28/2023 04:51 PM   Modules accepted: Orders

## 2023-09-28 NOTE — Telephone Encounter (Signed)
 Reached out to the patient and she said that Dr. Magda wanted to know why her knee hurts so bad. She said that her right knee is the one that is giving her problems and he suggested that she see someone.

## 2023-09-29 NOTE — Telephone Encounter (Signed)
 Patient notified

## 2023-10-06 ENCOUNTER — Other Ambulatory Visit: Payer: Self-pay | Admitting: Family Medicine

## 2023-10-06 DIAGNOSIS — I739 Peripheral vascular disease, unspecified: Secondary | ICD-10-CM

## 2023-10-06 MED ORDER — RIVAROXABAN 2.5 MG PO TABS: 2.5000 mg | ORAL_TABLET | Freq: Two times a day (BID) | ORAL | 0 refills | Status: DC

## 2023-10-06 NOTE — Telephone Encounter (Signed)
 Copied from CRM #[id number]. Topic: Clinical - Pink Word Triage >> Oct 06, 2023 11:56 AM Ismael A wrote: Reason for Triage: Patient is out of medication Xarelto - rivaroxaban (XARELTO) 2.5 MG TABS tablet >> Oct 06, 2023 11:57 AM Ismael A wrote: Patient is out of Xarelto

## 2023-10-06 NOTE — Telephone Encounter (Signed)
 This encounter was created in error - please disregard. Please see refill request today that has been routed to clinic.

## 2023-10-24 ENCOUNTER — Other Ambulatory Visit (INDEPENDENT_AMBULATORY_CARE_PROVIDER_SITE_OTHER)

## 2023-10-24 ENCOUNTER — Telehealth: Payer: Self-pay | Admitting: Family Medicine

## 2023-10-24 ENCOUNTER — Ambulatory Visit (INDEPENDENT_AMBULATORY_CARE_PROVIDER_SITE_OTHER): Admitting: Orthopaedic Surgery

## 2023-10-24 ENCOUNTER — Other Ambulatory Visit: Payer: Self-pay

## 2023-10-24 DIAGNOSIS — G8929 Other chronic pain: Secondary | ICD-10-CM

## 2023-10-24 DIAGNOSIS — M25562 Pain in left knee: Secondary | ICD-10-CM

## 2023-10-24 DIAGNOSIS — M25561 Pain in right knee: Secondary | ICD-10-CM | POA: Diagnosis not present

## 2023-10-24 MED ORDER — LIDOCAINE HCL 1 % IJ SOLN
2.0000 mL | INTRAMUSCULAR | Status: AC | PRN
  Administered 2023-10-24: 2 mL

## 2023-10-24 MED ORDER — METHYLPREDNISOLONE ACETATE 40 MG/ML IJ SUSP
40.0000 mg | INTRAMUSCULAR | Status: AC | PRN
  Administered 2023-10-24: 40 mg via INTRA_ARTICULAR

## 2023-10-24 MED ORDER — BUPIVACAINE HCL 0.5 % IJ SOLN
2.0000 mL | INTRAMUSCULAR | Status: AC | PRN
Start: 2023-10-24 — End: 2023-10-24
  Administered 2023-10-24: 2 mL via INTRA_ARTICULAR

## 2023-10-24 NOTE — Progress Notes (Addendum)
 Office Visit Note   Patient: Jill Franco           Date of Birth: [birth year]           MRN: [id number] Visit Date: 10/24/2023              Requested by: Cleatus Arlyss RAMAN, MD [address] [place],  [place] [address] PCP: Cleatus Arlyss RAMAN, MD   Assessment & Plan: Visit Diagnoses:  1. Chronic pain of both knees     Plan: History of Present Illness GIAMARIE BUECHE is an 89 year old female with COPD who presents with chronic right knee pain. She is accompanied by her daughter, Romero.  She experiences constant right knee pain that has worsened over the past two years, affecting her ability to walk and often requiring her to sit down. The pain occasionally radiates upwards from the knee.   Exam of the right shows a trace effusion.  Normal range of motion.  No joint line tenderness.  Results RADIOLOGY Right knee X-ray: Osteoarthritis and chondrocalcinosis  Assessment and Plan Right knee pain due to osteoarthritis and pseudogout Chronic right knee pain due to arthritis and pseudogout with crystal deposition causing pain. - Administer cortisone injection to the right knee.  Follow-Up Instructions: No follow-ups on file.   Orders:  Orders Placed This Encounter  Procedures  . Large Joint Inj: R knee  . XR KNEE 3 VIEW LEFT  . XR KNEE 3 VIEW RIGHT   Meds ordered this encounter  Medications  . bupivacaine  (MARCAINE ) 0.5 % (with pres) injection 2 mL  . lidocaine  (XYLOCAINE ) 1 % (with pres) injection 2 mL  . methylPREDNISolone  acetate (DEPO-MEDROL ) injection 40 mg      Procedures: Large Joint Inj: R knee on 10/24/2023 11:00 AM Indications: pain Details: 22 G needle  Arthrogram: No  Medications: 40 mg methylPREDNISolone  acetate 40 MG/ML; 2 mL lidocaine  1 %; 2 mL bupivacaine  0.5 % Consent was given by the patient. Patient was prepped and draped in the usual sterile fashion.       Clinical Data: No additional findings.   Subjective: Chief Complaint  Patient  presents with  . Right Knee - Pain    HPI  Review of Systems  Constitutional: Negative.   HENT: Negative.    Eyes: Negative.   Respiratory: Negative.    Cardiovascular: Negative.   Endocrine: Negative.   Musculoskeletal: Negative.   Neurological: Negative.   Hematological: Negative.   Psychiatric/Behavioral: Negative.    All other systems reviewed and are negative.    Objective: Vital Signs: There were no vitals taken for this visit.  Physical Exam Vitals and nursing note reviewed.  Constitutional:      Appearance: She is well-developed.  HENT:     Head: Atraumatic.     Nose: Nose normal.  Eyes:     Extraocular Movements: Extraocular movements intact.  Cardiovascular:     Pulses: Normal pulses.  Pulmonary:     Effort: Pulmonary effort is normal.  Abdominal:     Palpations: Abdomen is soft.  Musculoskeletal:     Cervical back: Neck supple.  Skin:    General: Skin is warm.     Capillary Refill: Capillary refill takes less than 2 seconds.  Neurological:     Mental Status: She is alert. Mental status is at baseline.  Psychiatric:        Behavior: Behavior normal.        Thought Content: Thought content normal.  Judgment: Judgment normal.     Ortho Exam  Specialty Comments:  No specialty comments available.  Imaging: XR KNEE 3 VIEW RIGHT Result Date: 10/24/2023 X-rays of the right knee show mild arthritic changes with presence of chondrocalcinosis.  XR KNEE 3 VIEW LEFT Result Date: 10/24/2023 X-rays of the left knee show mild arthritic changes with presence of chondrocalcinosis.    PMFS History: Patient Active Problem List   Diagnosis Date Noted  . Ulcers of both lower extremities, limited to breakdown of skin (HCC) 04/28/2023  . COPD (chronic obstructive pulmonary disease) (HCC) 04/26/2023  . COPD exacerbation (HCC) 03/18/2023  . Decreased pedal pulses 03/17/2023  . HLD (hyperlipidemia) 09/28/2022  . Pancreatic insufficiency 11/09/2021  .  GERD without esophagitis 11/02/2021  . Nicotine  dependence, cigarettes, uncomplicated 11/02/2021  . Pain and swelling of right wrist 09/22/2021  . Constipation 09/22/2021  . Cough 08/18/2021  . Wheezing 06/09/2021  . Decreased GFR 06/09/2021  . Urinary urgency 06/09/2021  . Edema 03/28/2021  . Osteoporosis 10/11/2020  . Hyperglycemia 09/22/2020  . Elevated LFTs 06/16/2020  . Protein-calorie malnutrition, severe (HCC) 06/16/2020  . Anemia 09/12/2019  . Pulmonary nodule 04/17/2019  . Hyponatremia 04/17/2019  . Vertigo 04/17/2019  . Medicare annual wellness visit, subsequent 09/12/2018  . CVA (cerebral vascular accident) (HCC) 11/27/2017  . Vision loss 11/21/2017  . PAD (peripheral artery disease) (HCC) 03/18/2016  . Right leg pain 01/11/2016  . Back pain 01/03/2016  . Claudication (HCC) 01/03/2016  . Paresthesia 01/03/2016  . Hair loss 10/01/2015  . Advance care planning 08/04/2014  . Smoking 08/04/2014  . Healthcare maintenance 06/07/2011  . Hypothyroidism 08/23/2006  . HYPERTENSION, BENIGN ESSENTIAL 07/18/2006   Past Medical History:  Diagnosis Date  . Cataract    bilaterally corrected.  SABRA COPD (chronic obstructive pulmonary disease) (HCC)   . CVA (cerebral vascular accident) (HCC)    Pt was unaware and does not know when it happened.  Was told after some testing was done.  . Double vision   . Dyspnea   . Facial droop 10/2017   left  . History of shingles 2012  . HLD (hyperlipidemia)   . HTN (hypertension)   . Hypothyroidism   . PVD (peripheral vascular disease) (HCC)   . Vision loss    temporary    Family History  Problem Relation Age of Onset  . Stroke Father 23  . Cancer Brother   . Colon cancer Neg Hx   . Breast cancer Neg Hx   . Colon polyps Neg Hx   . Esophageal cancer Neg Hx   . Rectal cancer Neg Hx   . Stomach cancer Neg Hx     Past Surgical History:  Procedure Laterality Date  . ABDOMINAL AORTOGRAM N/A 04/30/2021   Procedure: ABDOMINAL  AORTOGRAM;  Surgeon: Magda Debby SAILOR, MD;  Location: [facility] INVASIVE CV LAB;  Service: Cardiovascular;  Laterality: N/A;  . ABDOMINAL EXPLORATION SURGERY  1950s   they thought I was pregnant; went in to explore  . APPENDECTOMY  childhood  . BACK SURGERY    . BREAST LUMPECTOMY Left 1960's   benign  . carotid ultrasound  06/19/2007   0-39% stenosis  . CATARACT EXTRACTION W/ INTRAOCULAR LENS IMPLANT Left 09/2016  . CATARACT EXTRACTION W/ INTRAOCULAR LENS IMPLANT Right 10/2016  . COLONOSCOPY WITH PROPOFOL  N/A 04/08/2019   Procedure: COLONOSCOPY WITH PROPOFOL ;  Surgeon: Teressa Toribio SQUIBB, MD;  Location: [facility] END[facility]COPY;  Service: Endoscopy;  Laterality: N/A;  . DILATION AND CURETTAGE OF UTERUS  1960's X 3   miscarriages  . DOPPLER ECHOCARDIOGRAPHY  06/19/2007   Normal EF 55-70%  . ENDARTERECTOMY FEMORAL Left 12/03/2020   Procedure: LEFT FEMORAL ENDARTERECTOMY AND PROFUNDAPLASTY;  Surgeon: Magda Debby SAILOR, MD;  Location: [facility];  Service: Vascular;  Laterality: Left;  . ILIAC ARTERY STENT Left 03/18/2016   common iliac stent (6 x 40), aortic stent (10 x 19)/notes 03/18/2016  . INSERTION OF ILIAC STENT Left 12/03/2020   Procedure: AORTIC STENTING, INSERTION OF BILATERAL COMMON ILIAC STENTS, AND LEFT EXTERNAL ILIAC STENT;  Surgeon: Magda Debby SAILOR, MD;  Location: [facility] OR;  Service: Vascular;  Laterality: Left;  . LAMINECTOMY AND MICRODISCECTOMY SPINE  1960's X 2  . LOWER EXTREMITY ANGIOGRAM N/A 12/03/2020   Procedure: REUBIN BILE;  Surgeon: Magda Debby SAILOR, MD;  Location: [facility] OR;  Service: Vascular;  Laterality: N/A;  . MOLE REMOVAL  07/10/2016  . NSVD     x1  . PATCH ANGIOPLASTY Left 12/03/2020   Procedure: PATCH ANGIOPLASTY OF LEFT COMMON FEMORAL ARTERY USING BOVINE PERICARDIUM PATCH;  Surgeon: Magda Debby SAILOR, MD;  Location: [facility] OR;  Service: Vascular;  Laterality: Left;  . PERIPHERAL VASCULAR BALLOON ANGIOPLASTY Right 04/30/2021   Procedure: PERIPHERAL VASCULAR BALLOON ANGIOPLASTY;   Surgeon: Magda Debby SAILOR, MD;  Location: [facility] INVASIVE CV LAB;  Service: Cardiovascular;  Laterality: Right;  rt common iliac  . PERIPHERAL VASCULAR CATHETERIZATION N/A 03/18/2016   Procedure: Abdominal Aortogram w/Lower Extremity;  Surgeon: Carlin FORBES Haddock, MD;  Location: [facility] INVASIVE CV LAB;  Service: Cardiovascular;  Laterality: N/A;  . PERIPHERAL VASCULAR CATHETERIZATION  03/18/2016   Procedure: Peripheral Vascular Intervention;  Surgeon: Carlin FORBES Haddock, MD;  Location: [facility] INVASIVE CV LAB;  Service: Cardiovascular;;  . PERIPHERAL VASCULAR INTERVENTION Left 04/30/2021   Procedure: PERIPHERAL VASCULAR INTERVENTION;  Surgeon: Magda Debby SAILOR, MD;  Location: [facility] INVASIVE CV LAB;  Service: Cardiovascular;  Laterality: Left;  lt common iliac  . POLYPECTOMY  04/08/2019   Procedure: POLYPECTOMY;  Surgeon: Teressa Toribio SQUIBB, MD;  Location: [facility];  Service: Endoscopy;;  . ULTRASOUND GUIDANCE FOR VASCULAR ACCESS Right 12/03/2020   Procedure: ULTRASOUND GUIDANCE FOR VASCULAR ACCESS, RIGHT FEMORAL ARTERY;  Surgeon: Magda Debby SAILOR, MD;  Location: [facility] OR;  Service: Vascular;  Laterality: Right;   Social History   Occupational History  . 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 Smoking status: Former    Current packs/day: 0.00    Average packs/day: 0.3 packs/day for 69.0 years (17.3 ttl pk-yrs)    Types: Cigarettes    Start date: 01/1952    Quit date: 01/2021    Years since quitting: 2.7    Passive exposure: Current  . Smokeless tobacco: Never  Vaping Use  . Vaping status: Never Used  Substance and Sexual Activity  . Alcohol use: No    Alcohol/week: 0.0 standard drinks of alcohol  . Drug use: No  . Sexual activity: Not on file

## 2023-10-24 NOTE — Telephone Encounter (Signed)
 Copied from CRM #[id number]. Topic: Clinical - Medication Question >> Oct 24, 2023  1:51 PM Jill Franco wrote: Reason for CRM: Patient stated prescription pantoprazole  (PROTONIX ) 40 MG tablet does not help with heartburn, took 3 yesterday and still did not help. Would like to know if Dr. Cleatus may send something else in. please call [phone]

## 2023-10-25 MED ORDER — SUCRALFATE 1 G PO TABS: 1.0000 g | ORAL_TABLET | Freq: Three times a day (TID) | ORAL | 0 refills | Status: DC

## 2023-10-25 NOTE — Addendum Note (Signed)
 Addended by: CLEATUS ARLYSS RAMAN on: 10/25/2023 04:03 PM   Modules accepted: Orders

## 2023-10-25 NOTE — Telephone Encounter (Signed)
 I would try taking sucralfate  4 times daily.  I would hold Fosamax /alendronate  in the meantime.  If that is not helping then I think she needs to be checked in clinic to see about options.  Thanks.

## 2023-10-26 NOTE — Telephone Encounter (Signed)
 Left voicemail for patient to return call to office.

## 2023-10-26 NOTE — Telephone Encounter (Unsigned)
 Copied from CRM [id number]. Topic: General - Other >> Oct 26, 2023 11:47 AM Jasmin G wrote: Reason for CRM: Pt called regarding recent missed phone call from Ms. Trudy Davene CROME, CMA with a message to call back, I relayed info left by Dr. Cleatus, but pt requested a phone call back because she wanted to see if she could take a med that only has to be taken once per day, please call her back at [phone] to discuss.

## 2023-10-26 NOTE — Telephone Encounter (Signed)
 I don't have a great option for once a day dosing given her situation.  I thought about that that and sucralfate  was the best option I could think of.

## 2023-10-26 NOTE — Telephone Encounter (Signed)
 Patient wants to know is there a medication that she can take she only has to take once a day

## 2023-10-27 NOTE — Telephone Encounter (Signed)
 Patient advised of Dr. Verdene notes. That the medication sent is the best option. She says since she stopped drinking an orange drink she hasn't had much of a problem with heartburn. She thanked me for the call.

## 2023-11-16 ENCOUNTER — Ambulatory Visit

## 2023-11-16 ENCOUNTER — Ambulatory Visit (INDEPENDENT_AMBULATORY_CARE_PROVIDER_SITE_OTHER)

## 2023-11-16 DIAGNOSIS — Z23 Encounter for immunization: Secondary | ICD-10-CM

## 2023-11-30 ENCOUNTER — Other Ambulatory Visit: Payer: Self-pay

## 2023-11-30 DIAGNOSIS — I7409 Other arterial embolism and thrombosis of abdominal aorta: Secondary | ICD-10-CM

## 2023-11-30 DIAGNOSIS — I739 Peripheral vascular disease, unspecified: Secondary | ICD-10-CM

## 2023-12-07 ENCOUNTER — Other Ambulatory Visit: Payer: Self-pay | Admitting: Family Medicine

## 2023-12-07 DIAGNOSIS — J449 Chronic obstructive pulmonary disease, unspecified: Secondary | ICD-10-CM

## 2023-12-08 NOTE — Telephone Encounter (Signed)
 Symbicort  inh Last filled:  10/06/23, #30.6 g Last OV:  06/01/23, foot swelling Next OV:  none

## 2023-12-09 ENCOUNTER — Other Ambulatory Visit: Payer: Self-pay | Admitting: Family Medicine

## 2023-12-16 ENCOUNTER — Other Ambulatory Visit: Payer: Self-pay | Admitting: Family Medicine

## 2023-12-16 DIAGNOSIS — I739 Peripheral vascular disease, unspecified: Secondary | ICD-10-CM

## 2023-12-19 NOTE — Telephone Encounter (Signed)
 Sent. Thanks.

## 2023-12-19 NOTE — Telephone Encounter (Signed)
 Xarelto  Last filled:  10/08/23, #200 Last OV:  06/01/23, foot swelling Next OV:  none

## 2023-12-25 ENCOUNTER — Encounter: Payer: Self-pay | Admitting: Radiology

## 2023-12-25 NOTE — Progress Notes (Unsigned)
 [facility] [place]  ASSESSMENT / PLAN: Jill Franco is a 89 y.o. female with atherosclerosis of native and stented aortoiliac occlusive disease as well as bilateral lower extremity arteries initially causing causing disabling claudication. Now status post left femoral endarterectomy and profundaplasty, complete endovascular reconstruction of aortic bifurcation (CERAB) 12/03/20. This required endovascular revision 04/30/21 for mechanical compression of the iliac stenting.   Recommend the following which can slow the progression of atherosclerosis and reduce the risk of major adverse cardiac / limb events:  Complete cessation from all tobacco products. Blood glucose control with goal A1c < 7%. Blood pressure control with goal blood pressure < 140/90 mmHg. Lipid reduction therapy with goal LDL-C <100 mg/dL (<29 if symptomatic from PAD).  Aspirin  81mg  PO QD.  Atorvastatin  40-80mg  PO QD (or other high intensity statin therapy).  Patient desires intervention for bilateral lower extremity disabling claudication.  Will check a CT angiogram with runoff to the toes to evaluate her surgical anatomy and see if femoral-popliteal bypass grafting is possible.  Prior to this, she will need a cardiology evaluation for risk stratification.  CHIEF COMPLAINT: Calf pain with walking  HISTORY OF PRESENT ILLNESS: Jill Franco is a 89 y.o. female who returns to clinic for surveillance of peripheral arterial disease.  She reports deteriorating claudication symptoms.  She is now disabled by her symptoms.  She can only walk a short distance before severe pain sets in bilateral calves.  She has no wounds about her feet.  She occasionally has pain in her foot at rest, but does not report symptoms typical of ischemic rest pain.  She continues to smoke, but is working hard to quit.  She is using patches with some success.  10/27/20: patient returns after evaluation by Dr. Ladona. Pending stress  test and echo. No interval changes.  01/11/21: Patient returns after hybrid revascularization.  She reports complete symptomatic relief of claudication.  She is walking as far as she likes.  She is appreciative.   04/20/21: Patient returns for follow-up evaluation.  She has had return of severe symptoms in her bilateral lower extremities, left worse than right.  She endorses early rest pain symptoms in her left foot.  She has short distance, disabling claudication in both calves.  06/08/21: Patient returns to clinic.  After intervention, the patient had early relief of her symptoms, but they have now returned.  We had a long discussion about our options going forward.  She is maximally medically managed for peripheral arterial disease.  Her symptoms are not yet disabling to her.  09/07/21: patient returns to clinic and reports his symptoms have improved. She has started on Xarelto . She attributes improvement in her symptoms to Xarelto .   09/26/23: Patient presents to clinic to review CT angiogram results.  In brief, she had been under surveillance with our group's physician assistants, duplex noted left external iliac artery stenosis versus in-stent stenosis.  CT angiogram was performed.  This does show some mild stenosis in the left external iliac stent as well as in the distal left external iliac artery which is unstented.  I reviewed these results with her in detail.  I counseled her that endovascular intervention would require a general anesthetic and left brachial artery access.  She does have some claudication type symptoms, but not enough in her mind to warrant reintervention.  I think this is reasonable given her advanced age and the requirement for general anesthetic for intervention.  We ultimately agreed to monitor her  with close serial follow-up.  12/26/23: Patient returns to clinic for follow-up.  She is highly symptomatic in both legs with claudication symptoms.  I counseled her that her stenting  appears patent, and she would likely need infrainguinal bypass to address her symptoms completely.  This would put her at some risk given her advanced age and advanced atherosclerosis.  I suggested that we start with a CT angiogram to see what her surgical anatomy might entail and to make a more definite plan for intervention if this is how she would like to proceed.  VASCULAR SURGICAL HISTORY:  03/18/16 -- left common iliac stent (6 x 40), terminal aortic stent (10 x 19)  (Fields)  12/03/20 --  1) US  guided right common femoral artery access 2) Aortogram and pelvic angiogram 3) Terminal aortic stenting (11x55mm VBX post dilated to ~9mm) 4) Right common iliac artery stenting (7x67mm VBX post dilated to 9mm proximally) 5) Left common iliac artery stenting (7x26mm VBX post dilated to 9mm proximally) 6) left external iliac artery stenting (7x1mm viabahn) 7) left femoral endarterectomy, profundaplasty, and patch angioplasty (bovine pericardium)  04/30/21 -- Angioplasty of bilateral common iliac artery stents (7x1mm Mustang) Left external iliac artery stenting (7x65mm Eluvia)  VASCULAR RISK FACTORS: Positive history of stroke / transient ischemic attack. Negative history of coronary artery disease.  Negative history of diabetes mellitus. Last A1c 5.7. Positive history of smoking. Not actively smoking. Positive history of hypertension.  No reported history of chronic kidney disease but last GFR 58-73. CKD stage 2 - 3a. Negative history of chronic obstructive pulmonary disease, but patient has been treated with inhalers with symptomatic relief.   FUNCTIONAL STATUS: ECOG performance status: (1) Restricted in physically strenuous activity, ambulatory and able to do work of light nature Ambulatory status: Ambulatory within the community with limits  Past Medical History:  Diagnosis Date   Cataract    bilaterally corrected.   COPD (chronic obstructive pulmonary disease) (HCC)    CVA  (cerebral vascular accident) (HCC)    Pt was unaware and does not know when it happened.  Was told after some testing was done.   Double vision    Dyspnea    Facial droop 10/2017   left   History of shingles 2012   HLD (hyperlipidemia)    HTN (hypertension)    Hypothyroidism    PVD (peripheral vascular disease)    Vision loss    temporary    Past Surgical History:  Procedure Laterality Date   ABDOMINAL AORTOGRAM N/A 04/30/2021   Procedure: ABDOMINAL AORTOGRAM;  Surgeon: Magda Debby SAILOR, MD;  Location: [facility];  Service: Cardiovascular;  Laterality: N/A;   ABDOMINAL EXPLORATION SURGERY  1950s   they thought I was pregnant; went in to explore   APPENDECTOMY  childhood   BACK SURGERY     BREAST LUMPECTOMY Left 1960's   benign   carotid ultrasound  06/19/2007   0-39% stenosis   CATARACT EXTRACTION W/ INTRAOCULAR LENS IMPLANT Left 09/2016   CATARACT EXTRACTION W/ INTRAOCULAR LENS IMPLANT Right 10/2016   COLONOSCOPY WITH PROPOFOL  N/A 04/08/2019   Procedure: COLONOSCOPY WITH PROPOFOL ;  Surgeon: Teressa Toribio SQUIBB, MD;  Location: [facility];  Service: Endoscopy;  Laterality: N/A;   DILATION AND CURETTAGE OF UTERUS  1960's X 3   miscarriages   DOPPLER ECHOCARDIOGRAPHY  06/19/2007   Normal EF 55-70%   ENDARTERECTOMY FEMORAL Left 12/03/2020   Procedure: LEFT FEMORAL ENDARTERECTOMY AND PROFUNDAPLASTY;  Surgeon: Magda Debby SAILOR, MD;  Location: [facility];  Service: Vascular;  Laterality: Left;   ILIAC ARTERY STENT Left 03/18/2016   common iliac stent (6 x 40), aortic stent (10 x 19)/notes 03/18/2016   INSERTION OF ILIAC STENT Left 12/03/2020   Procedure: AORTIC STENTING, INSERTION OF BILATERAL COMMON ILIAC STENTS, AND LEFT EXTERNAL ILIAC STENT;  Surgeon: Magda Debby SAILOR, MD;  Location: [facility];  Service: Vascular;  Laterality: Left;   LAMINECTOMY AND MICRODISCECTOMY SPINE  1960's X 2   LOWER EXTREMITY ANGIOGRAM N/A 12/03/2020   Procedure: REUBIN BILE;  Surgeon: Magda Debby SAILOR, MD;  Location: [facility];  Service: Vascular;  Laterality: N/A;   MOLE REMOVAL  07/10/2016   NSVD     x1   PATCH ANGIOPLASTY Left 12/03/2020   Procedure: PATCH ANGIOPLASTY OF LEFT COMMON FEMORAL ARTERY USING BOVINE PERICARDIUM PATCH;  Surgeon: Magda Debby SAILOR, MD;  Location: [facility];  Service: Vascular;  Laterality: Left;   PERIPHERAL VASCULAR BALLOON ANGIOPLASTY Right 04/30/2021   Procedure: PERIPHERAL VASCULAR BALLOON ANGIOPLASTY;  Surgeon: Magda Debby SAILOR, MD;  Location: [facility];  Service: Cardiovascular;  Laterality: Right;  rt common iliac   PERIPHERAL VASCULAR CATHETERIZATION N/A 03/18/2016   Procedure: Abdominal Aortogram w/Lower Extremity;  Surgeon: Carlin FORBES Haddock, MD;  Location: [facility] CV LAB;  Service: Cardiovascular;  Laterality: N/A;   PERIPHERAL VASCULAR CATHETERIZATION  03/18/2016   Procedure: Peripheral Vascular Intervention;  Surgeon: Carlin FORBES Haddock, MD;  Location: [facility] INVASIVE CV LAB;  Service: Cardiovascular;;   PERIPHERAL VASCULAR INTERVENTION Left 04/30/2021   Procedure: PERIPHERAL VASCULAR INTERVENTION;  Surgeon: Magda Debby SAILOR, MD;  Location: [facility];  Service: Cardiovascular;  Laterality: Left;  lt common iliac   POLYPECTOMY  04/08/2019   Procedure: POLYPECTOMY;  Surgeon: Teressa Toribio SQUIBB, MD;  Location: [facility];  Service: Endoscopy;;   ULTRASOUND GUIDANCE FOR VASCULAR ACCESS Right 12/03/2020   Procedure: ULTRASOUND GUIDANCE FOR VASCULAR ACCESS, RIGHT FEMORAL ARTERY;  Surgeon: Magda Debby SAILOR, MD;  Location: [facility];  Service: Vascular;  Laterality: Right;    Family History  Problem Relation Age of Onset   Stroke Father 78   Cancer Brother    Colon cancer Neg Hx    Breast cancer Neg Hx    Colon polyps Neg Hx    Esophageal cancer Neg Hx    Rectal cancer Neg Hx    Stomach cancer Neg Hx     Social History   Socioeconomic History   Marital status: Widowed    Spouse name: Not on file   Number of children: 1   Years of  education: Not on file   Highest education level: Not on file  Occupational History   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  Tobacco Use   Smoking status: Former    Current packs/day: 0.00    Average packs/day: 0.3 packs/day for 69.0 years (17.3 ttl pk-yrs)    Types: Cigarettes    Start date: 01/1952    Quit date: 01/2021    Years since quitting: 2.9    Passive exposure: Current   Smokeless tobacco: Never  Vaping Use   Vaping status: Never Used  Substance and Sexual Activity   Alcohol use: No    Alcohol/week: 0.0 standard drinks of alcohol   Drug use: No   Sexual activity: Not on file  Other Topics Concern   Not on file  Social History Narrative   From [place]   Retired 2007   Widowed 2008 after 49 years   Enjoys  yard work, but needs some help with yard work now.     Her daughter is helping at home some (some as of 2022).  Her daughter has vision loss and is widowed as of 2022 and moved in with patient.   Social Drivers of Corporate Investment Banker Strain: Low Risk  (07/19/2023)   Overall Financial Resource Strain (CARDIA)    Difficulty of Paying Living Expenses: Not hard at all  Food Insecurity: No Food Insecurity (07/19/2023)   Hunger Vital Sign    Worried About Running Out of Food in the Last Year: Never true    Ran Out of Food in the Last Year: Never true  Transportation Needs: No Transportation Needs (07/19/2023)   PRAPARE - Administrator, Civil Service (Medical): No    Lack of Transportation (Non-Medical): No  Physical Activity: Insufficiently Active (07/19/2023)   Exercise Vital Sign    Days of Exercise per Week: 3 days    Minutes of Exercise per Session: 30 min  Stress: No Stress Concern Present (07/19/2023)   [employer] of Occupational Health - Occupational Stress Questionnaire    Feeling of Stress : Not at all  Social Connections: Socially Isolated (07/19/2023)   Social Connection and Isolation Panel    Frequency of  Communication with Friends and Family: More than three times a week    Frequency of Social Gatherings with Friends and Family: More than three times a week    Attends Religious Services: Never    Database Administrator or Organizations: No    Attends Banker Meetings: Never    Marital Status: Widowed  Intimate Partner Violence: Not At Risk (07/19/2023)   Humiliation, Afraid, Rape, and Kick questionnaire    Fear of Current or Ex-Partner: No    Emotionally Abused: No    Physically Abused: No    Sexually Abused: No    Allergies  Allergen Reactions   Lipitor [Atorvastatin ] Other (See Comments)    myalgia   Advil [Ibuprofen] Nausea And Vomiting   Aspirin  Other (See Comments)    Uncoated aspirin  causes stomach upset on empty stomach   Codeine Nausea Only and Other (See Comments)    Pill needs to be E.coated   Other Itching and Rash    Nicotine  patches     Current Outpatient Medications  Medication Sig Dispense Refill   acetaminophen  (TYLENOL ) 650 MG CR tablet Take 650 mg by mouth every 8 (eight) hours as needed for pain.     albuterol  (VENTOLIN  HFA) 108 (90 Base) MCG/ACT inhaler USE 2 INHALATIONS BY MOUTH EVERY 6 HOURS AS NEEDED FOR WHEEZING  OR SHORTNESS OF BREATH 34 g 2   alendronate  (FOSAMAX ) 70 MG tablet TAKE 1 TABLET BY MOUTH WEEKLY  WITH 8 OZ OF PLAIN WATER 30  MINUTES BEFORE FIRST FOOD, DRINK OR MEDS. STAY UPRIGHT FOR 30  MINS 12 tablet 3   aspirin  EC 81 MG tablet Take 1 tablet (81 mg total) by mouth daily. Swallow whole. 90 tablet 3   Cholecalciferol  (VITAMIN D3) 25 MCG (1000 UT) CAPS Take 1 capsule (1,000 Units total) by mouth daily.     diclofenac  Sodium (VOLTAREN ) 1 % GEL Apply 2 g topically 4 (four) times daily. 50 g 0   furosemide  (LASIX ) 20 MG tablet Take 2 tablets (40 mg total) by mouth daily.     ipratropium-albuterol  (DUONEB) 0.5-2.5 (3) MG/3ML SOLN Take 3 mLs by nebulization every 6 (six) hours as needed. 360 mL 1  levothyroxine  (SYNTHROID ) 50 MCG tablet  TAKE 1 TABLET BY MOUTH DAILY  EXCEPT 2 TABLETS ON SUNDAYS AND  WEDNESDAYS ( TOTAL 9 TABS PER  WEEK) 130 tablet 2   melatonin 5 MG TABS Take 5 mg by mouth at bedtime as needed (For sleep).     methocarbamol  (ROBAXIN ) 500 MG tablet TAKE 1 TABLET BY MOUTH TWICE DAILY AS NEEDED FOR MUSCLE SPASM 60 tablet 0   nicotine  (NICODERM CQ  - DOSED IN MG/24 HOURS) 14 mg/24hr patch Place 14 mg onto the skin daily.     pantoprazole  (PROTONIX ) 40 MG tablet Take 1 tablet by mouth twice daily 60 tablet 0   rivaroxaban  (XARELTO ) 2.5 MG TABS tablet TAKE 1 TABLET BY MOUTH TWICE  DAILY 200 tablet 1   rosuvastatin  (CRESTOR ) 20 MG tablet TAKE 1 TABLET BY MOUTH DAILY 100 tablet 1   SPIRIVA  HANDIHALER 18 MCG inhalation capsule INHALE THE CONTENTS OF 1 CAPSULE BY MOUTH VIA INHALATION DEVICE  DAILY 90 capsule 3   sucralfate  (CARAFATE ) 1 g tablet Take 1 tablet (1 g total) by mouth 4 (four) times daily -  with meals and at bedtime. 40 tablet 0   SYMBICORT  160-4.5 MCG/ACT inhaler USE 2 INHALATIONS BY MOUTH IN  THE MORNING AND AT BEDTIME .  RINSE AFTER USE 30.6 g 3   No current facility-administered medications for this visit.    PHYSICAL EXAM There were no vitals filed for this visit.  Elderly woman in no distress Regular rate and rhythm Unlabored breathing Palpable femoral pulses No palpable pedal pulses  PERTINENT LABORATORY AND RADIOLOGIC DATA  Most recent CBC    Latest Ref Rng & Units 04/24/2023   11:25 AM 03/27/2023    8:45 AM 03/22/2023    4:47 AM  CBC  WBC 4.0 - 10.5 K/uL 7.1  11.9  10.0   Hemoglobin 12.0 - 15.0 g/dL 85.9  83.2  84.1   Hematocrit 36.0 - 46.0 % 42.1  50.4  46.8   Platelets 150.0 - 400.0 K/uL 331.0  196.0  150      Most recent CMP    Latest Ref Rng & Units 05/16/2023    9:48 AM 04/24/2023   11:25 AM 03/27/2023    8:45 AM  CMP  Glucose 70 - 99 mg/dL 92  97  97   BUN 6 - 23 mg/dL 17  22  21    Creatinine 0.40 - 1.20 mg/dL 9.28  9.36  9.15   Sodium 135 - 145 mEq/L 138  141  139   Potassium  3.5 - 5.1 mEq/L 4.0  4.7  3.3   Chloride 96 - 112 mEq/L 99  101  99   CO2 19 - 32 mEq/L 31  32  30   Calcium  8.4 - 10.5 mg/dL 9.2  9.9  9.1   Total Protein 6.0 - 8.3 g/dL  6.2  5.9   Total Bilirubin 0.2 - 1.2 mg/dL  0.5  1.0   Alkaline Phos 39 - 117 U/L  65  69   AST 0 - 37 U/L  19  29   ALT 0 - 35 U/L  17  49     Renal function CrCl cannot be calculated (Patient's most recent lab result is older than the maximum 21 days allowed.).  Hgb A1c MFr Bld (%)  Date Value  09/18/2020 5.7    LDL Cholesterol  Date Value Ref Range Status  11/21/2022 64 0 - 99 mg/dL Final   Direct LDL  Date Value  Ref Range Status  06/19/2012 115.8 mg/dL Final    Comment:    Optimal:  <100 mg/dLNear or Above Optimal:  100-129 mg/dLBorderline High:  130-159 mg/dLHigh:  160-189 mg/dLVery High:  >190 mg/dL     CT angiogram Aortic and bilateral common iliac artery stenting widely patent Left external iliac stenting widely patent There is some stenosis in the distal left external iliac artery   Debby SAILOR. Magda, MD [facility] Phone Number: [phone] 12/25/2023 4:45 PM

## 2023-12-26 ENCOUNTER — Ambulatory Visit (HOSPITAL_COMMUNITY)
Admission: RE | Admit: 2023-12-26 | Discharge: 2023-12-26 | Disposition: A | Discharge status: Home / Self Care | Source: Ambulatory Visit | Attending: Vascular Surgery | Admitting: Vascular Surgery

## 2023-12-26 ENCOUNTER — Ambulatory Visit: Attending: Vascular Surgery | Admitting: Vascular Surgery

## 2023-12-26 ENCOUNTER — Ambulatory Visit (HOSPITAL_BASED_OUTPATIENT_CLINIC_OR_DEPARTMENT_OTHER)
Admission: RE | Admit: 2023-12-26 | Discharge: 2023-12-26 | Disposition: A | Discharge status: Home / Self Care | Source: Ambulatory Visit | Attending: Vascular Surgery | Admitting: Vascular Surgery

## 2023-12-26 ENCOUNTER — Encounter: Payer: Self-pay | Admitting: Vascular Surgery

## 2023-12-26 VITALS — BP 217/84 | HR 73 | Temp 98.2°F | Ht 64.0 in | Wt 113.0 lb

## 2023-12-26 DIAGNOSIS — I739 Peripheral vascular disease, unspecified: Secondary | ICD-10-CM

## 2023-12-26 DIAGNOSIS — I7409 Other arterial embolism and thrombosis of abdominal aorta: Secondary | ICD-10-CM | POA: Insufficient documentation

## 2023-12-26 DIAGNOSIS — I70213 Atherosclerosis of native arteries of extremities with intermittent claudication, bilateral legs: Secondary | ICD-10-CM | POA: Diagnosis not present

## 2023-12-26 LAB — VAS US ABI WITH/WO TBI
Left ABI: 0.6
Right ABI: 0.69

## 2023-12-27 ENCOUNTER — Other Ambulatory Visit: Payer: Self-pay

## 2023-12-27 DIAGNOSIS — I70213 Atherosclerosis of native arteries of extremities with intermittent claudication, bilateral legs: Secondary | ICD-10-CM

## 2024-01-01 ENCOUNTER — Other Ambulatory Visit: Payer: Self-pay

## 2024-01-04 ENCOUNTER — Telehealth: Payer: Self-pay

## 2024-01-04 ENCOUNTER — Ambulatory Visit (INDEPENDENT_AMBULATORY_CARE_PROVIDER_SITE_OTHER): Admitting: Family Medicine

## 2024-01-04 ENCOUNTER — Other Ambulatory Visit: Payer: Self-pay | Admitting: Family Medicine

## 2024-01-04 VITALS — BP 170/82 | HR 68 | Temp 97.7°F | Ht 64.0 in | Wt 114.0 lb

## 2024-01-04 DIAGNOSIS — I10 Essential (primary) hypertension: Secondary | ICD-10-CM

## 2024-01-04 DIAGNOSIS — Z23 Encounter for immunization: Secondary | ICD-10-CM | POA: Diagnosis not present

## 2024-01-04 MED ORDER — ENALAPRIL MALEATE 10 MG PO TABS: 10.0000 mg | ORAL_TABLET | Freq: Every day | ORAL | 11 refills | Status: DC

## 2024-01-04 NOTE — Progress Notes (Signed)
 Patient ID: Jill Franco, female    DOB: [birth year], 89 y.o.   MRN: [id number]  This visit was conducted in person.  BP (!) 170/82   Temp 97.7 F (36.5 C) (Oral)   Ht 5' 4 (1.626 m)   Wt 114 lb (51.7 kg)   BMI 19.57 kg/m    CC:  Chief Complaint  Patient presents with   Hypertension    Has been going on for last 2-3 months.     Subjective:   HPI: Jill Franco is a 89 y.o. female presenting on 01/04/2024 for Hypertension (Has been going on for last 2-3 months. ) PCP Jill Franco  Daughter  Jill Franco   She has noted  several weeks of elevated blood pressure no H/A,dizziness,CP, vision changes and no more SOB than usual (pt has COPD)  History of HTN  Appears she was previously on enalapril  hydrochlorothiazide  and metoprolol .  Daughter was feeling these were stopped given BP dropping to ow.. dizziness.  Now only on lasix  using as needed... tolerable swelling.  At home 160-191/76-90   No weight change, limit salt  and caffeine. BP Readings from Last 3 Encounters:  01/04/24 (!) 170/82  12/26/23 (!) 217/84  09/26/23 (!) 186/74    Reivewed last Vascular note 12/26/2023 has highly symptomatic PAD Reviewed last PCP OV 05/2023 recs: stenting appears patent, and she would likely need infrainguinal bypass to address her symptoms completely.   Relevant past medical, surgical, family and social history reviewed and updated as indicated. Interim medical history since our last visit reviewed. Allergies and medications reviewed and updated. Outpatient Medications Prior to Visit  Medication Sig Dispense Refill   albuterol  (VENTOLIN  HFA) 108 (90 Base) MCG/ACT inhaler USE 2 INHALATIONS BY MOUTH EVERY 6 HOURS AS NEEDED FOR WHEEZING  OR SHORTNESS OF BREATH 34 g 2   aspirin  EC 81 MG tablet Take 1 tablet (81 mg total) by mouth daily. Swallow whole. 90 tablet 3   Cholecalciferol  (VITAMIN D3) 25 MCG (1000 UT) CAPS Take 1 capsule (1,000 Units total) by mouth daily.     diclofenac  Sodium  (VOLTAREN ) 1 % GEL Apply 2 g topically 4 (four) times daily. 50 g 0   furosemide  (LASIX ) 20 MG tablet Take 2 tablets (40 mg total) by mouth daily.     ipratropium-albuterol  (DUONEB) 0.5-2.5 (3) MG/3ML SOLN Take 3 mLs by nebulization every 6 (six) hours as needed. 360 mL 1   levothyroxine  (SYNTHROID ) 50 MCG tablet TAKE 1 TABLET BY MOUTH DAILY  EXCEPT 2 TABLETS ON SUNDAYS AND  WEDNESDAYS ( TOTAL 9 TABS PER  WEEK) 130 tablet 2   melatonin 5 MG TABS Take 5 mg by mouth at bedtime as needed (For sleep).     methocarbamol  (ROBAXIN ) 500 MG tablet TAKE 1 TABLET BY MOUTH TWICE DAILY AS NEEDED FOR MUSCLE SPASM 60 tablet 0   nicotine  (NICODERM CQ  - DOSED IN MG/24 HOURS) 14 mg/24hr patch Place 14 mg onto the skin daily.     pantoprazole  (PROTONIX ) 40 MG tablet Take 1 tablet by mouth twice daily 60 tablet 0   rivaroxaban  (XARELTO ) 2.5 MG TABS tablet TAKE 1 TABLET BY MOUTH TWICE  DAILY 200 tablet 1   rosuvastatin  (CRESTOR ) 20 MG tablet TAKE 1 TABLET BY MOUTH DAILY 100 tablet 1   SPIRIVA  HANDIHALER 18 MCG inhalation capsule INHALE THE CONTENTS OF 1 CAPSULE BY MOUTH VIA INHALATION DEVICE  DAILY 90 capsule 3   sucralfate  (CARAFATE ) 1 g tablet Take 1 tablet (1 g total)  by mouth 4 (four) times daily -  with meals and at bedtime. 40 tablet 0   SYMBICORT  160-4.5 MCG/ACT inhaler USE 2 INHALATIONS BY MOUTH IN  THE MORNING AND AT BEDTIME .  RINSE AFTER USE 30.6 g 3   acetaminophen  (TYLENOL ) 650 MG CR tablet Take 650 mg by mouth every 8 (eight) hours as needed for pain.     alendronate  (FOSAMAX ) 70 MG tablet TAKE 1 TABLET BY MOUTH WEEKLY  WITH 8 OZ OF PLAIN WATER 30  MINUTES BEFORE FIRST FOOD, DRINK OR MEDS. STAY UPRIGHT FOR 30  MINS 12 tablet 3   No facility-administered medications prior to visit.     Per HPI unless specifically indicated in ROS section below Review of Systems  Constitutional:  Negative for fatigue and fever.  HENT:  Negative for congestion.   Eyes:  Negative for pain.  Respiratory:  Negative for  cough and shortness of breath.   Cardiovascular:  Negative for chest pain, palpitations and leg swelling.  Gastrointestinal:  Negative for abdominal pain.  Genitourinary:  Negative for dysuria and vaginal bleeding.  Musculoskeletal:  Negative for back pain.  Neurological:  Negative for syncope, light-headedness and headaches.  Psychiatric/Behavioral:  Negative for dysphoric mood.    Objective:  BP (!) 170/82   Temp 97.7 F (36.5 C) (Oral)   Ht 5' 4 (1.626 m)   Wt 114 lb (51.7 kg)   BMI 19.57 kg/m   Wt Readings from Last 3 Encounters:  01/04/24 114 lb (51.7 kg)  12/26/23 113 lb (51.3 kg)  09/26/23 111 lb 14.4 oz (50.8 kg)      Physical Exam Constitutional:      General: She is not in acute distress.    Appearance: Normal appearance. She is well-developed. She is not ill-appearing or toxic-appearing.  HENT:     Head: Normocephalic.     Right Ear: Hearing, tympanic membrane, ear canal and external ear normal. Tympanic membrane is not erythematous, retracted or bulging.     Left Ear: Hearing, tympanic membrane, ear canal and external ear normal. Tympanic membrane is not erythematous, retracted or bulging.     Nose: No mucosal edema or rhinorrhea.     Right Sinus: No maxillary sinus tenderness or frontal sinus tenderness.     Left Sinus: No maxillary sinus tenderness or frontal sinus tenderness.     Mouth/Throat:     Pharynx: Uvula midline.  Eyes:     General: Lids are normal. Lids are everted, no foreign bodies appreciated.     Conjunctiva/sclera: Conjunctivae normal.     Pupils: Pupils are equal, round, and reactive to light.  Neck:     Thyroid : No thyroid  mass or thyromegaly.     Vascular: No carotid bruit.     Trachea: Trachea normal.  Cardiovascular:     Rate and Rhythm: Normal rate and regular rhythm.     Pulses: Normal pulses.     Heart sounds: Normal heart sounds, S1 normal and S2 normal. No murmur heard.    No friction rub. No gallop.  Pulmonary:     Effort:  Pulmonary effort is normal. No tachypnea or respiratory distress.     Breath sounds: Normal breath sounds. No decreased breath sounds, wheezing, rhonchi or rales.  Abdominal:     General: Bowel sounds are normal.     Palpations: Abdomen is soft.     Tenderness: There is no abdominal tenderness.  Musculoskeletal:     Cervical back: Normal range of motion and neck supple.  Skin:    General: Skin is warm and dry.     Findings: No rash.  Neurological:     Mental Status: She is alert.  Psychiatric:        Mood and Affect: Mood is not anxious or depressed.        Speech: Speech normal.        Behavior: Behavior normal. Behavior is cooperative.        Thought Content: Thought content normal.        Judgment: Judgment normal.       Results for orders placed or performed during the hospital encounter of 12/26/23  VAS US  ABI WITH/WO TBI   Collection Time: 12/26/23 10:13 AM  Result Value Ref Range   Right ABI 0.69    Left ABI 0.60     Assessment and Plan  Primary hypertension Assessment & Plan:  Chronic, with acute worsening On extensive chart review, it was noted that she was taken off enalapril , hydrochlorothiazide  and metoprolol  given hypotension. Blood pressure has likely just elevated over time.  Will evaluate with labs for new secondary cause of blood pressure elevation including complete metabolic panel and thyroid  testing. No evidence of fluid overload, continue daily use Lasix  as needed.  Will restart low-dose enalapril  10 mg daily.  Patient will follow blood pressure at home over the next 2 weeks.  She will follow-up with PCP or myself for blood pressure recheck.  Return and ER precautions provided.  Orders: -     CBC with Differential/Platelet -     Comprehensive metabolic panel with GFR -     TSH  Other orders -     Enalapril  Maleate; Take 1 tablet (10 mg total) by mouth daily.  Dispense: 30 tablet; Refill: 11    No follow-ups on file.   Greig Ring, MD

## 2024-01-04 NOTE — Telephone Encounter (Signed)
Noted, will see

## 2024-01-04 NOTE — Assessment & Plan Note (Addendum)
 Chronic, with acute worsening On extensive chart review, it was noted that she was taken off enalapril , hydrochlorothiazide  and metoprolol  given hypotension. Blood pressure has likely just elevated over time.  Will evaluate with labs for new secondary cause of blood pressure elevation including complete metabolic panel and thyroid  testing. No evidence of fluid overload, continue daily use Lasix  as needed.  Will restart low-dose enalapril  10 mg daily.  Patient will follow blood pressure at home over the next 2 weeks.  She will follow-up with PCP or myself for blood pressure recheck.  Return and ER precautions provided.

## 2024-01-04 NOTE — Telephone Encounter (Signed)
 For several weeks B P elevated; no H/A,dizziness,CP, vision changes and no more SOB than usual (pt has COPD). Pt scheduled appt 01/04/24 2 pm with Dr Avelina with UC & ED precautions. Pt has not missed taking BP med. Sending to Dr Avelina and [place] pool.

## 2024-01-05 ENCOUNTER — Ambulatory Visit: Payer: Self-pay | Admitting: Family Medicine

## 2024-01-05 LAB — COMPREHENSIVE METABOLIC PANEL WITH GFR
ALT: 13 U/L (ref 0–35)
AST: 17 U/L (ref 0–37)
Albumin: 4.1 g/dL (ref 3.5–5.2)
Alkaline Phosphatase: 67 U/L (ref 39–117)
BUN: 22 mg/dL (ref 6–23)
CO2: 27 meq/L (ref 19–32)
Calcium: 9.2 mg/dL (ref 8.4–10.5)
Chloride: 103 meq/L (ref 96–112)
Creatinine, Ser: 1.06 mg/dL (ref 0.40–1.20)
GFR: 47.2 mL/min — ABNORMAL LOW (ref >60.00)
Glucose, Bld: 84 mg/dL (ref 70–99)
Potassium: 4.4 meq/L (ref 3.5–5.1)
Sodium: 139 meq/L (ref 135–145)
Total Bilirubin: 0.5 mg/dL (ref 0.2–1.2)
Total Protein: 6.3 g/dL (ref 6.0–8.3)

## 2024-01-05 LAB — CBC WITH DIFFERENTIAL/PLATELET
Basophils Absolute: 0 K/uL (ref 0.0–0.1)
Basophils Relative: 0.6 % (ref 0.0–3.0)
Eosinophils Absolute: 0.1 K/uL (ref 0.0–0.7)
Eosinophils Relative: 1.9 % (ref 0.0–5.0)
HCT: 36.4 % (ref 36.0–46.0)
Hemoglobin: 11.4 g/dL — ABNORMAL LOW (ref 12.0–15.0)
Lymphocytes Relative: 10.8 % — ABNORMAL LOW (ref 12.0–46.0)
Lymphs Abs: 0.7 K/uL (ref 0.7–4.0)
MCHC: 31.4 g/dL (ref 30.0–36.0)
MCV: 75.2 fl — ABNORMAL LOW (ref 78.0–100.0)
Monocytes Absolute: 0.9 K/uL (ref 0.1–1.0)
Monocytes Relative: 13 % — ABNORMAL HIGH (ref 3.0–12.0)
Neutro Abs: 4.9 K/uL (ref 1.4–7.7)
Neutrophils Relative %: 73.7 % (ref 43.0–77.0)
Platelets: 219 K/uL (ref 150.0–400.0)
RBC: 4.84 Mil/uL (ref 3.87–5.11)
RDW: 17.8 % — ABNORMAL HIGH (ref 11.5–15.5)
WBC: 6.6 K/uL (ref 4.0–10.5)

## 2024-01-05 LAB — TSH: TSH: 0.71 u[IU]/mL (ref 0.35–5.50)

## 2024-01-16 ENCOUNTER — Other Ambulatory Visit: Payer: Self-pay | Admitting: Family Medicine

## 2024-01-16 ENCOUNTER — Ambulatory Visit: Admitting: Family Medicine

## 2024-01-16 ENCOUNTER — Encounter: Payer: Self-pay | Admitting: Family Medicine

## 2024-01-16 ENCOUNTER — Telehealth: Payer: Self-pay

## 2024-01-16 VITALS — BP 136/66 | HR 98 | Temp 97.8°F | Ht 64.0 in | Wt 113.1 lb

## 2024-01-16 DIAGNOSIS — I10 Essential (primary) hypertension: Secondary | ICD-10-CM | POA: Diagnosis not present

## 2024-01-16 DIAGNOSIS — R0600 Dyspnea, unspecified: Secondary | ICD-10-CM | POA: Diagnosis not present

## 2024-01-16 DIAGNOSIS — D649 Anemia, unspecified: Secondary | ICD-10-CM

## 2024-01-16 DIAGNOSIS — M810 Age-related osteoporosis without current pathological fracture: Secondary | ICD-10-CM

## 2024-01-16 LAB — CBC WITH DIFFERENTIAL/PLATELET
Basophils Absolute: 0.1 K/uL (ref 0.0–0.1)
Basophils Relative: 1 % (ref 0.0–3.0)
Eosinophils Absolute: 0.1 K/uL (ref 0.0–0.7)
Eosinophils Relative: 1.6 % (ref 0.0–5.0)
HCT: 36.2 % (ref 36.0–46.0)
Hemoglobin: 11.2 g/dL — ABNORMAL LOW (ref 12.0–15.0)
Lymphocytes Relative: 13.6 % (ref 12.0–46.0)
Lymphs Abs: 0.7 K/uL (ref 0.7–4.0)
MCHC: 30.8 g/dL (ref 30.0–36.0)
MCV: 75.5 fl — ABNORMAL LOW (ref 78.0–100.0)
Monocytes Absolute: 0.8 K/uL (ref 0.1–1.0)
Monocytes Relative: 15.1 % — ABNORMAL HIGH (ref 3.0–12.0)
Neutro Abs: 3.6 K/uL (ref 1.4–7.7)
Neutrophils Relative %: 68.7 % (ref 43.0–77.0)
Platelets: 212 K/uL (ref 150.0–400.0)
RBC: 4.8 Mil/uL (ref 3.87–5.11)
RDW: 17.8 % — ABNORMAL HIGH (ref 11.5–15.5)
WBC: 5.2 K/uL (ref 4.0–10.5)

## 2024-01-16 LAB — BASIC METABOLIC PANEL WITH GFR
BUN: 15 mg/dL (ref 6–23)
CO2: 30 meq/L (ref 19–32)
Calcium: 9.2 mg/dL (ref 8.4–10.5)
Chloride: 106 meq/L (ref 96–112)
Creatinine, Ser: 0.81 mg/dL (ref 0.40–1.20)
GFR: 65.16 mL/min (ref >60.00)
Glucose, Bld: 92 mg/dL (ref 70–99)
Potassium: 4.3 meq/L (ref 3.5–5.1)
Sodium: 140 meq/L (ref 135–145)

## 2024-01-16 LAB — VITAMIN B12: Vitamin B-12: 376 pg/mL (ref 211–911)

## 2024-01-16 LAB — IRON: Iron: 28 ug/dL — ABNORMAL LOW (ref 42–145)

## 2024-01-16 LAB — FERRITIN: Ferritin: 10.6 ng/mL (ref 10.0–291.0)

## 2024-01-16 MED ORDER — FUROSEMIDE 20 MG PO TABS: 20.0000 mg | ORAL_TABLET | Freq: Every day | ORAL | Status: DC | PRN

## 2024-01-16 MED ORDER — ENALAPRIL MALEATE 10 MG PO TABS: 10.0000 mg | ORAL_TABLET | Freq: Every day | ORAL | 3 refills | Status: DC

## 2024-01-16 MED ORDER — PANTOPRAZOLE SODIUM 40 MG PO TBEC: 40.0000 mg | DELAYED_RELEASE_TABLET | Freq: Two times a day (BID) | ORAL | 3 refills | Status: AC

## 2024-01-16 NOTE — Progress Notes (Addendum)
 She is taking lasix  rarely, 20mg  as needed for fluid.    She isn't smoking.    She got a letter about xarelto  coverage.  I will check with staff about that-hardcopy passed to staff for processing.  D/w pt.  H/o PAD with CT pending per vascular.  More pain with B legs with walking.  No skin breakdown.    D/w pt about prev labs/HTN eval.  Patient is back on enalapril .  Anemia noted.  No bloody or black stools.  No bloody urine.  No known blood loss.    She is currently off alendronate .  Discussed holding that for now, until the above is addressed.    Meds, vitals, and allergies reviewed.   ROS: Per HPI unless specifically indicated in ROS section   Nad Ncat Neck supple, no LA Rrr Ctab Abd soft, not ttp Calf not ttp B Mild B foot edema.

## 2024-01-16 NOTE — Telephone Encounter (Signed)
 Pt has OV today. Refill will will be sent in at visit.

## 2024-01-16 NOTE — Telephone Encounter (Signed)
 Pt here for OV today stating her insurance is needs PA for Xarelto  2.5 mg. Will fwd to rx PA team.   Pls submit PA.

## 2024-01-16 NOTE — Patient Instructions (Addendum)
 I will check on xarelto  coverage.    Go to the lab on the way out.   If you have mychart we'll likely use that to update you.    I'll await your CT report.  Please use the kit to check your stool for blood.  Take care.  Glad to see you.

## 2024-01-17 ENCOUNTER — Other Ambulatory Visit (HOSPITAL_COMMUNITY): Payer: Self-pay

## 2024-01-17 ENCOUNTER — Ambulatory Visit (HOSPITAL_COMMUNITY)
Admission: RE | Admit: 2024-01-17 | Discharge: 2024-01-17 | Disposition: A | Discharge status: Home / Self Care | Source: Ambulatory Visit | Attending: Cardiology | Admitting: Cardiology

## 2024-01-17 ENCOUNTER — Telehealth: Payer: Self-pay

## 2024-01-17 DIAGNOSIS — I70213 Atherosclerosis of native arteries of extremities with intermittent claudication, bilateral legs: Secondary | ICD-10-CM | POA: Diagnosis present

## 2024-01-17 MED ORDER — IOHEXOL 350 MG/ML SOLN
100.0000 mL | Freq: Once | INTRAVENOUS | Status: AC | PRN
  Administered 2024-01-17: 100 mL via INTRAVENOUS

## 2024-01-17 NOTE — Telephone Encounter (Signed)
 Noted (see following message by clicking blue 'View Conversation' link):  No PA required. Patient filled 01/03/24 for 200 tabs. Next fill 03/18/24. Please see encounter 01/17/24.   Spoke with pt relaying info above. Pt verbalizes understanding.

## 2024-01-17 NOTE — Telephone Encounter (Signed)
 Noted. Pt aware (see 01/16/24 phn note).

## 2024-01-17 NOTE — Telephone Encounter (Signed)
 Pharmacy Patient Advocate Encounter   Received notification from Pt Calls Messages that prior authorization for Xarelto  2.5 is required/requested.   Insurance verification completed.   The patient is insured through [facility].   Per test claim: Refill too soon. PA is not needed at this time. Medication was filled 01/03/24. Next eligible fill date is 03/18/24.   Per CMM:

## 2024-01-18 NOTE — Assessment & Plan Note (Signed)
 Blood pressure improved.  Continue enalapril  as is for now.

## 2024-01-18 NOTE — Assessment & Plan Note (Signed)
 No change in meds for now.  See notes on follow-up labs. IFOB pending.

## 2024-01-21 ENCOUNTER — Ambulatory Visit: Payer: Self-pay | Admitting: Family Medicine

## 2024-01-21 DIAGNOSIS — I739 Peripheral vascular disease, unspecified: Secondary | ICD-10-CM

## 2024-01-21 DIAGNOSIS — R195 Other fecal abnormalities: Secondary | ICD-10-CM

## 2024-01-21 DIAGNOSIS — D649 Anemia, unspecified: Secondary | ICD-10-CM

## 2024-01-21 MED ORDER — IRON (FERROUS SULFATE) 325 (65 FE) MG PO TABS: 325.0000 mg | ORAL_TABLET | Freq: Every day | ORAL | Status: AC

## 2024-01-21 MED ORDER — VITAMIN B-12 1000 MCG PO TABS: 1000.0000 ug | ORAL_TABLET | Freq: Every day | ORAL | Status: AC

## 2024-01-22 ENCOUNTER — Other Ambulatory Visit: Payer: Self-pay | Admitting: Radiology

## 2024-01-22 ENCOUNTER — Other Ambulatory Visit: Payer: Self-pay | Admitting: Family Medicine

## 2024-01-22 DIAGNOSIS — D649 Anemia, unspecified: Secondary | ICD-10-CM

## 2024-01-22 NOTE — Telephone Encounter (Unsigned)
 Copied from CRM #[id number]. Topic: Clinical - Medication Refill >> Jan 22, 2024 11:37 AM Alfonso HERO wrote: Medication: SPIRIVA  HANDIHALER 18 MCG inhalation capsule enalapril  (VASOTEC ) 10 MG tablet  Has the patient contacted their pharmacy? Yes (Agent: If no, request that the patient contact the pharmacy for the refill. If patient does not wish to contact the pharmacy document the reason why and proceed with request.) (Agent: If yes, when and what did the pharmacy advise?)  This is the patient's preferred pharmacy:  [facility] - [place] - [address] [place] [address] Phone: [phone] Fax: [phone]  Is this the correct pharmacy for this prescription? Yes If no, delete pharmacy and type the correct one.   Has the prescription been filled recently? Yes  Is the patient out of the medication? Yes  Has the patient been seen for an appointment in the last year OR does the patient have an upcoming appointment? Yes  Can we respond through MyChart? Yes  Agent: Please be advised that Rx refills may take up to 3 business days. We ask that you follow-up with your pharmacy.

## 2024-01-22 NOTE — Telephone Encounter (Signed)
 Please check with patient.  She should have refill available at the mail order pharmacy.  If she needs a short supply sent locally, please let me know. Thanks.

## 2024-01-24 LAB — FECAL OCCULT BLOOD, IMMUNOCHEMICAL: Fecal Occult Bld: POSITIVE — AB

## 2024-01-24 MED ORDER — TIOTROPIUM BROMIDE 18 MCG IN CAPS: 1.0000 | ORAL_CAPSULE | Freq: Every day | RESPIRATORY_TRACT | 3 refills | Status: DC

## 2024-01-24 MED ORDER — ENALAPRIL MALEATE 10 MG PO TABS: 10.0000 mg | ORAL_TABLET | Freq: Every day | ORAL | 3 refills | Status: DC

## 2024-01-24 MED ORDER — RIVAROXABAN 2.5 MG PO TABS: ORAL_TABLET | ORAL | Status: DC

## 2024-01-24 NOTE — Telephone Encounter (Signed)
 Spoke with pt and she states that [facility] did not receive the refill that Dr. Cleatus sent last week. Pt needs this resent along with refills on Spiriva . These have been sent in. Nothing further needed.

## 2024-01-25 NOTE — Telephone Encounter (Signed)
 Pt returning [place] phone call from this morning. She states to please call her back because she is home now. Thank you

## 2024-01-29 ENCOUNTER — Ambulatory Visit: Payer: Self-pay

## 2024-01-29 NOTE — Telephone Encounter (Signed)
 FYI Only or Action Required?: FYI only for provider: appointment scheduled on 02/01/24.  Patient was last seen in primary care on 01/16/2024 by Cleatus Arlyss RAMAN, MD.  Called Nurse Triage reporting Dizziness.  Symptoms began several days ago.  Interventions attempted: Prescription medications: Enalatril 10mg  once daily .  Symptoms are: unchanged.  Triage Disposition: See PCP When Office is Open (Within 3 Days)  Patient/caregiver understands and will follow disposition?: Yes   Copied from CRM [id number]. Topic: Clinical - Red Word Triage >> Jan 29, 2024  1:43 PM Harlene ORN wrote: Red Word that prompted transfer to Nurse Triage: BP is low even taking her medicine. Having dizziness.    Reason for Disposition  [1] MILD dizziness (e.g., walking normally) AND [2] has NOT been evaluated by doctor (or NP/PA) for this  (Exception: Dizziness caused by heat exposure, sudden standing, or poor fluid intake.)  Answer Assessment - Initial Assessment Questions Pt called in stating that she has been experiencing dizziness over the past couple days. She denies any falls, chest pain, SOB or vision changes. She states she normally takes Enalatril 10mg  once daily but noticed dizziness is present even when she misses doses so she is unsure if it is BP related. Pt is tracking BP and reports having other people in the home with her. Pt is not fearful of falling; states dizziness occurs when she is on her feet too long. Discussed when symptoms start, she sits down. Educated on red flag symptoms and home care of fluids and elevating feet when dizziness occurs. Pt elects to wait to see her PCP. Appointment scheduled for evaluation. Patient agrees with plan of care, and will call back if anything changes, or if symptoms worsen.     1. DESCRIPTION: Describe your dizziness.     Pt reports feeling weak, denies falls. Pt states she thought it was BP related but dizziness is occurring even when she does not take her  medication.   2. LIGHTHEADED: Do you feel lightheaded? (e.g., somewhat faint, woozy, weak upon standing)     Woozy, weak if stands too long   3. VERTIGO: Do you feel like either you or the room is spinning or tilting? (i.e., vertigo)     No   4. SEVERITY: How bad is it?  Do you feel like you are going to faint? Can you stand and walk?     Able to walk; does not use any devices to assist with ambulation   5. ONSET:  When did the dizziness begin?     Occurs if she's on her feet too long   6. AGGRAVATING FACTORS: Does anything make it worse? (e.g., standing, change in head position)     Standing too long   7. HEART RATE: Can you tell me your heart rate? How many beats in 15 seconds?  (Note: Not all patients can do this.)       No; Pt tracking BP, Saturday 145/69, no data for Sunday, today 105/65  8. CAUSE: What do you think is causing the dizziness? (e.g., decreased fluids or food, diarrhea, emotional distress, heat exposure, new medicine, sudden standing, vomiting; unknown)     Unknown; she thought it was her BP medication, Enalatril 10mg  once daily. But states she did not take medication today and dizziness is still present   9. RECURRENT SYMPTOM: Have you had dizziness before? If Yes, ask: When was the last time? What happened that time?     No  10. OTHER SYMPTOMS: Do you  have any other symptoms? (e.g., fever, chest pain, vomiting, diarrhea, bleeding)       None  Protocols used: Dizziness - Lightheadedness-A-AH

## 2024-01-30 ENCOUNTER — Ambulatory Visit: Admitting: Vascular Surgery

## 2024-01-30 ENCOUNTER — Encounter: Payer: Self-pay | Admitting: Physician Assistant

## 2024-01-30 NOTE — Telephone Encounter (Signed)
 Noted. Thanks.

## 2024-02-01 ENCOUNTER — Encounter: Payer: Self-pay | Admitting: Family Medicine

## 2024-02-01 ENCOUNTER — Ambulatory Visit: Admitting: Family Medicine

## 2024-02-01 VITALS — BP 118/60 | HR 99 | Temp 98.0°F | Ht 64.0 in | Wt 112.0 lb

## 2024-02-01 DIAGNOSIS — D649 Anemia, unspecified: Secondary | ICD-10-CM

## 2024-02-01 LAB — CBC WITH DIFFERENTIAL/PLATELET
Basophils Absolute: 0.1 K/uL (ref 0.0–0.1)
Basophils Relative: 1.2 % (ref 0.0–3.0)
Eosinophils Absolute: 0.1 K/uL (ref 0.0–0.7)
Eosinophils Relative: 2.5 % (ref 0.0–5.0)
HCT: 38.8 % (ref 36.0–46.0)
Hemoglobin: 12.3 g/dL (ref 12.0–15.0)
Lymphocytes Relative: 11.1 % — ABNORMAL LOW (ref 12.0–46.0)
Lymphs Abs: 0.6 K/uL — ABNORMAL LOW (ref 0.7–4.0)
MCHC: 31.6 g/dL (ref 30.0–36.0)
MCV: 76 fl — ABNORMAL LOW (ref 78.0–100.0)
Monocytes Absolute: 0.6 K/uL (ref 0.1–1.0)
Monocytes Relative: 12.6 % — ABNORMAL HIGH (ref 3.0–12.0)
Neutro Abs: 3.8 K/uL (ref 1.4–7.7)
Neutrophils Relative %: 72.6 % (ref 43.0–77.0)
Platelets: 208 K/uL (ref 150.0–400.0)
RBC: 5.1 Mil/uL (ref 3.87–5.11)
RDW: 19.1 % — ABNORMAL HIGH (ref 11.5–15.5)
WBC: 5.2 K/uL (ref 4.0–10.5)

## 2024-02-01 LAB — BASIC METABOLIC PANEL WITH GFR
BUN: 17 mg/dL (ref 6–23)
CO2: 31 meq/L (ref 19–32)
Calcium: 9.5 mg/dL (ref 8.4–10.5)
Chloride: 100 meq/L (ref 96–112)
Creatinine, Ser: 0.82 mg/dL (ref 0.40–1.20)
GFR: 64.19 mL/min (ref >60.00)
Glucose, Bld: 121 mg/dL — ABNORMAL HIGH (ref 70–99)
Potassium: 3.9 meq/L (ref 3.5–5.1)
Sodium: 137 meq/L (ref 135–145)

## 2024-02-01 MED ORDER — ENALAPRIL MALEATE 10 MG PO TABS: ORAL_TABLET | ORAL | Status: DC

## 2024-02-01 MED ORDER — TIOTROPIUM BROMIDE 18 MCG IN CAPS: 1.0000 | ORAL_CAPSULE | Freq: Every day | RESPIRATORY_TRACT | 3 refills | Status: AC

## 2024-02-01 NOTE — Progress Notes (Signed)
 She has GI eval pending.  Held xarelto  in the meantime.  Rationale d/w pt.  Still having black stools but iron  could be responsible for that.    She is off enalapril  currently.  She is taking iron  and B12.    Dizziness d/w pt.  Not constant.  No room spinning. She feels off balance due to lightheadedness.  No sx supine or sitting.  Only noted with standing but not all of the time.    She had been taking 2 tabs of lasix  each AM, which helped with BLE edema.    She is going to need help with xarelto  med assistance if she going to restart med.  She has supply for now, though med is held.   She is out of spiriva .  Rx sent today.   Meds, vitals, and allergies reviewed.   ROS: Per HPI unless specifically indicated in ROS section   Nad Ncat Neck supple, no LA Rrr Ctab She didn't have heart rate inc on standing. Not dizzy on standing at the visit.   Trace pedal edema Skin well perfused.   30 minutes were devoted to patient care in this encounter (this includes time spent reviewing the patient's file/history, interviewing and examining the patient, counseling/reviewing plan with patient).

## 2024-02-01 NOTE — Patient Instructions (Signed)
 Try either cutting back lasix  to 1 a day or skip a day totally.  Go to the lab on the way out.   If you have mychart we'll likely use that to update you.    Take care.  Glad to see you.

## 2024-02-04 ENCOUNTER — Ambulatory Visit: Payer: Self-pay | Admitting: Family Medicine

## 2024-02-04 NOTE — Assessment & Plan Note (Addendum)
 With GI f/u pending. See notes on labs.  Xarelto  on hold for now.   Prev lightheaded.   Would try either cutting back lasix  to 1 a day or skip a day totally.  Has held enalapril .

## 2024-02-05 ENCOUNTER — Other Ambulatory Visit: Payer: Self-pay | Admitting: Family Medicine

## 2024-02-05 ENCOUNTER — Ambulatory Visit: Admitting: Physician Assistant

## 2024-02-05 ENCOUNTER — Encounter: Payer: Self-pay | Admitting: Physician Assistant

## 2024-02-05 ENCOUNTER — Telehealth: Payer: Self-pay | Admitting: Emergency Medicine

## 2024-02-05 VITALS — BP 142/74 | HR 99 | Ht 64.0 in | Wt 113.5 lb

## 2024-02-05 DIAGNOSIS — J449 Chronic obstructive pulmonary disease, unspecified: Secondary | ICD-10-CM

## 2024-02-05 DIAGNOSIS — K31819 Angiodysplasia of stomach and duodenum without bleeding: Secondary | ICD-10-CM

## 2024-02-05 DIAGNOSIS — D509 Iron deficiency anemia, unspecified: Secondary | ICD-10-CM

## 2024-02-05 DIAGNOSIS — I739 Peripheral vascular disease, unspecified: Secondary | ICD-10-CM

## 2024-02-05 NOTE — Telephone Encounter (Signed)
 She is already off xarelto .  That has been held in the meantime.  Thank you.

## 2024-02-05 NOTE — Patient Instructions (Signed)
 You have been scheduled for an endoscopy. Please follow written instructions given to you at your visit today.  If you use inhalers (even only as needed), please bring them with you on the day of your procedure.  If you take any of the following medications, they will need to be adjusted prior to your procedure:   DO NOT TAKE 7 DAYS PRIOR TO TEST- Trulicity (dulaglutide) Ozempic, Wegovy (semaglutide) Mounjaro, Zepbound (tirzepatide) Bydureon Bcise (exanatide extended release)  DO NOT TAKE 1 DAY PRIOR TO YOUR TEST Rybelsus (semaglutide) Adlyxin (lixisenatide) Victoza (liraglutide) Byetta (exanatide) ___________________________________________________________________________  Due to recent changes in healthcare laws, you may see the results of your imaging and laboratory studies on MyChart before your provider has had a chance to review them.  We understand that in some cases there may be results that are confusing or concerning to you. Not all laboratory results come back in the same time frame and the provider may be waiting for multiple results in order to interpret others.  Please give us  48 hours in order for your provider to thoroughly review all the results before contacting the office for clarification of your results.   _______________________________________________________  If your blood pressure at your visit was 140/90 or greater, please contact your primary care physician to follow up on this.  _______________________________________________________  If you are age 62 or older, your body mass index should be between 23-30. Your Body mass index is 19.48 kg/m. If this is out of the aforementioned range listed, please consider follow up with your Primary Care Provider.  If you are age 52 or younger, your body mass index should be between 19-25. Your Body mass index is 19.48 kg/m. If this is out of the aformentioned range listed, please consider follow up with your Primary Care  Provider.   ________________________________________________________  The [facility] providers would like to encourage you to use MYCHART to communicate with providers for non-urgent requests or questions.  Due to long hold times on the telephone, sending your provider a message by [facility] may be a faster and more efficient way to get a response.  Please allow 48 business hours for a response.  Please remember that this is for non-urgent requests.  _______________________________________________________  [facility] is using a team-based approach to care.  Your team is made up of your doctor and two to three APPS. Our APPS (Nurse Practitioners and Physician Assistants) work with your physician to ensure care continuity for you. They are fully qualified to address your health concerns and develop a treatment plan. They communicate directly with your gastroenterologist to care for you. Seeing the Advanced Practice Practitioners on your physician's team can help you by facilitating care more promptly, often allowing for earlier appointments, access to diagnostic testing, procedures, and other specialty referrals.

## 2024-02-05 NOTE — Progress Notes (Signed)
 02/05/2024 Jill Franco [id number] [birth year]  Referring provider: Cleatus Arlyss RAMAN, MD Primary GI doctor: Dr. Federico (Dr. 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 History of IDA with hematochezia and melena in 2021 status post colonoscopy in EGD which showed gastric AVM, cecal small AVM and semipedunculated TA in the rectum possible bleeding site now with Has had dark stools x 2 weeks but may have been associated  Likely due to gastrointestinal bleeding. Hemoglobin improved to 12.3. Low iron  and ferritin. Positive Hemocult card. Dark stools likely from iron  supplementation, bleeding likely from upper GI tract, possibly gastric AVMs. Less likely colon AVMs - Continue iron  supplementation. - Perform upper endoscopy in the hospital to evaluate bleeding source, particularly gastric AVMs and possible need for APC. - Coordinate with primary care on Xarelto  management; hold Xarelto  two days prior to procedure if resumed. - ER precautions discussed - prefers to avoid colonoscopy, discussed if EGD is negative may have to consider colonoscopy but I think this is less likely  Peripheral arterial disease status post iliac artery/aortic stent 2018 Follows with Dr. Rosan vascular surgery On Xarelto  2.5 mg, held 01/24/2024 by PCP  COPD Not on oxygen  GERD Well controlled on Protonix    Patient Care Team: Cleatus Arlyss RAMAN, MD as PCP - General (Family Medicine) Cleotilde Sewer, OD as Referring Physician (Optometry) Lavonia Lye, MD as Consulting Physician (Ophthalmology)  HISTORY OF PRESENT ILLNESS: 89 y.o. female with a past medical history listed below presents for evaluation of IDA.   Last seen in the office 05/03/2019 by Elida Nyle Sharps for iron  deficiency anemia and hematochezia.  Discussed the use of AI scribe software for clinical note transcription with the patient, who gave verbal consent to proceed.  History of Present Illness   Jill Franco is an 89 year old female with a history of gastrointestinal bleeding who presents with dark stools and dizziness.  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 An endoscopy was performed at that time, and clips were placed. She was on Plavix  during that period but has since switched to Xarelto  2.5 mg twice daily, which she stopped on December 3rd.  Recently, she experienced dark stools for about two weeks, which she attributes to starting iron  supplements a week ago. The dark stools began after starting the iron . She also experienced dizziness, which has improved since starting the iron . No bright red blood in her stool, chest pain, or significant shortness of breath, although she occasionally feels short of breath.  Her hemoglobin levels have dropped from her usual 15-16 to 11, and then increased to 12.3. Her iron  levels are low, and a Hemocult test was positive for blood. She has a history of anemia and low ferritin levels.  She has a history of COPD but is not on oxygen and has  quit smoking nearly a year ago. She experiences heartburn, for which she takes Protonix  twice daily, effectively managing her symptoms. No difficulty swallowing.        She  reports that she quit smoking about 3 years ago. Her smoking use  included cigarettes. She started smoking about 72 years ago. She has a 17.3 pack-year smoking history. She has been exposed to tobacco smoke. She has never used smokeless tobacco. She reports that she does not drink alcohol and does not use drugs.  RELEVANT GI HISTORY, IMAGING AND LABS: Results   LABS Hemoglobin: 14 (04/2023) Hemoglobin: 11 Hemoglobin: 12.3 Hemoccult: Positive      CBC    Component Value Date/Time   WBC 5.2 02/01/2024 1229   RBC 5.10 02/01/2024 1229   HGB 12.3 02/01/2024 1229   HCT 38.8 02/01/2024 1229   PLT 208.0 02/01/2024 1229   MCV 76.0 (L) 02/01/2024 1229   MCH 31.6 03/22/2023 0447   MCHC 31.6 02/01/2024 1229   RDW 19.1 (H) 02/01/2024 1229   LYMPHSABS 0.6 (L) 02/01/2024 1229   MONOABS 0.6 02/01/2024 1229   EOSABS 0.1 02/01/2024 1229   BASOSABS 0.1 02/01/2024 1229   Recent Labs    03/17/23 2053 03/19/23 0748 03/20/23 0503 03/21/23 0442 03/22/23 0447 03/27/23 0845 04/24/23 1125 01/04/24 1444 01/16/24 1040 02/01/24 1229  HGB 15.3* 16.6* 15.7* 15.9* 15.8* 16.7* 14.0 11.4* 11.2* 12.3    CMP     Component Value Date/Time   NA 137 02/01/2024 1229   NA 143 04/06/2021 0000   K 3.9 02/01/2024 1229   CL 100 02/01/2024 1229   CO2 31 02/01/2024 1229   GLUCOSE 121 (H) 02/01/2024 1229   BUN 17 02/01/2024 1229   BUN 23 04/06/2021 0000   CREATININE 0.82 02/01/2024 1229   CALCIUM  9.5 02/01/2024 1229   PROT 6.3 01/04/2024 1444   ALBUMIN 4.1 01/04/2024 1444   AST 17 01/04/2024 1444   ALT 13 01/04/2024 1444   ALKPHOS 67 01/04/2024 1444   BILITOT 0.5 01/04/2024 1444   GFRNONAA >60 03/22/2023 0447   GFRAA >60 04/26/2019 1505      Latest Ref Rng & Units 01/04/2024    2:44 PM 04/24/2023   11:25 AM 03/27/2023    8:45 AM  Hepatic Function  Total Protein 6.0 - 8.3 g/dL 6.3  6.2  5.9   Albumin 3.5 - 5.2 g/dL 4.1  3.6  3.8   AST 0 - 37 U/L 17  19  29    ALT 0 - 35 U/L 13  17  49   Alk Phosphatase 39 - 117 U/L 67  65  69   Total Bilirubin 0.2 - 1.2 mg/dL  0.5  0.5  1.0       Current Medications:   Current Outpatient Medications (Endocrine & Metabolic):    levothyroxine  (SYNTHROID ) 50 MCG tablet, TAKE 1 TABLET BY MOUTH DAILY  EXCEPT 2 TABLETS ON SUNDAYS AND  WEDNESDAYS ( TOTAL 9 TABS PER  WEEK)   alendronate  (FOSAMAX ) 70 MG tablet, TAKE 1 TABLET BY MOUTH WEEKLY  WITH 8 OZ OF PLAIN WATER 30  MINUTES BEFORE FIRST FOOD, DRINK OR MEDS. STAY UPRIGHT FOR 30  MINS (Patient not taking: Reported on 02/05/2024)  Current Outpatient Medications (Cardiovascular):    rosuvastatin  (CRESTOR ) 20 MG tablet, TAKE 1 TABLET BY MOUTH DAILY   enalapril  (VASOTEC ) 10 MG tablet, Held as of 01/25/2024 (Patient not taking: Reported on 02/05/2024)   furosemide  (LASIX ) 20 MG tablet, Take 1 tablet (20 mg total)  by mouth daily as needed for fluid. (Patient not taking: Reported on 02/05/2024)  Current Outpatient Medications (Respiratory):    albuterol  (VENTOLIN  HFA) 108 (90 Base) MCG/ACT inhaler, USE 2 INHALATIONS BY MOUTH EVERY 6 HOURS AS NEEDED FOR WHEEZING  OR SHORTNESS OF BREATH   ipratropium-albuterol  (DUONEB) 0.5-2.5 (3) MG/3ML SOLN, Take 3 mLs by nebulization every 6 (six) hours as needed.   SYMBICORT  160-4.5 MCG/ACT inhaler, USE 2 INHALATIONS BY MOUTH IN  THE MORNING AND AT BEDTIME .  RINSE AFTER USE   Tiotropium Bromide  (SPIRIVA  HANDIHALER) 18 MCG CAPS, Place 1 Inhalation into inhaler and inhale daily. INHALE THE CONTENTS OF 1 CAPSULE BY MOUTH VIA INHALATION DEVICE  DAILY  Current Outpatient Medications (Analgesics):    acetaminophen  (TYLENOL ) 650 MG CR tablet, Take 650 mg by mouth every 8 (eight) hours as needed for pain.   aspirin  EC 81 MG tablet, Take 1 tablet (81 mg total) by mouth daily. Swallow whole.  Current Outpatient Medications (Hematological):    cyanocobalamin  (VITAMIN B12) 1000 MCG tablet, Take 1 tablet (1,000 mcg total) by mouth daily.   Iron , Ferrous Sulfate , 325 (65 Fe) MG TABS, Take 325 mg by mouth daily.   rivaroxaban  (XARELTO ) 2.5 MG TABS  tablet, Held as of 01/24/24 (Patient not taking: Reported on 02/01/2024)  Current Outpatient Medications (Other):    Cholecalciferol  (VITAMIN D3) 25 MCG (1000 UT) CAPS, Take 1 capsule (1,000 Units total) by mouth daily.   diclofenac  Sodium (VOLTAREN ) 1 % GEL, Apply 2 g topically 4 (four) times daily.   melatonin 5 MG TABS, Take 5 mg by mouth at bedtime as needed (For sleep).   methocarbamol  (ROBAXIN ) 500 MG tablet, TAKE 1 TABLET BY MOUTH TWICE DAILY AS NEEDED FOR MUSCLE SPASM   pantoprazole  (PROTONIX ) 40 MG tablet, Take 1 tablet (40 mg total) by mouth 2 (two) times daily.   sucralfate  (CARAFATE ) 1 g tablet, Take 1 tablet (1 g total) by mouth 4 (four) times daily -  with meals and at bedtime. (Patient not taking: Reported on 02/05/2024)  Medical History:  Past Medical History:  Diagnosis Date   Cataract    bilaterally corrected.   COPD (chronic obstructive pulmonary disease) (HCC)    CVA (cerebral vascular accident) (HCC)    Pt was unaware and does not know when it happened.  Was told after some testing was done.   Double vision    Dyspnea    Facial droop 10/2017   left   History of shingles 2012   HLD (hyperlipidemia)    HTN (hypertension)    Hypothyroidism    PVD (peripheral vascular disease)    Vision loss    temporary   Allergies: Allergies[1]   Surgical History:  She  has a past surgical history that includes Appendectomy (childhood); Abdominal exploration surgery (1950s); Laminectomy and microdiscectomy spine (1960's X 2); NSVD; carotid ultrasound (06/19/2007); doppler echocardiography (06/19/2007); Iliac artery stent (Left, 03/18/2016); Back surgery; Breast lumpectomy (Left, 1960's); Dilation and curettage of uterus (1960's X 3); Cardiac catheterization (N/A, 03/18/2016); Cardiac catheterization (03/18/2016); Mole removal (07/10/2016); Cataract extraction w/ intraocular lens implant (Left, 09/2016); Cataract extraction w/ intraocular lens implant (Right, 10/2016); Colonoscopy with  propofol  (N/A, 04/08/2019); polypectomy (04/08/2019); Endarterectomy femoral (Left, 12/03/2020); Insertion of iliac stent (Left, 12/03/2020); Ultrasound guidance for vascular access (Right, 12/03/2020); lower extremity angiogram (N/A, 12/03/2020); Patch angioplasty (Left, 12/03/2020); ABDOMINAL AORTOGRAM (N/A, 04/30/2021); PERIPHERAL VASCULAR BALLOON ANGIOPLASTY (Right, 04/30/2021); and PERIPHERAL VASCULAR INTERVENTION (Left, 04/30/2021). Family History:  Her family history includes Cancer in her brother;  Stroke (age of onset: 1) in her father.  REVIEW OF SYSTEMS  : All other systems reviewed and negative except where noted in the History of Present Illness.  PHYSICAL EXAM: BP (!) 142/74   Pulse 99   Ht 5' 4 (1.626 m)   Wt 113 lb 8 oz (51.5 kg)   BMI 19.48 kg/m  Physical Exam   GENERAL APPEARANCE: Well nourished, in no apparent distress. HEENT: No cervical lymphadenopathy, unremarkable thyroid , sclerae anicteric, conjunctiva pink. RESPIRATORY: Respiratory effort normal, breath sounds equal bilaterally without rales, rhonchi, or wheezing. CARDIO: Regular rate and rhythm with no murmurs, rubs, or gallops, peripheral pulses intact. ABDOMEN: Soft, non-distended, active bowel sounds in all four quadrants, no tenderness to palpation, no rebound, no mass appreciated. RECTAL: No ulcers or hemorrhoids, no rectal masses, brown stool, no bright red blood, stool hemoccult negative. MUSCULOSKELETAL: Full range of motion, normal gait, without edema. SKIN: Dry, intact without rashes or lesions. No jaundice. NEURO: Alert, oriented, no focal deficits. PSYCH: Cooperative, normal mood and affect.      Alan JONELLE Coombs, PA-C 2:46 PM      [1]  Allergies Allergen Reactions   Lipitor [Atorvastatin ] Other (See Comments)    myalgia   Advil [Ibuprofen] Nausea And Vomiting   Aspirin  Other (See Comments)    Uncoated aspirin  causes stomach upset on empty stomach   Codeine Nausea Only and Other (See Comments)     Pill needs to be E.coated   Other Itching and Rash    Nicotine  patches

## 2024-02-05 NOTE — Telephone Encounter (Signed)
 Dear Dr. Arlyss Solian:  We have scheduled the above named patient for a(n) Endoscopy procedure. Our records show that (s)he is on anticoagulation therapy.  Please advise as to whether the patient may come off their therapy of Xarelto  2 days prior to their procedure which is scheduled for 03/04/2024.  Please route your response to Leldon Steege or fax response to [phone].  Sincerely,    [facility]

## 2024-02-12 ENCOUNTER — Ambulatory Visit: Payer: Self-pay

## 2024-02-12 NOTE — Telephone Encounter (Signed)
 Copied from CRM [id number]. Topic: Clinical - Red Word Triage >> Feb 12, 2024  4:37 PM Wess RAMAN wrote: Red Word that prompted transfer to Nurse Triage: Dizzy all day. She stated she was told she had vertigo by Dr. Cleatus. Blood pressure was 111 and then went to 134  [facility] called and attempted to transfer pt: pt no longer on line.  Will place encounter in callback folder.

## 2024-02-12 NOTE — Telephone Encounter (Signed)
 Continue to hold Xarelto  per Dr. Cleatus

## 2024-02-12 NOTE — Telephone Encounter (Addendum)
 FYI Only or Action Required?: FYI only for provider: appointment scheduled on 02/13/24. This RN educated pt on home care, new-worsening symptoms, when to call back/seek emergent care. Pt verbalized understanding and agrees to plan.   Patient was last seen in primary care on 02/01/2024 by Cleatus Arlyss RAMAN, MD.  Called Nurse Triage reporting Dizziness.  Symptoms began a week ago.  Interventions attempted: Rest, hydration, or home remedies.  Symptoms are: gradually worsening.  Triage Disposition: No disposition on file.  Patient/caregiver understands and will follow disposition?:   Patient/caregiver understands and will follow disposition?:  Answer Assessment - Initial Assessment Questions 1. DESCRIPTION: Describe your dizziness.     Feels the same as when she had vertigo before  3. VERTIGO: Do you feel like either you or the room is spinning or tilting? (i.e., vertigo)     Pt reports she has a history of vertigo and medication previously prescribed helped a lot 5. ONSET:  When did the dizziness begin?     Ongoing, for awhile now, just getting worse  8. CAUSE: What do you think is causing the dizziness? (e.g., decreased fluids or food, diarrhea, emotional distress, heat exposure, new medicine, sudden standing, vomiting; unknown)    Believes exacerbation of previously diagnosed vertigo 9. RECURRENT SYMPTOM: Have you had dizziness before? If Yes, ask: When was the last time? What happened that time?     Yes, treated with Rx 10. OTHER SYMPTOMS: Do you have any other symptoms? (e.g., fever, chest pain, vomiting, diarrhea, bleeding)       Denies numbness, weakness, changes in speech or vision, CP, SOB  Protocols used: Dizziness - Lightheadedness-A-AH

## 2024-02-13 ENCOUNTER — Ambulatory Visit: Admitting: Family

## 2024-02-13 ENCOUNTER — Other Ambulatory Visit

## 2024-02-13 ENCOUNTER — Encounter: Payer: Self-pay | Admitting: Family

## 2024-02-13 ENCOUNTER — Ambulatory Visit: Payer: Self-pay | Admitting: Family

## 2024-02-13 VITALS — BP 126/84 | HR 60 | Temp 98.1°F | Wt 112.0 lb

## 2024-02-13 DIAGNOSIS — I499 Cardiac arrhythmia, unspecified: Secondary | ICD-10-CM

## 2024-02-13 DIAGNOSIS — Z79899 Other long term (current) drug therapy: Secondary | ICD-10-CM

## 2024-02-13 DIAGNOSIS — H6121 Impacted cerumen, right ear: Secondary | ICD-10-CM

## 2024-02-13 DIAGNOSIS — R600 Localized edema: Secondary | ICD-10-CM

## 2024-02-13 DIAGNOSIS — I491 Atrial premature depolarization: Secondary | ICD-10-CM

## 2024-02-13 DIAGNOSIS — R42 Dizziness and giddiness: Secondary | ICD-10-CM

## 2024-02-13 LAB — CBC WITH DIFFERENTIAL/PLATELET
Basophils Absolute: 0.1 K/uL (ref 0.0–0.1)
Basophils Relative: 1.1 % (ref 0.0–3.0)
Eosinophils Absolute: 0.2 K/uL (ref 0.0–0.7)
Eosinophils Relative: 4 % (ref 0.0–5.0)
HCT: 41 % (ref 36.0–46.0)
Hemoglobin: 12.8 g/dL (ref 12.0–15.0)
Lymphocytes Relative: 10 % — ABNORMAL LOW (ref 12.0–46.0)
Lymphs Abs: 0.5 K/uL — ABNORMAL LOW (ref 0.7–4.0)
MCHC: 31.2 g/dL (ref 30.0–36.0)
MCV: 80.3 fl (ref 78.0–100.0)
Monocytes Absolute: 0.7 K/uL (ref 0.1–1.0)
Monocytes Relative: 13.6 % — ABNORMAL HIGH (ref 3.0–12.0)
Neutro Abs: 3.7 K/uL (ref 1.4–7.7)
Neutrophils Relative %: 71.3 % (ref 43.0–77.0)
Platelets: 207 K/uL (ref 150.0–400.0)
RBC: 5.1 Mil/uL (ref 3.87–5.11)
RDW: 24.8 % — ABNORMAL HIGH (ref 11.5–15.5)
WBC: 5.2 K/uL (ref 4.0–10.5)

## 2024-02-13 LAB — BASIC METABOLIC PANEL WITH GFR
BUN: 18 mg/dL (ref 6–23)
CO2: 26 meq/L (ref 19–32)
Calcium: 9.1 mg/dL (ref 8.4–10.5)
Chloride: 104 meq/L (ref 96–112)
Creatinine, Ser: 0.84 mg/dL (ref 0.40–1.20)
GFR: 62.35 mL/min
Glucose, Bld: 80 mg/dL (ref 70–99)
Potassium: 4.2 meq/L (ref 3.5–5.1)
Sodium: 139 meq/L (ref 135–145)

## 2024-02-13 LAB — BRAIN NATRIURETIC PEPTIDE: Pro B Natriuretic peptide (BNP): 295 pg/mL — ABNORMAL HIGH (ref 1.0–100.0)

## 2024-02-13 LAB — TSH: TSH: 0.61 u[IU]/mL (ref 0.35–5.50)

## 2024-02-13 MED ORDER — FUROSEMIDE 20 MG PO TABS: ORAL_TABLET | ORAL | 0 refills | Status: AC

## 2024-02-13 MED ORDER — MECLIZINE HCL 12.5 MG PO TABS: 12.5000 mg | ORAL_TABLET | Freq: Three times a day (TID) | ORAL | 0 refills | Status: AC | PRN

## 2024-02-13 NOTE — Progress Notes (Signed)
 "  Established Patient Office Visit  Subjective:      CC:  Chief Complaint  Patient presents with   Dizziness    HPI: Jill Franco is a 89 y.o. female presenting on 02/13/2024 for Dizziness .  Discussed the use of AI scribe software for clinical note transcription with the patient, who gave verbal consent to proceed.  History of Present Illness Jill Franco is an 89 year old female with a history of vertigo who presents with worsening dizziness.  She has been experiencing persistent dizziness, which has worsened recently. Previously, she found relief with meclizine , but she has run out of it. The dizziness does not currently feel like the room is spinning. No sinus issues, ear fullness, or pain are present. She recalls the last prescription for meclizine  was in 2018, and she had not been bothered by dizziness until recently.  No chest pain, palpitations, or shortness of breath. She feels more tired than usual over the past week, attributing one instance of fatigue to poor sleep. No feelings of sickness or congestion.  She notes some swelling in her feet, which she attributes to standing while baking. No increased shortness of breath with activity around the house.  She has a history of right carotid stenosis at 1-39% and left carotid stenosis at 40-59%, with non-hemodynamically significant plaque in the CCA. She also has mild concentric hypertrophy of the left ventricle, mild tricuspid regurgitation, and moderate right lower extremity arterial disease. Her last cardiology visit was in 2023. She denies having atrial fibrillation but has a history of atrial premature complexes.         Social history:  Relevant past medical, surgical, family and social history reviewed and updated as indicated. Interim medical history since our last visit reviewed.  Allergies and medications reviewed and updated.  DATA REVIEWED: CHART IN EPIC     ROS: Negative unless specifically  indicated above in HPI.   Current Medications[1]        Objective:        BP 126/84 (BP Location: Left Arm, Patient Position: Sitting, Cuff Size: Normal)   Pulse 60   Temp 98.1 F (36.7 C) (Temporal)   Wt 112 lb (50.8 kg)   SpO2 97%   BMI 19.22 kg/m   Physical Exam VITALS: P- 72, BP- 126/84 HEENT: Cerumen in right ear canal. Left ear canal normal. CARDIOVASCULAR: Irregular rhythm with premature atrial complexes.  Wt Readings from Last 3 Encounters:  02/13/24 112 lb (50.8 kg)  02/05/24 113 lb 8 oz (51.5 kg)  02/01/24 112 lb (50.8 kg)    Physical Exam Constitutional:      General: She is not in acute distress.    Appearance: Normal appearance. She is normal weight. She is not ill-appearing, toxic-appearing or diaphoretic.  HENT:     Head: Normocephalic.     Right Ear: Tympanic membrane normal. There is impacted cerumen.     Left Ear: Tympanic membrane normal.     Nose: Nose normal.     Mouth/Throat:     Mouth: Mucous membranes are dry.     Dentition: Has dentures.     Pharynx: No oropharyngeal exudate or posterior oropharyngeal erythema.  Eyes:     Extraocular Movements: Extraocular movements intact.     Pupils: Pupils are equal, round, and reactive to light.  Cardiovascular:     Rate and Rhythm: Normal rate. Rhythm irregularly irregular.     Pulses: Normal pulses.     Heart sounds: Normal  heart sounds.  Pulmonary:     Effort: Pulmonary effort is normal.     Breath sounds: Normal breath sounds.  Musculoskeletal:     Cervical back: Normal range of motion.     Right lower leg: 1+ Edema present.     Left lower leg: 1+ Edema present.  Neurological:     General: No focal deficit present.     Mental Status: She is alert and oriented to person, place, and time. Mental status is at baseline.     Coordination: Coordination is intact.     Gait: Gait is intact.  Psychiatric:        Mood and Affect: Mood normal.        Behavior: Behavior normal.        Thought  Content: Thought content normal.        Judgment: Judgment normal.          Results Diagnostic EKG (02/2023): Atrial premature complexes (Independently interpreted) Carotid ultrasound: Right carotid artery stenosis 1-39% with non-hemodynamically significant plaque less than 50% in the common carotid artery; left carotid artery stenosis 40-59% with non-hemodynamically significant plaque less than 50% in the common carotid artery; external carotid artery greater than 50% stenosed Echocardiogram (2022): Mild concentric left ventricular hypertrophy, ejection fraction 50-55%, grade 1 diastolic dysfunction, mild tricuspid regurgitation Ankle-brachial index (2023): Moderate right lower extremity arterial disease, right toe brachial index abnormal, mild left lower extremity arterial disease  Electrocardiogram (EKG) T wave inversion in V1. V2 and V3 normal. Heart rate 72 beats per minute. Normal sinus rhythm. Nonspecific T wave abnormality in V2. Anterior lateral leads without abnormality. Baseline longer in V2, V6.  Assessment & Plan:   Assessment and Plan Assessment & Plan Vertigo Chronic vertigo with recent exacerbation. No current symptoms of room spinning. Right ear wax may contribute to dizziness. - Prescribed meclizine  12.5 mg, take one as needed with sedative precautions and increased fall risk precautions. -rise slowly upon standing.  -suspect instigated by impacted cerum  -f/u if no improvement   Premature atrial contractions Suspected based on auscultation. Previous EKG in January 2025 showed PACs. Current EKG shows normal sinus rhythm with nonspecific T wave abnormality in V2.  Cardiac arrhythmia Irregular heartbeat noted on auscultation. Previous EKG showed PACs. Current EKG shows normal sinus rhythm with nonspecific T wave abnormality in V2. - Ordered EKG to rule out atrial fibrillation.  Carotid artery stenosis Right carotid stenosis at 1-39% and left carotid with 40-59%  stenosis. Non-hemodynamically significant plaque less than 50% in CCA. ECA appears greater than 50% stenosed.  Lower extremity arterial disease Moderate right lower extremity arterial disease with abnormal right toe brachial index. Mild left lower extremity arterial disease.  Left ventricular hypertrophy and diastolic dysfunction Mild concentric hypertrophy of the left ventricle with ejection fraction 50-55%. Grade one diastolic dysfunction. Mild tricuspid regurgitation.  Right impacted cerumen  Ceruminosis is noted.  Obtained verbal patient consent prior to procedure, possible risks of procedure discussed with pt prior, and then Wax was removed by syringing/irrigation and manual debridement was performed by me with curette. Instructions for home care to prevent wax buildup are given and handout provided to pt .pt tolerated procedure well.        Return for f/u PCP if no improvement in symptoms.     Ginger Patrick, MSN, APRN, FNP-C [facility]        [1]  Current Outpatient Medications:    acetaminophen  (TYLENOL ) 650 MG CR tablet, Take 650 mg  by mouth every 8 (eight) hours as needed for pain., Disp: , Rfl:    albuterol  (VENTOLIN  HFA) 108 (90 Base) MCG/ACT inhaler, USE 2 INHALATIONS BY MOUTH EVERY 6 HOURS AS NEEDED FOR WHEEZING  OR SHORTNESS OF BREATH, Disp: 34 g, Rfl: 2   alendronate  (FOSAMAX ) 70 MG tablet, TAKE 1 TABLET BY MOUTH WEEKLY  WITH 8 OZ OF PLAIN WATER 30  MINUTES BEFORE FIRST FOOD, DRINK OR MEDS. STAY UPRIGHT FOR 30  MINS, Disp: 12 tablet, Rfl: 3   aspirin  EC 81 MG tablet, Take 1 tablet (81 mg total) by mouth daily. Swallow whole., Disp: 90 tablet, Rfl: 3   Cholecalciferol  (VITAMIN D3) 25 MCG (1000 UT) CAPS, Take 1 capsule (1,000 Units total) by mouth daily., Disp: , Rfl:    cyanocobalamin  (VITAMIN B12) 1000 MCG tablet, Take 1 tablet (1,000 mcg total) by mouth daily., Disp: , Rfl:    diclofenac  Sodium (VOLTAREN ) 1 % GEL, Apply 2 g topically 4  (four) times daily., Disp: 50 g, Rfl: 0   ipratropium-albuterol  (DUONEB) 0.5-2.5 (3) MG/3ML SOLN, Take 3 mLs by nebulization every 6 (six) hours as needed., Disp: 360 mL, Rfl: 1   Iron , Ferrous Sulfate , 325 (65 Fe) MG TABS, Take 325 mg by mouth daily., Disp: , Rfl:    levothyroxine  (SYNTHROID ) 50 MCG tablet, TAKE 1 TABLET BY MOUTH DAILY  EXCEPT 2 TABLETS ON SUNDAYS AND  WEDNESDAYS ( TOTAL 9 TABS PER  WEEK), Disp: 130 tablet, Rfl: 2   meclizine  (ANTIVERT ) 12.5 MG tablet, Take 1 tablet (12.5 mg total) by mouth 3 (three) times daily as needed for dizziness., Disp: 30 tablet, Rfl: 0   melatonin 5 MG TABS, Take 5 mg by mouth at bedtime as needed (For sleep)., Disp: , Rfl:    methocarbamol  (ROBAXIN ) 500 MG tablet, TAKE 1 TABLET BY MOUTH TWICE DAILY AS NEEDED FOR MUSCLE SPASM, Disp: 60 tablet, Rfl: 0   pantoprazole  (PROTONIX ) 40 MG tablet, Take 1 tablet (40 mg total) by mouth 2 (two) times daily., Disp: 180 tablet, Rfl: 3   rosuvastatin  (CRESTOR ) 20 MG tablet, TAKE 1 TABLET BY MOUTH DAILY, Disp: 100 tablet, Rfl: 1   SYMBICORT  160-4.5 MCG/ACT inhaler, USE 2 INHALATIONS BY MOUTH IN  THE MORNING AND AT BEDTIME .  RINSE AFTER USE, Disp: 30.6 g, Rfl: 3   Tiotropium Bromide  (SPIRIVA  HANDIHALER) 18 MCG CAPS, Place 1 Inhalation into inhaler and inhale daily. INHALE THE CONTENTS OF 1 CAPSULE BY MOUTH VIA INHALATION DEVICE  DAILY, Disp: 100 capsule, Rfl: 3   rivaroxaban  (XARELTO ) 2.5 MG TABS tablet, Held as of 01/24/24 (Patient not taking: Reported on 02/13/2024), Disp: , Rfl:    sucralfate  (CARAFATE ) 1 g tablet, Take 1 tablet (1 g total) by mouth 4 (four) times daily -  with meals and at bedtime. (Patient not taking: Reported on 02/13/2024), Disp: 40 tablet, Rfl: 0  "

## 2024-02-14 ENCOUNTER — Telehealth: Payer: Self-pay

## 2024-02-14 NOTE — Telephone Encounter (Signed)
 Copied from CRM #[id number]. Topic: Clinical - Medical Advice >> Feb 13, 2024  3:09 PM Taleah C wrote: Reason for CRM: pt called back and stated that lindsay gave her a number for heartcare but the number isn't working. She asked for her to clarify the number again. Please call and advise.

## 2024-02-16 NOTE — Telephone Encounter (Signed)
 Called patient and gave updated number. They will call if any issues.

## 2024-02-19 ENCOUNTER — Ambulatory Visit: Admitting: Family

## 2024-02-19 ENCOUNTER — Encounter: Payer: Self-pay | Admitting: Family

## 2024-02-19 ENCOUNTER — Ambulatory Visit: Payer: Self-pay | Admitting: Family

## 2024-02-19 ENCOUNTER — Telehealth (INDEPENDENT_AMBULATORY_CARE_PROVIDER_SITE_OTHER): Admitting: Family

## 2024-02-19 ENCOUNTER — Other Ambulatory Visit: Payer: Self-pay | Admitting: Family Medicine

## 2024-02-19 VITALS — BP 152/86 | HR 100 | Temp 97.7°F | Ht 64.0 in | Wt 112.0 lb

## 2024-02-19 DIAGNOSIS — Z79899 Other long term (current) drug therapy: Secondary | ICD-10-CM

## 2024-02-19 DIAGNOSIS — Z5181 Encounter for therapeutic drug level monitoring: Secondary | ICD-10-CM

## 2024-02-19 DIAGNOSIS — I739 Peripheral vascular disease, unspecified: Secondary | ICD-10-CM | POA: Diagnosis not present

## 2024-02-19 DIAGNOSIS — D649 Anemia, unspecified: Secondary | ICD-10-CM | POA: Diagnosis not present

## 2024-02-19 LAB — CBC
HCT: 40.1 % (ref 36.0–46.0)
Hemoglobin: 12.6 g/dL (ref 12.0–15.0)
MCHC: 31.3 g/dL (ref 30.0–36.0)
MCV: 80.1 fl (ref 78.0–100.0)
Platelets: 204 K/uL (ref 150.0–400.0)
RBC: 5.01 Mil/uL (ref 3.87–5.11)
RDW: 25.1 % — ABNORMAL HIGH (ref 11.5–15.5)
WBC: 4.7 K/uL (ref 4.0–10.5)

## 2024-02-19 LAB — POTASSIUM: Potassium: 3.8 meq/L (ref 3.5–5.1)

## 2024-02-19 MED ORDER — RIVAROXABAN 2.5 MG PO TABS: ORAL_TABLET | ORAL | Status: AC

## 2024-02-19 NOTE — Progress Notes (Signed)
 "  Established Patient Office Visit  Subjective:      CC:  Chief Complaint  Patient presents with   Follow-up    HPI: Jill Franco is a 89 y.o. female presenting on 02/19/2024 for Follow-up .  Discussed the use of AI scribe software for clinical note transcription with the patient, who gave verbal consent to proceed.  History of Present Illness Jill Franco is an 89 year old female with heart failure who presents for follow-up regarding her symptoms and medication management.  Her dizziness has resolved after taking a single dose of meclizine  and having ear wax removed. She has not experienced dizziness since the treatment.  She has a history of heart failure and reports swelling in her lower extremities. She notes that the swelling is still present, with some days worse than others, and only a little better than previously. She was previously taken off furosemide  but has resumed taking 20 mg daily due to increased swelling. The swelling is more pronounced in the right leg than the left, and she has been elevating her legs to help reduce it. No increase in shortness of breath.  She is currently taking furosemide  20 mg daily. Her weight is stable at 110 pounds. She is also on Xarelto , which she restarted on her own after a previous hold due to a GI bleed. She monitors for signs of bleeding, such as dark stools, which she describes currently as dark brown but not black.  She experiences chronic pain in her right leg due to vein issues and arthritis, particularly in the knee. She has received a knee injection in the past, which provided only temporary relief. She uses topical treatments to manage the pain and does not wear compression stockings consistently.  No current shortness of breath or chest pain. Reports dark brown stools but no black stools, and no recent epistaxis.   Wt Readings from Last 3 Encounters:  02/19/24 112 lb (50.8 kg)  02/13/24 112 lb (50.8 kg)  02/05/24  113 lb 8 oz (51.5 kg)          Social history:  Relevant past medical, surgical, family and social history reviewed and updated as indicated. Interim medical history since our last visit reviewed.  Allergies and medications reviewed and updated.  DATA REVIEWED: CHART IN EPIC     ROS: Negative unless specifically indicated above in HPI.   Current Medications[1]        Objective:        BP (!) 152/86 (BP Location: Left Arm, Patient Position: Sitting, Cuff Size: Normal)   Pulse 100   Temp 97.7 F (36.5 C) (Temporal)   Ht 5' 4 (1.626 m)   Wt 112 lb (50.8 kg)   SpO2 99%   BMI 19.22 kg/m   Physical Exam VITALS: P- 72 MEASUREMENTS: Weight- 110. CHEST: Lungs clear to auscultation.  Wt Readings from Last 3 Encounters:  02/19/24 112 lb (50.8 kg)  02/13/24 112 lb (50.8 kg)  02/05/24 113 lb 8 oz (51.5 kg)    Physical Exam Constitutional:      General: She is not in acute distress.    Appearance: Normal appearance. She is normal weight. She is not ill-appearing, toxic-appearing or diaphoretic.  HENT:     Head: Normocephalic.  Cardiovascular:     Rate and Rhythm: Normal rate and regular rhythm.  Pulmonary:     Effort: Pulmonary effort is normal.     Breath sounds: Normal breath sounds.  Musculoskeletal:  General: Normal range of motion.     Right lower leg: 2+ Pitting Edema present.     Left lower leg: 1+ Edema present.  Neurological:     General: No focal deficit present.     Mental Status: She is alert and oriented to person, place, and time. Mental status is at baseline.  Psychiatric:        Mood and Affect: Mood normal.        Behavior: Behavior normal.        Thought Content: Thought content normal.        Judgment: Judgment normal.          Results Labs BNP (01/2024): Elevated  Diagnostic EKG (01/2024): Normal sinus rhythm, nonspecific T wave abnormality in V2 anterior lateral leads, baseline prolongation in V2 and V6, no  abnormality in V1-V3, heart rate 72 beats per minute, premature atrial complexes, anterior Q waves (noted in January) (Independently interpreted)  Assessment & Plan:   Assessment and Plan Assessment & Plan Heart failure with peripheral edema Heart failure with peripheral edema, primarily affecting the right lower extremity. BNP levels are elevated, indicating fluid overload. Swelling has improved with furosemide  but persists. No significant shortness of breath reported. Monitoring of fluid status and weight is crucial to prevent heart failure exacerbation. - Continue furosemide  20 mg daily until Saturday, then use as needed based on swelling and shortness of breath. - Monitor weight daily; report any weight gain of more than 2 pounds in one day or 3-5 pounds in one week. - Repeated potassium level today to monitor for potential hypokalemia due to furosemide  use. - Scheduled follow-up with cardiologist on January 26th. - Scheduled follow-up with vein specialist on January 6th. -reached out to PCP to advise if she should still hold xarelto    Peripheral artery disease Moderate right lower extremity arterial disease with abnormal right toe brachial index. Swelling and pain in the right leg, likely exacerbated by venous insufficiency and osteoarthritis. - Continue wearing compression stockings as tolerated. - Monitor for any increase in pain or swelling.  Cardiac arrhythmia (premature atrial complexes) Premature atrial complexes noted on EKG. No significant symptoms reported. Current EKG shows normal sinus rhythm with nonspecific T wave abnormalities. Discussion with GI regarding potential impact on upcoming endoscopy. - Continue current management and monitor for any new symptoms such as palpitations or chest pain. - Instructed to discuss EKG findings with GI prior to endoscopy on January 12th.  Osteoarthritis of right knee Chronic pain in the right knee due to osteoarthritis. Previous knee  injection provided temporary relief.        Return in about 2 weeks (around 03/04/2024) for f/u with Dr. Cleatus for pedal edema .     Ginger Patrick, MSN, APRN, FNP-C [facility]        [1]  Current Outpatient Medications:    acetaminophen  (TYLENOL ) 650 MG CR tablet, Take 650 mg by mouth every 8 (eight) hours as needed for pain., Disp: , Rfl:    albuterol  (VENTOLIN  HFA) 108 (90 Base) MCG/ACT inhaler, USE 2 INHALATIONS BY MOUTH EVERY 6 HOURS AS NEEDED FOR WHEEZING  OR SHORTNESS OF BREATH, Disp: 34 g, Rfl: 2   alendronate  (FOSAMAX ) 70 MG tablet, TAKE 1 TABLET BY MOUTH WEEKLY  WITH 8 OZ OF PLAIN WATER 30  MINUTES BEFORE FIRST FOOD, DRINK OR MEDS. STAY UPRIGHT FOR 30  MINS, Disp: 12 tablet, Rfl: 3   aspirin  EC 81 MG tablet, Take 1 tablet (81 mg  total) by mouth daily. Swallow whole., Disp: 90 tablet, Rfl: 3   Cholecalciferol  (VITAMIN D3) 25 MCG (1000 UT) CAPS, Take 1 capsule (1,000 Units total) by mouth daily., Disp: , Rfl:    cyanocobalamin  (VITAMIN B12) 1000 MCG tablet, Take 1 tablet (1,000 mcg total) by mouth daily., Disp: , Rfl:    diclofenac  Sodium (VOLTAREN ) 1 % GEL, Apply 2 g topically 4 (four) times daily., Disp: 50 g, Rfl: 0   furosemide  (LASIX ) 20 MG tablet, Take one po every day for five days, then take one po every day prn edema, Disp: 30 tablet, Rfl: 0   ipratropium-albuterol  (DUONEB) 0.5-2.5 (3) MG/3ML SOLN, Take 3 mLs by nebulization every 6 (six) hours as needed., Disp: 360 mL, Rfl: 1   Iron , Ferrous Sulfate , 325 (65 Fe) MG TABS, Take 325 mg by mouth daily., Disp: , Rfl:    levothyroxine  (SYNTHROID ) 50 MCG tablet, TAKE 1 TABLET BY MOUTH DAILY  EXCEPT 2 TABLETS ON SUNDAYS AND  WEDNESDAYS ( TOTAL 9 TABS PER  WEEK), Disp: 130 tablet, Rfl: 2   meclizine  (ANTIVERT ) 12.5 MG tablet, Take 1 tablet (12.5 mg total) by mouth 3 (three) times daily as needed for dizziness., Disp: 30 tablet, Rfl: 0   melatonin 5 MG TABS, Take 5 mg by mouth at bedtime as needed  (For sleep)., Disp: , Rfl:    methocarbamol  (ROBAXIN ) 500 MG tablet, TAKE 1 TABLET BY MOUTH TWICE DAILY AS NEEDED FOR MUSCLE SPASM, Disp: 60 tablet, Rfl: 0   pantoprazole  (PROTONIX ) 40 MG tablet, Take 1 tablet (40 mg total) by mouth 2 (two) times daily., Disp: 180 tablet, Rfl: 3   rosuvastatin  (CRESTOR ) 20 MG tablet, TAKE 1 TABLET BY MOUTH DAILY, Disp: 100 tablet, Rfl: 1   SYMBICORT  160-4.5 MCG/ACT inhaler, USE 2 INHALATIONS BY MOUTH IN  THE MORNING AND AT BEDTIME .  RINSE AFTER USE, Disp: 30.6 g, Rfl: 3   Tiotropium Bromide  (SPIRIVA  HANDIHALER) 18 MCG CAPS, Place 1 Inhalation into inhaler and inhale daily. INHALE THE CONTENTS OF 1 CAPSULE BY MOUTH VIA INHALATION DEVICE  DAILY, Disp: 100 capsule, Rfl: 3   rivaroxaban  (XARELTO ) 2.5 MG TABS tablet, Held as of 01/24/24, Disp: , Rfl:   "

## 2024-02-19 NOTE — Telephone Encounter (Signed)
 Pt came off of her xarelto - was held by Dr. Cleatus back early December because of GI bleed. It is unclear with notes on whether to restart or not. She restarted on her own because she was worried about her heart. She is still set up for EGD 1/12 to evaluate this further, I wanted to double check with Dr. D to see if this is ok to continue on with recent bleed.

## 2024-02-20 ENCOUNTER — Encounter (HOSPITAL_COMMUNITY): Payer: Self-pay | Admitting: Internal Medicine

## 2024-02-20 NOTE — Telephone Encounter (Addendum)
 Dr Cleatus  I got a message from the hospital stating that the patient resumed her Xarelto  medication and we are asking if you are willing to state if the patient can be off her Xarelto  2 days prior to her procedure. Please advise

## 2024-02-20 NOTE — Progress Notes (Signed)
 Jill Franco   PCP-Duncan MD Cardiologist-n/a Pulmonologist-n/a  EKG-02/13/24 Echo-02/27/23 Cath-   [facility]     Stress-n/a ICD/PM-n/a GLP1-n/a Blood Thinner-Xarelto   (was taken off for GI bleed has restarted unsure when to stop will contact office on hold days)  History: HTN,Stroke, COPD, PVD, Iliac stent, HF. Patient last saw her pcp 12/29 was sent to cardiology by them due to HF with peripheral edema. Is due to see cardiologist 03/18/24, per patient not having any other cardiac symptoms denies chest pains/sob/palpitations.   Anesthesia Review- Yes- okay to proceed hgb stable

## 2024-02-25 NOTE — Telephone Encounter (Signed)
 If patient is tolerating xarelto  in the meantime, then I would continue as is but I would hold it for 2 days prior to procedure.  Thanks.

## 2024-02-25 NOTE — Telephone Encounter (Signed)
 Noted, thanks.   If patient is tolerating xarelto  in the meantime, then I would continue as is but I would hold it for 2 days prior to procedure.  Thanks.

## 2024-02-26 ENCOUNTER — Telehealth: Payer: Self-pay | Admitting: Gastroenterology

## 2024-02-26 NOTE — Telephone Encounter (Signed)
 Noted. Thanks.

## 2024-02-26 NOTE — Telephone Encounter (Signed)
"  See telephone encounter 02/19/24  "

## 2024-02-26 NOTE — Telephone Encounter (Signed)
 Spoke with pt. She is aware of stop taking her Xarelto  2 days prior to procedure. Pt also states that she had bright red blood when she had a BM and it only occurred ones. Please advise.

## 2024-02-26 NOTE — Telephone Encounter (Addendum)
 Procedure:Endoscopy Procedure date: 03/04/24 Procedure location: [facility] Arrival Time: 7:00 am Spoke with the patient Y/N: Yes Any prep concerns? No  Has the patient obtained the prep from the pharmacy ? No prep needed Do you have a care partner and transportation: Yes Any additional concerns? No

## 2024-02-26 NOTE — Progress Notes (Signed)
 [facility] [place]  ASSESSMENT / PLAN: Jill Franco is a 89 y.o. female with atherosclerosis of native and stented aortoiliac occlusive disease as well as bilateral lower extremity arteries initially causing causing disabling claudication. Now status post left femoral endarterectomy and profundaplasty, complete endovascular reconstruction of aortic bifurcation (CERAB) 12/03/20. This required endovascular revision 04/30/21 for mechanical compression of the iliac stenting.   Recommend the following which can slow the progression of atherosclerosis and reduce the risk of major adverse cardiac / limb events:  Complete cessation from all tobacco products. Blood glucose control with goal A1c < 7%. Blood pressure control with goal blood pressure < 140/90 mmHg. Lipid reduction therapy with goal LDL-C <100 mg/dL (<29 if symptomatic from PAD).  Aspirin  81mg  PO QD.  Atorvastatin  40-80mg  PO QD (or other high intensity statin therapy).  Plan bilateral lower extremity endovascular intervention via open left brachial approach for claudication. Will need general anesthetic Will plan to do this after cardiology evaluation at the end of this month.   CHIEF COMPLAINT: Calf pain with walking  HISTORY OF PRESENT ILLNESS: Jill Franco is a 89 y.o. female who returns to clinic for surveillance of peripheral arterial disease.  She reports deteriorating claudication symptoms.  She is now disabled by her symptoms.  She can only walk a short distance before severe pain sets in bilateral calves.  She has no wounds about her feet.  She occasionally has pain in her foot at rest, but does not report symptoms typical of ischemic rest pain.  She continues to smoke, but is working hard to quit.  She is using patches with some success.  10/27/20: patient returns after evaluation by Dr. Ladona. Pending stress test and echo. No interval changes.  01/11/21: Patient returns after hybrid  revascularization.  She reports complete symptomatic relief of claudication.  She is walking as far as she likes.  She is appreciative.   04/20/21: Patient returns for follow-up evaluation.  She has had return of severe symptoms in her bilateral lower extremities, left worse than right.  She endorses early rest pain symptoms in her left foot.  She has short distance, disabling claudication in both calves.  06/08/21: Patient returns to clinic.  After intervention, the patient had early relief of her symptoms, but they have now returned.  We had a long discussion about our options going forward.  She is maximally medically managed for peripheral arterial disease.  Her symptoms are not yet disabling to her.  09/07/21: patient returns to clinic and reports his symptoms have improved. She has started on Xarelto . She attributes improvement in her symptoms to Xarelto .   09/26/23: Patient presents to clinic to review CT angiogram results.  In brief, she had been under surveillance with our group's physician assistants, duplex noted left external iliac artery stenosis versus in-stent stenosis.  CT angiogram was performed.  This does show some mild stenosis in the left external iliac stent as well as in the distal left external iliac artery which is unstented.  I reviewed these results with her in detail.  I counseled her that endovascular intervention would require a general anesthetic and left brachial artery access.  She does have some claudication type symptoms, but not enough in her mind to warrant reintervention.  I think this is reasonable given her advanced age and the requirement for general anesthetic for intervention.  We ultimately agreed to monitor her with close serial follow-up.  12/26/23: Patient returns to clinic for follow-up.  She  is highly symptomatic in both legs with claudication symptoms.  I counseled her that her stenting appears patent, and she would likely need infrainguinal bypass to address her  symptoms completely.  This would put her at some risk given her advanced age and advanced atherosclerosis.  I suggested that we start with a CT angiogram to see what her surgical anatomy might entail and to make a more definite plan for intervention if this is how she would like to proceed.  02/27/24: Patient returns clinic for follow-up after CT angiogram.  She still reports bothersome claudication symptoms and desires intervention.  She is due to see cardiology group at the end of the month.  I counseled her that endovascular mention may be possible for both of her legs.  I counseled her that a brachial approach would be best.  VASCULAR SURGICAL HISTORY:  03/18/16 -- left common iliac stent (6 x 40), terminal aortic stent (10 x 19)  (Fields)  12/03/20 --  1) US  guided right common femoral artery access 2) Aortogram and pelvic angiogram 3) Terminal aortic stenting (11x23mm VBX post dilated to ~48mm) 4) Right common iliac artery stenting (7x87mm VBX post dilated to 9mm proximally) 5) Left common iliac artery stenting (7x77mm VBX post dilated to 9mm proximally) 6) left external iliac artery stenting (7x62mm viabahn) 7) left femoral endarterectomy, profundaplasty, and patch angioplasty (bovine pericardium)  04/30/21 -- Angioplasty of bilateral common iliac artery stents (7x20mm Mustang) Left external iliac artery stenting (7x40mm Eluvia)  VASCULAR RISK FACTORS: Positive history of stroke / transient ischemic attack. Negative history of coronary artery disease.  Negative history of diabetes mellitus. Last A1c 5.7. Positive history of smoking. Not actively smoking. Positive history of hypertension.  No reported history of chronic kidney disease but last GFR 58-73. CKD stage 2 - 3a. Negative history of chronic obstructive pulmonary disease, but patient has been treated with inhalers with symptomatic relief.   FUNCTIONAL STATUS: ECOG performance status: (1) Restricted in physically strenuous  activity, ambulatory and able to do work of light nature Ambulatory status: Ambulatory within the community with limits  Past Medical History:  Diagnosis Date   Cataract    bilaterally corrected.   COPD (chronic obstructive pulmonary disease) (HCC)    CVA (cerebral vascular accident) (HCC)    Pt was unaware and does not know when it happened.  Was told after some testing was done.   Double vision    Dyspnea    Facial droop 10/2017   left   History of shingles 2012   HLD (hyperlipidemia)    HTN (hypertension)    Hypothyroidism    PVD (peripheral vascular disease)    Vision loss    temporary    Past Surgical History:  Procedure Laterality Date   ABDOMINAL AORTOGRAM N/A 04/30/2021   Procedure: ABDOMINAL AORTOGRAM;  Surgeon: Magda Debby SAILOR, MD;  Location: [facility];  Service: Cardiovascular;  Laterality: N/A;   ABDOMINAL EXPLORATION SURGERY  1950s   they thought I was pregnant; went in to explore   APPENDECTOMY  childhood   BACK SURGERY     BREAST LUMPECTOMY Left 1960's   benign   carotid ultrasound  06/19/2007   0-39% stenosis   CATARACT EXTRACTION W/ INTRAOCULAR LENS IMPLANT Left 09/2016   CATARACT EXTRACTION W/ INTRAOCULAR LENS IMPLANT Right 10/2016   COLONOSCOPY WITH PROPOFOL  N/A 04/08/2019   Procedure: COLONOSCOPY WITH PROPOFOL ;  Surgeon: Teressa Toribio SQUIBB, MD;  Location: [facility];  Service: Endoscopy;  Laterality: N/A;   DILATION  AND CURETTAGE OF UTERUS  1960's X 3   miscarriages   DOPPLER ECHOCARDIOGRAPHY  06/19/2007   Normal EF 55-70%   ENDARTERECTOMY FEMORAL Left 12/03/2020   Procedure: LEFT FEMORAL ENDARTERECTOMY AND PROFUNDAPLASTY;  Surgeon: Magda Debby SAILOR, MD;  Location: [facility];  Service: Vascular;  Laterality: Left;   ILIAC ARTERY STENT Left 03/18/2016   common iliac stent (6 x 40), aortic stent (10 x 19)/notes 03/18/2016   INSERTION OF ILIAC STENT Left 12/03/2020   Procedure: AORTIC STENTING, INSERTION OF BILATERAL COMMON ILIAC STENTS, AND LEFT  EXTERNAL ILIAC STENT;  Surgeon: Magda Debby SAILOR, MD;  Location: [facility];  Service: Vascular;  Laterality: Left;   LAMINECTOMY AND MICRODISCECTOMY SPINE  1960's X 2   LOWER EXTREMITY ANGIOGRAM N/A 12/03/2020   Procedure: REUBIN BILE;  Surgeon: Magda Debby SAILOR, MD;  Location: [facility];  Service: Vascular;  Laterality: N/A;   MOLE REMOVAL  07/10/2016   NSVD     x1   PATCH ANGIOPLASTY Left 12/03/2020   Procedure: PATCH ANGIOPLASTY OF LEFT COMMON FEMORAL ARTERY USING BOVINE PERICARDIUM PATCH;  Surgeon: Magda Debby SAILOR, MD;  Location: [facility];  Service: Vascular;  Laterality: Left;   PERIPHERAL VASCULAR BALLOON ANGIOPLASTY Right 04/30/2021   Procedure: PERIPHERAL VASCULAR BALLOON ANGIOPLASTY;  Surgeon: Magda Debby SAILOR, MD;  Location: [facility];  Service: Cardiovascular;  Laterality: Right;  rt common iliac   PERIPHERAL VASCULAR CATHETERIZATION N/A 03/18/2016   Procedure: Abdominal Aortogram w/Lower Extremity;  Surgeon: Carlin FORBES Haddock, MD;  Location: [facility] INVASIVE CV LAB;  Service: Cardiovascular;  Laterality: N/A;   PERIPHERAL VASCULAR CATHETERIZATION  03/18/2016   Procedure: Peripheral Vascular Intervention;  Surgeon: Carlin FORBES Haddock, MD;  Location: [facility] INVAS[facility] CV LAB;  Service: Cardiovascular;;   PERIPHERAL VASCULAR INTERVENTION Left 04/30/2021   Procedure: PERIPHERAL VASCULAR INTERVENTION;  Surgeon: Magda Debby SAILOR, MD;  Location: [facility];  Service: Cardiovascular;  Laterality: Left;  lt common iliac   POLYPECTOMY  04/08/2019   Procedure: POLYPECTOMY;  Surgeon: Teressa Toribio SQUIBB, MD;  Location: [facility];  Service: Endoscopy;;   ULTRASOUND GUIDANCE FOR VASCULAR ACCESS Right 12/03/2020   Procedure: ULTRASOUND GUIDANCE FOR VASCULAR ACCESS, RIGHT FEMORAL ARTERY;  Surgeon: Magda Debby SAILOR, MD;  Location: [facility];  Service: Vascular;  Laterality: Right;    Family History  Problem Relation Age of Onset   Stroke Father 37   Cancer Brother    Colon cancer Neg Hx    Breast  cancer Neg Hx    Colon polyps Neg Hx    Esophageal cancer Neg Hx    Rectal cancer Neg Hx    Stomach cancer Neg Hx     Social History   Socioeconomic History   Marital status: Widowed    Spouse name: Not on file   Number of children: 1   Years of education: Not on file   Highest education level: Not on file  Occupational History   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  Tobacco Use   Smoking status: Former    Current packs/day: 0.00    Average packs/day: 0.3 packs/day for 69.0 years (17.3 ttl pk-yrs)    Types: Cigarettes    Start date: 01/1952    Quit date: 01/2021    Years since quitting: 3.1    Passive exposure: Current   Smokeless tobacco: Never  Vaping Use   Vaping status: Never Used  Substance and Sexual Activity   Alcohol use: No    Alcohol/week: 0.0 standard  drinks of alcohol   Drug use: No   Sexual activity: Not on file  Other Topics Concern   Not on file  Social History Narrative   From [place]   Retired 2007   Widowed 2008 after 49 years   Enjoys yard work, but needs some help with yard work now.     Her daughter is helping at home some (some as of 2022).  Her daughter has vision loss and is widowed as of 2022 and moved in with patient.   Social Drivers of Health   Tobacco Use: Medium Risk (02/27/2024)   Patient History    Smoking Tobacco Use: Former    Smokeless Tobacco Use: Never    Passive Exposure: Current  Physicist, Medical Strain: Low Risk (07/19/2023)   Overall Financial Resource Strain (CARDIA)    Difficulty of Paying Living Expenses: Not hard at all  Food Insecurity: No Food Insecurity (07/19/2023)   Hunger Vital Sign    Worried About Running Out of Food in the Last Year: Never true    Ran Out of Food in the Last Year: Never true  Transportation Needs: No Transportation Needs (07/19/2023)   PRAPARE - Administrator, Civil Service (Medical): No    Lack of Transportation (Non-Medical): No  Physical Activity:  Insufficiently Active (07/19/2023)   Exercise Vital Sign    Days of Exercise per Week: 3 days    Minutes of Exercise per Session: 30 min  Stress: No Stress Concern Present (07/19/2023)   [employer] of Occupational Health - Occupational Stress Questionnaire    Feeling of Stress : Not at all  Social Connections: Socially Isolated (07/19/2023)   Social Connection and Isolation Panel    Frequency of Communication with Friends and Family: More than three times a week    Frequency of Social Gatherings with Friends and Family: More than three times a week    Attends Religious Services: Never    Database Administrator or Organizations: No    Attends Banker Meetings: Never    Marital Status: Widowed  Intimate Partner Violence: Not At Risk (07/19/2023)   Humiliation, Afraid, Rape, and Kick questionnaire    Fear of Current or Ex-Partner: No    Emotionally Abused: No    Physically Abused: No    Sexually Abused: No  Depression (PHQ2-9): Low Risk (02/01/2024)   Depression (PHQ2-9)    PHQ-2 Score: 0  Alcohol Screen: Low Risk (07/19/2023)   Alcohol Screen    Last Alcohol Screening Score (AUDIT): 0  Housing: Low Risk (07/19/2023)   Housing Stability Vital Sign    Unable to Pay for Housing in the Last Year: No    Number of Times Moved in the Last Year: 0    Homeless in the Last Year: No  Utilities: Not At Risk (07/19/2023)   AHC Utilities    Threatened with loss of utilities: No  Health Literacy: Adequate Health Literacy (07/19/2023)   B1300 Health Literacy    Frequency of need for help with medical instructions: Never    Allergies  Allergen Reactions   Lipitor [Atorvastatin ] Other (See Comments)    myalgia   Advil [Ibuprofen] Nausea And Vomiting   Aspirin  Other (See Comments)    Uncoated aspirin  causes stomach upset on empty stomach   Codeine Nausea Only and Other (See Comments)    Pill needs to be E.coated   Other Itching and Rash    Nicotine  patches     Current  Outpatient Medications  Medication Sig Dispense Refill   acetaminophen  (TYLENOL ) 650 MG CR tablet Take 650 mg by mouth every 8 (eight) hours as needed for pain.     albuterol  (VENTOLIN  HFA) 108 (90 Base) MCG/ACT inhaler USE 2 INHALATIONS BY MOUTH EVERY 6 HOURS AS NEEDED FOR WHEEZING  OR SHORTNESS OF BREATH 34 g 2   alendronate  (FOSAMAX ) 70 MG tablet TAKE 1 TABLET BY MOUTH WEEKLY  WITH 8 OZ OF PLAIN WATER 30  MINUTES BEFORE FIRST FOOD, DRINK OR MEDS. STAY UPRIGHT FOR 30  MINS 12 tablet 3   aspirin  EC 81 MG tablet Take 1 tablet (81 mg total) by mouth daily. Swallow whole. 90 tablet 3   Cholecalciferol  (VITAMIN D3) 25 MCG (1000 UT) CAPS Take 1 capsule (1,000 Units total) by mouth daily.     cyanocobalamin  (VITAMIN B12) 1000 MCG tablet Take 1 tablet (1,000 mcg total) by mouth daily.     diclofenac  Sodium (VOLTAREN ) 1 % GEL Apply 2 g topically 4 (four) times daily. 50 g 0   furosemide  (LASIX ) 20 MG tablet Take one po every day for five days, then take one po every day prn edema 30 tablet 0   ipratropium-albuterol  (DUONEB) 0.5-2.5 (3) MG/3ML SOLN Take 3 mLs by nebulization every 6 (six) hours as needed. 360 mL 1   Iron , Ferrous Sulfate , 325 (65 Fe) MG TABS Take 325 mg by mouth daily.     levothyroxine  (SYNTHROID ) 50 MCG tablet TAKE 1 TABLET BY MOUTH DAILY  EXCEPT 2 TABLETS ON SUNDAYS AND  WEDNESDAYS ( TOTAL 9 TABS PER  WEEK) 130 tablet 2   meclizine  (ANTIVERT ) 12.5 MG tablet Take 1 tablet (12.5 mg total) by mouth 3 (three) times daily as needed for dizziness. 30 tablet 0   melatonin 5 MG TABS Take 5 mg by mouth at bedtime as needed (For sleep).     methocarbamol  (ROBAXIN ) 500 MG tablet TAKE 1 TABLET BY MOUTH TWICE DAILY AS NEEDED FOR MUSCLE SPASM 60 tablet 0   pantoprazole  (PROTONIX ) 40 MG tablet Take 1 tablet (40 mg total) by mouth 2 (two) times daily. 180 tablet 3   rivaroxaban  (XARELTO ) 2.5 MG TABS tablet Held as of 01/24/24     rosuvastatin  (CRESTOR ) 20 MG tablet TAKE 1 TABLET BY MOUTH DAILY 100  tablet 2   SYMBICORT  160-4.5 MCG/ACT inhaler USE 2 INHALATIONS BY MOUTH IN  THE MORNING AND AT BEDTIME .  RINSE AFTER USE 30.6 g 3   Tiotropium Bromide  (SPIRIVA  HANDIHALER) 18 MCG CAPS Place 1 Inhalation into inhaler and inhale daily. INHALE THE CONTENTS OF 1 CAPSULE BY MOUTH VIA INHALATION DEVICE  DAILY 100 capsule 3   No current facility-administered medications for this visit.    PHYSICAL EXAM Vitals:   02/27/24 1301  BP: (!) 146/67  Pulse: (!) 58  Temp: 97.9 F (36.6 C)  SpO2: 98%  Weight: 113 lb (51.3 kg)  Height: 5' 4 (1.626 m)    Elderly woman in no distress Regular rate and rhythm Unlabored breathing Palpable femoral pulses No palpable pedal pulses  PERTINENT LABORATORY AND RADIOLOGIC DATA  Most recent CBC    Latest Ref Rng & Units 02/19/2024   10:39 AM 02/13/2024    8:48 AM 02/01/2024   12:29 PM  CBC  WBC 4.0 - 10.5 K/uL 4.7  5.2  5.2   Hemoglobin 12.0 - 15.0 g/dL 87.3  87.1  87.6   Hematocrit 36.0 - 46.0 % 40.1  41.0  38.8   Platelets 150.0 -  400.0 K/uL 204.0  207.0  208.0      Most recent CMP    Latest Ref Rng & Units 02/19/2024   10:37 AM 02/13/2024    8:48 AM 02/01/2024   12:29 PM  CMP  Glucose 70 - 99 mg/dL  80  878   BUN 6 - 23 mg/dL  18  17   Creatinine 9.59 - 1.20 mg/dL  9.15  9.17   Sodium 864 - 145 mEq/L  139  137   Potassium 3.5 - 5.1 mEq/L 3.8  4.2  3.9   Chloride 96 - 112 mEq/L  104  100   CO2 19 - 32 mEq/L  26  31   Calcium  8.4 - 10.5 mg/dL  9.1  9.5     Renal function Estimated Creatinine Clearance: 38.2 mL/min (by C-G formula based on SCr of 0.84 mg/dL).  Hgb A1c MFr Bld (%)  Date Value  09/18/2020 5.7    LDL Cholesterol  Date Value Ref Range Status  11/21/2022 64 0 - 99 mg/dL Final   Direct LDL  Date Value Ref Range Status  06/19/2012 115.8 mg/dL Final    Comment:    Optimal:  <100 mg/dLNear or Above Optimal:  100-129 mg/dLBorderline High:  130-159 mg/dLHigh:  160-189 mg/dLVery High:  >190 mg/dL       Debby SAILOR.  Magda, MD [facility] Phone Number: [phone] 02/27/2024 4:25 PM

## 2024-02-26 NOTE — Telephone Encounter (Signed)
 If she has any more bleeding, then I would stop xarelto  and let us  know.  O/w would continue as is for now.  Thanks.

## 2024-02-26 NOTE — Telephone Encounter (Signed)
 Spoke with the patient and she said she would let us  know if it happens again. She also said she would stop the Xarelto  if the bleeding starts again and will give the office a call.

## 2024-02-27 ENCOUNTER — Telehealth (HOSPITAL_BASED_OUTPATIENT_CLINIC_OR_DEPARTMENT_OTHER): Payer: Self-pay | Admitting: *Deleted

## 2024-02-27 ENCOUNTER — Ambulatory Visit: Attending: Vascular Surgery | Admitting: Vascular Surgery

## 2024-02-27 ENCOUNTER — Encounter: Payer: Self-pay | Admitting: Vascular Surgery

## 2024-02-27 VITALS — BP 146/67 | HR 58 | Temp 97.9°F | Ht 64.0 in | Wt 113.0 lb

## 2024-02-27 DIAGNOSIS — I739 Peripheral vascular disease, unspecified: Secondary | ICD-10-CM

## 2024-02-27 NOTE — Telephone Encounter (Signed)
"  ° °  Pre-operative Risk Assessment    Patient Name: Jill Franco  DOB: July 22, 1934 MRN: [id number]   Date of last office visit: NONE Date of next office visit: 03/18/24 [facility]. [facility]   Request for Surgical Clearance    Procedure:  AORTOGRAM, BLE ANGIOGRAM, BLE INTERVENTION  Date of Surgery:  Clearance TBD ASAP                               Surgeon:  DR. MAGDA Surgeon's Group or Practice Name:  [facility] Phone number:  [phone] Fax number:  [phone]   Type of Clearance Requested:   - Medical    Type of Anesthesia:  General    Additional requests/questions:    [name]   02/27/2024, 3:27 PM   "

## 2024-02-27 NOTE — Telephone Encounter (Signed)
" ° °  Name: Jill Franco  DOB: 03/24/1934  MRN: [id number]  Primary Cardiologist: None  Chart reviewed as part of pre-operative protocol coverage. The patient has an upcoming visit scheduled with Dr. Ladona on 03/18/24 at which time clearance can be addressed in case there are any issues that would impact surgical recommendations.  Patient has not been seen in office since 06/2021 she will require an in office visit for further evaluation.   Aortogram is not scheduled as below. I added preop FYI to appointment note so that provider is aware to address at time of outpatient visit.  Per office protocol the cardiology provider should forward their finalized clearance decision and recommendations regarding antiplatelet therapy to the requesting party below.    I will route this message as FYI to requesting party and remove this message from the preop box as separate preop APP input not needed at this time.   Please call with any questions.  Armistead Sult D Jill Saunders, NP  02/27/2024, 3:34 PM   "

## 2024-02-28 ENCOUNTER — Telehealth: Payer: Self-pay

## 2024-02-28 NOTE — Telephone Encounter (Signed)
 Called patient reviewed all information and repeated back to me. Will call if any questions.  She will hold off on labs

## 2024-02-28 NOTE — Telephone Encounter (Signed)
 I think she can defer labs this week, assuming she is not clinically worse.  Thanks.

## 2024-02-28 NOTE — Telephone Encounter (Signed)
 Copied from CRM [id number]. Topic: Clinical - Request for Lab/Test Order >> Feb 27, 2024  2:40 PM Jayma L wrote: Reason for CRM: patient said she recently got labs done and want to know if they are needed this week or still. Please return her call at [phone]

## 2024-02-29 ENCOUNTER — Other Ambulatory Visit

## 2024-03-04 ENCOUNTER — Other Ambulatory Visit: Payer: Self-pay

## 2024-03-04 ENCOUNTER — Ambulatory Visit (HOSPITAL_COMMUNITY): Admitting: Anesthesiology

## 2024-03-04 ENCOUNTER — Ambulatory Visit (HOSPITAL_COMMUNITY)
Admission: RE | Admit: 2024-03-04 | Discharge: 2024-03-04 | Disposition: A | Discharge status: Home / Self Care | Attending: Internal Medicine | Admitting: Internal Medicine

## 2024-03-04 ENCOUNTER — Encounter (HOSPITAL_COMMUNITY): Payer: Self-pay | Admitting: Internal Medicine

## 2024-03-04 ENCOUNTER — Encounter (HOSPITAL_COMMUNITY)
Admission: RE | Disposition: A | Discharge status: Home / Self Care | Payer: Self-pay | Source: Home / Self Care | Attending: Internal Medicine

## 2024-03-04 ENCOUNTER — Ambulatory Visit: Payer: Self-pay

## 2024-03-04 DIAGNOSIS — Z87891 Personal history of nicotine dependence: Secondary | ICD-10-CM | POA: Diagnosis not present

## 2024-03-04 DIAGNOSIS — I10 Essential (primary) hypertension: Secondary | ICD-10-CM | POA: Insufficient documentation

## 2024-03-04 DIAGNOSIS — Z8673 Personal history of transient ischemic attack (TIA), and cerebral infarction without residual deficits: Secondary | ICD-10-CM | POA: Diagnosis not present

## 2024-03-04 DIAGNOSIS — J449 Chronic obstructive pulmonary disease, unspecified: Secondary | ICD-10-CM | POA: Diagnosis not present

## 2024-03-04 DIAGNOSIS — K317 Polyp of stomach and duodenum: Secondary | ICD-10-CM

## 2024-03-04 DIAGNOSIS — E039 Hypothyroidism, unspecified: Secondary | ICD-10-CM | POA: Insufficient documentation

## 2024-03-04 DIAGNOSIS — K921 Melena: Secondary | ICD-10-CM | POA: Diagnosis not present

## 2024-03-04 DIAGNOSIS — I739 Peripheral vascular disease, unspecified: Secondary | ICD-10-CM | POA: Diagnosis not present

## 2024-03-04 DIAGNOSIS — K31819 Angiodysplasia of stomach and duodenum without bleeding: Secondary | ICD-10-CM | POA: Diagnosis not present

## 2024-03-04 DIAGNOSIS — D509 Iron deficiency anemia, unspecified: Secondary | ICD-10-CM | POA: Insufficient documentation

## 2024-03-04 DIAGNOSIS — K219 Gastro-esophageal reflux disease without esophagitis: Secondary | ICD-10-CM | POA: Diagnosis not present

## 2024-03-04 DIAGNOSIS — D132 Benign neoplasm of duodenum: Secondary | ICD-10-CM | POA: Insufficient documentation

## 2024-03-04 HISTORY — PX: ESOPHAGOGASTRODUODENOSCOPY: SHX5428

## 2024-03-04 MED ORDER — PROPOFOL 10 MG/ML IV BOLUS
INTRAVENOUS | Status: DC | PRN
  Administered 2024-03-04: 40 mg via INTRAVENOUS

## 2024-03-04 MED ORDER — PHENYLEPHRINE 80 MCG/ML (10ML) SYRINGE FOR IV PUSH (FOR BLOOD PRESSURE SUPPORT)
PREFILLED_SYRINGE | INTRAVENOUS | Status: DC | PRN
  Administered 2024-03-04 (×2): 160 ug via INTRAVENOUS

## 2024-03-04 MED ORDER — SODIUM CHLORIDE 0.9 % IV SOLN: INTRAVENOUS | Status: DC | PRN

## 2024-03-04 MED ORDER — PROPOFOL 1000 MG/100ML IV EMUL
INTRAVENOUS | Status: AC
  Filled 2024-03-04: qty 100

## 2024-03-04 MED ORDER — SODIUM CHLORIDE 0.9 % IV SOLN: INTRAVENOUS | Status: DC

## 2024-03-04 MED ORDER — PROPOFOL 500 MG/50ML IV EMUL
INTRAVENOUS | Status: DC | PRN
  Administered 2024-03-04: 140 ug/kg/min via INTRAVENOUS

## 2024-03-04 MED ORDER — PROPOFOL 500 MG/50ML IV EMUL
INTRAVENOUS | Status: AC
  Filled 2024-03-04: qty 50

## 2024-03-04 MED ORDER — PROPOFOL 10 MG/ML IV BOLUS: INTRAVENOUS | Status: DC | PRN

## 2024-03-04 NOTE — Anesthesia Postprocedure Evaluation (Signed)
"   Anesthesia Post Note  Patient: Jill Franco  Procedure(s) Performed: EGD (ESOPHAGOGASTRODUODENOSCOPY)     Patient location during evaluation: PACU Anesthesia Type: MAC Level of consciousness: awake and alert Pain management: pain level controlled Vital Signs Assessment: post-procedure vital signs reviewed and stable Respiratory status: spontaneous breathing, nonlabored ventilation, respiratory function stable and patient connected to nasal cannula oxygen Cardiovascular status: stable and blood pressure returned to baseline Postop Assessment: no apparent nausea or vomiting Anesthetic complications: no   No notable events documented.  Last Vitals:  Vitals:   03/04/24 0930 03/04/24 0940  BP: (!) 242/90 (!) 249/100  Pulse: 66 67  Resp: (!) 29 18  Temp:    SpO2: 100% 98%    Last Pain:  Vitals:   03/04/24 0940  TempSrc:   PainSc: 0-No pain                 [name]      "

## 2024-03-04 NOTE — Telephone Encounter (Signed)
 FYI Only or Action Required?: Action required by provider: request for appointment.  Patient was last seen in primary care on 02/19/2024 by Corwin Antu, FNP.  Called Nurse Triage reporting Hypertension.  Symptoms began today.  Interventions attempted: Nothing.  Symptoms are: stable.  Triage Disposition: See HCP Within 4 Hours (Or PCP Triage)  Patient/caregiver understands and will follow disposition?: Yes  Reason for Disposition  [1] Systolic BP >= 200 OR Diastolic >= 120 AND [2] having NO cardiac or neurologic symptoms  Answer Assessment - Initial Assessment Questions Pt's daughter Romero calling in today. Was seen in outpatient today ESOPHAGOGASTRODUODENOSCOPY. reports elevated BP and was told anesthesia could put something in her IV. Was advised to make appointment with PCP. No available appointments within dispo, patient denies any cardio or neurological symptoms. ED precautions given, Romero verbalized understanding. please advise. Romero requesting a call back [phone]  1. BLOOD PRESSURE: What is your blood pressure? Did you take at least two measurements 5 minutes apart?     208/96  2. ONSET: When did you take your blood pressure?     5 minutes ago  3. HOW: How did you take your blood pressure? (e.g., automatic home BP monitor, visiting nurse)     Automatic BP monitor  4. HISTORY: Do you have a history of high blood pressure?     Yes  5. OTHER SYMPTOMS: Do you have any symptoms? (e.g., blurred vision, chest pain, difficulty breathing, headache, weakness)     Denies  Protocols used: Blood Pressure - High-A-AH  Copied from CRM #[id number]. Topic: Clinical - Red Word Triage >> Mar 04, 2024 11:10 AM Charolett L wrote: [facility] that prompted transfer to Nurse Triage: Patient daughter called and stated that her mothers pressure has been running high.  208/96

## 2024-03-04 NOTE — H&P (Signed)
 "   GASTROENTEROLOGY PROCEDURE H&P NOTE   Primary Care Physician: Cleatus Arlyss RAMAN, MD    Reason for Procedure:   IDA, melena  Plan:    EGD  Patient is appropriate for endoscopic procedure(s) in the ambulatory (hospital) setting.  The nature of the procedure, as well as the risks, benefits, and alternatives were carefully and thoroughly reviewed with the patient. Ample time for discussion and questions allowed. The patient understood, was satisfied, and agreed to proceed.     HPI: Jill Franco is a 89 y.o. female who presents for EGD for IDA and melena. She was last seen in GI clinic on 02/05/24. Denies dysphagia. Denies ab pain.  Past Medical History:  Diagnosis Date   Cataract    bilaterally corrected.   COPD (chronic obstructive pulmonary disease) (HCC)    CVA (cerebral vascular accident) (HCC)    Pt was unaware and does not know when it happened.  Was told after some testing was done.   Double vision    Dyspnea    Facial droop 10/2017   left   History of shingles 2012   HLD (hyperlipidemia)    HTN (hypertension)    Hypothyroidism    PVD (peripheral vascular disease)    Vision loss    temporary    Past Surgical History:  Procedure Laterality Date   ABDOMINAL AORTOGRAM N/A 04/30/2021   Procedure: ABDOMINAL AORTOGRAM;  Surgeon: Magda Debby SAILOR, MD;  Location: [facility];  Service: Cardiovascular;  Laterality: N/A;   ABDOMINAL EXPLORATION SURGERY  1950s   they thought I was pregnant; went in to explore   APPENDECTOMY  childhood   BACK SURGERY     BREAST LUMPECTOMY Left 1960's   benign   carotid ultrasound  06/19/2007   0-39% stenosis   CATARACT EXTRACTION W/ INTRAOCULAR LENS IMPLANT Left 09/2016   CATARACT EXTRACTION W/ INTRAOCULAR LENS IMPLANT Right 10/2016   COLONOSCOPY WITH PROPOFOL  N/A 04/08/2019   Procedure: COLONOSCOPY WITH PROPOFOL ;  Surgeon: Teressa Toribio SQUIBB, MD;  Location: [facility];  Service: Endoscopy;  Laterality: N/A;   DILATION AND  CURETTAGE OF UTERUS  1960's X 3   miscarriages   DOPPLER ECHOCARDIOGRAPHY  06/19/2007   Normal EF 55-70%   ENDARTERECTOMY FEMORAL Left 12/03/2020   Procedure: LEFT FEMORAL ENDARTERECTOMY AND PROFUNDAPLASTY;  Surgeon: Magda Debby SAILOR, MD;  Location: [facility];  Service: Vascular;  Laterality: Left;   ILIAC ARTERY STENT Left 03/18/2016   common iliac stent (6 x 40), aortic stent (10 x 19)/notes 03/18/2016   INSERTION OF ILIAC STENT Left 12/03/2020   Procedure: AORTIC STENTING, INSERTION OF BILATERAL COMMON ILIAC STENTS, AND LEFT EXTERNAL ILIAC STENT;  Surgeon: Magda Debby SAILOR, MD;  Location: [facility];  Service: Vascular;  Laterality: Left;   LAMINECTOMY AND MICRODISCECTOMY SPINE  1960's X 2   LOWER EXTREMITY ANGIOGRAM N/A 12/03/2020   Procedure: REUBIN BILE;  Surgeon: Magda Debby SAILOR, MD;  Location: [facility];  Service: Vascular;  Laterality: N/A;   MOLE REMOVAL  07/10/2016   NSVD     x1   PATCH ANGIOPLASTY Left 12/03/2020   Procedure: PATCH ANGIOPLASTY OF LEFT COMMON FEMORAL ARTERY USING BOVINE PERICARDIUM PATCH;  Surgeon: Magda Debby SAILOR, MD;  Location: [facility];  Service: Vascular;  Laterality: Left;   PERIPHERAL VASCULAR BALLOON ANGIOPLASTY Right 04/30/2021   Procedure: PERIPHERAL VASCULAR BALLOON ANGIOPLASTY;  Surgeon: Magda Debby SAILOR, MD;  Location: [facility];  Service: Cardiovascular;  Laterality: Right;  rt common iliac  PERIPHERAL VASCULAR CATHETERIZATION N/A 03/18/2016   Procedure: Abdominal Aortogram w/Lower Extremity;  Surgeon: Carlin FORBES Haddock, MD;  Location: [facility];  Service: Cardiovascular;  Laterality: N/A;   PERIPHERAL VASCULAR CATHETERIZATION  03/18/2016   Procedure: Peripheral Vascular Intervention;  Surgeon: Carlin FORBES Haddock, MD;  Location: [facility] INVASIVE CV LAB;  Service: Cardiovascular;;   PERIPHERAL VASCULAR INTERVENTION Left 04/30/2021   Procedure: PERIPHERAL VASCULAR INTERVENTION;  Surgeon: Magda Debby SAILOR, MD;  Location: [facility];  Service:  Cardiovascular;  Laterality: Left;  lt common iliac   POLYPECTOMY  04/08/2019   Procedure: POLYPECTOMY;  Surgeon: Teressa Toribio SQUIBB, MD;  Location: [facility];  Service: Endoscopy;;   ULTRASOUND GUIDANCE FOR VASCULAR ACCESS Right 12/03/2020   Procedure: ULTRASOUND GUIDANCE FOR VASCULAR ACCESS, RIGHT FEMORAL ARTERY;  Surgeon: Magda Debby SAILOR, MD;  Location: [facility];  Service: Vascular;  Laterality: Right;    Prior to Admission medications  Medication Sig Start Date End Date Taking? Authorizing Provider  diclofenac  Sodium (VOLTAREN ) 1 % GEL Apply 2 g topically 4 (four) times daily. 01/21/22  Yes Cleatus Arlyss RAMAN, MD  enalapril  (VASOTEC ) 10 MG tablet Take 10 mg by mouth daily.   Yes [provider]  furosemide  (LASIX ) 20 MG tablet Take one po every day for five days, then take one po every day prn edema 02/13/24  Yes Dugal, Tabitha, FNP  ipratropium-albuterol  (DUONEB) 0.5-2.5 (3) MG/3ML SOLN Take 3 mLs by nebulization every 6 (six) hours as needed. 03/22/23  Yes Ghimire, Donalda HERO, MD  levothyroxine  (SYNTHROID ) 50 MCG tablet TAKE 1 TABLET BY MOUTH DAILY  EXCEPT 2 TABLETS ON SUNDAYS AND  WEDNESDAYS ( TOTAL 9 TABS PER  WEEK) 04/07/23  Yes Cleatus Arlyss RAMAN, MD  acetaminophen  (TYLENOL ) 650 MG CR tablet Take 650 mg by mouth every 8 (eight) hours as needed for pain.    [provider]  albuterol  (VENTOLIN  HFA) 108 (90 Base) MCG/ACT inhaler USE 2 INHALATIONS BY MOUTH EVERY 6 HOURS AS NEEDED FOR WHEEZING  OR SHORTNESS OF BREATH 07/14/23   Cleatus Arlyss RAMAN, MD  alendronate  (FOSAMAX ) 70 MG tablet TAKE 1 TABLET BY MOUTH WEEKLY  WITH 8 OZ OF PLAIN WATER 30  MINUTES BEFORE FIRST FOOD, DRINK OR MEDS. STAY UPRIGHT FOR 30  MINS 02/06/24   Cleatus Arlyss RAMAN, MD  aspirin  EC 81 MG tablet Take 1 tablet (81 mg total) by mouth daily. Swallow whole. 03/29/21   Cantwell, Celeste C, PA-C  Cholecalciferol  (VITAMIN D3) 25 MCG (1000 UT) CAPS Take 1 capsule (1,000 Units total) by mouth daily. 10/25/20   Cleatus Arlyss RAMAN,  MD  cyanocobalamin  (VITAMIN B12) 1000 MCG tablet Take 1 tablet (1,000 mcg total) by mouth daily. 01/21/24   Cleatus Arlyss RAMAN, MD  Iron , Ferrous Sulfate , 325 (65 Fe) MG TABS Take 325 mg by mouth daily. 01/21/24   Cleatus Arlyss RAMAN, MD  meclizine  (ANTIVERT ) 12.5 MG tablet Take 1 tablet (12.5 mg total) by mouth 3 (three) times daily as needed for dizziness. 02/13/24   Dugal, Tabitha, FNP  melatonin 5 MG TABS Take 5 mg by mouth at bedtime as needed (For sleep).    [provider]  methocarbamol  (ROBAXIN ) 500 MG tablet TAKE 1 TABLET BY MOUTH TWICE DAILY AS NEEDED FOR MUSCLE SPASM 09/13/23   Cleatus Arlyss RAMAN, MD  pantoprazole  (PROTONIX ) 40 MG tablet Take 1 tablet (40 mg total) by mouth 2 (two) times daily. 01/16/24   Cleatus Arlyss RAMAN, MD  rivaroxaban  (XARELTO ) 2.5 MG TABS tablet Held  as of 01/24/24 02/19/24   Dugal, Tabitha, FNP  rosuvastatin  (CRESTOR ) 20 MG tablet TAKE 1 TABLET BY MOUTH DAILY 02/19/24   Cleatus Arlyss RAMAN, MD  SYMBICORT  160-4.5 MCG/ACT inhaler USE 2 INHALATIONS BY MOUTH IN  THE MORNING AND AT BEDTIME .  RINSE AFTER USE 12/10/23   Cleatus Arlyss RAMAN, MD  Tiotropium Bromide  (SPIRIVA  HANDIHALER) 18 MCG CAPS Place 1 Inhalation into inhaler and inhale daily. INHALE THE CONTENTS OF 1 CAPSULE BY MOUTH VIA INHALATION DEVICE  DAILY 02/01/24   Cleatus Arlyss RAMAN, MD    Current Facility-Administered Medications  Medication Dose Route Frequency Provider Last Rate Last Admin   0.9 %  sodium chloride  infusion   Intravenous Continuous Craig Palma R, PA-C        Allergies as of 02/05/2024 - Review Complete 02/05/2024  Allergen Reaction Noted   Lipitor [atorvastatin ] Other (See Comments) 01/19/2016   Advil [ibuprofen] Nausea And Vomiting 06/18/2020   Aspirin  Other (See Comments) 06/14/2007   Codeine Nausea Only and Other (See Comments) 07/18/2006   Other Itching and Rash 11/18/2020    Family History  Problem Relation Age of Onset   Stroke Father 5   Cancer Brother    Colon cancer Neg  Hx    Breast cancer Neg Hx    Colon polyps Neg Hx    Esophageal cancer Neg Hx    Rectal cancer Neg Hx    Stomach cancer Neg Hx     Social History   Socioeconomic History   Marital status: Widowed    Spouse name: Not on file   Number of children: 1   Years of education: Not on file   Highest education level: Not on file  Occupational History   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  Tobacco Use   Smoking status: Former    Current packs/day: 0.00    Average packs/day: 0.3 packs/day for 69.0 years (17.3 ttl pk-yrs)    Types: Cigarettes    Start date: 01/1952    Quit date: 01/2021    Years since quitting: 3.1    Passive exposure: Current   Smokeless tobacco: Never  Vaping Use   Vaping status: Never Used  Substance and Sexual Activity   Alcohol use: No    Alcohol/week: 0.0 standard drinks of alcohol   Drug use: No   Sexual activity: Not on file  Other Topics Concern   Not on file  Social History Narrative   From [place]   Retired 2007   Widowed 2008 after 49 years   Enjoys yard work, but needs some help with yard work now.     Her daughter is helping at home some (some as of 2022).  Her daughter has vision loss and is widowed as of 2022 and moved in with patient.   Social Drivers of Health   Tobacco Use: Medium Risk (03/04/2024)   Patient History    Smoking Tobacco Use: Former    Smokeless Tobacco Use: Never    Passive Exposure: Current  Physicist, Medical Strain: Low Risk (07/19/2023)   Overall Financial Resource Strain (CARDIA)    Difficulty of Paying Living Expenses: Not hard at all  Food Insecurity: No Food Insecurity (07/19/2023)   Hunger Vital Sign    Worried About Running Out of Food in the Last Year: Never true    Ran Out of Food in the Last Year: Never true  Transportation Needs: No Transportation Needs (07/19/2023)   PRAPARE - Transportation    Lack  of Transportation (Medical): No    Lack of Transportation (Non-Medical): No   Physical Activity: Insufficiently Active (07/19/2023)   Exercise Vital Sign    Days of Exercise per Week: 3 days    Minutes of Exercise per Session: 30 min  Stress: No Stress Concern Present (07/19/2023)   [employer] of Occupational Health - Occupational Stress Questionnaire    Feeling of Stress : Not at all  Social Connections: Socially Isolated (07/19/2023)   Social Connection and Isolation Panel    Frequency of Communication with Friends and Family: More than three times a week    Frequency of Social Gatherings with Friends and Family: More than three times a week    Attends Religious Services: Never    Database Administrator or Organizations: No    Attends Banker Meetings: Never    Marital Status: Widowed  Intimate Partner Violence: Not At Risk (07/19/2023)   Humiliation, Afraid, Rape, and Kick questionnaire    Fear of Current or Ex-Partner: No    Emotionally Abused: No    Physically Abused: No    Sexually Abused: No  Depression (PHQ2-9): Low Risk (02/01/2024)   Depression (PHQ2-9)    PHQ-2 Score: 0  Alcohol Screen: Low Risk (07/19/2023)   Alcohol Screen    Last Alcohol Screening Score (AUDIT): 0  Housing: Low Risk (07/19/2023)   Housing Stability Vital Sign    Unable to Pay for Housing in the Last Year: No    Number of Times Moved in the Last Year: 0    Homeless in the Last Year: No  Utilities: Not At Risk (07/19/2023)   AHC Utilities    Threatened with loss of utilities: No  Health Literacy: Adequate Health Literacy (07/19/2023)   B1300 Health Literacy    Frequency of need for help with medical instructions: Never    Physical Exam: Vital signs in last 24 hours: BP (!) 270/88 Comment: MDA notified  Temp 98 F (36.7 C) (Temporal)   Resp 20   Ht 5' 4 (1.626 m)   Wt 50.3 kg   SpO2 97%   BMI 19.05 kg/m  GEN: NAD EYE: Sclerae anicteric ENT: MMM CV: Non-tachycardic Pulm: No increased work of breathing GI: Soft, NT/ND NEURO:  Alert &  Oriented   Estefana Kidney, MD [facility]  03/04/2024 8:25 AM  "

## 2024-03-04 NOTE — Telephone Encounter (Signed)
 Here BP was clearly lower last week.    Did she hold meds prior to EGD?    Did she have the EGD completed?    Was the listed BP prior to or after EGD?    Please let me know.  Thanks.

## 2024-03-04 NOTE — Telephone Encounter (Signed)
 Since her BP is better, I wouldn't change meds yet.  I would still continue as is with baseline meds and recheck BP a few times over the next few days.  If still elevated, then let us  know. Thanks.

## 2024-03-04 NOTE — Discharge Instructions (Signed)

## 2024-03-04 NOTE — Telephone Encounter (Signed)
 Spoke with Romero (Daughter and on [place]) and she repeated and understood the message.

## 2024-03-04 NOTE — Telephone Encounter (Signed)
 Spoke with her daughter Romero ([place])  Did she hold meds prior to EGD?  Pt was told to not take her meds prior EGD   Did she have the EGD completed?  Yes   Was the listed BP prior to or after EGD?  After. When patient got home, she took her BP again and it was 149/78. (At 12:45pm) While on the phone with me she took it and it was 162/85   Please advise

## 2024-03-04 NOTE — Op Note (Signed)
 [facility] Patient Name: Jill Franco Procedure Date: 03/04/2024 MRN: [id number] Attending MD: Rosario Estefana Kidney , , [id number] Date of Birth: [birth year] CSN: [id number] Age: 89 Admit Type: Ambulatory Procedure:                Upper GI endoscopy Indications:              Iron  deficiency anemia Providers:                Rosario Estefana Kidney, Darleene Bare, RN, [place], Technician Referring MD:             Arlyss RAMAN. Cleatus, MD Medicines:                Monitored Anesthesia Care Complications:            No immediate complications. Estimated Blood Loss:     Estimated blood loss was minimal. Procedure:                Pre-Anesthesia Assessment:                           - Prior to the procedure, a History and Physical                            was performed, and patient medications and                            allergies were reviewed. The patient's tolerance of                            previous anesthesia was also reviewed. The risks                            and benefits of the procedure and the sedation                            options and risks were discussed with the patient.                            All questions were answered, and informed consent                            was obtained. Prior Anticoagulants: The patient has                            taken Xarelto  (rivaroxaban ), last dose was 4 days                            prior to procedure. ASA Grade Assessment: III - A                            patient with severe systemic disease. After  reviewing the risks and benefits, the patient was                            deemed in satisfactory condition to undergo the                            procedure.                           After obtaining informed consent, the endoscope was                            passed under direct vision. Throughout the                            procedure, the  patient's blood pressure, pulse, and                            oxygen saturations were monitored continuously. The                            [id number]-H190 ([id number]) Olympus endoscope was introduced                            through the mouth, and advanced to the second part                            of duodenum. The upper GI endoscopy was                            accomplished without difficulty. The patient                            tolerated the procedure well. Scope In: Scope Out: Findings:      The examined esophagus was normal.      Two 3 to 6 mm angioectasias with no bleeding were found in the gastric       body. Coagulation for bleeding prevention using argon plasma at 2       liters/minute and 20 watts was successful.      A single 12 mm sessile polyp with no bleeding was found in the second       portion of the duodenum. Preparations were made for mucosal resection.       EverLift was injected to raise the lesion. Piecemeal mucosal resection       using a snare was performed. Resection and retrieval were complete. To       prevent bleeding after the polypectomy, two hemostatic clips were       successfully placed. There was no bleeding at the end of the procedure. Impression:               - Normal esophagus.                           - Two non-bleeding angioectasias in the stomach.  Treated with argon plasma coagulation (APC).                           - A single duodenal polyp. Resected and retrieved.                            Clips were placed.                           - Mucosal resection was performed. Resection and                            retrieval were complete. Moderate Sedation:      Not Applicable - Patient had care per Anesthesia. Recommendation:           - Discharge patient to home (with escort).                           - Await pathology results.                           - Trend blood counts.                           - Okay to  restart Xarelto  in 2 days.                           - The findings and recommendations were discussed                            with the patient. Procedure Code(s):        --- Professional ---                           [phone], Esophagogastroduodenoscopy, flexible,                            transoral; with endoscopic mucosal resection                           [id number], 59, Esophagogastroduodenoscopy, flexible,                            transoral; with control of bleeding, any method Diagnosis Code(s):        --- Professional ---                           K31.819, Angiodysplasia of stomach and duodenum                            without bleeding                           K31.7, Polyp of stomach and duodenum                           D50.9, Iron  deficiency anemia, unspecified CPT copyright 2022  American Medical Association. All rights reserved. The codes documented in this report are preliminary and upon coder review may  be revised to meet current compliance requirements. Dr Estefana Federico Rosario Estefana Federico,  03/04/2024 9:35:56 AM Number of Addenda: 0

## 2024-03-04 NOTE — Progress Notes (Signed)
 Pt presented to endo today for EGD with Dr Federico. Pre-operatively, pt was markedly hypertensive with BP of 270/88. MDA Hollis assessed pt at that time. Post-operatively, pt remains markedly hypertensive with SBP in the 240s and 250s. MDA Hollis notified x2. Per MDA Hollis, pt at baseline BP, okay to discharge with instructions to take home BP meds and follow up with PCP. Educated pt and family member on need to follow up with PCP and take BP meds. Pt's daughter states she will call PCP today for appt.  Ozell VEAR Pouch, RN 03/04/2024 9:54 AM

## 2024-03-04 NOTE — Anesthesia Preprocedure Evaluation (Addendum)
"                                    Anesthesia Evaluation  Patient identified by MRN, date of birth, ID band Patient awake    Reviewed: Allergy & Precautions, NPO status , Patient's Chart, lab work & pertinent test results  Airway Mallampati: I  TM Distance: >3 FB Neck ROM: Full    Dental  (+) Upper Dentures, Dental Advisory Given   Pulmonary COPD,  COPD inhaler, former smoker   breath sounds clear to auscultation       Cardiovascular hypertension, Pt. on medications + Peripheral Vascular Disease   Rhythm:Regular Rate:Normal     Neuro/Psych  PSYCHIATRIC DISORDERS      CVA, Residual Symptoms    GI/Hepatic Neg liver ROS,GERD  Medicated,,  Endo/Other  Hypothyroidism    Renal/GU negative Renal ROS     Musculoskeletal negative musculoskeletal ROS (+)    Abdominal   Peds  Hematology  (+) Blood dyscrasia, anemia   Anesthesia Other Findings   Reproductive/Obstetrics                              Anesthesia Physical Anesthesia Plan  ASA: 3  Anesthesia Plan: MAC   Post-op Pain Management: Minimal or no pain anticipated   Induction: Intravenous  PONV Risk Score and Plan: 0 and Propofol  infusion  Airway Management Planned: Natural Airway and Simple Face Mask  Additional Equipment: None  Intra-op Plan:   Post-operative Plan:   Informed Consent: I have reviewed the patients History and Physical, chart, labs and discussed the procedure including the risks, benefits and alternatives for the proposed anesthesia with the patient or authorized representative who has indicated his/her understanding and acceptance.       Plan Discussed with: CRNA  Anesthesia Plan Comments:          Anesthesia Quick Evaluation  "

## 2024-03-04 NOTE — Transfer of Care (Signed)
 Immediate Anesthesia Transfer of Care Note  Patient: Jill Franco  Procedure(s) Performed: EGD (ESOPHAGOGASTRODUODENOSCOPY)  Patient Location: PACU  Anesthesia Type:MAC  Level of Consciousness: awake  Airway & Oxygen Therapy: Patient Spontanous Breathing and Patient connected to face mask oxygen  Post-op Assessment: Report given to RN and Post -op Vital signs reviewed and stable  Post vital signs: Reviewed and stable  Last Vitals:  Vitals Value Taken Time  BP 242/90 03/04/24 09:30  Temp 36.3 C 03/04/24 09:27  Pulse 51 03/04/24 09:32  Resp 27 03/04/24 09:32  SpO2 100 % 03/04/24 09:32  Vitals shown include unfiled device data.  Last Pain:  Vitals:   03/04/24 0927  TempSrc: Temporal  PainSc: Asleep         Complications: No notable events documented.

## 2024-03-05 ENCOUNTER — Telehealth: Payer: Self-pay | Admitting: Internal Medicine

## 2024-03-05 ENCOUNTER — Ambulatory Visit: Payer: Self-pay | Admitting: Internal Medicine

## 2024-03-05 LAB — SURGICAL PATHOLOGY

## 2024-03-05 NOTE — Telephone Encounter (Signed)
 Inbound call from patient daughter stating that she is needing for someone to call her back in regards to her mother procedure yesterday. Patient has an EGD and the patient daughter is wanting someone to explain what is seen in the pictures. Patient daughter is requesting a call back. Please advise.

## 2024-03-06 ENCOUNTER — Encounter (HOSPITAL_COMMUNITY): Payer: Self-pay | Admitting: Internal Medicine

## 2024-03-06 NOTE — Telephone Encounter (Signed)
 Spoke with patient's daughter, Romero.  Reviewed EGD notes and advised polyp results should be back end of this week and Dr Lafonda recommendations end of next week. Concluded call with all  questions answered to satisfaction.  Will call if any further concerns.

## 2024-03-08 ENCOUNTER — Encounter: Payer: Self-pay | Admitting: Cardiology

## 2024-03-08 ENCOUNTER — Ambulatory Visit: Admitting: Cardiology

## 2024-03-08 VITALS — BP 130/62 | HR 69 | Resp 16 | Ht 64.0 in | Wt 112.2 lb

## 2024-03-08 DIAGNOSIS — I739 Peripheral vascular disease, unspecified: Secondary | ICD-10-CM

## 2024-03-08 DIAGNOSIS — I10 Essential (primary) hypertension: Secondary | ICD-10-CM

## 2024-03-08 DIAGNOSIS — Z01810 Encounter for preprocedural cardiovascular examination: Secondary | ICD-10-CM

## 2024-03-08 DIAGNOSIS — E7800 Pure hypercholesterolemia, unspecified: Secondary | ICD-10-CM | POA: Diagnosis not present

## 2024-03-08 NOTE — Progress Notes (Signed)
 No, demarcation is not standard for duodenal polyps.

## 2024-03-08 NOTE — Progress Notes (Unsigned)
 " Cardiology Office Note:  .   Date:  03/08/2024  ID:  Jill Franco, DOB 1935/01/12, MRN [id number] PCP: Cleatus Arlyss RAMAN, MD  [facility] Providers Cardiologist:  None { Click to update primary MD,subspecialty MD or APP then REFRESH:1}  History of Present Illness: .   Jill Franco is a 89 y.o. Caucasian female with history of supine hypertension, hyperlipidemia, orthostatic hypotension, PAD, tobacco use (smokes about half pack per day quit in December 2022), patient reported history of CVA (unclear when), COPD, and hypothyroidism.  Last evaluated by us  on 07/21/2021 for preoperative cardiac risk stratification for left femoral endarterectomy and retrograde iliac stenting.   Now status post left femoral endarterectomy and profundaplasty, complete endovascular reconstruction of aortic bifurcation (CERAB) 12/03/20. This required endovascular revision 04/30/21 for mechanical compression of the iliac stenting.   Significant studies include a carotid artery duplex on 04/26/2019 revealing left ICA 40 to 59% stenosis, >50% stenosis of right CIA and 50 to 99% stenosis of the proximal EIA distal to CIA stent, echocardiogram 11/18/2020 revealing normal LVEF with grade 1 diastolic dysfunction and a negative nuclear stress test on 11/25/2020.  Now referred back to me for evaluation of endovascular revascularization for bilateral SFA disease due to severe lifestyle-limiting claudication, patient evaluated by Dr. Debby Robertson and he plans to do procedure with general anesthesia. ABI on 12/26/2023 was 0.69 on the right and 0.60 on the left with monophasic waveform pattern and left mid iliac artery stent showing 50 to 99% stenosis with a peak velocity of 362 cm/s and patent right iliac stent.    Discussed the use of AI scribe software for clinical note transcription with the patient, who gave verbal consent to proceed.  History of Present Illness   Cardiac Studies relevent.   Lexiscan  Tetrofosmin  stress test 11/25/2020: Lexiscan  nuclear stress test performed using 1-day protocol. LV cavity is small. Normal myocardial perfusion. Stress LVEF 51%. TID is 1.30. In absence of regional perfusion abnormalities, this is a nonspecific finding. Recommend clinical correlation.  ECHOCARDIOGRAM COMPLETE 03/19/2023 . Left ventricular ejection fraction, by estimation, is >75%. The left ventricle has hyperdynamic function. The left ventricle has no regional wall motion abnormalities. Left ventricular diastolic parameters are consistent with Grade I diastolic dysfunction (impaired relaxation). 2. Right ventricular systolic function is normal. The right ventricular size is normal. 3. The mitral valve is normal in structure. No evidence of mitral valve regurgitation. No evidence of mitral stenosis. 4. The aortic valve is tricuspid. Aortic valve regurgitation is not visualized. Aortic valve sclerosis is present, with no evidence of aortic valve stenosis. 5. The inferior vena cava is normal in size with greater than 50% respiratory variability, suggesting right atrial pressure of 3 mmHg.  EKG:   EKG Interpretation Date/Time:  Friday March 08 2024 13:42:43 EST Ventricular Rate:  77 PR Interval:  234 QRS Duration:  72 QT Interval:  368 QTC Calculation: 416 R Axis:   47  Text Interpretation: EKG 03/08/2024: Sinus rhythm with first-degree AV block at rate of 77 bpm, LVH with repolarization abnormality with sagging ST depression in lateral leads and voltage, frequent PACs in trigeminal pattern.  Compared to 03/17/2023, lateral ST changes new but otherwise no significant change. Confirmed by Milayna Rotenberg, Jagadeesh [phone]) on 03/08/2024 1:52:01 PM  Labs   Lab Results  Component Value Date   CHOL 183 11/21/2022   HDL 99.90 11/21/2022   LDLCALC 64 11/21/2022   LDLDIRECT 115.8 06/19/2012   TRIG 97.0 11/21/2022   CHOLHDL 2  11/21/2022   No results found for: LIPOA  Recent Labs    03/20/23 0503 03/21/23 0442  03/22/23 0447 03/27/23 0845 01/16/24 1040 02/01/24 1229 02/13/24 0848 02/19/24 1037  NA 141 142 141   < > 140 137 139  --   K 3.9 3.6 3.5   < > 4.3 3.9 4.2 3.8  CL 103 107 107   < > 106 100 104  --   CO2 26 25 26    < > 30 31 26   --   GLUCOSE 139* 132* 119*   < > 92 121* 80  --   BUN 33* 31* 29*   < > 15 17 18   --   CREATININE 0.83 0.65 0.65   < > 0.81 0.82 0.84  --   CALCIUM  8.9 8.7* 8.5*   < > 9.2 9.5 9.1  --   GFRNONAA >60 >60 >60  --   --   --   --   --    < > = values in this interval not displayed.    Lab Results  Component Value Date   ALT 13 01/04/2024   AST 17 01/04/2024   ALKPHOS 67 01/04/2024   BILITOT 0.5 01/04/2024      Latest Ref Rng & Units 02/19/2024   10:39 AM 02/13/2024    8:48 AM 02/01/2024   12:29 PM  CBC  WBC 4.0 - 10.5 K/uL 4.7  5.2  5.2   Hemoglobin 12.0 - 15.0 g/dL 87.3  87.1  87.6   Hematocrit 36.0 - 46.0 % 40.1  41.0  38.8   Platelets 150.0 - 400.0 K/uL 204.0  207.0  208.0    Lab Results  Component Value Date   HGBA1C 5.7 09/18/2020    Lab Results  Component Value Date   TSH 0.61 02/13/2024     ROS  ***ROS Physical Exam:   VS:  BP 130/62 (BP Location: Left Arm, Patient Position: Sitting, Cuff Size: Normal)   Pulse 69   Resp 16   Ht 5' 4 (1.626 m)   Wt 112 lb 3.2 oz (50.9 kg)   SpO2 99%   BMI 19.26 kg/m    Wt Readings from Last 3 Encounters:  03/08/24 112 lb 3.2 oz (50.9 kg)  03/04/24 111 lb (50.3 kg)  02/27/24 113 lb (51.3 kg)    BP Readings from Last 3 Encounters:  03/08/24 130/62  03/04/24 (!) 249/100  02/27/24 (!) 146/67   ***Physical Exam  ASSESSMENT AND PLAN: .      ICD-10-CM   1. Preoperative cardiovascular examination  Z01.810 EKG 12-Lead    2. Claudication in peripheral vascular disease  I73.9     3. Primary hypertension  I10     4. Hypercholesterolemia  E78.00      Assessment and Plan Assessment & Plan    Follow up: ***  Signed,  Gordy Bergamo, MD, [facility] 03/08/2024, 2:00 PM [facility] [address] [place], [place] [address] Phone: [phone]. Fax:  [phone]  "

## 2024-03-08 NOTE — Patient Instructions (Signed)
 Medication Instructions:  HOLD Enalapril  and Xarelto  2 days prior to procedure.   *If you need a refill on your cardiac medications before your next appointment, please call your pharmacy*    Follow-Up: At [facility], you and your health needs are our priority.  As part of our continuing mission to provide you with exceptional heart care, our providers are all part of one team.  This team includes your primary Cardiologist (physician) and Advanced Practice Providers or APPs (Physician Assistants and Nurse Practitioners) who all work together to provide you with the care you need, when you need it.  Your next appointment:   As Needed   Provider:   Gordy Bergamo, MD

## 2024-03-11 ENCOUNTER — Other Ambulatory Visit: Payer: Self-pay | Admitting: Family Medicine

## 2024-03-11 DIAGNOSIS — E039 Hypothyroidism, unspecified: Secondary | ICD-10-CM

## 2024-03-13 ENCOUNTER — Telehealth: Payer: Self-pay

## 2024-03-13 NOTE — Telephone Encounter (Signed)
 SABRA

## 2024-03-13 NOTE — Telephone Encounter (Signed)
 Path Letter sent to pt via mail.  Reminder placed in Epic for scheduling pt for a 6 month office visit follow up to discuss scheduling  repeat EGD.

## 2024-03-18 ENCOUNTER — Ambulatory Visit: Admitting: Cardiology

## 2024-03-26 ENCOUNTER — Telehealth: Payer: Self-pay

## 2024-03-26 NOTE — Telephone Encounter (Signed)
 Attempted to call for surgery scheduling. No answer / No VM.

## 2024-07-19 ENCOUNTER — Ambulatory Visit
